# Patient Record
Sex: Male | Born: 1940
Health system: Southern US, Community
[De-identification: ages and names within clinical notes are randomized; demographics above are authoritative.]

## PROBLEM LIST (undated history)

## (undated) DIAGNOSIS — A159 Respiratory tuberculosis unspecified: Secondary | ICD-10-CM

## (undated) DIAGNOSIS — C801 Malignant (primary) neoplasm, unspecified: Secondary | ICD-10-CM

## (undated) DIAGNOSIS — I10 Essential (primary) hypertension: Secondary | ICD-10-CM

## (undated) DIAGNOSIS — I639 Cerebral infarction, unspecified: Secondary | ICD-10-CM

## (undated) DIAGNOSIS — M1990 Unspecified osteoarthritis, unspecified site: Secondary | ICD-10-CM

## (undated) DIAGNOSIS — I739 Peripheral vascular disease, unspecified: Secondary | ICD-10-CM

## (undated) DIAGNOSIS — B019 Varicella without complication: Secondary | ICD-10-CM

## (undated) DIAGNOSIS — F329 Major depressive disorder, single episode, unspecified: Secondary | ICD-10-CM

## (undated) DIAGNOSIS — I2510 Atherosclerotic heart disease of native coronary artery without angina pectoris: Secondary | ICD-10-CM

## (undated) DIAGNOSIS — A539 Syphilis, unspecified: Secondary | ICD-10-CM

## (undated) DIAGNOSIS — F32A Depression, unspecified: Secondary | ICD-10-CM

## (undated) DIAGNOSIS — E7800 Pure hypercholesterolemia, unspecified: Secondary | ICD-10-CM

## (undated) DIAGNOSIS — E559 Vitamin D deficiency, unspecified: Secondary | ICD-10-CM

## (undated) DIAGNOSIS — I729 Aneurysm of unspecified site: Secondary | ICD-10-CM

## (undated) DIAGNOSIS — G43909 Migraine, unspecified, not intractable, without status migrainosus: Secondary | ICD-10-CM

## (undated) DIAGNOSIS — F4310 Post-traumatic stress disorder, unspecified: Secondary | ICD-10-CM

## (undated) HISTORY — DX: Post-traumatic stress disorder, unspecified: F43.10

## (undated) HISTORY — DX: Major depressive disorder, single episode, unspecified: F32.9

## (undated) HISTORY — DX: Vitamin D deficiency, unspecified: E55.9

## (undated) HISTORY — DX: Migraine, unspecified, not intractable, without status migrainosus: G43.909

## (undated) HISTORY — PX: COLONOSCOPY: SHX5424

## (undated) HISTORY — DX: Unspecified osteoarthritis, unspecified site: M19.90

## (undated) HISTORY — DX: Depression, unspecified: F32.A

## (undated) HISTORY — DX: Varicella without complication: B01.9

## (undated) HISTORY — PX: BACK SURGERY: SHX140

## (undated) HISTORY — PX: HERNIA REPAIR: SHX51

## (undated) HISTORY — DX: Aneurysm of unspecified site: I72.9

## (undated) HISTORY — DX: Syphilis, unspecified: A53.9

## (undated) HISTORY — DX: Respiratory tuberculosis unspecified: A15.9

---

## 1975-08-26 DIAGNOSIS — M549 Dorsalgia, unspecified: Secondary | ICD-10-CM | POA: Insufficient documentation

## 1997-08-25 DIAGNOSIS — K219 Gastro-esophageal reflux disease without esophagitis: Secondary | ICD-10-CM | POA: Insufficient documentation

## 2002-04-27 ENCOUNTER — Emergency Department (HOSPITAL_COMMUNITY): Admission: EM | Admit: 2002-04-27 | Discharge: 2002-04-27 | Payer: Self-pay | Admitting: Emergency Medicine

## 2004-12-08 ENCOUNTER — Emergency Department (HOSPITAL_COMMUNITY): Admission: EM | Admit: 2004-12-08 | Discharge: 2004-12-08 | Payer: Self-pay | Admitting: Emergency Medicine

## 2004-12-19 ENCOUNTER — Ambulatory Visit: Payer: Self-pay | Admitting: Internal Medicine

## 2008-05-11 ENCOUNTER — Emergency Department (HOSPITAL_COMMUNITY): Admission: EM | Admit: 2008-05-11 | Discharge: 2008-05-11 | Payer: Self-pay | Admitting: Emergency Medicine

## 2009-11-13 ENCOUNTER — Encounter: Payer: Self-pay | Admitting: Family Medicine

## 2009-11-15 ENCOUNTER — Encounter: Payer: Self-pay | Admitting: Family Medicine

## 2009-11-19 ENCOUNTER — Ambulatory Visit: Payer: Self-pay | Admitting: Family Medicine

## 2009-11-19 ENCOUNTER — Ambulatory Visit (HOSPITAL_COMMUNITY): Admission: RE | Admit: 2009-11-19 | Discharge: 2009-11-19 | Payer: Self-pay | Admitting: Family Medicine

## 2009-11-19 DIAGNOSIS — M159 Polyosteoarthritis, unspecified: Secondary | ICD-10-CM | POA: Insufficient documentation

## 2009-11-19 DIAGNOSIS — I498 Other specified cardiac arrhythmias: Secondary | ICD-10-CM | POA: Insufficient documentation

## 2009-11-19 DIAGNOSIS — F32A Depression, unspecified: Secondary | ICD-10-CM | POA: Insufficient documentation

## 2009-11-19 DIAGNOSIS — F329 Major depressive disorder, single episode, unspecified: Secondary | ICD-10-CM

## 2009-11-19 DIAGNOSIS — I10 Essential (primary) hypertension: Secondary | ICD-10-CM | POA: Insufficient documentation

## 2009-11-19 DIAGNOSIS — F419 Anxiety disorder, unspecified: Secondary | ICD-10-CM | POA: Insufficient documentation

## 2009-11-19 DIAGNOSIS — Z8679 Personal history of other diseases of the circulatory system: Secondary | ICD-10-CM | POA: Insufficient documentation

## 2009-11-22 ENCOUNTER — Encounter: Payer: Self-pay | Admitting: Family Medicine

## 2009-12-12 ENCOUNTER — Encounter: Payer: Self-pay | Admitting: Family Medicine

## 2009-12-17 ENCOUNTER — Encounter: Payer: Self-pay | Admitting: Family Medicine

## 2009-12-17 ENCOUNTER — Ambulatory Visit: Payer: Self-pay | Admitting: Family Medicine

## 2009-12-17 LAB — CONVERTED CEMR LAB
BUN: 21 mg/dL (ref 6–23)
Calcium: 9.9 mg/dL (ref 8.4–10.5)
Chloride: 96 meq/L (ref 96–112)
Cholesterol: 219 mg/dL — ABNORMAL HIGH (ref 0–200)
Creatinine, Ser: 1.23 mg/dL (ref 0.40–1.50)
HDL: 40 mg/dL (ref >39)
LDL Cholesterol: 153 mg/dL — ABNORMAL HIGH (ref 0–99)
Triglycerides: 129 mg/dL (ref <150)

## 2009-12-24 ENCOUNTER — Encounter: Payer: Self-pay | Admitting: Family Medicine

## 2010-01-25 ENCOUNTER — Telehealth: Payer: Self-pay | Admitting: Family Medicine

## 2010-02-20 ENCOUNTER — Encounter: Payer: Self-pay | Admitting: Family Medicine

## 2010-06-11 ENCOUNTER — Encounter: Payer: Self-pay | Admitting: Family Medicine

## 2010-06-13 ENCOUNTER — Encounter: Payer: Self-pay | Admitting: Family Medicine

## 2010-06-14 ENCOUNTER — Ambulatory Visit: Payer: Self-pay | Admitting: Family Medicine

## 2010-06-14 DIAGNOSIS — F17200 Nicotine dependence, unspecified, uncomplicated: Secondary | ICD-10-CM | POA: Insufficient documentation

## 2010-06-20 ENCOUNTER — Telehealth: Payer: Self-pay | Admitting: *Deleted

## 2010-07-05 ENCOUNTER — Encounter: Payer: Self-pay | Admitting: Family Medicine

## 2010-09-14 ENCOUNTER — Encounter: Payer: Self-pay | Admitting: Orthopaedic Surgery

## 2010-09-17 ENCOUNTER — Encounter (INDEPENDENT_AMBULATORY_CARE_PROVIDER_SITE_OTHER): Payer: Self-pay | Admitting: *Deleted

## 2010-09-22 ENCOUNTER — Emergency Department (HOSPITAL_COMMUNITY)
Admission: EM | Admit: 2010-09-22 | Discharge: 2010-09-22 | Payer: Self-pay | Source: Home / Self Care | Admitting: Emergency Medicine

## 2010-09-24 NOTE — Miscellaneous (Signed)
  Clinical Lists Changes  Abd u/s:  No signs of AAA Colonoscopy (12/07/09) - adenomatous polyp, repeat colonoscopy in 3 years

## 2010-09-24 NOTE — Miscellaneous (Signed)
Summary: [facility]  Clinical Lists Changes called pt to make an appt. states he has one for tomorrow. forms to pcp chart box yo do at visit.Golden Circle RN  June 13, 2010 2:55 PM

## 2010-09-24 NOTE — Progress Notes (Signed)
Summary: refill  Phone Note Call from Patient Call back at Home Phone [phone]   Caller: Patient Summary of Call: needs another refill on Keflex for his mouth [facility]  Initial call taken by: De Nurse,  January 25, 2010 11:53 AM  Follow-up for Phone Call        to pcp Follow-up by: Golden Circle RN,  January 25, 2010 12:08 PM  Additional Follow-up for Phone Call Additional follow up Details #1::        He needs to have another office visit if he wants more antibiotics Additional Follow-up by: Angelena Sole MD,  January 28, 2010 8:50 AM    Additional Follow-up for Phone Call Additional follow up Details #2::    states he talked to the pharmacist & got suggestions for the pain & swelling. he is feeling better now. has not seen a dentist. has medicare. suggested calling many dentist until he finds one that take medicare. states he has done that. he will call back if he feels he needs the antibiotic again Follow-up by: Golden Circle RN,  January 28, 2010 9:04 AM

## 2010-09-24 NOTE — Letter (Signed)
Summary: Generic Letter  [facility]  [address]   [place], [place] [address]   Phone: [phone]  Fax: [phone]    12/24/2009  Roberto Wallace [address] [place], [place]  [address]  Dear [name],  Here is a copy of your lab results.  I think that your bad cholesterol is too high so we should start you on a cholesterol lowering medicine.  Please schedule an appointment to discuss.  Tests: (1) Basic Metabolic Panel (27253)   Sodium                    138 mEq/L                   135-145   Potassium                 4.0 mEq/L                   3.5-5.3   Chloride                  96 mEq/L                    96-112   CO2                       28 mEq/L                    19-32   Glucose              [H]  110 mg/dL                   66-44   BUN                       21 mg/dL                    0-34   Creatinine                1.23 mg/dL                  0.40-1.50   Calcium                   9.9 mg/dL                   7.4-25.9  Tests: (2) Lipid Profile (56387)   Cholesterol          [H]  219 mg/dL                   5-643     ATP III Classification:           < 200        mg/dL        Desirable          200 - 239     mg/dL        Borderline High          >= 240        mg/dL        High         Triglyceride              129 mg/dL                   <329  HDL Cholesterol           40 mg/dL                    >16   Total Chol/HDL Ratio      5.5 Ratio  VLDL Cholesterol (Calc)                             26 mg/dL                    1-09  LDL Cholesterol (Calc)                        [H]  153 mg/dL                   6-04           Total Cholesterol/HDL Ratio:CHD Risk                            Coronary Heart Disease Risk Table                                            Men       Women              1/2 Average Risk              3.4        3.3                  Average Risk              5.0        4.4              2 X Average Risk              9.6         7.1              3 X Average Risk             23.4       11.0     Use the calculated Patient Ratio above and the CHD Risk table      to determine the patient's CHD Risk.     ATP III Classification (LDL):           < 100        mg/dL         Optimal          100 - 129     mg/dL         Near or Above Optimal          130 - 159     mg/dL         Borderline High          160 - 189     mg/dL         High           > 190        mg/dL         Very High  Sincerely,   Angelena Sole MD  Appended Document: Generic Letter mailed.

## 2010-09-24 NOTE — Miscellaneous (Signed)
   Colonoscopy  Procedure date:  11/13/2009  Findings:      Results: Normal.    Complete Medication List: 1)  Naprosyn 500 Mg Tabs (Naproxen) .Marland Kitchen.. 1 tab twice a day 2)  Ranitidine Hcl 150 Mg Caps (Ranitidine hcl) .... I tab two times a day 3)  Acetaminophen 325 Mg Supp (Acetaminophen) .... 2 tabs every 8 hours as needed 4)  Chlorthalidone 25 Mg Tabs (Chlorthalidone) .... Take 1/2 tab once per day   Prevention & Chronic Care Immunizations   Influenza vaccine: Not documented    Tetanus booster: Not documented    Pneumococcal vaccine: Not documented    H. zoster vaccine: Not documented  Colorectal Screening   Hemoccult: Not documented    Colonoscopy: Results: Normal.   (11/13/2009)  Other Screening   PSA: Not documented   Smoking status: Not documented  Lipids   Total Cholesterol: Not documented   LDL: Not documented   LDL Direct: Not documented   HDL: Not documented   Triglycerides: Not documented

## 2010-09-24 NOTE — Progress Notes (Signed)
Summary: scooter store  Phone Note Other Incoming Call back at [phone]   Caller: [facility] Summary of Call: asking about paperwork for his scooter eval Initial call taken by: De Nurse,  June 20, 2010 9:57 AM  Follow-up for Phone Call        He doesn't need a scooter Follow-up by: Angelena Sole MD,  June 20, 2010 4:39 PM  Additional Follow-up for Phone Call Additional follow up Details #1::        Chrisitna informed of above, expressed understanding Additional Follow-up by: Garen Grams LPN,  June 21, 2010 10:47 AM

## 2010-09-24 NOTE — Miscellaneous (Signed)
  Clinical Lists Changes  Prior Notes from [place]  1. Ultrasound AAA exam:  no evidence of AAA 2. CXR: No active disease 3. CBC: 6.3>15.0<216 4. Creatinine: 0.9 5. Colonscopy: colon polyp negative for dysplasia - repeat in 3 years 6. Daily headaches: referred for brain MRI and Ophto exam 7. MDD: Citalopram 8. Nerve conduction studies: looking for radiculopathy were negative 9. OA of bilateral knees

## 2010-09-24 NOTE — Consult Note (Signed)
Summary: [facility]   Imported By: Bradly Bienenstock 11/29/2009 09:22:56  _____________________________________________________________________  External Attachment:    Type:   Image     Comment:   External Document

## 2010-09-24 NOTE — Assessment & Plan Note (Signed)
Summary: f/u,df   Vital Signs:  Patient profile:   70 year old male Height:      69.25 inches Weight:      113 pounds BMI:     16.63 Temp:     97.8 degrees F Pulse rate:   80 / minute BP sitting:   124 / 78  (left arm) Cuff size:   regular  Vitals Entered By: Tessie Fass CMA (June 14, 2010 1:45 PM) CC: F/U Is Patient Diabetic? No Pain Assessment Patient in pain? yes        CC:  F/U.  History of Present Illness: 1. HTN: He is taking his medicine as prescribed.  He doesn't check his blood pressure regularly.  ROS: denies chest pain, shortness of breath.  Endorses headaches.  2. Back pain:  Has been to see Dr. Noel Gerold [facility].   Their evaluation showed severe cervical spondylosis with chronic neck pain.  Significant left tricep weakness.  Mild lumbar spondylosis with moderate to severe back pain.  Profound hip flexor weakness.  Gait and balance disturbances.  Order MRI of the cervical spine to r/o myelopathy.  Order MRI of the lumbar spine.  He is following up with them in a couple of weeks.  3. Knee pain:  Continues to have knee pain.  Asking for a motorized scooter.  He is able to walk around with the use of a cane.  Would like something stronger for pain.  Right now he is only taking Ibuprofen and Tylenol.  4. Tobacco use:  Pt continues to smoke.  Not interested in quitting.  ROS: denies chronic cough  Habits & Providers  Alcohol-Tobacco-Diet     Tobacco Status: current     Tobacco Counseling: to quit use of tobacco products     Cigarette Packs/Day: 0.25  Current Medications (verified): 1)  Ibuprofen 400 Mg Tabs (Ibuprofen) .Marland Kitchen.. 1 Tab By Mouth Every 8 Hours As Needed For Pain 2)  Ranitidine Hcl 150 Mg Caps (Ranitidine Hcl) .... I Tab Two Times A Day 3)  Acetaminophen 325 Mg Supp (Acetaminophen) .... 2 Tabs Every 8 Hours As Needed 4)  Aspirin 81 Mg Tbec (Aspirin) .... Take 1 Tab By Mouth Daily 5)  Fluoxetine Hcl 10 Mg Caps (Fluoxetine  Hcl) .... Take 1 Tab By Mouth Daily 6)  Chlorthalidone 50 Mg Tabs (Chlorthalidone) .... 1/2 Tab By Mouth Daily For Blood Pressure 7)  Tramadol Hcl 50 Mg Tabs (Tramadol Hcl) .Marland Kitchen.. 1 Tab By Mouth Every 8 Hours As Needed For Pain  Allergies: No Known Drug Allergies  Past History:  Past Medical History: Reviewed history from 11/19/2009 and no changes required. 1. Depression - Hospitalized in 2004 2. Stroke  Social History: Reviewed history from 11/13/2009 and no changes required. Lives by himself.  High school graduate.   Married twice (divorce 1st wife and 2nd wife deceased).  Current smoker since 1955.  Drinks alcohol. Packs/Day:  0.25  Physical Exam  General:  Vitals reviewed.  No acute distress.  Thin appearing. Head:  normocephalic and atraumatic.   Eyes:  vision grossly intact, pupils equal, pupils round, and pupils reactive to light.  Fundoscopic exam benign Neck:  supple and no masses.   Lungs:  normal respiratory effort, normal breath sounds, no crackles, and no wheezes.   Heart:  normal rate, regular rhythm, and no murmur.   Abdomen:  soft, non-tender, normal bowel sounds, and no distention.   Msk:  Knees:  bilateral joint line tenderness Back: Lumbar  and cervical spine tenderness and decreased ROM  Able to walk without much difficulty with a cane.  Extremities:  no LE edema Neurologic:  alert & oriented X3.   Psych:  not anxious appearing and not depressed appearing.     Impression & Recommendations:  Problem # 1:  HYPERTENSION, BENIGN ESSENTIAL (ICD-401.1) Assessment Unchanged  At goal.  Continue current medications His updated medication list for this problem includes:    Chlorthalidone 50 Mg Tabs (Chlorthalidone) .Marland Kitchen... 1/2 tab by mouth daily for blood pressure  Orders: [facility]- Est  Level 4 (30865)  Problem # 2:  GEN OSTEOARTHROSIS INVOLVING MULTIPLE SITES (ICD-715.09) Assessment: Unchanged  Still having significant knee pain.  Will change from Ibuprofen to  Tramadol.  Continue taking the Tylenol two times a day. The following medications were removed from the medication list:    Ibuprofen 400 Mg Tabs (Ibuprofen) .Marland Kitchen... 1 tab by mouth every 8 hours as needed for pain His updated medication list for this problem includes:    Acetaminophen 325 Mg Supp (Acetaminophen) .Marland Kitchen... 2 tabs every 8 hours as needed    Aspirin 81 Mg Tbec (Aspirin) .Marland Kitchen... Take 1 tab by mouth daily    Tramadol Hcl 50 Mg Tabs (Tramadol hcl) .Marland Kitchen... 1 tab by mouth every 8 hours as needed for pain  Orders: [facility]- Est  Level 4 (78469)  Problem # 3:  TOBACCO USER (ICD-305.1) Assessment: Unchanged  Continues to use tobacco.  Advised to quit.  Orders: [facility]- Est  Level 4 (99214)  Complete Medication List: 1)  Ranitidine Hcl 150 Mg Caps (Ranitidine hcl) .... I tab two times a day 2)  Acetaminophen 325 Mg Supp (Acetaminophen) .... 2 tabs every 8 hours as needed 3)  Aspirin 81 Mg Tbec (Aspirin) .... Take 1 tab by mouth daily 4)  Fluoxetine Hcl 10 Mg Caps (Fluoxetine hcl) .... Take 1 tab by mouth daily 5)  Chlorthalidone 50 Mg Tabs (Chlorthalidone) .... 1/2 tab by mouth daily for blood pressure 6)  Tramadol Hcl 50 Mg Tabs (Tramadol hcl) .Marland Kitchen.. 1 tab by mouth every 8 hours as needed for pain  Patient Instructions: 1)  It was good to see you again today 2)  We will try and control your pain until you go back to see the Orthopedic Surgeon 3)  Please schedule a follow up appointment in 6 weeks to check on your pain Prescriptions: CHLORTHALIDONE 50 MG TABS (CHLORTHALIDONE) 1/2 tab by mouth daily for blood pressure  #30 x 3   Entered and Authorized by:   Angelena Sole MD   Signed by:   Angelena Sole MD on 06/14/2010   Method used:   Electronically to        [facility] [phone]* (retail)       [address]       [place], [place]  [address]       Ph: [phone]       Fax: [phone]   RxID:   [id number] FLUOXETINE HCL 10 MG CAPS (FLUOXETINE HCL) Take 1 tab by mouth daily   #30 x 3   Entered and Authorized by:   Angelena Sole MD   Signed by:   Angelena Sole MD on 06/14/2010   Method used:   Electronically to        [facility] [phone]* (retail)       [address]       [place], [place]  [address]       Ph: [phone]  Fax: [phone]   RxID:   [id number] RANITIDINE HCL 150 MG CAPS (RANITIDINE HCL) I tab two times a day  #60 x 3   Entered and Authorized by:   Angelena Sole MD   Signed by:   Angelena Sole MD on 06/14/2010   Method used:   Electronically to        [facility] [phone]* (retail)       [address]       [place], [place]  [address]       Ph: [phone]       Fax: [phone]   RxID:   [id number] TRAMADOL HCL 50 MG TABS (TRAMADOL HCL) 1 tab by mouth every 8 hours as needed for pain  #40 x 1   Entered and Authorized by:   Angelena Sole MD   Signed by:   Angelena Sole MD on 06/14/2010   Method used:   Electronically to        [facility] [phone]* (retail)       [address]       [place], [place]  [address]       Ph: [phone]       Fax: [phone]   RxID:   [id number]    Orders Added: 1)  [facility]- Est  Level 4 [37628]

## 2010-09-24 NOTE — Miscellaneous (Signed)
  Clinical Lists Changes  Received information from spine [facility] sc[facility]osis specialists stating:  Clinical assessment: Severe cervical spondylosis with chronic neck pain.  Significant left tricep weakness.  Mild lumbar spondylosis with moderate to severe back pain.  Profound hip flexor weakness.  Gait and balance disturbances.  Order MRI of the cervical spine to r/o myelopathy.  Order MRI of the lumbar spine.  Patient will participate in activities as tolerated using pain as their guide.  Patient was educated on their diagnosis and typical treatment algorithm.  Consider LSO.  Continue with OTC medications.  Questions encouraged and answered.  Instructed patient to call with any new question/concernes.  Patient will follow up after tests are completed with Dr. Caralee Ates MD  June 11, 2010 12:44 PM

## 2010-09-24 NOTE — Assessment & Plan Note (Signed)
Summary: NP,DF   Vital Signs:  Patient profile:   70 year old male Height:      69.25 inches Weight:      115 pounds BMI:     16.92 Pulse rate:   87 / minute BP sitting:   161 / 96  (left arm) Cuff size:   regular  Vitals Entered By: Tessie Fass CMA (November 19, 2009 10:23 AM) CC: new pt Is Patient Diabetic? No Pain Assessment Patient in pain? yes     Location: body ache Intensity: 10   CC:  new pt.  History of Present Illness: Medical Issues: 1. Osteoarthritis:  involving bilateral knees, low back, upper back, hands.  Walks with a cane. 2. ? Arrythmia:  diagnosed at [place] by EKG      ROS: denies shortness of breath, syncope 3. Vision changes:  having more problems seeing things as clear as they used to be 4. Headaches: occasional headaches 5. Depression:  Gets sad thinking about life at times and that he is lonely      ROS: denies SI 6. Hx of 3 small strokes:  No residual deficits 7. Chest pain:  Has chest pain that is brought on with exertion.  It is located in the left chest.  Improved with rest.  Has mentioned this to [facility] doctors.  Thinks that they are going to refer him to a Cardiologist.      ROS: denies active chest pain 8. Smoking:  Has been smoking 1 pack of cigarettes for the past couple of years.  Greater than 40 pack year history.  Is interested in quiting.  Habits & Providers  Alcohol-Tobacco-Diet     Tobacco Status: current     Cigarette Packs/Day: <0.25  Current Medications (verified): 1)  Naprosyn 500 Mg Tabs (Naproxen) .Marland Kitchen.. 1 Tab Twice A Day 2)  Ranitidine Hcl 150 Mg Caps (Ranitidine Hcl) .... I Tab Two Times A Day 3)  Acetaminophen 325 Mg Supp (Acetaminophen) .... 2 Tabs Every 8 Hours As Needed 4)  Chlorthalidone 25 Mg  Tabs (Chlorthalidone) .... Take 1/2 Tab Once Per Day  Past History:  Past Medical History: 1. Depression - Hospitalized in 2004 2. Stroke  Family History: Reviewed history from 11/13/2009 and no changes required. Father alive  at age 65: Healthy Mother died at age 49: Dementia Brother: prostate cancer  Social History: Reviewed history from 11/13/2009 and no changes required. Lives by himself.  High school graduate.   Married twice (divorce 1st wife and 2nd wife deceased).  Current smoker since 1955.  Drinks alcohol. Smoking Status:  current Packs/Day:  <0.25  Review of Systems       The patient complains of difficulty walking.  The patient denies fever, peripheral edema, abdominal pain, melena, muscle weakness, and transient blindness.    Physical Exam  General:  Vitals reviewed.  No acute distress.  Thin appearing. Head:  normocephalic and atraumatic.   Eyes:  vision grossly intact, pupils equal, pupils round, and pupils reactive to light.  Fundoscopic exam benign Mouth:  poor dentition.   Neck:  supple and no masses.   Lungs:  normal respiratory effort, normal breath sounds, no crackles, and no wheezes.   Heart:  normal rate, regular rhythm, and no murmur.   Abdomen:  soft, non-tender, normal bowel sounds, and no distention.   Msk:  Knees:  bilateral joint line tenderness Back: Lumbar and cervical spine tenderness and decreased ROM  Extremities:  no lower extremity edema Neurologic:  alert & oriented  X3.   Psych:  Not depressed or anxious appearing Additional Exam:  EKG: normal sinus rhythm, no ST-T wave changes   Impression & Recommendations:  Problem # 1:  HYPERTENSION, BENIGN ESSENTIAL (ICD-401.1) Assessment New Pt prefers for medical management be deferred to the [place] since it doesn't cost him anything there for medicines and referrals.  Recommended starting anti-hypertensive. The following medications were removed from the medication list:    Chlorthalidone 25 Mg Tabs (Chlorthalidone) .Marland Kitchen... Take 1/2 tab once per day  Orders: 12 Lead EKG (12 Lead EKG)  Problem # 2:  CHEST PAIN (ICD-786.50) Assessment: New Would refer to Cardiology.  [facility]  Will have him f/u  in 2 weeks if it has not been arranged will do it through our office.  Problem # 3:  GEN OSTEOARTHROSIS INVOLVING MULTIPLE SITES (ICD-715.09) Assessment: New  His updated medication list for this problem includes:    Naprosyn 500 Mg Tabs (Naproxen) .Marland Kitchen... 1 tab twice a day    Acetaminophen 325 Mg Supp (Acetaminophen) .Marland Kitchen... 2 tabs every 8 hours as needed  Problem # 4:  DEPRESSION (ICD-311) Assessment: New Would run formal depression screening.  Consider anti-depressant.  Problem # 5:  TOBACCO ABUSE (ICD-305.1) Assessment: New Interested in quiting.  Recommended Chantix.  Problem # 6:  VISUAL CHANGES (ICD-368.10) Assessment: New Would send to Ophthalmology  Problem # 7:  CEREBROVASCULAR ACCIDENT, HX OF (ICD-V12.50) Assessment: New Would check Lipids.  Start pt on Aspirin. Control BP better.  [facility]  Complete Medication List: 1)  Naprosyn 500 Mg Tabs (Naproxen) .Marland Kitchen.. 1 tab twice a day 2)  Ranitidine Hcl 150 Mg Caps (Ranitidine hcl) .... I tab two times a day 3)  Acetaminophen 325 Mg Supp (Acetaminophen) .... 2 tabs every 8 hours as needed  Patient Instructions: 1)  It was nice to meet you today 2)  I look forward to helping with your healthcare 3)  I can understand that you would like [facility] to [facility] to [facility] since it is cheaper for you 4)  There are a couple things that I think should be done 5)  You should be on a blood pressure medicine 6)  You should probably be on an anti-depressant 7)  I would like for you to be referred to a Cardiologist for evaluation of your chest pain 8)  Please schedule a follow up appointment in 2 weeks, so I can follow up on these issues

## 2010-09-24 NOTE — Miscellaneous (Signed)
  Clinical Lists Changes  Medications: Added new medication of NAPROSYN 500 MG TABS (NAPROXEN) 1 tab twice a day - Signed Added new medication of RANITIDINE HCL 150 MG CAPS (RANITIDINE HCL) I tab two times a day - Signed Added new medication of ACETAMINOPHEN 325 MG SUPP (ACETAMINOPHEN) 2 tabs every 8 hours as needed - Signed Added new medication of CHLORTHALIDONE 25 MG  TABS (CHLORTHALIDONE) Take 1/2 tab once per day - Signed Observations: Added new observation of DM PROGRESS: N/A (11/13/2009 17:09) Added new observation of DM FSREVIEW: N/A (11/13/2009 17:09) Added new observation of HTN PROGRESS: N/A (11/13/2009 17:09) Added new observation of HTN FSREVIEW: N/A (11/13/2009 17:09) Added new observation of LIPID PROGRS: N/A (11/13/2009 17:09) Added new observation of LIPID FSREVW: N/A (11/13/2009 17:09) Added new observation of FAMILY HX: Father alive at age 14: Mother died at age 26 Brother: prostate cancer (11/13/2009 17:09) Added new observation of SOCIAL HX: Lives by himself.  High school graduate.   Married twice (divorce 1st wife and 2nd wife deceased).  Current smoker since 1955.  Drinks alcohol.  (11/13/2009 17:09) Added new observation of PAST SURG HX: None (11/13/2009 17:09) Added new observation of PAST MED HX: 1. Depression - Hospitalized in 2004 2. Stroke (11/13/2009 17:09)        Past History:  Past Medical History: 1. Depression - Hospitalized in 2004 2. Stroke  Past Surgical History: None   Family History: Father alive at age 60: Mother died at age 3 Brother: prostate cancer  Social History: Lives by himself.  High school graduate.   Married twice (divorce 1st wife and 2nd wife deceased).  Current smoker since 1955.  Drinks alcohol.     Prevention & Chronic Care Immunizations   Influenza vaccine: Not documented    Tetanus booster: Not documented    Pneumococcal vaccine: Not documented    H. zoster vaccine: Not documented  Colorectal  Screening   Hemoccult: Not documented    Colonoscopy: Not documented  Other Screening   PSA: Not documented   Smoking status: Not documented  Lipids   Total Cholesterol: Not documented   LDL: Not documented   LDL Direct: Not documented   HDL: Not documented   Triglycerides: Not documented

## 2010-09-24 NOTE — Miscellaneous (Signed)
  Clinical Lists Changes  Problems: Removed problem of DENTAL PAIN (ICD-525.9) Removed problem of CHEST PAIN (ICD-786.50) Removed problem of VISUAL CHANGES (ICD-368.10) Removed problem of HEADACHE (ICD-784.0)

## 2010-09-24 NOTE — Assessment & Plan Note (Signed)
Summary: FU/KH   Vital Signs:  Patient profile:   70 year old male Weight:      106.8 pounds Temp:     98.3 degrees F oral Pulse rate:   103 / minute Pulse rhythm:   regular BP sitting:   120 / 81  (left arm) Cuff size:   regular  Vitals Entered By: Loralee Pacas CMA (December 17, 2009 10:38 AM) CC: HTN, Depression, Chest pain, Tooth pain   CC:  HTN, Depression, Chest pain, and Tooth pain.  History of Present Illness: 1.  Tooth pain:  Pt has had worsening tooth pain for the past week.  It is on the lower right sided molar that has already been broken off.  Now it is swollen, red, and tender.  It makes it difficult to eat.      ROS: denies fevers  2. HTN: Pt has been taking and tolerating his medicines as prescribed.  He doesn't check his blood pressure at home regularly.      ROS: denies shortness of breath, headache  3. Chest Pain:  Hasn't had any chest pain for the past couple of weeks  4. Hx of CVA: Is not taking Aspirin or Plavix.  Continues to smoke.      ROS: denies any numbness / weakness  5. Depression:  His mood is very low and depressed.  Has been like that for years.  Is not interested in talking with a Psychologist at this time.      ROS: endorses anhedonia, problems sleeping, decreased energy.  Denies SI.  Current Medications (verified): 1)  Naprosyn 500 Mg Tabs (Naproxen) .Marland Kitchen.. 1 Tab Twice A Day 2)  Ranitidine Hcl 150 Mg Caps (Ranitidine Hcl) .... I Tab Two Times A Day 3)  Acetaminophen 325 Mg Supp (Acetaminophen) .... 2 Tabs Every 8 Hours As Needed 4)  Keflex 500 Mg Caps (Cephalexin) .... Take 1 Tab By Mouth Twice A Day For 10 Days 5)  Aspirin 81 Mg Tbec (Aspirin) .... Take 1 Tab By Mouth Daily 6)  Fluoxetine Hcl 10 Mg Caps (Fluoxetine Hcl) .... Take 1 Tab By Mouth Daily  Allergies (verified): No Known Drug Allergies  Past History:  Past Medical History: Reviewed history from 11/19/2009 and no changes required. 1. Depression - Hospitalized in 2004 2.  Stroke  Social History: Reviewed history from 11/13/2009 and no changes required. Lives by himself.  High school graduate.   Married twice (divorce 1st wife and 2nd wife deceased).  Current smoker since 1955.  Drinks alcohol.   Physical Exam  General:  Vitals reviewed.  No acute distress.  Thin appearing. Head:  normocephalic and atraumatic.   Eyes:  vision grossly intact, pupils equal, pupils round, and pupils reactive to light.  Fundoscopic exam benign Mouth:  poor dentition.  Right bottom molar is broken off and appears to have a superficial infection.  Multiple other broken teeth.   Neck:  supple and no masses.   Lungs:  normal respiratory effort, normal breath sounds, no crackles, and no wheezes.   Heart:  normal rate, regular rhythm, and no murmur.   Abdomen:  soft, non-tender, normal bowel sounds, and no distention.   Extremities:  no lower extremity edema Neurologic:  alert & oriented X3.   Psych:  normally interactive and not depressed appearing.     Impression & Recommendations:  Problem # 1:  DENTAL PAIN (ICD-525.9) Assessment New  Has broken tooth with infection.  Will treat with Keflex.  Pain control with Naproxen  and Tylenol.  Orders: [facility]- Est  Level 4 (04540)  Problem # 2:  CHEST PAIN (ICD-786.50) Assessment: Improved  If recurrs would consider sending to Cardiology.  Will risk stratify with Lipids.  Orders: [facility]- Est  Level 4 (98119)  Problem # 3:  DEPRESSION (ICD-311) Assessment: Unchanged  Start antidepressant.  He was hesistant to talk with Dr. Pascal Lux will encourage again at next visit. His updated medication list for this problem includes:    Fluoxetine Hcl 10 Mg Caps (Fluoxetine hcl) .Marland Kitchen... Take 1 tab by mouth daily  Orders: [facility]- Est  Level 4 (14782)  Problem # 4:  CEREBROVASCULAR ACCIDENT, HX OF (ICD-V12.50) Assessment: Unchanged  Start Aspirin  Orders: [facility]- Est  Level 4 (95621)  Problem # 5:  HYPERTENSION, BENIGN ESSENTIAL  (ICD-401.1) Assessment: Improved Continue current meds.  Check BMET. Orders: T-Basic Metabolic Panel [phone]) [facility]- Est  Level 4 (99214)  Complete Medication List: 1)  Naprosyn 500 Mg Tabs (Naproxen) .Marland Kitchen.. 1 tab twice a day 2)  Ranitidine Hcl 150 Mg Caps (Ranitidine hcl) .... I tab two times a day 3)  Acetaminophen 325 Mg Supp (Acetaminophen) .... 2 tabs every 8 hours as needed 4)  Keflex 500 Mg Caps (Cephalexin) .... Take 1 tab by mouth twice a day for 10 days 5)  Aspirin 81 Mg Tbec (Aspirin) .... Take 1 tab by mouth daily 6)  Fluoxetine Hcl 10 Mg Caps (Fluoxetine hcl) .... Take 1 tab by mouth daily  Other Orders: T-Lipid Profile ([id number])  Patient Instructions: 1)  I am glad to start taking over more of your health care 2)  I am going to make a couple changes today 3)  I would like for you to start taking an Aspirin everyday since you have a history of having small strokes 4)  I am going to start you on an antidepressant for your mood.  Let me know when you would be interested in talking with our Psychologist.  I think that it would be good for you 5)  I will refer you to a dentist.  In the meantime, I would like for you to take an antibiotic because I think that your tooth may be infected. 6)  Please schedule a follow up appointment in 6 weeks Prescriptions: FLUOXETINE HCL 10 MG CAPS (FLUOXETINE HCL) Take 1 tab by mouth daily  #30 x 3   Entered and Authorized by:   Angelena Sole MD   Signed by:   Angelena Sole MD on 12/17/2009   Method used:   Print then Give to Patient   RxID:   [id number] ASPIRIN 81 MG TBEC (ASPIRIN) Take 1 tab by mouth daily  #30 x 6   Entered and Authorized by:   Angelena Sole MD   Signed by:   Angelena Sole MD on 12/17/2009   Method used:   Print then Give to Patient   RxID:   [id number] KEFLEX 500 MG CAPS (CEPHALEXIN) Take 1 tab by mouth twice a day for 10 days  #20 x 0   Entered and Authorized by:   Angelena Sole MD    Signed by:   Angelena Sole MD on 12/17/2009   Method used:   Print then Give to Patient   RxID:   [id number]   Prevention & Chronic Care Immunizations   Influenza vaccine: Not documented    Tetanus booster: Not documented    Pneumococcal vaccine: Not documented    H. zoster vaccine: Not documented  Colorectal Screening   Hemoccult: Not  documented    Colonoscopy: Results: Normal.   (11/13/2009)  Other Screening   PSA: Not documented   Smoking status: current  (11/19/2009)   Smoking cessation counseling: yes  (12/17/2009)  Lipids   Total Cholesterol: Not documented   Lipid panel action/deferral: Lipid Panel ordered   LDL: Not documented   LDL Direct: Not documented   HDL: Not documented   Triglycerides: Not documented  Hypertension   Last Blood Pressure: 120 / 81  (12/17/2009)   Serum creatinine: Not documented   BMP action: Ordered   Serum potassium Not documented    Hypertension flowsheet reviewed?: Yes   Progress toward BP goal: Improved  Self-Management Support :   Personal Goals (by the next clinic visit) :      Personal blood pressure goal: 140/90  (12/17/2009)   Hypertension self-management support: Not documented

## 2010-09-26 NOTE — Letter (Signed)
Summary: Generic Letter  [facility]  [address]   [place], [place] [address]   Phone: [phone]  Fax: [phone]    09/17/2010  [address] [place], [place]  [address]  Dear Roberto Wallace,  We are happy to let you know that since you are covered under Medicare you are able to have a FREE visit at the [facility] to discuss your HEALTH. This is a new benefit for Medicare.  There will be no co-payment.  At this visit you will meet with Arlys John an expert in wellness and the health coach at our clinic.  At this visit we will discuss ways to keep you healthy and feeling well.  This visit will not replace your regular doctor visit and we cannot refill medications.     You will need to plan to be here at least one hour to talk about your medical history, your current status, review all of your medications, and discuss your future plans for your health.  This information will be entered into your record for your doctor to have and review.  If you are interested in staying healthy, this type of visit can help.  Please call the office at: [phone], to schedule a "Medicare Wellness Visit".  The day of the visit you should bring in all of your medications, including any vitamins, herbs, over the counter products you take.  Make a list of all the other doctors that you see, so we know who they are. If you have any other health documents please bring them.  We look forward to helping you stay healthy.  Sincerely,   [facility]  iAWV

## 2011-05-26 LAB — CBC
HCT: 39.8
Hemoglobin: 13.2
MCV: 88.7
Platelets: 221
RDW: 13.4

## 2011-05-26 LAB — RAPID URINE DRUG SCREEN, HOSP PERFORMED
Amphetamines: NOT DETECTED
Barbiturates: NOT DETECTED
Benzodiazepines: NOT DETECTED

## 2011-05-26 LAB — BASIC METABOLIC PANEL
BUN: 11
CO2: 27
Chloride: 98
Glucose, Bld: 102 — ABNORMAL HIGH
Potassium: 4
Sodium: 135

## 2011-05-26 LAB — URINALYSIS, ROUTINE W REFLEX MICROSCOPIC
Glucose, UA: NEGATIVE
Ketones, ur: 15 — AB
Nitrite: NEGATIVE
Protein, ur: NEGATIVE
pH: 6

## 2011-05-26 LAB — URINE MICROSCOPIC-ADD ON

## 2011-05-26 LAB — DIFFERENTIAL
Basophils Absolute: 0
Eosinophils Absolute: 0.4
Eosinophils Relative: 6 — ABNORMAL HIGH
Lymphocytes Relative: 17
Monocytes Absolute: 0.4

## 2011-05-26 LAB — TRICYCLICS SCREEN, URINE: TCA Scrn: NOT DETECTED

## 2011-10-08 ENCOUNTER — Other Ambulatory Visit: Payer: Self-pay | Admitting: Orthopaedic Surgery

## 2011-10-08 DIAGNOSIS — M542 Cervicalgia: Secondary | ICD-10-CM

## 2011-10-08 DIAGNOSIS — M545 Low back pain, unspecified: Secondary | ICD-10-CM

## 2011-10-20 ENCOUNTER — Ambulatory Visit
Admission: RE | Admit: 2011-10-20 | Discharge: 2011-10-20 | Disposition: A | Discharge status: Home / Self Care | Payer: Medicare PPO | Source: Ambulatory Visit | Attending: Orthopaedic Surgery | Admitting: Orthopaedic Surgery

## 2011-10-20 DIAGNOSIS — M545 Low back pain, unspecified: Secondary | ICD-10-CM

## 2011-10-20 DIAGNOSIS — M542 Cervicalgia: Secondary | ICD-10-CM

## 2014-02-13 ENCOUNTER — Encounter (HOSPITAL_COMMUNITY): Payer: Self-pay | Admitting: Emergency Medicine

## 2014-02-13 ENCOUNTER — Emergency Department (INDEPENDENT_AMBULATORY_CARE_PROVIDER_SITE_OTHER)
Admission: EM | Admit: 2014-02-13 | Discharge: 2014-02-13 | Disposition: A | Discharge status: Home / Self Care | Payer: Medicare HMO | Source: Home / Self Care | Attending: Emergency Medicine | Admitting: Emergency Medicine

## 2014-02-13 DIAGNOSIS — K0510 Chronic gingivitis, plaque induced: Secondary | ICD-10-CM

## 2014-02-13 DIAGNOSIS — M25561 Pain in right knee: Secondary | ICD-10-CM

## 2014-02-13 DIAGNOSIS — M545 Low back pain, unspecified: Secondary | ICD-10-CM

## 2014-02-13 DIAGNOSIS — M549 Dorsalgia, unspecified: Secondary | ICD-10-CM

## 2014-02-13 DIAGNOSIS — M25562 Pain in left knee: Secondary | ICD-10-CM

## 2014-02-13 DIAGNOSIS — I10 Essential (primary) hypertension: Secondary | ICD-10-CM

## 2014-02-13 DIAGNOSIS — M25569 Pain in unspecified knee: Secondary | ICD-10-CM

## 2014-02-13 MED ORDER — CHLORHEXIDINE GLUCONATE 0.12 % MT SOLN: 15.0000 mL | Freq: Two times a day (BID) | OROMUCOSAL | Status: DC

## 2014-02-13 MED ORDER — CLINDAMYCIN HCL 300 MG PO CAPS: 300.0000 mg | ORAL_CAPSULE | Freq: Four times a day (QID) | ORAL | Status: DC

## 2014-02-13 NOTE — ED Provider Notes (Signed)
Chief Complaint   Chief Complaint  Patient presents with  . Knee Pain  . Dental Pain    History of Present Illness   Roberto Wallace is a 73 year old male who was referred here by his insurance company for a "dental exam." The patient states she's had a long-standing history of pain and bleeding of his gums. He has a few teeth in his mouth and poor repair, and are all decayed and loose. He also has multiple other complaints and problems. He does not have a primary care physician. He's had upper and lower back pain due to ruptured disc since 1977. He's taking gabapentin which is [facility], but he wants her primary care doctor here in town. He's had hypertension since 1989 and is on medication for that. He was also told that he had dementia but is not taking any medicine for that. He's had bilateral knee pain.  Review of Systems     Other than as noted above, the patient denies any of the following symptoms: Systemic:  No fever, chills, fatigue, myalgias, headache, or anorexia. Eye:  No redness, pain or drainage. ENT:  No earache, nasal congestion, rhinorrhea, sinus pressure, or sore throat. Lungs:  No cough, sputum production, wheezing, shortness of breath.  Cardiovascular:  No chest pain, palpitations, or syncope. GI:  No nausea, vomiting, abdominal pain or diarrhea. GU:  No dysuria, frequency, or hematuria. Skin:  No rash or pruritis.   PMFSH     Past medical history, family history, social history, meds, and allergies were reviewed.  Current meds include aspirin, or Thalitone, Prozac, Zantac, and tramadol.  Physical Examination    Vital signs:  BP 111/74  Pulse 112  Temp(Src) 98.3 F (36.8 C) (Oral)  Resp 16  SpO2 100% General:  Alert, in no distress. Eye:  PERRL, full EOMs.  Lids and conjunctivas were normal. ENT:  TMs and canals were normal, without erythema or inflammation.  Nasal mucosa was clear and uncongested, without drainage.  Mucous membranes  were moist.  Pharynx was clear, without exudate or drainage.  There were no oral ulcerations or lesions. Teeth were in very poor repair. Gingiva was swollen and tender to touch. There was no bleeding or purulent drainage. Neck:  Supple, no adenopathy, tenderness or mass. Thyroid was normal. Lungs:  No respiratory distress.  Lungs were clear to auscultation, without wheezes, rales or rhonchi.  Breath sounds were clear and equal bilaterally. Heart:  Regular rhythm, without gallops, murmers or rubs. Abdomen:  Soft, flat, and non-tender to palpation.  No hepatosplenomagaly or mass. Skin:  Clear, warm, and dry, without rash or lesions.  Assessment   The primary encounter diagnosis was Gingivitis. Diagnoses of Midline low back pain without sciatica, Upper back pain, HYPERTENSION, BENIGN ESSENTIAL, and Arthralgia of both knees were also pertinent to this visit.  He will need to see a dentist and primary care doctor as soon as possible.  Plan     1.  Meds:  The following meds were prescribed:   Discharge Medication List as of 02/13/2014  1:46 PM    START taking these medications   Details  chlorhexidine (PERIDEX) 0.12 % solution Use as directed 15 mLs in the mouth or throat 2 (two) times daily., Starting 02/13/2014, Until Discontinued, Normal    clindamycin (CLEOCIN) 300 MG capsule Take 1 capsule (300 mg total) by mouth 4 (four) times daily., Starting 02/13/2014, Until Discontinued, Normal        2.  Patient  Education/Counseling:  The patient was given appropriate handouts, self care instructions, and instructed in symptomatic relief.    3.  Follow up:  The patient was told to follow up here if no better in 3 to 4 days, or sooner if becoming worse in any way, and given some red flag symptoms such as difficulty swallowing or breathing which would prompt immediate return.  Follow up with the [facility], and he was also given the names of several dentist who might except his  insurance, it looks like she'll need to have all his teeth pulled, and be fitted with dentures.        Reuben Likesavid C Keller, MD 02/13/14 [phone]

## 2014-02-13 NOTE — Discharge Instructions (Signed)
Go to www.goodrx.com to look up your medications. This will give you a list of where you can find your prescriptions at the most affordable prices.   RESOURCE GUIDE  Dental Problems  Look up DebtSupply.plhttp://www.ncdental.org/ncds/Schedule.asp for a schedule of the [employer] [facility] [employer]'s free dental clinics called [address] [place] [facility]. They have clinics all around [place]. Get there early and be prepared to wait.   [facility] [address]  [place], [place] [address] [phone]  [facility] [address] [place], [place] [phone]  Patients with Medicaid: [facility]                     [facility] [phone]                                [phone] Phone:  [phone]                                                  Phone:  [phone]  If unable to pay or uninsured, contact:  [facility] or [facility] to become qualified for the adult dental clinic.   Look up the [facility]'s [facility] for free dental clinics. https://www.williams-[email or web address]://www.[email or web address]/ncds/Schedule.asp  Get there early and be prepared to wait. [employer] and [employer] have Armed forces operational officerdental hygienist schools that provide low cost routine dental care.   Other resources: [facility] [address] , [place] [phone]  Patients with Medicaid: [facility]                     [facility] [phone]                                [phone] Phone:  [phone]                                                  Phone:  [phone]  Dr. Renee RivalJanice Civils [address] [phone]  If unable to pay or uninsured, contact:  [facility] or [facility] to become qualified for the adult dental clinic.  No matter what dental problem you have, it will not get better unless you get good dental care.  If the tooth is not taken care of, your  symptoms will come back in time and you will be visiting us again in the Urgent Care Center with a bad toothache.  So, see your dentist as soon as possible.  If you don't have a dentist, we can give you a list of dentists.  Sometimes the most cost effective treatment is removal of the tooth.  This can be done very inexpensively through one of the low cost [facility] such as the facility on [address] in [place] [phone]).  The downside to this is that you will have one less tooth and this can effect your ability to chew.  Some other things that can be done for a  dental infection include the following:   Rinse your mouth out with hot salt water (1/2 tsp of table salt and a pinch of baking soda in 8 oz of hot water).  You can do this every 2 or 3 hours.  Avoid cold foods, beverages, and cold air.  This will make your symptoms worse.  Sleep with your head elevated.  Sleeping flat will cause your gums and oral tissues to swell and make them hurt more.  You can sleep on several pillows.  Even better is to sleep in a recliner with your head higher than your heart.  For mild to moderate pain, you can take Tylenol, ibuprofen, or Aleve.  External application of heat by a heating pad, hot water bottle, or hot wet towel can help with pain and speed healing.  You can do this every 2 to 3 hours. Do not fall asleep on a heating pad since this can cause a burn.    Do exercises twice daily followed by moist heat for 15 minutes.      Try to be as active as possible.  If no better in 2 weeks, follow up with orthopedist.    Knee pain can be caused by many conditions:  Osteoarthritis, gout, bursitis, tendonitis, cartiledge damage, condromalacia patella, patellofemoral syndrome, and ligament sprain to name just a few.  Often some simple conservative measures can help alleviate the pain.  Do not do the following:  Avoid squatting and doing deep knee bends.  This puts too much of  load on your cartiledges and tendons.  If you do a knee bend, go only half way down, flexing your knee no more than 90 degrees.  Do the following:  If you are overweight or obese, lose weight.  This makes for a lot less load on your knee joints.  If you use tobacco, quit.  Nicotine causes spasm of the small arteries, decreases blood flow, and impairs your body's normal ability to repair damage.  If your knee is acutely inflamed, use the principles of RICE (rest, ice, compression, and elevation).  Wearing a knee brace can help.  These are usually made of neoprene and can be purchased over the counter at the drug store.  Use of over the counter pain meds can be of help.  Tylenol (or acetaminophen) is the safest to use.  It often helps to take this regularly.  You can take up to 2 325 mg tablets 5 times daily, but it best to start out much lower that that, perhaps 2 325 mg tablets twice daily, then increase from there. People who are on the blood thinner warfarin have to be careful about taking high doses of Tylenol.  For people who are able to tolerate them, ibuprofen and naproxyn can also help with the pain.  You should discuss these agents with your physician before taking them.  People with chronic kidney disease, hypertension, peptic ulcer disease, and reflux can suffer adverse side effects. They should not be taken with warfarin. The maximum dosage of ibuprofen is 800 mg 3 times daily with meals.  The maximum dosage of naprosyn is 2 and 1/2 tablets twice daily with food, but again, start out low and gradually increase the dose until adequate pain relief is achieved. Ibuprofen and naprosyn should always be taken with food.  People with cartiledge injury or osteoarthritis may find glucosamine to be helpful.  This is an over-the-counter supplement that helps nourish and repair cartiledge.  The dose is 500 mg  3 times daily or 1500 mg taken in a single dose. This can take several months to work and it  doesn't always work.    For people with knee pain on just one side, use of a cane held in the hand on the same side as the knee pain takes some of the stress off the knee joint and can make a big difference in knee pain.  Wearing good shoes with adequate arch support is essential.  Regular exercise is of utmost importance.  Swimming, water aerobics, or use of an elliptical exerciser put the least stress on the knees of any exercise.  Finally doing the exercises below can be very helpful.  They tend to strengthen the muscles around the knee and provide extra support and stability.  Try to do them twice a day followed by ice for 10 minutes.

## 2014-02-13 NOTE — ED Notes (Signed)
C/o bilateral knee pain denies any known injury.  Also having dental pain.  Pt is requesting referral for a Dental office.   No relief in pain with otc meds.

## 2014-04-21 ENCOUNTER — Emergency Department (HOSPITAL_COMMUNITY)
Admission: EM | Admit: 2014-04-21 | Discharge: 2014-04-21 | Disposition: A | Discharge status: Home / Self Care | Payer: Medicare HMO | Attending: Emergency Medicine | Admitting: Emergency Medicine

## 2014-04-21 ENCOUNTER — Encounter (HOSPITAL_COMMUNITY): Payer: Self-pay | Admitting: Emergency Medicine

## 2014-04-21 DIAGNOSIS — K068 Other specified disorders of gingiva and edentulous alveolar ridge: Secondary | ICD-10-CM

## 2014-04-21 DIAGNOSIS — Z9889 Other specified postprocedural states: Secondary | ICD-10-CM

## 2014-04-21 DIAGNOSIS — Z79899 Other long term (current) drug therapy: Secondary | ICD-10-CM | POA: Insufficient documentation

## 2014-04-21 DIAGNOSIS — Z792 Long term (current) use of antibiotics: Secondary | ICD-10-CM | POA: Insufficient documentation

## 2014-04-21 DIAGNOSIS — F17200 Nicotine dependence, unspecified, uncomplicated: Secondary | ICD-10-CM | POA: Diagnosis not present

## 2014-04-21 DIAGNOSIS — Z7982 Long term (current) use of aspirin: Secondary | ICD-10-CM | POA: Diagnosis not present

## 2014-04-21 DIAGNOSIS — K055 Other periodontal diseases: Secondary | ICD-10-CM | POA: Diagnosis not present

## 2014-04-21 DIAGNOSIS — R42 Dizziness and giddiness: Secondary | ICD-10-CM | POA: Insufficient documentation

## 2014-04-21 LAB — CBC WITH DIFFERENTIAL/PLATELET
BASOS ABS: 0 10*3/uL (ref 0.0–0.1)
BASOS PCT: 0 % (ref 0–1)
Eosinophils Absolute: 0.4 10*3/uL (ref 0.0–0.7)
Eosinophils Relative: 4 % (ref 0–5)
HCT: 41.2 % (ref 39.0–52.0)
HEMOGLOBIN: 13.7 g/dL (ref 13.0–17.0)
Lymphocytes Relative: 16 % (ref 12–46)
Lymphs Abs: 1.6 10*3/uL (ref 0.7–4.0)
MCH: 29.5 pg (ref 26.0–34.0)
MCHC: 33.3 g/dL (ref 30.0–36.0)
MCV: 88.8 fL (ref 78.0–100.0)
Monocytes Absolute: 0.7 10*3/uL (ref 0.1–1.0)
Monocytes Relative: 7 % (ref 3–12)
NEUTROS ABS: 7.3 10*3/uL (ref 1.7–7.7)
NEUTROS PCT: 73 % (ref 43–77)
PLATELETS: 207 10*3/uL (ref 150–400)
RBC: 4.64 MIL/uL (ref 4.22–5.81)
RDW: 14.4 % (ref 11.5–15.5)
WBC: 10 10*3/uL (ref 4.0–10.5)

## 2014-04-21 MED ORDER — NONFORMULARY OR COMPOUNDED ITEM
5.0000 mL | Freq: Four times a day (QID) | Status: DC
  Administered 2014-04-21: 5 mL via ORAL
  Filled 2014-04-21 (×3): qty 1

## 2014-04-21 NOTE — ED Provider Notes (Signed)
Medical screening examination/treatment/procedure(s) were conducted as a shared visit with non-physician practitioner(s) and myself.  I personally evaluated the patient during the encounter.   EKG Interpretation None       Zamoria Boss, MD 04/21/14 1633 

## 2014-04-21 NOTE — ED Notes (Signed)
Per [facility], pt went to free standing dental clinic and had all his teeth pulled. Pt hasnt stopped bleeding.  Pt denies taking any anticoagulants.

## 2014-04-21 NOTE — ED Provider Notes (Signed)
Patient with oozing from multiple sockets where he had teeth pulled earlier today at a free dental clinic patient alert nontoxic. Patient has multiple dental sockets bruising slight amount of blood  Doug Sou, MD 04/21/14 1552

## 2014-04-21 NOTE — ED Provider Notes (Signed)
CSN: [id number]     Arrival date & time 04/21/14  1134 History   First MD Initiated Contact with Patient 04/21/14 1242     Chief Complaint  Patient presents with  . gum bleeding      (Consider location/radiation/quality/duration/timing/severity/associated sxs/prior Treatment) HPI Comments: The patient is a 73 year old male presenting to emergency room chief complaint of  Persistent gum bleeding since this morning. The patient reports he went to a free dental clinic where he had the remaining teeth pulled. Unknown amount of teeth, approximately 20. He reports he was told to contact 911 if bleeding. He reports lightheadedness, denies syncope, shortness of breath, chest pain. Denies anticoagulation use, ASA. He denies nausea or vomiting.  The history is provided by the patient. No language interpreter was used.    History reviewed. No pertinent past medical history. History reviewed. No pertinent past surgical history. No family history on file. History  Substance Use Topics  . Smoking status: Current Every Day Smoker    Types: Cigarettes  . Smokeless tobacco: Not on file  . Alcohol Use: Yes     Comment: social     Review of Systems  HENT: Positive for dental problem.   Respiratory: Negative for shortness of breath.   Cardiovascular: Negative for chest pain.  Gastrointestinal: Negative for nausea, vomiting and abdominal pain.  Neurological: Positive for light-headedness. Negative for syncope, weakness and numbness.      Allergies  Review of patient's allergies indicates no known allergies.  Home Medications   Prior to Admission medications   Medication Sig Start Date End Date Taking? Authorizing Provider  acetaminophen (TYLENOL) 325 MG tablet Take 650 mg by mouth every 8 (eight) hours as needed.      Historical Provider, MD  aspirin 81 MG tablet Take 81 mg by mouth daily.      Historical Provider, MD  chlorhexidine (PERIDEX) 0.12 % solution Use as directed 15 mLs in the  mouth or throat 2 (two) times daily. 02/13/14   Reuben Likes, MD  chlorthalidone (HYGROTON) 50 MG tablet Take 25 mg by mouth daily.      Historical Provider, MD  clindamycin (CLEOCIN) 300 MG capsule Take 1 capsule (300 mg total) by mouth 4 (four) times daily. 02/13/14   Reuben Likes, MD  FLUoxetine (PROZAC) 10 MG capsule Take 10 mg by mouth daily.      Historical Provider, MD  ranitidine (ZANTAC) 150 MG capsule Take 150 mg by mouth 2 (two) times daily.      Historical Provider, MD  traMADol (ULTRAM) 50 MG tablet Take 50 mg by mouth every 8 (eight) hours as needed for moderate pain.     Historical Provider, MD   BP 151/94  Pulse 91  Resp 16  SpO2 99% Physical Exam  Nursing note and vitals reviewed. Constitutional: He appears well-developed and well-nourished.  Non-toxic appearance. He does not have a sickly appearance. He does not appear ill. No distress.  HENT:  Head: Normocephalic and atraumatic.  Mouth/Throat: Uvula is midline. Abnormal dentition.  Edentulous, 10 oozing holes upper gum line 10 oozing holes to lower gum line.  Neck: Neck supple.  Cardiovascular: Normal rate and regular rhythm.   Pulmonary/Chest: Effort normal. He has no wheezes.  Neurological: He is alert.  Skin: Skin is warm and dry. He is not diaphoretic.  Psychiatric: He has a normal mood and affect. His behavior is normal.    ED Course  Procedures (including critical care time) Labs Review Results for orders  placed during the hospital encounter of 04/21/14  CBC WITH DIFFERENTIAL      Result Value Ref Range   WBC 10.0  4.0 - 10.5 K/uL   RBC 4.64  4.22 - 5.81 MIL/uL   Hemoglobin 13.7  13.0 - 17.0 g/dL   HCT 40.9  81.1 - 91.4 %   MCV 88.8  78.0 - 100.0 fL   MCH 29.5  26.0 - 34.0 pg   MCHC 33.3  30.0 - 36.0 g/dL   RDW 78.2  95.6 - 21.3 %   Platelets 207  150 - 400 K/uL   Neutrophils Relative % 73  43 - 77 %   Neutro Abs 7.3  1.7 - 7.7 K/uL   Lymphocytes Relative 16  12 - 46 %   Lymphs Abs 1.6  0.7 -  4.0 K/uL   Monocytes Relative 7  3 - 12 %   Monocytes Absolute 0.7  0.1 - 1.0 K/uL   Eosinophils Relative 4  0 - 5 %   Eosinophils Absolute 0.4  0.0 - 0.7 K/uL   Basophils Relative 0  0 - 1 %   Basophils Absolute 0.0  0.0 - 0.1 K/uL    Imaging Review No results found.   EKG Interpretation None      MDM   Final diagnoses:  Bleeding gums  History of recent dental procedure   Patient presents with multiple oozing sockets in upper and lower, after multiple extractions. CBC without sign of anemia. Plan to treat with pressure and reevaluate. Reevaluation patient reports persistent oozing. Dr. Ethelda Chick also evaluated the patient during this encounter. Patient reports moderate improvement with tranaxemic acid and gauze. Mild oozing from bottom right to minutes and bottom left. Moderate improvement 2 rest of gum line. Plan to discharge home with mouth wash and pressure, followup with dentist.  Meds given in ED:  Medications  tranaxemic acid 5% mouth wash (5 mLs Oral Given 04/21/14 1443)    New Prescriptions   No medications on file       Mellody Drown, PA-C 04/21/14 1630

## 2014-04-21 NOTE — Discharge Instructions (Signed)
Use your tranaxemic acid 5% mouth wash 4 times a day.  Swish in mouth, hold in mouth for 2 minutes. Call a dentist for further evaluation of your dental bleeding. Continue to use gause and hold pressure to limit bleeding. Eat a soft diet until your mouth has healed from the extractions. Call for a follow up appointment with a Family or Primary Care Provider.  Return if Symptoms worsen.   Take medication as prescribed.

## 2014-04-21 NOTE — ED Notes (Signed)
Gave pt rolled gauze to bite down on to help stop bleeding.

## 2014-04-21 NOTE — ED Notes (Signed)
Bed: ZO10 Expected date:  Expected time:  Means of arrival:  Comments: EMS Bleeding post.-teeth extraction.

## 2014-04-24 MED FILL — Medication: Qty: 1 | Status: AC

## 2015-01-02 ENCOUNTER — Emergency Department (INDEPENDENT_AMBULATORY_CARE_PROVIDER_SITE_OTHER)
Admission: EM | Admit: 2015-01-02 | Discharge: 2015-01-02 | Disposition: A | Discharge status: Home / Self Care | Payer: Medicare PPO | Source: Home / Self Care | Attending: Family Medicine | Admitting: Family Medicine

## 2015-01-02 ENCOUNTER — Encounter (HOSPITAL_COMMUNITY): Payer: Self-pay | Admitting: Emergency Medicine

## 2015-01-02 DIAGNOSIS — T148 Other injury of unspecified body region: Secondary | ICD-10-CM

## 2015-01-02 DIAGNOSIS — W57XXXA Bitten or stung by nonvenomous insect and other nonvenomous arthropods, initial encounter: Secondary | ICD-10-CM

## 2015-01-02 MED ORDER — DOXYCYCLINE HYCLATE 100 MG PO CAPS: 100.0000 mg | ORAL_CAPSULE | Freq: Two times a day (BID) | ORAL | Status: DC

## 2015-01-02 NOTE — ED Provider Notes (Signed)
CSN: [id number]     Arrival date & time 01/02/15  1539 History   First MD Initiated Contact with Patient 01/02/15 1654     No chief complaint on file.  (Consider location/radiation/quality/duration/timing/severity/associated sxs/prior Treatment) HPI Comments: 74 year old male presents with what he considers to be various bug bites. He states [place] and [place] there all day long. Over the past 2-3 days he has had various bites to his upper and lower extremities. His greatest concern is that of a nodule to the occipital scalp. He denies systemic symptoms.   No past medical history on file. No past surgical history on file. No family history on file. History  Substance Use Topics  . Smoking status: Current Every Day Smoker    Types: Cigarettes  . Smokeless tobacco: Not on file  . Alcohol Use: Yes     Comment: social     Review of Systems  Constitutional: Negative for fever, activity change and fatigue.  HENT: Negative.   Respiratory: Negative for cough, shortness of breath and wheezing.   Cardiovascular: Negative.   Gastrointestinal: Negative.   Skin: Positive for wound.  Neurological: Negative.     Allergies  Review of patient's allergies indicates no known allergies.  Home Medications   Prior to Admission medications   Medication Sig Start Date End Date Taking? Authorizing Provider  acetaminophen (TYLENOL) 325 MG tablet Take 650 mg by mouth every 8 (eight) hours as needed.      Historical Provider, MD  aspirin 81 MG tablet Take 81 mg by mouth daily.      Historical Provider, MD  chlorhexidine (PERIDEX) 0.12 % solution Use as directed 15 mLs in the mouth or throat 2 (two) times daily. 02/13/14   Reuben Likesavid C Keller, MD  chlorthalidone (HYGROTON) 50 MG tablet Take 25 mg by mouth daily.      Historical Provider, MD  doxycycline (VIBRAMYCIN) 100 MG capsule Take 1 capsule (100 mg total) by mouth 2 (two) times daily. 01/02/15   Hayden Rasmussenavid Quientin Jent, NP  FLUoxetine  (PROZAC) 10 MG capsule Take 10 mg by mouth daily.      Historical Provider, MD  ranitidine (ZANTAC) 150 MG capsule Take 150 mg by mouth 2 (two) times daily.      Historical Provider, MD  traMADol (ULTRAM) 50 MG tablet Take 50 mg by mouth every 8 (eight) hours as needed for moderate pain.     Historical Provider, MD   BP 157/105 mmHg  Pulse 81  Temp(Src) 98.1 F (36.7 C) (Oral)  Resp 12  SpO2 97% Physical Exam  Constitutional: He is oriented to person, place, and time. He appears well-developed. No distress.  HENT:  There is a 2.5 cm area of induration to the occipital scalp. The center of the scalp had been losing fluid earlier during the day according to the patient. On observation there is a scant amount of serous fluid. There is no fluctuance to the wound. No erythema or other discoloration. No enanthem. No intraoral swelling. No edema to the uvula, tongue, oropharynx mucosa.  Neck: Normal range of motion. Neck supple.  Pulmonary/Chest: Effort normal. No respiratory distress.  Lymphadenopathy:    He has no cervical adenopathy.  Neurological: He is alert and oriented to person, place, and time. He exhibits normal muscle tone.  Skin: Skin is warm.  There are 2 or 3 small papular lesions in various stages of healing to the right lower leg. None appear infected. There are 2  small papular lesions to the right arm. The one to the distal forearm has mild pruritus. No urticaria.  Psychiatric: He has a normal mood and affect.  Nursing note and vitals reviewed.   ED Course  Procedures (including critical care time) Labs Review Labs Reviewed - No data to display  Imaging Review No results found.   MDM   1. Insect bites    Warm compresses to the posterior scalp lesion. Doxycycline as directed If this nodular lesion increases in size becomes more painful or worsening of any time may return. Use insect repellent when outside Patient is discharged in stable condition. There is no sign  of a systemic allergic reaction. There is no urticaria or swelling.   Hayden Rasmussenavid Acen Craun, NP 01/02/15 1710

## 2015-01-02 NOTE — ED Notes (Signed)
Patient c/o four different spider bites on back of his head, right forearm and right leg. Patient reports he has felt "swimmy headed" Patient reports he has dementia so is not sure when he had the bites. Patient is in NAD.

## 2015-01-02 NOTE — Discharge Instructions (Signed)
Insect Bite Mosquitoes, flies, fleas, bedbugs, and many other insects can bite. Insect bites are different from insect stings. A sting is when venom is injected into the skin. Some insect bites can transmit infectious diseases. SYMPTOMS  Insect bites usually turn red, swell, and itch for 2 to 4 days. They often go away on their own. TREATMENT  Your caregiver may prescribe antibiotic medicines if a bacterial infection develops in the bite. HOME CARE INSTRUCTIONS  Do not scratch the bite area.  Keep the bite area clean and dry. Wash the bite area thoroughly with soap and water.  Put ice or cool compresses on the bite area.  Put ice in a plastic bag.  Place a towel between your skin and the bag.  Leave the ice on for 20 minutes, 4 times a day for the first 2 to 3 days, or as directed.  You may apply a baking soda paste, cortisone cream, or calamine lotion to the bite area as directed by your caregiver. This can help reduce itching and swelling.  Only take over-the-counter or prescription medicines as directed by your caregiver.  If you are given antibiotics, take them as directed. Finish them even if you start to feel better. You may need a tetanus shot if:  You cannot remember when you had your last tetanus shot.  You have never had a tetanus shot.  The injury broke your skin. If you get a tetanus shot, your arm may swell, get red, and feel warm to the touch. This is common and not a problem. If you need a tetanus shot and you choose not to have one, there is a rare chance of getting tetanus. Sickness from tetanus can be serious. SEEK IMMEDIATE MEDICAL CARE IF:   You have increased pain, redness, or swelling in the bite area.  You see a red line on the skin coming from the bite.  You have a fever.  You have joint pain.  You have a headache or neck pain.  You have unusual weakness.  You have a rash.  You have chest pain or shortness of breath.  You have abdominal pain,  nausea, or vomiting.  You feel unusually tired or sleepy. MAKE SURE YOU:   Understand these instructions.  Will watch your condition.  Will get help right away if you are not doing well or get worse. Document Released: 09/18/2004 Document Revised: 11/03/2011 Document Reviewed: 03/12/2011 [facility] Patient Information 2015 [place], [place]. This information is not intended to replace advice given to you by your health care provider. Make sure you discuss any questions you have with your health care provider.  DEET Insect Repellent  DEET is a commonly used insect repellent. DEET is effective against mosquitoes, ticks, and chiggers.DEET is not effective against stinging insects, such as bees and wasps. When mosquitoes or ticks are active, take the following precautions.  Use DEET according to the directions on the label.  Wear protective clothing if you are outside in an area where there are weeds, tall grass, or bushes. This includes long pants, socks, and loose-fitting, long-sleeved shirts. Consider spraying DEET on your clothing. Avoid being outdoors in the early evening. This is when mosquitoes are most active.  Products with a low concentration of DEET (10% to 20%) may be useful in areas with few insects. Higher concentrations of DEET may be needed in areas with many insects. Repellents used on children should not contain more than 30% DEET. Although higher concentrations of DEET (up to 95%) are  available for adults, they are not recommended for routine use. Concentrations higher than 50% do not provide additional protection. Depending on the concentration of DEET in a product, it can be effective for about 2 to 6 hours.  When applying DEET to children, use the lowest concentration that is effective. Ten percent DEET will last approximately 2 to 3 hours, while 30% will last 4 to 5 hours. Do not use DEET on infants younger than 2 months old. Do not apply DEET more often than once a day to  children under the age of 2.  Avoid prolonged or excessive use of DEET. Use it sparingly to cover exposed skin and clothing. Adverse reactions to DEET in the recommended concentrations are uncommon. However, skin irritation can occur in some people.  Wash all treated skin and clothing with soap and water after returning indoors.  Do not allow children to apply insect repellent themselves.  Do not apply DEET near cuts or open wounds. You can apply DEET and sunscreen together. However, it is recommend that you apply the sunscreen first.  Do not apply DEET to a child's hands or near a child's eyes and mouth. If DEET is accidentally sprayed in the eyes, wash the eyes out with large amounts of water.  Store DEET out of the reach of children.  Most authorities feel that it is safe to use DEET during pregnancy. However, pregnant women should only use insect repellents when they are in areas with a high risk of disease carried by insects (malaria, West Nile virus, encephalitis). Document Released: 05/06/2001 Document Revised: 12/26/2013 Document Reviewed: 04/30/2011 [facility]ExitCare Patient Information 2015 [place], [place]. This information is not intended to replace advice given to you by your health care provider. Make sure you discuss any questions you have with your health care provider.

## 2015-01-09 ENCOUNTER — Emergency Department (HOSPITAL_COMMUNITY)
Admission: EM | Admit: 2015-01-09 | Discharge: 2015-01-09 | Disposition: A | Discharge status: Home / Self Care | Payer: Medicare PPO | Attending: Emergency Medicine | Admitting: Emergency Medicine

## 2015-01-09 ENCOUNTER — Emergency Department (HOSPITAL_COMMUNITY): Payer: Medicare PPO

## 2015-01-09 ENCOUNTER — Encounter (HOSPITAL_COMMUNITY): Payer: Self-pay | Admitting: Neurology

## 2015-01-09 DIAGNOSIS — Z720 Tobacco use: Secondary | ICD-10-CM | POA: Insufficient documentation

## 2015-01-09 DIAGNOSIS — Z791 Long term (current) use of non-steroidal anti-inflammatories (NSAID): Secondary | ICD-10-CM | POA: Diagnosis not present

## 2015-01-09 DIAGNOSIS — Z8673 Personal history of transient ischemic attack (TIA), and cerebral infarction without residual deficits: Secondary | ICD-10-CM | POA: Insufficient documentation

## 2015-01-09 DIAGNOSIS — R51 Headache: Secondary | ICD-10-CM | POA: Diagnosis not present

## 2015-01-09 DIAGNOSIS — I10 Essential (primary) hypertension: Secondary | ICD-10-CM | POA: Diagnosis not present

## 2015-01-09 DIAGNOSIS — Z792 Long term (current) use of antibiotics: Secondary | ICD-10-CM | POA: Diagnosis not present

## 2015-01-09 DIAGNOSIS — E780 Pure hypercholesterolemia: Secondary | ICD-10-CM | POA: Diagnosis not present

## 2015-01-09 DIAGNOSIS — Z79899 Other long term (current) drug therapy: Secondary | ICD-10-CM | POA: Insufficient documentation

## 2015-01-09 DIAGNOSIS — R42 Dizziness and giddiness: Secondary | ICD-10-CM | POA: Diagnosis present

## 2015-01-09 DIAGNOSIS — Z7982 Long term (current) use of aspirin: Secondary | ICD-10-CM | POA: Diagnosis not present

## 2015-01-09 HISTORY — DX: Pure hypercholesterolemia, unspecified: E78.00

## 2015-01-09 HISTORY — DX: Cerebral infarction, unspecified: I63.9

## 2015-01-09 HISTORY — DX: Essential (primary) hypertension: I10

## 2015-01-09 LAB — DIFFERENTIAL
Basophils Absolute: 0 10*3/uL (ref 0.0–0.1)
Basophils Relative: 0 % (ref 0–1)
Eosinophils Absolute: 0.3 10*3/uL (ref 0.0–0.7)
Eosinophils Relative: 5 % (ref 0–5)
LYMPHS PCT: 24 % (ref 12–46)
Lymphs Abs: 1.5 10*3/uL (ref 0.7–4.0)
MONOS PCT: 7 % (ref 3–12)
Monocytes Absolute: 0.4 10*3/uL (ref 0.1–1.0)
NEUTROS ABS: 4 10*3/uL (ref 1.7–7.7)
Neutrophils Relative %: 64 % (ref 43–77)

## 2015-01-09 LAB — COMPREHENSIVE METABOLIC PANEL
ALT: 22 U/L (ref 17–63)
AST: 24 U/L (ref 15–41)
Albumin: 4.5 g/dL (ref 3.5–5.0)
Alkaline Phosphatase: 75 U/L (ref 38–126)
Anion gap: 11 (ref 5–15)
BUN: 9 mg/dL (ref 6–20)
CALCIUM: 9.6 mg/dL (ref 8.9–10.3)
CO2: 30 mmol/L (ref 22–32)
Chloride: 97 mmol/L — ABNORMAL LOW (ref 101–111)
Creatinine, Ser: 0.86 mg/dL (ref 0.61–1.24)
GFR calc Af Amer: >60 mL/min (ref >60)
GFR calc non Af Amer: >60 mL/min (ref >60)
Glucose, Bld: 89 mg/dL (ref 65–99)
POTASSIUM: 4.1 mmol/L (ref 3.5–5.1)
SODIUM: 138 mmol/L (ref 135–145)
Total Bilirubin: 0.7 mg/dL (ref 0.3–1.2)
Total Protein: 8.3 g/dL — ABNORMAL HIGH (ref 6.5–8.1)

## 2015-01-09 LAB — CBC
HEMATOCRIT: 46.7 % (ref 39.0–52.0)
HEMOGLOBIN: 15.6 g/dL (ref 13.0–17.0)
MCH: 29.9 pg (ref 26.0–34.0)
MCHC: 33.4 g/dL (ref 30.0–36.0)
MCV: 89.6 fL (ref 78.0–100.0)
Platelets: 184 10*3/uL (ref 150–400)
RBC: 5.21 MIL/uL (ref 4.22–5.81)
RDW: 12.9 % (ref 11.5–15.5)
WBC: 6.2 10*3/uL (ref 4.0–10.5)

## 2015-01-09 LAB — ETHANOL

## 2015-01-09 LAB — RAPID URINE DRUG SCREEN, HOSP PERFORMED
AMPHETAMINES: NOT DETECTED
BARBITURATES: NOT DETECTED
Benzodiazepines: NOT DETECTED
COCAINE: NOT DETECTED
Opiates: NOT DETECTED
Tetrahydrocannabinol: NOT DETECTED

## 2015-01-09 LAB — APTT: aPTT: 32 seconds (ref 24–37)

## 2015-01-09 LAB — URINALYSIS, ROUTINE W REFLEX MICROSCOPIC
Bilirubin Urine: NEGATIVE
GLUCOSE, UA: NEGATIVE mg/dL
Ketones, ur: NEGATIVE mg/dL
LEUKOCYTES UA: NEGATIVE
Nitrite: NEGATIVE
PROTEIN: NEGATIVE mg/dL
SPECIFIC GRAVITY, URINE: 1.006 (ref 1.005–1.030)
Urobilinogen, UA: 0.2 mg/dL (ref 0.0–1.0)
pH: 6.5 (ref 5.0–8.0)

## 2015-01-09 LAB — PROTIME-INR
INR: 1.07 (ref 0.00–1.49)
Prothrombin Time: 14.1 seconds (ref 11.6–15.2)

## 2015-01-09 LAB — URINE MICROSCOPIC-ADD ON

## 2015-01-09 LAB — I-STAT TROPONIN, ED: Troponin i, poc: 0 ng/mL (ref 0.00–0.08)

## 2015-01-09 MED ORDER — IPRATROPIUM BROMIDE 0.02 % IN SOLN
0.5000 mg | Freq: Once | RESPIRATORY_TRACT | Status: DC
Start: 2015-01-09 — End: 2015-01-09

## 2015-01-09 MED ORDER — ALBUTEROL SULFATE (2.5 MG/3ML) 0.083% IN NEBU: 5.0000 mg | INHALATION_SOLUTION | Freq: Once | RESPIRATORY_TRACT | Status: DC

## 2015-01-09 NOTE — ED Notes (Signed)
Pt ambulated independently-- with 2 standby assist-- without difficulty, witnessed by Dr. Lynelle DoctorKnapp-- pt states gait is normal for him. Verbal order to take Gabapentin 300mg  of own medicine-- by Dr. Lynelle DoctorKnapp.  Pt given Gabapentin 300mg  po from own medicine,.

## 2015-01-09 NOTE — ED Provider Notes (Signed)
CSN: [id number]     Arrival date & time 01/09/15  1247 History   First MD Initiated Contact with Patient 01/09/15 1253     Chief complaint: Dizziness HPI Patient presents to the emergency room this morning after having an episode of dizziness at about 11:30 in the morning. The patient was at home cleaning his bathroom.  He finished and went to sit down.  He had sudden onset of lightheadedness.  No diplopia.   No blurred vision.  No room spinning.  He felt poorly and called 911.  He continues to have a mild headache.  The dizziness is better.  EMS noted that he had some stuttering speech but that has resolved as well. Past Medical History  Diagnosis Date  . Hypertension   . Stroke   . Hypercholesteremia    History reviewed. No pertinent past surgical history. No family history on file. History  Substance Use Topics  . Smoking status: Current Every Day Smoker    Types: Cigarettes  . Smokeless tobacco: Not on file  . Alcohol Use: Yes     Comment: social     Review of Systems  All other systems reviewed and are negative.     Allergies  Review of patient's allergies indicates no known allergies.  Home Medications   Prior to Admission medications   Medication Sig Start Date End Date Taking? Authorizing Provider  acetaminophen (TYLENOL) 325 MG tablet Take 650 mg by mouth every 8 (eight) hours as needed for mild pain.    Yes Historical Provider, MD  aspirin 81 MG tablet Take 81 mg by mouth daily.     Yes Historical Provider, MD  doxycycline (VIBRAMYCIN) 100 MG capsule Take 1 capsule (100 mg total) by mouth 2 (two) times daily. 01/02/15  Yes Hayden Rasmussenavid Mabe, NP  gabapentin (NEURONTIN) 100 MG capsule Take 100-300 mg by mouth 3 (three) times daily.   Yes Historical Provider, MD  hydrochlorothiazide (HYDRODIURIL) 12.5 MG tablet Take 12.5 mg by mouth daily.   Yes Historical Provider, MD  meloxicam (MOBIC) 15 MG tablet Take 15 mg by mouth daily.   Yes Historical Provider, MD  pravastatin  (PRAVACHOL) 10 MG tablet Take 10 mg by mouth daily.   Yes Historical Provider, MD  sertraline (ZOLOFT) 25 MG tablet Take 25 mg by mouth daily.   Yes Historical Provider, MD  chlorhexidine (PERIDEX) 0.12 % solution Use as directed 15 mLs in the mouth or throat 2 (two) times daily. Patient not taking: Reported on 01/09/2015 02/13/14   Reuben Likesavid C Keller, MD   BP 125/88 mmHg  Pulse 73  Temp(Src) 98.8 F (37.1 C) (Oral)  Resp 20  Ht 5\' 11"  (1.803 m)  Wt 123 lb (55.792 kg)  BMI 17.16 kg/m2  SpO2 99% Physical Exam  Constitutional: He is oriented to person, place, and time. He appears well-developed and well-nourished. No distress.  HENT:  Head: Normocephalic and atraumatic.  Right Ear: External ear normal.  Left Ear: External ear normal.  Mouth/Throat: Oropharynx is clear and moist.  Eyes: Conjunctivae are normal. Right eye exhibits no discharge. Left eye exhibits no discharge. No scleral icterus.  Neck: Neck supple. No tracheal deviation present.  Cardiovascular: Normal rate, regular rhythm and intact distal pulses.   Pulmonary/Chest: Effort normal. No stridor. No respiratory distress. He has no wheezes. He has no rales.  Abdominal: Soft. Bowel sounds are normal. He exhibits no distension. There is no tenderness. There is no rebound and no guarding.  Musculoskeletal: He exhibits no edema  or tenderness.  Neurological: He is alert and oriented to person, place, and time. He has normal strength. No cranial nerve deficit (no facial droop, extraocular movements intact, no slurred speech) or sensory deficit. He exhibits normal muscle tone. He displays no seizure activity. Coordination normal.  No pronator drift bilateral upper extrem, able to hold both legs off bed for 5 seconds, sensation intact in all extremities, no visual field cuts, no left or right sided neglect, abnormal normal finger-nose exam bilaterally with intermittent inability to touch the tip of my finger, no nystagmus noted   Skin: Skin  is warm and dry. No rash noted.  Psychiatric: He has a normal mood and affect.  Nursing note and vitals reviewed.   ED Course  Procedures (including critical care time) Labs Review Labs Reviewed  COMPREHENSIVE METABOLIC PANEL - Abnormal; Notable for the following:    Chloride 97 (*)    Total Protein 8.3 (*)    All other components within normal limits  URINALYSIS, ROUTINE W REFLEX MICROSCOPIC - Abnormal; Notable for the following:    Hgb urine dipstick TRACE (*)    All other components within normal limits  ETHANOL  PROTIME-INR  APTT  CBC  DIFFERENTIAL  URINE RAPID DRUG SCREEN (HOSP PERFORMED)  URINE MICROSCOPIC-ADD ON  Rosezena SensorI-STAT TROPOININ, ED    Imaging Review Ct Head Wo Contrast  01/09/2015   CLINICAL DATA:  74 year old male with frontal headache and dizziness. Family history of cerebral aneurysm. Initial encounter.  EXAM: CT HEAD WITHOUT CONTRAST  TECHNIQUE: Contiguous axial images were obtained from the base of the skull through the vertex without intravenous contrast.  COMPARISON:  05/11/2008 head CT.  FINDINGS: Minor paranasal sinus mucosal thickening. Visible mastoids and tympanic cavities are clear. No acute osseous abnormality identified.  No acute orbit or scalp soft tissue finding. Calcified atherosclerosis at the skull base.  Cerebral volume remains normal for age. Minimal to mild for age nonspecific cerebral white matter hypodensity. No midline shift, ventriculomegaly, mass effect, evidence of mass lesion, intracranial hemorrhage or evidence of cortically based acute infarction. Gray-white matter differentiation is within normal limits throughout the brain. No suspicious intracranial vascular hyperdensity.  IMPRESSION: Negative for age noncontrast CT appearance of the brain.   Electronically Signed   By: Odessa FlemingH  Hall M.D.   On: 01/09/2015 14:47    EKG Interpretation  Date/Time:  Tuesday Jan 09 2015 13:10:12 EDT Ventricular Rate:  80 PR Interval:  148 QRS Duration: 95 QT  Interval:  400 QTC Calculation: 461 R Axis:   93 Text Interpretation:  Sinus rhythm Atrial premature complex Consider left atrial enlargement Right axis deviation Probable anteroseptal infarct, old No significant change since last tracing Confirmed by Kennard Fildes  MD-J, Josias Tomerlin ([id number]) on 01/09/2015 4:23:59 PM         MDM   Pt had some difficulty with finger to nose exam but non consistently.  Pt was able to get up and walk in the ED.  His gait is not normal but he uses a cane normally and the patient feels he is walking like he usually does.   Will MRI brain  To evaluate for occult cva.  If negative, I think pt can safely go home.  Labs and exam other wise normal and he is feeling well.  D/w Dr Karma GanjaLinker who will follow up on mri results   Linwood DibblesJon Darlin Stenseth, MD 01/09/15 [phone]

## 2015-01-09 NOTE — Discharge Instructions (Signed)
Return to the ED with any concerns including weakness of arms or legs, changes in vision or speech, fainting, decreased level of alertness/lethargy, or any other alarming symptoms °

## 2015-01-09 NOTE — ED Notes (Signed)
Per EMS- Pt coming from his apartment where he lives alone. This morning at 1130 when he was cleaning bathroom reports onset of dizziness, negative orthostatic, denies pain. Was lying on couch when EMS arrived with stutter present. Has hx of TIA, developed h/a in route 5/10. EKG SR HR 82, BP 171/110, CBG 93. Stutter has now resolved and he reports he felt dizzy after he took his BP meds and antibiotic.

## 2015-03-12 DIAGNOSIS — E780 Pure hypercholesterolemia: Secondary | ICD-10-CM | POA: Diagnosis not present

## 2015-03-12 DIAGNOSIS — F4310 Post-traumatic stress disorder, unspecified: Secondary | ICD-10-CM | POA: Diagnosis not present

## 2015-03-12 DIAGNOSIS — I10 Essential (primary) hypertension: Secondary | ICD-10-CM | POA: Diagnosis not present

## 2015-03-12 DIAGNOSIS — Z681 Body mass index (BMI) 19 or less, adult: Secondary | ICD-10-CM | POA: Diagnosis not present

## 2015-03-12 DIAGNOSIS — M159 Polyosteoarthritis, unspecified: Secondary | ICD-10-CM | POA: Diagnosis not present

## 2015-03-12 DIAGNOSIS — E46 Unspecified protein-calorie malnutrition: Secondary | ICD-10-CM | POA: Diagnosis not present

## 2015-03-27 ENCOUNTER — Ambulatory Visit (INDEPENDENT_AMBULATORY_CARE_PROVIDER_SITE_OTHER): Payer: Medicare PPO | Admitting: Family

## 2015-03-27 ENCOUNTER — Encounter: Payer: Self-pay | Admitting: Family

## 2015-03-27 VITALS — BP 140/88 | HR 75 | Temp 98.3°F | Resp 18 | Ht 71.0 in | Wt 116.0 lb

## 2015-03-27 DIAGNOSIS — I10 Essential (primary) hypertension: Secondary | ICD-10-CM

## 2015-03-27 DIAGNOSIS — F17200 Nicotine dependence, unspecified, uncomplicated: Secondary | ICD-10-CM

## 2015-03-27 DIAGNOSIS — Z720 Tobacco use: Secondary | ICD-10-CM

## 2015-03-27 DIAGNOSIS — F329 Major depressive disorder, single episode, unspecified: Secondary | ICD-10-CM | POA: Diagnosis not present

## 2015-03-27 DIAGNOSIS — M159 Polyosteoarthritis, unspecified: Secondary | ICD-10-CM | POA: Diagnosis not present

## 2015-03-27 DIAGNOSIS — F32A Depression, unspecified: Secondary | ICD-10-CM

## 2015-03-27 MED ORDER — NICOTINE 21 MG/24HR TD PT24: 21.0000 mg | MEDICATED_PATCH | Freq: Every day | TRANSDERMAL | Status: DC

## 2015-03-27 MED ORDER — CENTRUM SILVER ADULT 50+ PO TABS: 1.0000 | ORAL_TABLET | Freq: Every day | ORAL | Status: DC

## 2015-03-27 MED ORDER — MELOXICAM 15 MG PO TABS: 15.0000 mg | ORAL_TABLET | Freq: Every day | ORAL | Status: DC

## 2015-03-27 MED ORDER — FISH OIL 1000 MG PO CAPS: 1000.0000 | ORAL_CAPSULE | Freq: Every day | ORAL | Status: DC

## 2015-03-27 MED ORDER — HYDROCHLOROTHIAZIDE 12.5 MG PO TABS: 12.5000 mg | ORAL_TABLET | Freq: Every day | ORAL | Status: DC

## 2015-03-27 MED ORDER — SERTRALINE HCL 25 MG PO TABS: 25.0000 mg | ORAL_TABLET | Freq: Every day | ORAL | Status: DC

## 2015-03-27 MED ORDER — PRAVASTATIN SODIUM 10 MG PO TABS: 10.0000 mg | ORAL_TABLET | Freq: Every day | ORAL | Status: DC

## 2015-03-27 NOTE — Assessment & Plan Note (Signed)
Stable with current dosage of HCTZ. Takes the medications as prescribed and denies adverse side effects. Continue current dosage of HCTZ.

## 2015-03-27 NOTE — Assessment & Plan Note (Signed)
Continues to experience symptoms of arthralgia. Adequately controlled with current dose of meloxicam. Discussed importance of continued physical activity to help with decreasing symptoms in addition to ice/heat and creams as needed. Recommend started glucosamine to assist with joint health. Follow up if symptoms worsen or are less controlled.

## 2015-03-27 NOTE — Patient Instructions (Addendum)
Thank you for choosing [facility].  Summary/Instructions:  Please schedule a time for your physical at your convenience.  Please continue to take your medications as prescribed.   Try glucosamine and chondrotin for your joints.  Your prescription(s) have been submitted to your pharmacy or been printed and provided for you. Please take as directed and contact our office if you believe you are having problem(s) with the medication(s) or have any questions.  If your symptoms worsen or fail to improve, please contact our office for further instruction, or in case of emergency go directly to the emergency room at the closest medical facility.   Smoking Cessation Quitting smoking is important to your health and has many advantages. However, it is not always easy to quit since nicotine is a very addictive drug. Oftentimes, people try 3 times or more before being able to quit. This document explains the best ways for you to prepare to quit smoking. Quitting takes hard work and a lot of effort, but you can do it. ADVANTAGES OF QUITTING SMOKING  You will live longer, feel better, and live better.  Your body will feel the impact of quitting smoking almost immediately.  Within 20 minutes, blood pressure decreases. Your pulse returns to its normal level.  After 8 hours, carbon monoxide levels in the blood return to normal. Your oxygen level increases.  After 24 hours, the chance of having a heart attack starts to decrease. Your breath, hair, and body stop smelling like smoke.  After 48 hours, damaged nerve endings begin to recover. Your sense of taste and smell improve.  After 72 hours, the body is virtually free of nicotine. Your bronchial tubes relax and breathing becomes easier.  After 2 to 12 weeks, lungs can hold more air. Exercise becomes easier and circulation improves.  The risk of having a heart attack, stroke, cancer, or lung disease is greatly reduced.  After 1 year, the  risk of coronary heart disease is cut in half.  After 5 years, the risk of stroke falls to the same as a nonsmoker.  After 10 years, the risk of lung cancer is cut in half and the risk of other cancers decreases significantly.  After 15 years, the risk of coronary heart disease drops, usually to the level of a nonsmoker.  If you are pregnant, quitting smoking will improve your chances of having a healthy baby.  The people you live with, especially any children, will be healthier.  You will have extra money to spend on things other than cigarettes. QUESTIONS TO THINK ABOUT BEFORE ATTEMPTING TO QUIT You may want to talk about your answers with your health care provider.  Why do you want to quit?  If you tried to quit in the past, what helped and what did not?  What will be the most difficult situations for you after you quit? How will you plan to handle them?  Who can help you through the tough times? Your family? Friends? A health care provider?  What pleasures do you get from smoking? What ways can you still get pleasure if you quit? Here are some questions to ask your health care provider:  How can you help me to be successful at quitting?  What medicine do you think would be best for me and how should I take it?  What should I do if I need more help?  What is smoking withdrawal like? How can I get information on withdrawal? GET READY  Set a quit date.  Change your environment by getting rid of all cigarettes, ashtrays, matches, and lighters in your home, car, or work. Do not let people smoke in your home.  Review your past attempts to quit. Think about what worked and what did not. GET SUPPORT AND ENCOURAGEMENT You have a better chance of being successful if you have help. You can get support in many ways.  Tell your family, friends, and coworkers that you are going to quit and need their support. Ask them not to smoke around you.  Get individual, group, or telephone  counseling and support. Programs are available at [employer] and health centers. Call your local health department for information about programs in your area.  Spiritual beliefs and practices may help some smokers quit.  Download a "quit meter" on your computer to keep track of quit statistics, such as how long you have gone without smoking, cigarettes not smoked, and money saved.  Get a self-help book about quitting smoking and staying off tobacco. LEARN NEW SKILLS AND BEHAVIORS  Distract yourself from urges to smoke. Talk to someone, go for a walk, or occupy your time with a task.  Change your normal routine. Take a different route to work. Drink tea instead of coffee. Eat breakfast in a different place.  Reduce your stress. Take a hot bath, exercise, or read a book.  Plan something enjoyable to do every day. Reward yourself for not smoking.  Explore interactive web-based programs that specialize in helping you quit. GET MEDICINE AND USE IT CORRECTLY Medicines can help you stop smoking and decrease the urge to smoke. Combining medicine with the above behavioral methods and support can greatly increase your chances of successfully quitting smoking.  Nicotine replacement therapy helps deliver nicotine to your body without the negative effects and risks of smoking. Nicotine replacement therapy includes nicotine gum, lozenges, inhalers, nasal sprays, and skin patches. Some may be available over-the-counter and others require a prescription.  Antidepressant medicine helps people abstain from smoking, but how this works is unknown. This medicine is available by prescription.  Nicotinic receptor partial agonist medicine simulates the effect of nicotine in your brain. This medicine is available by prescription. Ask your health care provider for advice about which medicines to use and how to use them based on your health history. Your health care provider will tell you what side effects to  look out for if you choose to be on a medicine or therapy. Carefully read the information on the package. Do not use any other product containing nicotine while using a nicotine replacement product.  RELAPSE OR DIFFICULT SITUATIONS Most relapses occur within the first 3 months after quitting. Do not be discouraged if you start smoking again. Remember, most people try several times before finally quitting. You may have symptoms of withdrawal because your body is used to nicotine. You may crave cigarettes, be irritable, feel very hungry, cough often, get headaches, or have difficulty concentrating. The withdrawal symptoms are only temporary. They are strongest when you first quit, but they will go away within 10-14 days. To reduce the chances of relapse, try to:  Avoid drinking alcohol. Drinking lowers your chances of successfully quitting.  Reduce the amount of caffeine you consume. Once you quit smoking, the amount of caffeine in your body increases and can give you symptoms, such as a rapid heartbeat, sweating, and anxiety.  Avoid smokers because they can make you want to smoke.  Do not let weight gain distract you. Many smokers will gain  weight when they quit, usually less than 10 pounds. Eat a healthy diet and stay active. You can always lose the weight gained after you quit.  Find ways to improve your mood other than smoking. FOR MORE INFORMATION  www.smokefree.gov  Document Released: 08/05/2001 Document Revised: 12/26/2013 Document Reviewed: 11/20/2011 [facility] Patient Information 2015 [place], [place]. This information is not intended to replace advice given to you by your health care provider. Make sure you discuss any questions you have with your health care provider.

## 2015-03-27 NOTE — Assessment & Plan Note (Signed)
Ready to quit smoking. Start Nicoderm patches to assist. Smoking cessation information and resources provided. Follow up as needed.

## 2015-03-27 NOTE — Assessment & Plan Note (Signed)
Stable with current dosage of Zoloft and denies adverse side effects. Denies suicidal ideation. Continue current dosage of Zoloft.

## 2015-03-27 NOTE — Progress Notes (Signed)
Pre visit review using our clinic review tool, if applicable. No additional management support is needed unless otherwise documented below in the visit note. 

## 2015-03-27 NOTE — Progress Notes (Signed)
Subjective:    Patient ID: Roberto Ros., male    DOB: 09-Jul-1941, 74 y.o.   MRN: [id number]  Chief Complaint  Patient presents with  . Establish Care    Needs refill on all medications, concerned about arthritis in his hands    HPI:  Roberto Dykstra. is a 74 y.o. male with a PMH of hypertension, osteoarthritis, tobacco use, depression, and stroke who presents today for an office visit to establish care.  1.) Hypertension - Currently maintained on hydrochlorothiazide. Takes her medication as prescribed and denies adverse side effects. Does not currently monitors blood pressure at home.  BP Readings from Last 3 Encounters:  03/27/15 140/88  01/09/15 123/72  01/02/15 157/105    2.) Depression - previous diagnosed with depression and indicates it is stable with current dosage of Zoloft. Denies suicidal ideations. Takes medication as prescribed and denies adverse side effects.  3.) Tobacco use - previous pack year history approximately 16-1/2 years. Is interested in discontinuing smoking. Requesting NicoDerm patches to be sent to pharmacy to assist in quitting.  4.) Osteoarthritis - notes previous diagnosis of osteoarthritis in multiple joints which is adequately controlled with the meloxicam. Does not currently exercise. Walks around with a cane. Takes medication as prescribed and denies adverse side effects.   No Known Allergies   Outpatient Prescriptions Prior to Visit  Medication Sig Dispense Refill  . gabapentin (NEURONTIN) 100 MG capsule Take 100-300 mg by mouth 3 (three) times daily.    Marland Kitchen acetaminophen (TYLENOL) 325 MG tablet Take 650 mg by mouth every 8 (eight) hours as needed for mild pain.     Marland Kitchen aspirin 81 MG tablet Take 81 mg by mouth daily.      . chlorhexidine (PERIDEX) 0.12 % solution Use as directed 15 mLs in the mouth or throat 2 (two) times daily. (Patient not taking: Reported on 01/09/2015) 240 mL 2  . doxycycline (VIBRAMYCIN) 100 MG capsule Take 1  capsule (100 mg total) by mouth 2 (two) times daily. 20 capsule 0  . hydrochlorothiazide (HYDRODIURIL) 12.5 MG tablet Take 12.5 mg by mouth daily.    . meloxicam (MOBIC) 15 MG tablet Take 15 mg by mouth daily.    . pravastatin (PRAVACHOL) 10 MG tablet Take 10 mg by mouth daily.    . sertraline (ZOLOFT) 25 MG tablet Take 25 mg by mouth daily.     No facility-administered medications prior to visit.     Past Medical History  Diagnosis Date  . Hypertension   . Hypercholesteremia   . Arthritis   . Depression   . Chicken pox   . Syphilis   . Migraine   . Stroke     No residual effects     Past Surgical History  Procedure Laterality Date  . Hernia repair    . Back surgery       Family History  Problem Relation Age of Onset  . Healthy Mother   . Healthy Father     History   Social History  . Marital Status: Widowed    Spouse Name: N/A  . Number of Children: 2  . Years of Education: 14   Occupational History  . Retired    Social History Main Topics  . Smoking status: Current Every Day Smoker -- 0.33 packs/day for 50 years    Types: Cigarettes  . Smokeless tobacco: Never Used  . Alcohol Use: 1.2 - 1.8 oz/week    2-3 Shots of liquor per week  Comment: social   . Drug Use: No  . Sexual Activity: No   Other Topics Concern  . Not on file   Social History Narrative   Fun: Sit on the back porch and relax.   Denies religious beliefs effecting health care.     Review of Systems  Constitutional: Negative for fever, chills and diaphoresis.  Eyes:       Negative for changes in eyesight.   Respiratory: Negative for chest tightness and shortness of breath.   Cardiovascular: Negative for chest pain, palpitations and leg swelling.  Musculoskeletal: Positive for arthralgias.  Neurological: Negative for headaches.      Objective:    BP 140/88 mmHg  Pulse 75  Temp(Src) 98.3 F (36.8 C) (Oral)  Resp 18  Ht  (1.803 m)  Wt 116 lb (52.617 kg)  BMI 16.19  kg/m2  SpO2 97% Nursing note and vital signs reviewed.  Physical Exam  Constitutional: He is oriented to person, place, and time. He appears well-developed and well-nourished. No distress.  Thin elderly male appears slightly older than his stated age, dressed appropriately for the situation. He walks with a cane on the right side.  Cardiovascular: Normal rate, regular rhythm, normal heart sounds and intact distal pulses.   Pulmonary/Chest: Effort normal. He has wheezes.  Neurological: He is alert and oriented to person, place, and time.  Skin: Skin is warm and dry.  Psychiatric: He has a normal mood and affect. His behavior is normal. Judgment and thought content normal.       Assessment & Plan:   Problem List Items Addressed This Visit      Cardiovascular and Mediastinum   HYPERTENSION, BENIGN ESSENTIAL    Stable with current dosage of HCTZ. Takes the medications as prescribed and denies adverse side effects. Continue current dosage of HCTZ.      Relevant Medications   pravastatin (PRAVACHOL) 10 MG tablet   hydrochlorothiazide (HYDRODIURIL) 12.5 MG tablet     Musculoskeletal and Integument   GEN OSTEOARTHROSIS INVOLVING MULTIPLE SITES    Continues to experience symptoms of arthralgia. Adequately controlled with current dose of meloxicam. Discussed importance of continued physical activity to help with decreasing symptoms in addition to ice/heat and creams as needed. Recommend started glucosamine to assist with joint health. Follow up if symptoms worsen or are less controlled.        Relevant Medications   meloxicam (MOBIC) 15 MG tablet     Other   TOBACCO USER    Ready to quit smoking. Start Nicoderm patches to assist. Smoking cessation information and resources provided. Follow up as needed.       Relevant Medications   nicotine (NICODERM CQ) 21 mg/24hr patch   Depression - Primary    Stable with current dosage of Zoloft and denies adverse side effects. Denies suicidal  ideation. Continue current dosage of Zoloft.       Relevant Medications   sertraline (ZOLOFT) 25 MG tablet

## 2015-03-30 ENCOUNTER — Telehealth: Payer: Self-pay

## 2015-03-30 NOTE — Telephone Encounter (Signed)
LVM for pt to call back in regards to scheduling AWV with our health coach.   RE: appt with Tammy Sours for CPE on 04/16/15. Would like to schedule AWV the week before.

## 2015-04-10 ENCOUNTER — Telehealth: Payer: Self-pay

## 2015-04-10 NOTE — Progress Notes (Signed)
  This encounter was created in error - please disregard.  This encounter was created in error - please disregard.  This encounter was created in error - please disregard.  This encounter was created in error - please disregard. 

## 2015-04-10 NOTE — Telephone Encounter (Signed)
Roberto Wallace presented today for AWV; but stated that he did not have time for a lot of questions today. Stated he had company and needed to go home or his company would leave. He has not time for questions but will have time for his physical next week.  Stated he did not have his meds; Was informed that his medicine was called in at the given drugstore and he will check today and pick his medicine up. States he wants medicine for dementia; states he will lay his hat down and forget where he put it; Is concerned and thinks medicine will help. Did not have time for MMSE etc; had to go.  States he will have time for his CPE next week, but not today.  Verbalizes he wants to be tested for "Parkinson's, asbestos, colon test kit and Hep C" Asked if the [place] doctor has requested these and the patient stated no, He knew his body and he was requesting he be checked.  The patient also left a copy of his scat application for Dr. Elna Breslow to sign. Weight was 117lb and 4oz and the patient stated he was 38f 11in. BMI 16.3;   No other information was obtained and the wellness assessment was not completed; Will defer to CPE.

## 2015-04-11 NOTE — Telephone Encounter (Signed)
Noted! Thank you

## 2015-04-16 ENCOUNTER — Encounter: Payer: Self-pay | Admitting: Family

## 2015-04-16 ENCOUNTER — Other Ambulatory Visit (INDEPENDENT_AMBULATORY_CARE_PROVIDER_SITE_OTHER): Payer: Medicare PPO

## 2015-04-16 ENCOUNTER — Ambulatory Visit (INDEPENDENT_AMBULATORY_CARE_PROVIDER_SITE_OTHER)
Admission: RE | Admit: 2015-04-16 | Discharge: 2015-04-16 | Disposition: A | Discharge status: Home / Self Care | Payer: Medicare PPO | Source: Ambulatory Visit | Attending: Family | Admitting: Family

## 2015-04-16 ENCOUNTER — Ambulatory Visit (INDEPENDENT_AMBULATORY_CARE_PROVIDER_SITE_OTHER): Payer: Medicare PPO | Admitting: Family

## 2015-04-16 VITALS — BP 124/86 | HR 75 | Temp 97.8°F | Resp 18 | Ht 71.0 in | Wt 116.0 lb

## 2015-04-16 DIAGNOSIS — Z125 Encounter for screening for malignant neoplasm of prostate: Secondary | ICD-10-CM

## 2015-04-16 DIAGNOSIS — Z0000 Encounter for general adult medical examination without abnormal findings: Secondary | ICD-10-CM

## 2015-04-16 DIAGNOSIS — R062 Wheezing: Secondary | ICD-10-CM | POA: Diagnosis not present

## 2015-04-16 DIAGNOSIS — I10 Essential (primary) hypertension: Secondary | ICD-10-CM | POA: Diagnosis not present

## 2015-04-16 DIAGNOSIS — Z0001 Encounter for general adult medical examination with abnormal findings: Secondary | ICD-10-CM | POA: Insufficient documentation

## 2015-04-16 DIAGNOSIS — Z23 Encounter for immunization: Secondary | ICD-10-CM

## 2015-04-16 LAB — COMPREHENSIVE METABOLIC PANEL
ALK PHOS: 71 U/L (ref 39–117)
ALT: 13 U/L (ref 0–53)
AST: 16 U/L (ref 0–37)
Albumin: 4.5 g/dL (ref 3.5–5.2)
BUN: 6 mg/dL (ref 6–23)
CO2: 28 meq/L (ref 19–32)
Calcium: 10 mg/dL (ref 8.4–10.5)
Chloride: 101 mEq/L (ref 96–112)
Creatinine, Ser: 0.86 mg/dL (ref 0.40–1.50)
GFR: 112.52 mL/min (ref >60.00)
GLUCOSE: 94 mg/dL (ref 70–99)
POTASSIUM: 4.9 meq/L (ref 3.5–5.1)
Sodium: 139 mEq/L (ref 135–145)
Total Bilirubin: 0.6 mg/dL (ref 0.2–1.2)
Total Protein: 8.1 g/dL (ref 6.0–8.3)

## 2015-04-16 LAB — LIPID PANEL
Cholesterol: 176 mg/dL (ref 0–200)
HDL: 51.2 mg/dL (ref >39.00)
LDL CALC: 110 mg/dL — AB (ref 0–99)
NONHDL: 124.54
Total CHOL/HDL Ratio: 3
Triglycerides: 72 mg/dL (ref 0.0–149.0)
VLDL: 14.4 mg/dL (ref 0.0–40.0)

## 2015-04-16 LAB — PSA: PSA: 3.11 ng/mL (ref 0.10–4.00)

## 2015-04-16 LAB — TSH: TSH: 0.62 u[IU]/mL (ref 0.35–4.50)

## 2015-04-16 NOTE — Patient Instructions (Signed)
Thank you for choosing [facility].  Summary/Instructions:  Please stop by the lab on the basement level of the building for your blood work. Your results will be released to MyChart (or called to you) after review, usually within 72 hours after test completion. If any changes need to be made, you will be notified at that same time.  Please stop by radiology on the basement level of the building for your x-rays. Your results will be released to MyChart (or called to you) after review, usually within 72 hours after test completion. If any treatments or changes are necessary, you will be notified at that same time.  Health Maintenance  Topic Date Due  . TETANUS/TDAP  08/13/1962  . PNA vac Low Risk Adult (1 of 2 - PCV13) 08/13/2008  . INFLUENZA VACCINE  03/26/2015  . ZOSTAVAX  03/31/2016 (Originally 08/14/2003)  . COLONOSCOPY  02/22/2022   Health Maintenance A healthy lifestyle and preventative care can promote health and wellness.  Maintain regular health, dental, and eye exams.  Eat a healthy diet. Foods like vegetables, fruits, whole grains, low-fat dairy products, and lean protein foods contain the nutrients you need and are low in calories. Decrease your intake of foods high in solid fats, added sugars, and salt. Get information about a proper diet from your health care provider, if necessary.  Regular physical exercise is one of the most important things you can do for your health. Most adults should get at least 150 minutes of moderate-intensity exercise (any activity that increases your heart rate and causes you to sweat) each week. In addition, most adults need muscle-strengthening exercises on 2 or more days a week.   Maintain a healthy weight. The body mass index (BMI) is a screening tool to identify possible weight problems. It provides an estimate of body fat based on height and weight. Your health care provider can find your BMI and can help you achieve or maintain a  healthy weight. For males 20 years and older:  A BMI below 18.5 is considered underweight.  A BMI of 18.5 to 24.9 is normal.  A BMI of 25 to 29.9 is considered overweight.  A BMI of 30 and above is considered obese.  Maintain normal blood lipids and cholesterol by exercising and minimizing your intake of saturated fat. Eat a balanced diet with plenty of fruits and vegetables. Blood tests for lipids and cholesterol should begin at age 70 and be repeated every 5 years. If your lipid or cholesterol levels are high, you are over age 39, or you are at high risk for heart disease, you may need your cholesterol levels checked more frequently.Ongoing high lipid and cholesterol levels should be treated with medicines if diet and exercise are not working.  If you smoke, find out from your health care provider how to quit. If you do not use tobacco, do not start.  Lung cancer screening is recommended for adults aged 55-80 years who are at high risk for developing lung cancer because of a history of smoking. A yearly low-dose CT scan of the lungs is recommended for people who have at least a 30-pack-year history of smoking and are current smokers or have quit within the past 15 years. A pack year of smoking is smoking an average of 1 pack of cigarettes a day for 1 year (for example, a 30-pack-year history of smoking could mean smoking 1 pack a day for 30 years or 2 packs a day for 15 years). Yearly screening should  continue until the smoker has stopped smoking for at least 15 years. Yearly screening should be stopped for people who develop a health problem that would prevent them from having lung cancer treatment.  If you choose to drink alcohol, do not have more than 2 drinks per day. One drink is considered to be 12 oz (360 mL) of beer, 5 oz (150 mL) of wine, or 1.5 oz (45 mL) of liquor.  Avoid the use of street drugs. Do not share needles with anyone. Ask for help if you need support or instructions about  stopping the use of drugs.  High blood pressure causes heart disease and increases the risk of stroke. Blood pressure should be checked at least every 1-2 years. Ongoing high blood pressure should be treated with medicines if weight loss and exercise are not effective.  If you are 5-53 years old, ask your health care provider if you should take aspirin to prevent heart disease.  Diabetes screening involves taking a blood sample to check your fasting blood sugar level. This should be done once every 3 years after age 54 if you are at a normal weight and without risk factors for diabetes. Testing should be considered at a younger age or be carried out more frequently if you are overweight and have at least 1 risk factor for diabetes.  Colorectal cancer can be detected and often prevented. Most routine colorectal cancer screening begins at the age of 47 and continues through age 43. However, your health care provider may recommend screening at an earlier age if you have risk factors for colon cancer. On a yearly basis, your health care provider may provide home test kits to check for hidden blood in the stool. A small camera at the end of a tube may be used to directly examine the colon (sigmoidoscopy or colonoscopy) to detect the earliest forms of colorectal cancer. Talk to your health care provider about this at age 80 when routine screening begins. A direct exam of the colon should be repeated every 5-10 years through age 44, unless early forms of precancerous polyps or small growths are found.  People who are at an increased risk for hepatitis B should be screened for this virus. You are considered at high risk for hepatitis B if:  You were born in a country where hepatitis B occurs often. Talk with your health care provider about which countries are considered high risk.  Your parents were born in a high-risk country and you have not received a shot to protect against hepatitis B (hepatitis B  vaccine).  You have HIV or AIDS.  You use needles to inject street drugs.  You live with, or have sex with, someone who has hepatitis B.  You are a man who has sex with other men (MSM).  You get hemodialysis treatment.  You take certain medicines for conditions like cancer, organ transplantation, and autoimmune conditions.  Hepatitis C blood testing is recommended for all people born from [birth year] through [birth year] and any individual with known risk factors for hepatitis C.  Healthy men should no longer receive prostate-specific antigen (PSA) blood tests as part of routine cancer screening. Talk to your health care provider about prostate cancer screening.  Testicular cancer screening is not recommended for adolescents or adult males who have no symptoms. Screening includes self-exam, a health care provider exam, and other screening tests. Consult with your health care provider about any symptoms you have or any concerns you have about testicular cancer.  Practice safe sex. Use condoms and avoid high-risk sexual practices to reduce the spread of sexually transmitted infections (STIs).  You should be screened for STIs, including gonorrhea and chlamydia if:  You are sexually active and are younger than 24 years.  You are older than 24 years, and your health care provider tells you that you are at risk for this type of infection.  Your sexual activity has changed since you were last screened, and you are at an increased risk for chlamydia or gonorrhea. Ask your health care provider if you are at risk.  If you are at risk of being infected with HIV, it is recommended that you take a prescription medicine daily to prevent HIV infection. This is called pre-exposure prophylaxis (PrEP). You are considered at risk if:  You are a man who has sex with other men (MSM).  You are a heterosexual man who is sexually active with multiple partners.  You take drugs by injection.  You are sexually active  with a partner who has HIV.  Talk with your health care provider about whether you are at high risk of being infected with HIV. If you choose to begin PrEP, you should first be tested for HIV. You should then be tested every 3 months for as long as you are taking PrEP.  Use sunscreen. Apply sunscreen liberally and repeatedly throughout the day. You should seek shade when your shadow is shorter than you. Protect yourself by wearing long sleeves, pants, a wide-brimmed hat, and sunglasses year round whenever you are outdoors.  Tell your health care provider of new moles or changes in moles, especially if there is a change in shape or color. Also, tell your health care provider if a mole is larger than the size of a pencil eraser.  A one-time screening for abdominal aortic aneurysm (AAA) and surgical repair of large AAAs by ultrasound is recommended for men aged 65-75 years who are current or former smokers.  Stay current with your vaccines (immunizations). Document Released: 02/07/2008 Document Revised: 08/16/2013 Document Reviewed: 01/06/2011 [facility] Patient Information 2015 , [place]. This information is not intended to replace advice given to you by your health care provider. Make sure you discuss any questions you have with your health care provider.    Advance Directive Advance directives are the legal documents that allow you to make choices about your health care and medical treatment if you cannot speak for yourself. Advance directives are a way for you to communicate your wishes to family, friends, and health care providers. The specified people can then convey your decisions about end-of-life care to avoid confusion if you should become unable to communicate. Ideally, the process of discussing and writing advance directives should happen over time rather than making decisions all at once. Advance directives can be modified as your situation changes, and you can change your mind at any  time, even after you have signed the advance directives. Each state has its own laws regarding advance directives. You may want to check with your health care provider, attorney, or state representative about the law in your state. Below are some examples of advance directives. LIVING WILL A living will is a set of instructions documenting your wishes about medical care when you cannot care for yourself. It is used if you become:  Terminally ill.  Incapacitated.  Unable to communicate.  Unable to make decisions. Items to consider in your living will include:  The use or non-use of life-sustaining equipment, such as  dialysis machines and breathing machines (ventilators).  A do not resuscitate (DNR) order, which is the instruction not to use cardiopulmonary resuscitation (CPR) if breathing or heartbeat stops.  Tube feeding.  Withholding of food and fluids.  Comfort (palliative) care when the goal becomes comfort rather than a cure.  Organ and tissue donation. A living will does not give instructions about distribution of your money and property if you should pass away. It is advisable to seek the expert advice of a lawyer in drawing up a will regarding your possessions. Decisions about taxes, beneficiaries, and asset distribution will be legally binding. This process can relieve your family and friends of any burdens surrounding disputes or questions that may come up about the allocation of your assets. DO NOT RESUSCITATE (DNR) A do not resuscitate (DNR) order is a request to not have CPR in the event that your heart stops beating or you stop breathing. Unless given other instructions, a health care provider will try to help any patient whose heart has stopped or who has stopped breathing.  HEALTH CARE PROXY AND DURABLE POWER OF ATTORNEY FOR HEALTH CARE A health care proxy is a person (agent) appointed to make medical decisions for you if you cannot. Generally, people choose someone they  know well and trust to represent their preferences when they can no longer do so. You should be sure to ask this person for agreement to act as your agent. An agent may have to exercise judgment in the event of a medical decision for which your wishes are not known. The durable power of attorney for health care is the legal document that names your health care proxy. Once written, it should be:  Signed.  Notarized.  Dated.  Copied.  Witnessed.  Incorporated into your medical record. You may also want to appoint someone to manage your financial affairs if you cannot. This is called a durable power of attorney for finances. It is a separate legal document from the durable power of attorney for health care. You may choose the same person or someone different from your health care proxy to act as your agent in financial matters. Document Released: 11/18/2007 Document Revised: 08/16/2013 Document Reviewed: 12/29/2012 [facility] Patient Information 2015 [place], [place]. This information is not intended to replace advice given to you by your health care provider. Make sure you discuss any questions you have with your health care provider.

## 2015-04-16 NOTE — Assessment & Plan Note (Signed)
1) Anticipatory Guidance: Discussed importance of wearing a seatbelt while driving and not texting while driving; changing batteries in smoke detector at least once annually; wearing suntan lotion when outside; eating a balanced and moderate diet; getting physical activity at least 30 minutes per day.  2) Immunizations / Screenings / Labs:  Tetanus and Prevnar updated today. Zostavax at next office visit in approximately 4 weeks. All other immunizations are up-to-date after Pneumovax in 6-12 months. All screenings are up-to-date per recommendations. Obtain CBC, BMET, PSA, hepatitis C, Lipid profile and TSH.   Adequate exam. Patient is underweight. Discussed importance of adequate nutrition. His challenge is related to recent dental work where he had his teeth removed. He is supplementing his diet with boost and ensure. He'll be following up with dentistry in October for potential dentures. Continues to remain at risk for cardiovascular disease with hypertension and tobacco use. Hypertension is adequately controlled with hydrochlorothiazide and blood pressure is below goal of 140/90. He does have wheezing on examination and has concerns for asbestos exposure through his previous employer. Obtain chest x-ray. Discussed methods of tobacco cessation and patient will research medications to assist in the process. Continue other healthy lifestyle behaviors. Follow-up prevention exam in 1 year. Follow-up office visit pending blood work.

## 2015-04-16 NOTE — Progress Notes (Signed)
Pre visit review using our clinic review tool, if applicable. No additional management support is needed unless otherwise documented below in the visit note. 

## 2015-04-16 NOTE — Assessment & Plan Note (Signed)
Reviewed and updated patient's medical, surgical, family and social history. Medications and allergies were also reviewed. Basic screenings for depression, activities of daily living, hearing, cognition and safety were performed. Provider list was updated and health plan was provided to the patient.  

## 2015-04-16 NOTE — Progress Notes (Signed)
Subjective:    Patient ID: Roberto Ros., male    DOB: 12-03-1940, 74 y.o.   MRN: [id number]  Chief Complaint  Patient presents with  . CPE    Fasting    HPI:  Roberto Wallace. is a 74 y.o. male who presents today for an annual wellness visit.   1) Health Maintenance -   Diet - Averages 2 meals per day consisting of pasta, chicken, beef and hot dogs; limited vegetables secondary to lack of teeth. Caffeine about 3 cups per day  Exercise - 2-3x per week with a bike and walks occasionally  2) Preventative Exams / Immunizations:  Vision -- Up to date  Dentist - Up to date   Health Maintenance  Topic Date Due  . COLONOSCOPY  08/13/1993  . INFLUENZA VACCINE  03/26/2015  . ZOSTAVAX  03/31/2016 (Originally 08/14/2003)  . PNA vac Low Risk Adult (2 of 2 - PPSV23) 04/15/2016  . TETANUS/TDAP  04/15/2025  Declines shingles.   Immunization History  Administered Date(s) Administered  . Pneumococcal Conjugate-13 04/16/2015  . Td 04/16/2015     RISK FACTORS  Tobacco History  Smoking status  . Current Every Day Smoker -- 0.33 packs/day for 50 years  . Types: Cigarettes  Smokeless tobacco  . Never Used     Cardiac risk factors: advanced age (older than 87 for men, 65 for women), hypertension, male gender and smoking/ tobacco exposure.  Depression Screen  Q1: Over the past two weeks, have you felt down, depressed or hopeless? No  Q2: Over the past two weeks, have you felt little interest or pleasure in doing things? No  Have you lost interest or pleasure in daily life? No  Do you often feel hopeless? No  Do you cry easily over simple problems? No  Activities of Daily Living In your present state of health, do you have any difficulty performing the following activities?:  Driving? No Managing money?  No Feeding yourself? No Getting from bed to chair? No Climbing a flight of stairs? No Preparing food and eating?: No Bathing or showering? No Getting  dressed: No Getting to the toilet? No Using the toilet: No Moving around from place to place: No In the past year have you fallen or had a near fall?:No   Home Safety Has smoke detector and wears seat belts. No firearms. No excess sun exposure. Are there smokers in your home (other than you)?  No Do you feel safe at home?  Yes  Hearing Difficulties: No Do you often ask people to speak up or repeat themselves? No Do you experience ringing or noises in your ears? No  Do you have difficulty understanding soft or whispered voices? No    Cognitive Testing  Alert? Yes   Normal Appearance? Yes  Oriented to person? Yes  Place? Yes   Time? Yes  Recall of three objects?  Yes  Can perform simple calculations? Yes  Displays appropriate judgment? Yes  Can read the correct time from a watch face? Yes  Do you feel that you have a problem with memory? No  Do you often misplace items? No   Advanced Directives have been discussed with the patient? Yes; does not have currently  Current Physicians/Providers and Suppliers  1. Marcos Eke, FNP - Primary Care 2. Provider at [facility] - currently switching to [facility]  Indicate any recent Medical Services you may have received from other than [facility] providers in the past year (date may  be approximate).  All answers were reviewed with the patient and necessary referrals were made:  Jeanine Luz, FNP   04/16/2015   No Known Allergies   Outpatient Prescriptions Prior to Visit  Medication Sig Dispense Refill  . gabapentin (NEURONTIN) 100 MG capsule Take 100-300 mg by mouth 3 (three) times daily.    . hydrochlorothiazide (HYDRODIURIL) 12.5 MG tablet Take 1 tablet (12.5 mg total) by mouth daily. 90 tablet 1  . meloxicam (MOBIC) 15 MG tablet Take 1 tablet (15 mg total) by mouth daily. 90 tablet 0  . Multiple Vitamins-Minerals (CENTRUM SILVER ADULT 50+) TABS Take 1 capsule by mouth daily. 100 tablet 5  . nicotine (NICODERM CQ) 21 mg/24hr  patch Place 1 patch (21 mg total) onto the skin daily. 28 patch 0  . Omega-3 Fatty Acids (FISH OIL) 1000 MG CAPS Take 1,000 capsules (1,000,000 mg total) by mouth daily. 100 capsule 3  . pravastatin (PRAVACHOL) 10 MG tablet Take 1 tablet (10 mg total) by mouth daily. 90 tablet 1  . ranitidine (ZANTAC) 150 MG capsule Take 150 mg by mouth 2 (two) times daily.    . sertraline (ZOLOFT) 25 MG tablet Take 1 tablet (25 mg total) by mouth daily. 90 tablet 1   No facility-administered medications prior to visit.     Past Medical History  Diagnosis Date  . Hypertension   . Hypercholesteremia   . Arthritis   . Depression   . Chicken pox   . Syphilis   . Migraine   . Stroke     No residual effects     Past Surgical History  Procedure Laterality Date  . Hernia repair    . Back surgery       Family History  Problem Relation Age of Onset  . Healthy Mother   . Healthy Father      Social History   Social History  . Marital Status: Widowed    Spouse Name: N/A  . Number of Children: 2  . Years of Education: 14   Occupational History  . Retired    Social History Main Topics  . Smoking status: Current Every Day Smoker -- 0.33 packs/day for 50 years    Types: Cigarettes  . Smokeless tobacco: Never Used  . Alcohol Use: 1.2 - 1.8 oz/week    2-3 Shots of liquor per week     Comment: social   . Drug Use: No  . Sexual Activity: No   Other Topics Concern  . Not on file   Social History Narrative   Fun: Sit on the back porch and relax.   Denies religious beliefs effecting health care.      Review of Systems  Constitutional: Denies fever, chills, fatigue, or significant weight gain/loss. HENT: Head: Denies headache or neck pain Ears: Denies changes in hearing, ringing in ears, earache, drainage Nose: Denies discharge, stuffiness, itching, nosebleed, sinus pain Throat: Denies sore throat, hoarseness, dry mouth, sores, thrush Eyes: Denies loss/changes in vision, pain,  redness, blurry/double vision, flashing lights Cardiovascular: Denies chest pain/discomfort, tightness, palpitations, shortness of breath with activity, difficulty lying down, swelling, sudden awakening with shortness of breath Respiratory: Denies shortness of breath, cough, sputum production, wheezing Gastrointestinal: Denies dysphasia, heartburn, change in appetite, nausea, change in bowel habits, rectal bleeding, constipation, diarrhea, yellow skin or eyes Genitourinary: Denies frequency, urgency, burning/pain, blood in urine, incontinence, change in urinary strength. Musculoskeletal: Denies muscle/joint pain, stiffness, back pain, redness or swelling of joints, trauma Skin: Denies rashes, lumps, itching, dryness,  color changes, or hair/nail changes Neurological: Denies dizziness, fainting, seizures, weakness, numbness, tingling, tremor Psychiatric - Denies nervousness, stress, depression or memory loss Endocrine: Denies heat or cold intolerance, sweating, frequent urination, excessive thirst, changes in appetite Hematologic: Denies ease of bruising or bleeding    Objective:    BP 124/86 mmHg  Pulse 75  Temp(Src) 97.8 F (36.6 C) (Oral)  Resp 18  Ht 5\' 11"  (1.803 m)  Wt 116 lb (52.617 kg)  BMI 16.19 kg/m2  SpO2 98% Nursing note and vital signs reviewed.  Physical Exam  Constitutional: He is oriented to person, place, and time. He appears well-developed and well-nourished.  Thin appearing  HENT:  Head: Normocephalic.  Right Ear: Hearing, tympanic membrane, external ear and ear canal normal.  Left Ear: Hearing, tympanic membrane, external ear and ear canal normal.  Nose: Nose normal.  Mouth/Throat: Uvula is midline, oropharynx is clear and moist and mucous membranes are normal.  Eyes: Conjunctivae and EOM are normal. Pupils are equal, round, and reactive to light.  Neck: Neck supple. No JVD present. No tracheal deviation present. No thyromegaly present.  Cardiovascular: Normal  rate, regular rhythm, normal heart sounds and intact distal pulses.   Pulmonary/Chest: Effort normal. He has wheezes.  Abdominal: Soft. Bowel sounds are normal. He exhibits no distension and no mass. There is no tenderness. There is no rebound and no guarding.  Musculoskeletal: Normal range of motion. He exhibits no edema or tenderness.  Lymphadenopathy:    He has no cervical adenopathy.  Neurological: He is alert and oriented to person, place, and time. He has normal reflexes. No cranial nerve deficit. He exhibits normal muscle tone. Coordination normal.  Skin: Skin is warm and dry.  Psychiatric: He has a normal mood and affect. His behavior is normal. Judgment and thought content normal.       Assessment & Plan:   During the course of the visit the patient was educated and counseled about appropriate screening and preventive services including:    Influenza vaccine  Prostate cancer screening  Nutrition counseling   Smoking cessation counseling  Diet review for nutrition referral? Yes ____  Not Indicated _X___   Patient Instructions (the written plan) was given to the patient.  Medicare Attestation I have personally reviewed: The patient's medical and social history Their use of alcohol, tobacco or illicit drugs Their current medications and supplements The patient's functional ability including ADLs,fall risks, home safety risks, cognitive, and hearing and visual impairment Diet and physical activities Evidence for depression or mood disorders  The patient's weight, height, BMI,  have been recorded in the chart.  I have made referrals, counseling, and provided education to the patient based on review of the above and I have provided the patient with a written personalized care plan for preventive services.     Jeanine Luz, FNP   04/16/2015    Problem List Items Addressed This Visit      Other   Routine general medical examination at a health care facility    1)  Anticipatory Guidance: Discussed importance of wearing a seatbelt while driving and not texting while driving; changing batteries in smoke detector at least once annually; wearing suntan lotion when outside; eating a balanced and moderate diet; getting physical activity at least 30 minutes per day.  2) Immunizations / Screenings / Labs:  Tetanus and Prevnar updated today. Zostavax at next office visit in approximately 4 weeks. All other immunizations are up-to-date after Pneumovax in 6-12 months. All screenings are  up-to-date per recommendations. Obtain CBC, BMET, PSA, hepatitis C, Lipid profile and TSH.   Adequate exam. Patient is underweight. Discussed importance of adequate nutrition. His challenge is related to recent dental work where he had his teeth removed. He is supplementing his diet with boost and ensure. He'll be following up with dentistry in October for potential dentures. Continues to remain at risk for cardiovascular disease with hypertension and tobacco use. Hypertension is adequately controlled with hydrochlorothiazide and blood pressure is below goal of 140/90. He does have wheezing on examination and has concerns for asbestos exposure through his previous employer. Obtain chest x-ray. Discussed methods of tobacco cessation and patient will research medications to assist in the process. Continue other healthy lifestyle behaviors. Follow-up prevention exam in 1 year. Follow-up office visit pending blood work.      Relevant Orders   DG Chest 2 View (Completed)   Lipid panel   TSH   PSA   Comprehensive metabolic panel   Hepatitis C antibody   Medicare annual wellness visit, subsequent - Primary    Reviewed and updated patient's medical, surgical, family and social history. Medications and allergies were also reviewed. Basic screenings for depression, activities of daily living, hearing, cognition and safety were performed. Provider list was updated and health plan was provided to the  patient.        Other Visit Diagnoses    Need for pneumococcal vaccination        Relevant Orders    Pneumococcal conjugate vaccine 13-valent IM (Completed)    Need for prophylactic vaccination with tetanus-diphtheria (TD)        Relevant Orders    Td vaccine greater than or equal to 7yo preservative free IM (Completed)

## 2015-04-17 ENCOUNTER — Telehealth: Payer: Self-pay | Admitting: Family

## 2015-04-17 LAB — HEPATITIS C ANTIBODY: HCV Ab: NEGATIVE

## 2015-04-17 NOTE — Telephone Encounter (Signed)
Please inform the patient that all if his blood work is normal and his hepatitis C is negative.

## 2015-04-18 ENCOUNTER — Telehealth: Payer: Self-pay | Admitting: Family

## 2015-04-18 NOTE — Telephone Encounter (Signed)
Pt aware of results 

## 2015-04-18 NOTE — Telephone Encounter (Signed)
Please inform patient there is mild lung disease noted, however no active processes going on. Therefore we will continue to monitor at this time.

## 2015-04-19 NOTE — Telephone Encounter (Signed)
Pt aware.

## 2015-05-17 ENCOUNTER — Ambulatory Visit: Payer: Medicare PPO

## 2015-05-18 ENCOUNTER — Telehealth: Payer: Self-pay | Admitting: Family

## 2015-05-18 NOTE — Telephone Encounter (Signed)
Pt cancelled his injection for shingles and he just called to let us know he is getting tier 3 injection for shingles at [facility]

## 2015-06-04 ENCOUNTER — Telehealth: Payer: Self-pay | Admitting: Family

## 2015-06-04 NOTE — Telephone Encounter (Signed)
Pt states he gave Tammy Sours the original SCAT form and patient kept copy. He wants to be sure Tammy Sours turned form in

## 2015-06-04 NOTE — Telephone Encounter (Signed)
He was given the original form back because there were sections for him to complete. I made copies of our part of the form. If he does not have it then it will require another form.

## 2015-06-04 NOTE — Telephone Encounter (Signed)
Please advise. I do not have the form

## 2015-06-04 NOTE — Telephone Encounter (Signed)
Ladona Ridgel can you provide a status?

## 2015-06-05 NOTE — Telephone Encounter (Signed)
Called & left vm for patient.

## 2015-08-04 ENCOUNTER — Other Ambulatory Visit: Payer: Self-pay | Admitting: Family

## 2015-08-06 NOTE — Telephone Encounter (Signed)
Last refill 8/2 

## 2015-08-22 ENCOUNTER — Telehealth: Payer: Self-pay | Admitting: Family

## 2015-08-22 NOTE — Telephone Encounter (Signed)
Pt called in today stating his friend has been positively diagnosed with TB. We told him to call the health dept. If you has a hard time getting in today, please let Raynelle FanningJulie or Delaney Meigsamara know. They will try to get him in.

## 2015-10-03 ENCOUNTER — Ambulatory Visit (INDEPENDENT_AMBULATORY_CARE_PROVIDER_SITE_OTHER): Payer: Medicare PPO | Admitting: Family

## 2015-10-03 ENCOUNTER — Encounter: Payer: Self-pay | Admitting: Family

## 2015-10-03 VITALS — BP 144/84 | HR 81 | Temp 97.9°F | Resp 18 | Ht 71.0 in | Wt 120.8 lb

## 2015-10-03 DIAGNOSIS — F329 Major depressive disorder, single episode, unspecified: Secondary | ICD-10-CM | POA: Diagnosis not present

## 2015-10-03 DIAGNOSIS — R413 Other amnesia: Secondary | ICD-10-CM | POA: Diagnosis not present

## 2015-10-03 DIAGNOSIS — G629 Polyneuropathy, unspecified: Secondary | ICD-10-CM | POA: Insufficient documentation

## 2015-10-03 DIAGNOSIS — E785 Hyperlipidemia, unspecified: Secondary | ICD-10-CM

## 2015-10-03 DIAGNOSIS — F32A Depression, unspecified: Secondary | ICD-10-CM

## 2015-10-03 DIAGNOSIS — I10 Essential (primary) hypertension: Secondary | ICD-10-CM

## 2015-10-03 DIAGNOSIS — R2689 Other abnormalities of gait and mobility: Secondary | ICD-10-CM | POA: Insufficient documentation

## 2015-10-03 MED ORDER — MELOXICAM 15 MG PO TABS: ORAL_TABLET | ORAL | Status: DC

## 2015-10-03 MED ORDER — GABAPENTIN 300 MG PO CAPS: 300.0000 mg | ORAL_CAPSULE | Freq: Three times a day (TID) | ORAL | Status: DC

## 2015-10-03 MED ORDER — SERTRALINE HCL 25 MG PO TABS: 25.0000 mg | ORAL_TABLET | Freq: Every day | ORAL | Status: DC

## 2015-10-03 MED ORDER — HYDROCHLOROTHIAZIDE 12.5 MG PO TABS: 12.5000 mg | ORAL_TABLET | Freq: Every day | ORAL | Status: DC

## 2015-10-03 MED ORDER — PRAVASTATIN SODIUM 10 MG PO TABS: 10.0000 mg | ORAL_TABLET | Freq: Every day | ORAL | Status: DC

## 2015-10-03 NOTE — Assessment & Plan Note (Signed)
Hypertension is adequately controlled with current regimen. Takes medication as prescribed and denies adverse side effects. Continue current dosage of hydrochlorothiazide. Encouraged to monitor blood pressure at home. Follow-up for worsening or increasing blood pressure.

## 2015-10-03 NOTE — Assessment & Plan Note (Signed)
Neuropathy most likely related to orthopedic causes from bulging/herniated disks located in his cervical and lumbar spines respectively. Continue current conservative treatment with ice/heat and home exercise therapy. Increase gabapentin. If symptoms worsen or do not improve consider referral to orthopedics for potential cervical or lumbar injections as needed.

## 2015-10-03 NOTE — Progress Notes (Signed)
Pre visit review using our clinic review tool, if applicable. No additional management support is needed unless otherwise documented below in the visit note. 

## 2015-10-03 NOTE — Assessment & Plan Note (Signed)
MMSE score of 25/30 which is consistent with no significant cognitive impairment. Discussed results with patient and wishes to be referred to neurology for further evaluation. Question previous diagnosis of dementia in 2012 with no documentation or evidence noted. No medication is necessary at this time. Recommend working on games and puzzles to help keep his mind active.

## 2015-10-03 NOTE — Progress Notes (Signed)
Subjective:    Patient ID: Roberto Ros., male    DOB: 1940/09/08, 75 y.o.   MRN: [id number]  Chief Complaint  Patient presents with  . Follow-up    hypertension, needs refill of the pending medication, memory getting bad, referral to neurology having nerve pain    HPI:  Roberto Wallace. is a 75 y.o. male who  has a past medical history of Hypertension; Hypercholesteremia; Arthritis; Depression; Chicken pox; Syphilis; Migraine; and Stroke (HCC). and presents today for an office follow up.  1.)  Hypertension - Currently maintained on hydrochlorothiazide. Takes the medication as prescribed and denies adverse side effects. Does not currently take his blood pressure at home. Denies symptoms of end organ damage. Does continue smoke.   BP Readings from Last 3 Encounters:  10/03/15 144/84  04/16/15 124/86  03/27/15 140/88    2.) Neuropathy - Associated symptom of neuropathy going on from a previously diagnosed bulging/ruptured discs in his cervical and lumbar spine. Describes the pains as sharp and waxes and wanes with 2-3 occurances per day. Currently maintained on gabapentin which does help to relieve his symptoms. Does not that he has weakness in his arm and leg. Denies any recent falls.   3.) Memory issues - Associated symptom of forgetfulness has been going on since 2012 and was told he had dementia by [facility] nursing. Notes he forgets the location of items from time to time. Describes worsening of symptoms.   4.) Decreased mobility - Associated symptoms of decreased mobility has been progressively worsening over the past several months as a result of his chronic medical conditions.  He current uses a wheelchair when he goes out to perform his IADLs. Describes difficulty and weakness grabbing the wheel and propelling himself. He does have a very small house and would need a scooter for shopping and activities that would require him to walk greater than half a block. Is able to  complete his ADLs and has Meals on Wheels for food.  No Known Allergies   Current Outpatient Prescriptions on File Prior to Visit  Medication Sig Dispense Refill  . aspirin 81 MG tablet Take 81 mg by mouth daily.    . Multiple Vitamins-Minerals (CENTRUM SILVER ADULT 50+) TABS Take 1 capsule by mouth daily. 100 tablet 5  . nicotine (NICODERM CQ) 21 mg/24hr patch Place 1 patch (21 mg total) onto the skin daily. 28 patch 0   No current facility-administered medications on file prior to visit.    Past Medical History  Diagnosis Date  . Hypertension   . Hypercholesteremia   . Arthritis   . Depression   . Chicken pox   . Syphilis   . Migraine   . Stroke [facility])     No residual effects     Past Surgical History  Procedure Laterality Date  . Hernia repair    . Back surgery      Review of Systems  Constitutional:       Positive for decreased mobility  Respiratory: Negative for chest tightness, shortness of breath and wheezing.   Cardiovascular: Negative for chest pain, palpitations and leg swelling.  Musculoskeletal: Positive for back pain and neck pain.  Neurological: Positive for weakness and numbness. Negative for headaches.  Psychiatric/Behavioral: Negative for suicidal ideas and behavioral problems. The patient is not nervous/anxious.        Positive for decreased memory.      Objective:    BP 144/84 mmHg  Pulse 81  Temp(Src) 97.9 F (36.6 C) (Oral)  Resp 18  Ht  (1.803 m)  Wt 120 lb 12.8 oz (54.795 kg)  BMI 16.86 kg/m2  SpO2 96% Nursing note and vital signs reviewed.  Physical Exam  Constitutional: He is oriented to person, place, and time. He appears well-developed and well-nourished. No distress.  Cardiovascular: Normal rate, regular rhythm, normal heart sounds and intact distal pulses.   Pulmonary/Chest: Effort normal and breath sounds normal. He has no wheezes. He exhibits no tenderness.  Musculoskeletal:  Upper extremity strength of 4+ and  decreased grip strength noted bilaterally.  Neurological: He is alert and oriented to person, place, and time.  Skin: Skin is warm and dry.  Psychiatric: He has a normal mood and affect. His behavior is normal. Judgment and thought content normal.  MMSE score of 25/30       Assessment & Plan:   Problem List Items Addressed This Visit      Cardiovascular and Mediastinum   HYPERTENSION, BENIGN ESSENTIAL - Primary    Hypertension is adequately controlled with current regimen. Takes medication as prescribed and denies adverse side effects. Continue current dosage of hydrochlorothiazide. Encouraged to monitor blood pressure at home. Follow-up for worsening or increasing blood pressure.      Relevant Medications   pravastatin (PRAVACHOL) 10 MG tablet   hydrochlorothiazide (HYDRODIURIL) 12.5 MG tablet     Nervous and Auditory   Neuropathy (HCC)    Neuropathy most likely related to orthopedic causes from bulging/herniated disks located in his cervical and lumbar spines respectively. Continue current conservative treatment with ice/heat and home exercise therapy. Increase gabapentin. If symptoms worsen or do not improve consider referral to orthopedics for potential cervical or lumbar injections as needed.      Relevant Medications   meloxicam (MOBIC) 15 MG tablet   gabapentin (NEURONTIN) 300 MG capsule     Other   Depression   Relevant Medications   sertraline (ZOLOFT) 25 MG tablet   Intermittent memory loss    MMSE score of 25/30 which is consistent with no significant cognitive impairment. Discussed results with patient and wishes to be referred to neurology for further evaluation. Question previous diagnosis of dementia in 2012 with no documentation or evidence noted. No medication is necessary at this time. Recommend working on games and puzzles to help keep his mind active.      Relevant Orders   Ambulatory referral to Neurology   Decreased mobility    Decreased mobility  secondary to upper extremity weakness and comorbid conditions. Currently uses a wheelchair for long distances and is unable to propel independently secondary to decreased strength and endurance of his upper extremities. Max distance walked is approximately half a block before fatigue sets in. Walker or cane may not be enough to support his current situation. Recommend scooter for shopping activities and ambulation over long distances. Consider possible physical therapy to increase strength as tolerated.       Other Visit Diagnoses    Hyperlipidemia        Relevant Medications    pravastatin (PRAVACHOL) 10 MG tablet    hydrochlorothiazide (HYDRODIURIL) 12.5 MG tablet

## 2015-10-03 NOTE — Assessment & Plan Note (Signed)
Decreased mobility secondary to upper extremity weakness and comorbid conditions. Currently uses a wheelchair for long distances and is unable to propel independently secondary to decreased strength and endurance of his upper extremities. Max distance walked is approximately half a block before fatigue sets in. Walker or cane may not be enough to support his current situation. Recommend scooter for shopping activities and ambulation over long distances. Consider possible physical therapy to increase strength as tolerated.

## 2015-10-03 NOTE — Patient Instructions (Signed)
Thank you for choosing [facility].  Summary/Instructions:  Your prescription(s) have been submitted to your pharmacy or been printed and provided for you. Please take as directed and contact our office if you believe you are having problem(s) with the medication(s) or have any questions.  If your symptoms worsen or fail to improve, please contact our office for further instruction, or in case of emergency go directly to the emergency room at the closest medical facility.   Please increase her gabapentin to 300 mg 3 times per day.  They will call to schedule your referral for neurology.  Ice for your back and neck as needed for discomfort. If her symptoms continue to worsen, we will consider sending a to a back specialist for potential injections.  We will send in the paperwork for your scooter he should be hearing back something soon.

## 2015-10-09 ENCOUNTER — Telehealth: Payer: Self-pay | Admitting: Family

## 2015-10-09 NOTE — Telephone Encounter (Signed)
Pt called in and wanted to check on the status of the info that was suppose to be faxed [facility] to [facility] about his sc[facility].  They are telling him they have never rec'd it

## 2015-10-09 NOTE — Telephone Encounter (Signed)
Prescription written to be faxed.  

## 2015-10-11 NOTE — Telephone Encounter (Signed)
Rx faxed

## 2015-10-29 ENCOUNTER — Telehealth: Payer: Self-pay | Admitting: Family

## 2015-10-29 ENCOUNTER — Other Ambulatory Visit: Payer: Self-pay | Admitting: Infectious Disease

## 2015-10-29 ENCOUNTER — Ambulatory Visit
Admission: RE | Admit: 2015-10-29 | Discharge: 2015-10-29 | Disposition: A | Discharge status: Home / Self Care | Payer: No Typology Code available for payment source | Source: Ambulatory Visit | Attending: Infectious Disease | Admitting: Infectious Disease

## 2015-10-29 DIAGNOSIS — R7611 Nonspecific reaction to tuberculin skin test without active tuberculosis: Secondary | ICD-10-CM

## 2015-10-29 NOTE — Telephone Encounter (Signed)
[facility] just [facility] let [facility] know she weighed pt on Friday 3/3 and he weighed 104.5lbs.  His last appt here on 2/8 he weighed 120lbs  She just wants to be sure you check this out when he comes in on Wednesday.

## 2015-10-30 ENCOUNTER — Ambulatory Visit (INDEPENDENT_AMBULATORY_CARE_PROVIDER_SITE_OTHER): Payer: Medicaid Other | Admitting: Neurology

## 2015-10-30 ENCOUNTER — Other Ambulatory Visit (INDEPENDENT_AMBULATORY_CARE_PROVIDER_SITE_OTHER): Payer: Medicaid Other

## 2015-10-30 ENCOUNTER — Encounter: Payer: Self-pay | Admitting: Neurology

## 2015-10-30 VITALS — BP 110/78 | HR 95 | Ht 71.0 in | Wt 113.4 lb

## 2015-10-30 DIAGNOSIS — F329 Major depressive disorder, single episode, unspecified: Secondary | ICD-10-CM

## 2015-10-30 DIAGNOSIS — I10 Essential (primary) hypertension: Secondary | ICD-10-CM

## 2015-10-30 DIAGNOSIS — Z8673 Personal history of transient ischemic attack (TIA), and cerebral infarction without residual deficits: Secondary | ICD-10-CM | POA: Diagnosis not present

## 2015-10-30 DIAGNOSIS — E785 Hyperlipidemia, unspecified: Secondary | ICD-10-CM | POA: Diagnosis not present

## 2015-10-30 DIAGNOSIS — G3184 Mild cognitive impairment, so stated: Secondary | ICD-10-CM

## 2015-10-30 DIAGNOSIS — F32A Depression, unspecified: Secondary | ICD-10-CM

## 2015-10-30 DIAGNOSIS — F17200 Nicotine dependence, unspecified, uncomplicated: Secondary | ICD-10-CM

## 2015-10-30 LAB — VITAMIN B12: Vitamin B-12: 531 pg/mL (ref 211–911)

## 2015-10-30 LAB — FOLATE: Folate: >23.6 ng/mL (ref >5.9)

## 2015-10-30 NOTE — Progress Notes (Signed)
NEUROLOGY CONSULTATION NOTE  Roberto Wallace. MRN: [id number] DOB: [birth year]  Referring provider: Jeanine LuzGregory Calone, FNP Primary care provider: Jeanine LuzGregory Calone, FNP  Reason for consult:  Memory deficits  HISTORY OF PRESENT ILLNESS: Steward RosCharles O. Ofarrell Wallace is a 75 year old right-handed male with hypertension, hypercholesterolemia, depression, lumbar disc disease, and history of syphilis and stroke who presents for memory deficits.  History obtained by patient and PCP note.  He began noticing memory problems since 1994, following hernia repair.  He was bedbound for several weeks.  It caused stress and frustration.  He then reports that his belongings were accidentally removed from his apartment.  He had boxes in the hallway that were to be donated to the [employer] [facility].  When he was away, the people who came to pick up the boxes reportedly went inside his place and took his other belongings as well.  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  Due to his arthritis, he was unable to climb up and down the bus.  These events have contributed to a great deal of stress and depression, which has been a chronic issue.  Specifically, he reports short-term memory problems.  He lives by himself.  On several occasions, he left the stove on.  He rarely uses the stove and has meals on wheels.  He has a CNA come to the house to help clean.  He has a strict budget which helps him pay his bills without difficulty.  He usually only drives one block down to the grocery store.  If he drives farther locally, he uses GPS.  Otherwise, he feels he would get lost.  He has one older sister who lives nearby.  He has a friend who comes by the house once a week to check on him.  He denies trouble with familiar names or faces.  He is able to perform ADLs.    He had some college.  He denies family history of memory problems. He reports history of 3 TIAs.  MRI of brain  without contrast from 01/09/15 showed mild chronic small vessel ischemic changes and atrophy. TSH from 04/16/15 was 0.62.  LDL was 110. He takes ASA 81mg  daily and Pravachol.  PAST MEDICAL HISTORY: Past Medical History  Diagnosis Date  . Hypertension   . Hypercholesteremia   . Arthritis   . Depression   . Chicken pox   . Syphilis   . Migraine   . Stroke (HCC)     No residual effects    PAST SURGICAL HISTORY: Past Surgical History  Procedure Laterality Date  . Hernia repair    . Back surgery      MEDICATIONS: Current Outpatient Prescriptions on File Prior to Visit  Medication Sig Dispense Refill  . aspirin 81 MG tablet Take 81 mg by mouth daily.    Marland Kitchen. gabapentin (NEURONTIN) 300 MG capsule Take 1 capsule (300 mg total) by mouth 3 (three) times daily. 270 capsule 0  . hydrochlorothiazide (HYDRODIURIL) 12.5 MG tablet Take 1 tablet (12.5 mg total) by mouth daily. 90 tablet 1  . meloxicam (MOBIC) 15 MG tablet TAKE ONE TABLET BY MOUTH  DAILY 90 tablet 0  . Multiple Vitamins-Minerals (CENTRUM SILVER ADULT 50+) TABS Take 1 capsule by mouth daily. 100 tablet 5  . nicotine (NICODERM CQ) 21 mg/24hr patch Place 1 patch (21 mg total) onto the skin daily. 28 patch 0  . pravastatin (PRAVACHOL) 10  MG tablet Take 1 tablet (10 mg total) by mouth daily. 90 tablet 1  . sertraline (ZOLOFT) 25 MG tablet Take 1 tablet (25 mg total) by mouth daily. 90 tablet 1   No current facility-administered medications on file prior to visit.    ALLERGIES: No Known Allergies  FAMILY HISTORY: Family History  Problem Relation Age of Onset  . Healthy Mother   . Healthy Father     SOCIAL HISTORY: Social History   Social History  . Marital Status: Widowed    Spouse Name: N/A  . Number of Children: 2  . Years of Education: 14   Occupational History  . Retired    Social History Main Topics  . Smoking status: Current Every Day Smoker -- 0.33 packs/day for 50 years    Types: Cigarettes  . Smokeless  tobacco: Never Used  . Alcohol Use: 1.2 - 1.8 oz/week    2-3 Shots of liquor per week     Comment: social   . Drug Use: No  . Sexual Activity: No   Other Topics Concern  . Not on file   Social History Narrative   Fun: Sit on the back porch and relax.   Denies religious beliefs effecting health care.    Lives alone in a one story home.  Has 2 biological children and 3 adopted children.   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       REVIEW OF SYSTEMS: Constitutional: No fevers, chills, or sweats, no generalized fatigue, change in appetite Eyes: No visual changes, double vision, eye pain Ear, nose and throat: No hearing loss, ear pain, nasal congestion, sore throat Cardiovascular: No chest pain, palpitations Respiratory:  No shortness of breath at rest or with exertion, wheezes GastrointestinaI: No nausea, vomiting, diarrhea, abdominal pain, fecal incontinence Genitourinary:  No dysuria, urinary retention or frequency Musculoskeletal:  neck pain, back pain Integumentary: No rash, pruritus, skin lesions Neurological: as above Psychiatric: depression, anxiety Endocrine: No palpitations, fatigue, diaphoresis, mood swings, change in appetite, change in weight, increased thirst Hematologic/Lymphatic:  No anemia, purpura, petechiae. Allergic/Immunologic: no itchy/runny eyes, nasal congestion, recent allergic reactions, rashes  PHYSICAL EXAM: Filed Vitals:   10/30/15 0953  BP: 110/78  Pulse: 95   General: No acute distress.  Thin Head:  Normocephalic/atraumatic Eyes:  fundi unremarkable, without vessel changes, exudates, hemorrhages or papilledema. Neck: supple, no paraspinal tenderness, full range of motion Back: No paraspinal tenderness Heart: regular rate and rhythm Lungs: Clear to auscultation bilaterally. Vascular: No carotid bruits. Neurological Exam: Mental status: alert and oriented to person, place, and time, delayed recall poor, remote memory intact, fund  of knowledge intact, attention and concentration intact, speech fluent and not dysarthric, language intact. Montreal Cognitive Assessment  10/30/2015  Visuospatial/ Executive (0/5) 5  Naming (0/3) 3  Attention: Read list of digits (0/2) 2  Attention: Read list of letters (0/1) 1  Attention: Serial 7 subtraction starting at 100 (0/3) 1  Language: Repeat phrase (0/2) 2  Language : Fluency (0/1) 0  Abstraction (0/2) 2  Delayed Recall (0/5) 1  Orientation (0/6) 6  Total 23   Cranial nerves: CN I: not tested CN II: pupils equal, round and reactive to light, visual fields intact, fundi unremarkable, without vessel changes, exudates, hemorrhages or papilledema. CN III, IV, VI:  full range of motion, no nystagmus, no ptosis CN V: facial sensation intact CN VII: upper and lower face symmetric CN VIII: hearing intact CN IX, X: gag intact, uvula midline CN XI:  sternocleidomastoid and trapezius muscles intact CN XII: tongue midline Bulk & Tone: normal, no fasciculations. Motor:  5/5 throughout  Sensation: temperature and vibration sensation intact. Deep Tendon Reflexes:  2+ throughout, toes downgoing. Finger to nose testing:  Without dysmetria.  Heel to shin:  Without dysmetria.  Gait:  Antalgic gait with right limp and cane due to sprained ankle.  Able to turn, unable tandem walk. Romberg negative.  IMPRESSION: Mild cognitive impairment.  Extent related to depression/anxiety unknown History of TIAs Tobacco use  PLAN: 1.  ASA  daily for secondary stroke prevention 2.  Recommend PCP optimize statin for LDL goal less than 70 3.  Advised not to use stove 4.  Monitor memory for now 5.  Check B12, folate, RPR. 6.  Smoking cessation 7.  Follow up in 9 months.  Thank you for allowing me to take part in the care of this patient.  Shon Millet, DO  CC:  Jeanine Luz, FNP

## 2015-10-30 NOTE — Patient Instructions (Addendum)
1.  Will check RPR, B12 and folate 2.  Recommend not using the stove 3.  Will monitor memory 4.  Follow up in 9 months

## 2015-10-31 ENCOUNTER — Encounter: Payer: Self-pay | Admitting: Family

## 2015-10-31 ENCOUNTER — Other Ambulatory Visit (INDEPENDENT_AMBULATORY_CARE_PROVIDER_SITE_OTHER): Payer: Medicare PPO

## 2015-10-31 ENCOUNTER — Telehealth: Payer: Self-pay

## 2015-10-31 ENCOUNTER — Ambulatory Visit (INDEPENDENT_AMBULATORY_CARE_PROVIDER_SITE_OTHER): Payer: Medicare PPO | Admitting: Family

## 2015-10-31 VITALS — BP 118/78 | HR 99 | Temp 98.2°F | Resp 16 | Ht 71.0 in | Wt 113.0 lb

## 2015-10-31 DIAGNOSIS — R634 Abnormal weight loss: Secondary | ICD-10-CM | POA: Diagnosis not present

## 2015-10-31 DIAGNOSIS — Z9189 Other specified personal risk factors, not elsewhere classified: Secondary | ICD-10-CM

## 2015-10-31 LAB — CBC WITH DIFFERENTIAL/PLATELET
BASOS PCT: 0.4 % (ref 0.0–3.0)
Basophils Absolute: 0 10*3/uL (ref 0.0–0.1)
EOS ABS: 0.1 10*3/uL (ref 0.0–0.7)
EOS PCT: 1.8 % (ref 0.0–5.0)
HCT: 42 % (ref 39.0–52.0)
HEMOGLOBIN: 13.8 g/dL (ref 13.0–17.0)
LYMPHS PCT: 19.2 % (ref 12.0–46.0)
Lymphs Abs: 1.5 10*3/uL (ref 0.7–4.0)
MCHC: 32.9 g/dL (ref 30.0–36.0)
MCV: 88 fl (ref 78.0–100.0)
Monocytes Absolute: 0.8 10*3/uL (ref 0.1–1.0)
Monocytes Relative: 10.6 % (ref 3.0–12.0)
NEUTROS ABS: 5.2 10*3/uL (ref 1.4–7.7)
Neutrophils Relative %: 68 % (ref 43.0–77.0)
Platelets: 297 10*3/uL (ref 150.0–400.0)
RBC: 4.78 Mil/uL (ref 4.22–5.81)
RDW: 12.4 % (ref 11.5–15.5)
WBC: 7.6 10*3/uL (ref 4.0–10.5)

## 2015-10-31 LAB — COMPREHENSIVE METABOLIC PANEL
ALT: 13 U/L (ref 0–53)
AST: 14 U/L (ref 0–37)
Albumin: 3.9 g/dL (ref 3.5–5.2)
Alkaline Phosphatase: 83 U/L (ref 39–117)
BUN: 9 mg/dL (ref 6–23)
CALCIUM: 9.5 mg/dL (ref 8.4–10.5)
CHLORIDE: 99 meq/L (ref 96–112)
CO2: 30 meq/L (ref 19–32)
CREATININE: 0.74 mg/dL (ref 0.40–1.50)
GFR: 133.63 mL/min (ref >60.00)
Glucose, Bld: 74 mg/dL (ref 70–99)
POTASSIUM: 4 meq/L (ref 3.5–5.1)
SODIUM: 137 meq/L (ref 135–145)
Total Bilirubin: 0.4 mg/dL (ref 0.2–1.2)
Total Protein: 7.9 g/dL (ref 6.0–8.3)

## 2015-10-31 LAB — RPR

## 2015-10-31 LAB — HEMOGLOBIN A1C: HEMOGLOBIN A1C: 6.1 % (ref 4.6–6.5)

## 2015-10-31 LAB — TSH: TSH: 0.39 u[IU]/mL (ref 0.35–4.50)

## 2015-10-31 LAB — PSA: PSA: 2.74 ng/mL (ref 0.10–4.00)

## 2015-10-31 NOTE — Progress Notes (Signed)
Pre visit review using our clinic review tool, if applicable. No additional management support is needed unless otherwise documented below in the visit note. 

## 2015-10-31 NOTE — Assessment & Plan Note (Signed)
Loss of weight of a proximally 7 pounds over the past 1 months time. Which equates to approximately 6% of his body weight. Indicates he is eating adequately and has good access to food. Obtain CBC with differential, comprehensive metabolic panel, hemoglobin A1c, HIV, PSA, and TSH to rule out metabolic causes. Previous chest x-ray completed one day ago was negative for acute findings with chronic lung disease noted. There is concern is he has worked with a spasticity for significant amount of time. Encouraged to continue consuming nutritional supplements and eating a balanced diet that is focused on calorie dense foods. Follow-up pending blood work.

## 2015-10-31 NOTE — Telephone Encounter (Signed)
-----   Message from Drema DallasAdam R Jaffe, DO sent at 10/31/2015  7:44 AM EST ----- Labs are normal

## 2015-10-31 NOTE — Progress Notes (Signed)
Subjective:    Patient ID: Roberto Rosharles O Diver [name], male    DOB: 06-10-1941, 75 y.o.   MRN: [id number]  Chief Complaint  Patient presents with  . Follow-up    weight loss and says he is over due for colonoscopy    HPI:  Roberto RosCharles O Gilmore [name] is a 75 y.o. male who  has a past medical history of Hypertension; Hypercholesteremia; Arthritis; Depression; Chicken pox; Syphilis; Migraine; and Stroke (HCC). and presents today for a follow up office visit.   1.) Loss of weight - This is a new problem. Recently seen in the [facility] and noted that he has had a significant amount of weight loss over the past couple of months. Nutritionally eating about 1 meal per day and possibly nibbles all day long. States that he has plenty of food, but is not able to cook it. He is able to use the microwave. Recently seen by neurology, and advised not to use the stove secondary to cognitive concerns. 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   Wt Readings from Last 3 Encounters:  10/31/15 113 lb (51.256 kg)  10/30/15 113 lb 7 oz (51.455 kg)  10/03/15 120 lb 12.8 oz (54.795 kg)    No Known Allergies   Current Outpatient Prescriptions on File Prior to Visit  Medication Sig Dispense Refill  . aspirin 81 MG tablet Take 81 mg by mouth daily.    Marland Kitchen. FLUZONE HIGH-DOSE 0.5 ML SUSY     . gabapentin (NEURONTIN) 300 MG capsule Take 1 capsule (300 mg total) by mouth 3 (three) times daily. 270 capsule 0  . hydrochlorothiazide (HYDRODIURIL) 12.5 MG tablet Take 1 tablet (12.5 mg total) by mouth daily. 90 tablet 1  . meloxicam (MOBIC) 15 MG tablet TAKE ONE TABLET BY MOUTH  DAILY 90 tablet 0  . Multiple Vitamins-Minerals (CENTRUM SILVER ADULT 50+) TABS Take 1 capsule by mouth daily. 100 tablet 5  . nicotine (NICODERM CQ) 21 mg/24hr patch Place 1 patch (21 mg total) onto the skin daily. 28 patch 0  . pravastatin (PRAVACHOL) 10 MG tablet Take 1 tablet (10 mg total) by mouth daily. 90 tablet 1  . sertraline  (ZOLOFT) 25 MG tablet Take 1 tablet (25 mg total) by mouth daily. 90 tablet 1   No current facility-administered medications on file prior to visit.     Review of Systems  Constitutional: Positive for unexpected weight change. Negative for fever and chills.  Respiratory: Negative for chest tightness and shortness of breath.   Cardiovascular: Negative for palpitations and leg swelling.  Gastrointestinal: Negative for nausea, vomiting, abdominal pain, diarrhea, constipation, blood in stool, abdominal distention, anal bleeding and rectal pain.      Objective:    BP 118/78 mmHg  Pulse 99  Temp(Src) 98.2 F (36.8 C) (Oral)  Resp 16  Ht 5\' 11"  (1.803 m)  Wt 113 lb (51.256 kg)  BMI 15.77 kg/m2  SpO2 97% Nursing note and vital signs reviewed.  Physical Exam  Constitutional: He is oriented to person, place, and time. He appears well-developed and well-nourished. No distress.  Cardiovascular: Normal rate, regular rhythm, normal heart sounds and intact distal pulses.   Pulmonary/Chest: Effort normal and breath sounds normal.  Abdominal: Soft. Bowel sounds are normal. He exhibits no distension and no mass. There is no tenderness. There is no rebound and no guarding.  Neurological: He is alert and oriented to person, place, and time.  Skin: Skin is warm and dry.  Psychiatric:  He has a normal mood and affect. His behavior is normal. Judgment and thought content normal.       Assessment & Plan:   Problem List Items Addressed This Visit      Other   Loss of weight - Primary    Loss of weight of a proximally 7 pounds over the past 1 months time. Which equates to approximately 6% of his body weight. Indicates he is eating adequately and has good access to food. Obtain CBC with differential, comprehensive metabolic panel, hemoglobin A1c, HIV, PSA, and TSH to rule out metabolic causes. Previous chest x-ray completed one day ago was negative for acute findings with chronic lung disease noted.  There is concern is he has worked with a spasticity for significant amount of time. Encouraged to continue consuming nutritional supplements and eating a balanced diet that is focused on calorie dense foods. Follow-up pending blood work.      Relevant Orders   TSH   Hemoglobin A1c   PSA   HIV antibody   Comprehensive metabolic panel   CBC w/Diff    Other Visit Diagnoses    At risk for colon cancer        Relevant Orders    Ambulatory referral to Gastroenterology

## 2015-10-31 NOTE — Patient Instructions (Signed)
Thank you for choosing [facility].  Summary/Instructions:  Continue to drink Ensure or Meal Supplement.  Continue to take your medication as prescribed.                      Please stop by the lab on the basement level of the building for your blood work. Your results will be released to MyChart (or called to you) after review, usually within 72 hours after test completion. If any changes need to be made, you will be notified at that same time.  If your symptoms worsen or fail to improve, please contact our office for further instruction, or in case of emergency go directly to the emergency room at the closest medical facility.

## 2015-10-31 NOTE — Telephone Encounter (Signed)
Left message on machine for pt to return call to the office.  

## 2015-11-01 ENCOUNTER — Telehealth: Payer: Self-pay | Admitting: Family

## 2015-11-01 LAB — HIV ANTIBODY (ROUTINE TESTING W REFLEX): HIV 1&2 Ab, 4th Generation: NONREACTIVE

## 2015-11-01 NOTE — Telephone Encounter (Signed)
Noted and spoke with patient during office visit.

## 2015-11-01 NOTE — Telephone Encounter (Signed)
Please inform patient that his blood work is all within the normal limits with no significant evidence for his weight loss. Therefore I would like him to follow up in a month for a weight check as a nurse visit to ensure his weight is at least stable.

## 2015-11-01 NOTE — Telephone Encounter (Signed)
Pt called in giving me an website [employer]health.[place].gov/form10-5345a I called him back and left msg stating he will need to pull this form up and it needs to be filled out and signed by him and bring it back to this office.

## 2015-11-02 NOTE — Telephone Encounter (Signed)
Pt informed of results.

## 2015-11-23 ENCOUNTER — Telehealth: Payer: Self-pay | Admitting: Family

## 2015-11-23 NOTE — Telephone Encounter (Signed)
Needs OV notes from 3/8 for patient because [facility] Department doctor is worried about weight loss. Can be faxed to [phone]

## 2015-11-26 NOTE — Telephone Encounter (Signed)
OV notes have been faxed.

## 2015-11-29 ENCOUNTER — Other Ambulatory Visit: Payer: Self-pay | Admitting: Family

## 2015-11-29 ENCOUNTER — Telehealth: Payer: Self-pay | Admitting: Family

## 2015-11-29 DIAGNOSIS — R634 Abnormal weight loss: Secondary | ICD-10-CM

## 2015-11-29 DIAGNOSIS — R7611 Nonspecific reaction to tuberculin skin test without active tuberculosis: Secondary | ICD-10-CM

## 2015-11-29 NOTE — Telephone Encounter (Addendum)
Ludger Nutting[facility]enee [facility] would like to know if the order for the patient to receive a CT of chest, stomach and pelvis has gone through.  This is for an evaluation for possible Miliary TB vs other. Please call her on this.  Also, she needs copies of labs done on 10/31/15. Please fax the lab results to Ludger Nuttingenee Carpenter at [phone].

## 2015-11-29 NOTE — Telephone Encounter (Signed)
Faxed lab results to number listed.

## 2015-12-04 ENCOUNTER — Encounter: Payer: Medicare PPO | Admitting: Family

## 2015-12-10 ENCOUNTER — Encounter: Payer: Self-pay | Admitting: Family

## 2015-12-14 ENCOUNTER — Inpatient Hospital Stay: Admission: RE | Admit: 2015-12-14 | Payer: Medicare PPO | Source: Ambulatory Visit

## 2015-12-20 ENCOUNTER — Other Ambulatory Visit: Payer: Self-pay

## 2015-12-20 DIAGNOSIS — F17200 Nicotine dependence, unspecified, uncomplicated: Secondary | ICD-10-CM

## 2015-12-20 DIAGNOSIS — R7611 Nonspecific reaction to tuberculin skin test without active tuberculosis: Secondary | ICD-10-CM

## 2015-12-21 ENCOUNTER — Other Ambulatory Visit: Payer: Self-pay | Admitting: Family

## 2015-12-21 DIAGNOSIS — R7611 Nonspecific reaction to tuberculin skin test without active tuberculosis: Secondary | ICD-10-CM

## 2015-12-21 DIAGNOSIS — R634 Abnormal weight loss: Secondary | ICD-10-CM

## 2015-12-27 ENCOUNTER — Encounter (INDEPENDENT_AMBULATORY_CARE_PROVIDER_SITE_OTHER): Payer: Self-pay

## 2015-12-27 ENCOUNTER — Ambulatory Visit
Admission: RE | Admit: 2015-12-27 | Discharge: 2015-12-27 | Disposition: A | Discharge status: Home / Self Care | Payer: Medicare PPO | Source: Ambulatory Visit | Attending: Family | Admitting: Family

## 2015-12-27 DIAGNOSIS — R634 Abnormal weight loss: Secondary | ICD-10-CM

## 2015-12-27 DIAGNOSIS — E278 Other specified disorders of adrenal gland: Secondary | ICD-10-CM | POA: Diagnosis not present

## 2015-12-27 DIAGNOSIS — R918 Other nonspecific abnormal finding of lung field: Secondary | ICD-10-CM | POA: Diagnosis not present

## 2015-12-27 DIAGNOSIS — R7611 Nonspecific reaction to tuberculin skin test without active tuberculosis: Secondary | ICD-10-CM

## 2015-12-27 DIAGNOSIS — I714 Abdominal aortic aneurysm, without rupture: Secondary | ICD-10-CM | POA: Diagnosis not present

## 2015-12-27 MED ORDER — IOPAMIDOL (ISOVUE-300) INJECTION 61%
100.0000 mL | Freq: Once | INTRAVENOUS | Status: AC | PRN
  Administered 2015-12-27: 100 mL via INTRAVENOUS

## 2015-12-28 ENCOUNTER — Telehealth: Payer: Self-pay | Admitting: Family

## 2015-12-28 NOTE — Telephone Encounter (Signed)
Notified pt w/Greg response.../lmb 

## 2015-12-28 NOTE — Telephone Encounter (Signed)
Please inform Mr. Roberto Wallace that he needs to go to the [facility] as his CT scan is consistent with Tuberculosis. We can discuss the other findings through an office visit.

## 2015-12-31 NOTE — Telephone Encounter (Signed)
Per health department I faxed over all CT results for pt.

## 2016-01-25 DIAGNOSIS — A150 Tuberculosis of lung: Secondary | ICD-10-CM | POA: Diagnosis not present

## 2016-01-25 LAB — HEPATIC FUNCTION PANEL
ALT: 25 U/L (ref 10–40)
AST: 24 U/L (ref 14–40)
Alkaline Phosphatase: 73 U/L (ref 25–125)
Bilirubin, Direct: 0.1 mg/dL

## 2016-01-25 LAB — CBC AND DIFFERENTIAL
HEMATOCRIT: 36 % — AB (ref 41–53)
HEMOGLOBIN: 11.5 g/dL — AB (ref 13.5–17.5)
Platelets: 305 10*3/uL (ref 150–399)
WBC: 6.1 10^3/mL

## 2016-02-05 ENCOUNTER — Ambulatory Visit (INDEPENDENT_AMBULATORY_CARE_PROVIDER_SITE_OTHER): Payer: Medicare PPO | Admitting: Family

## 2016-02-05 ENCOUNTER — Ambulatory Visit (INDEPENDENT_AMBULATORY_CARE_PROVIDER_SITE_OTHER)
Admission: RE | Admit: 2016-02-05 | Discharge: 2016-02-05 | Disposition: A | Discharge status: Home / Self Care | Payer: Medicare PPO | Source: Ambulatory Visit | Attending: Family | Admitting: Family

## 2016-02-05 ENCOUNTER — Encounter: Payer: Self-pay | Admitting: Family

## 2016-02-05 ENCOUNTER — Telehealth: Payer: Self-pay | Admitting: Family

## 2016-02-05 VITALS — BP 120/86 | HR 111 | Temp 98.4°F | Wt 108.0 lb

## 2016-02-05 DIAGNOSIS — A150 Tuberculosis of lung: Secondary | ICD-10-CM

## 2016-02-05 DIAGNOSIS — F17200 Nicotine dependence, unspecified, uncomplicated: Secondary | ICD-10-CM | POA: Diagnosis not present

## 2016-02-05 DIAGNOSIS — M150 Primary generalized (osteo)arthritis: Secondary | ICD-10-CM

## 2016-02-05 DIAGNOSIS — M1990 Unspecified osteoarthritis, unspecified site: Secondary | ICD-10-CM | POA: Insufficient documentation

## 2016-02-05 DIAGNOSIS — E785 Hyperlipidemia, unspecified: Secondary | ICD-10-CM

## 2016-02-05 DIAGNOSIS — M25562 Pain in left knee: Secondary | ICD-10-CM | POA: Diagnosis not present

## 2016-02-05 DIAGNOSIS — M159 Polyosteoarthritis, unspecified: Secondary | ICD-10-CM

## 2016-02-05 DIAGNOSIS — A159 Respiratory tuberculosis unspecified: Secondary | ICD-10-CM | POA: Insufficient documentation

## 2016-02-05 DIAGNOSIS — I10 Essential (primary) hypertension: Secondary | ICD-10-CM

## 2016-02-05 DIAGNOSIS — M8949 Other hypertrophic osteoarthropathy, multiple sites: Secondary | ICD-10-CM

## 2016-02-05 DIAGNOSIS — M25561 Pain in right knee: Secondary | ICD-10-CM | POA: Diagnosis not present

## 2016-02-05 DIAGNOSIS — G629 Polyneuropathy, unspecified: Secondary | ICD-10-CM

## 2016-02-05 MED ORDER — MELOXICAM 15 MG PO TABS: ORAL_TABLET | ORAL | Status: DC

## 2016-02-05 MED ORDER — HYDROCHLOROTHIAZIDE 12.5 MG PO TABS: 12.5000 mg | ORAL_TABLET | Freq: Every day | ORAL | Status: DC

## 2016-02-05 MED ORDER — PRAVASTATIN SODIUM 10 MG PO TABS: 10.0000 mg | ORAL_TABLET | Freq: Every day | ORAL | Status: DC

## 2016-02-05 MED ORDER — BUPROPION HCL ER (SMOKING DET) 150 MG PO TB12: ORAL_TABLET | ORAL | Status: DC

## 2016-02-05 MED ORDER — GABAPENTIN 300 MG PO CAPS: 300.0000 mg | ORAL_CAPSULE | Freq: Three times a day (TID) | ORAL | Status: DC

## 2016-02-05 NOTE — Patient Instructions (Signed)
Thank you for choosing [facility].  Summary/Instructions:  Please continue to take your medications as prescribed.  Buproprion for smoking cessation.  Your prescription(s) have been submitted to your pharmacy or been printed and provided for you. Please take as directed and contact our office if you believe you are having problem(s) with the medication(s) or have any questions.  Please stop by radiology on the basement level of the building for your x-rays. Your results will be released to MyChart (or called to you) after review, usually within 72 hours after test completion. If any treatments or changes are necessary, you will be notified at that same time.  If your symptoms worsen or fail to improve, please contact our office for further instruction, or in case of emergency go directly to the emergency room at the closest medical facility.   Osteoarthritis Osteoarthritis is a disease that causes soreness and inflammation of a joint. It occurs when the cartilage at the affected joint wears down. Cartilage acts as a cushion, covering the ends of bones where they meet to form a joint. Osteoarthritis is the most common form of arthritis. It often occurs in older people. The joints affected most often by this condition include those in the:  Ends of the fingers.  Thumbs.  Neck.  Lower back.  Knees.  Hips. CAUSES  Over time, the cartilage that covers the ends of bones begins to wear away. This causes bone to rub on bone, producing pain and stiffness in the affected joints.  RISK FACTORS Certain factors can increase your chances of having osteoarthritis, including:  Older age.  Excessive body weight.  Overuse of joints.  Previous joint injury. SIGNS AND SYMPTOMS   Pain, swelling, and stiffness in the joint.  Over time, the joint may lose its normal shape.  Small deposits of bone (osteophytes) may grow on the edges of the joint.  Bits of bone or cartilage can break  off and float inside the joint space. This may cause more pain and damage. DIAGNOSIS  Your health care provider will do a physical exam and ask about your symptoms. Various tests may be ordered, such as:  X-rays of the affected joint.  Blood tests to rule out other types of arthritis. Additional tests may be used to diagnose your condition. TREATMENT  Goals of treatment are to control pain and improve joint function. Treatment plans may include:  A prescribed exercise program that allows for rest and joint relief.  A weight control plan.  Pain relief techniques, such as:  Properly applied heat and cold.  Electric pulses delivered to nerve endings under the skin (transcutaneous electrical nerve stimulation [TENS]).  Massage.  Certain nutritional supplements.  Medicines to control pain, such as:  Acetaminophen.  Nonsteroidal anti-inflammatory drugs (NSAIDs), such as naproxen.  Narcotic or central-acting agents, such as tramadol.  Corticosteroids. These can be given orally or as an injection.  Surgery to reposition the bones and relieve pain (osteotomy) or to remove loose pieces of bone and cartilage. Joint replacement may be needed in advanced states of osteoarthritis. HOME CARE INSTRUCTIONS   Take medicines only as directed by your health care provider.  Maintain a healthy weight. Follow your health care provider's instructions for weight control. This may include dietary instructions.  Exercise as directed. Your health care provider can recommend specific types of exercise. These may include:  Strengthening exercises. These are done to strengthen the muscles that support joints affected by arthritis. They can be performed with weights or with  exercise bands to add resistance.  Aerobic activities. These are exercises, such as brisk walking or low-impact aerobics, that get your heart pumping.  Range-of-motion activities. These keep your joints limber.  Balance and  agility exercises. These help you maintain daily living skills.  Rest your affected joints as directed by your health care provider.  Keep all follow-up visits as directed by your health care provider. SEEK MEDICAL CARE IF:   Your skin turns red.  You develop a rash in addition to your joint pain.  You have worsening joint pain.  You have a fever along with joint or muscle aches. SEEK IMMEDIATE MEDICAL CARE IF:  You have a significant loss of weight or appetite.  You have night sweats. FOR MORE INFORMATION   National Institute of Arthritis and Musculoskeletal and Skin Diseases: www.niams.http://www.myers.net/nih.gov  [employer] Aging: https://walker.com/www.nia.nih.gov  American College of Rheumatology: www.rheumatology.org   This information is not intended to replace advice given to you by your health care provider. Make sure you discuss any questions you have with your health care provider.   Document Released: 08/11/2005 Document Revised: 09/01/2014 Document Reviewed: 04/18/2013 Elsevier Interactive Patient Education [employer]2016 [employer]vier Inc.

## 2016-02-05 NOTE — Progress Notes (Signed)
Subjective:    Patient ID: Roberto Ros., male    DOB: 1940/12/15, 75 y.o.   MRN: [id number]  Chief Complaint  Patient presents with  . Follow-up  . Nicotine Dependence  . Knee Pain    HPI:  Roberto Wallace. is a 75 y.o. male who  has a past medical history of Hypertension; Hypercholesteremia; Arthritis; Depression; Chicken pox; Syphilis; Migraine; and Stroke (HCC). and presents today for a follow up office visit.  1.) TB - Recently tested positive for tuberculosis and currently being treated by the health department. Currently maintained on Isonazid, rifampin, pyrazinamide, and ethambutol. Reports taken medications prescribed and notes occasional diarrhea with no other adverse side effects. Continues to eat softer foods as tolerated. Denies any fevers, chills, cough or night sweats.   2.) Bilateral knees - Associated symptom of bilateral knee pain has been going on for approximately 15 years. Pain is described as sharp with walking at times. Also experiences giving out of the knees on occasion. Modifying factors include meloxicam. Aggravated with weightbearing and improved gradually with nonweightbearing. Describes "the cartilage in my knees are gone".  Wt Readings from Last 3 Encounters:  02/05/16 108 lb (48.988 kg)  10/31/15 113 lb (51.256 kg)  10/30/15 113 lb 7 oz (51.455 kg)   3.) Tobacco cessation - Expresses desire for assistance with tobacco cessation with reported failed attempts with patches and gums. Is interested in start Zyban to help. Indicates that he is ready to quit smoking to try and improve his overall health.   No Known Allergies   Current Outpatient Prescriptions on File Prior to Visit  Medication Sig Dispense Refill  . Multiple Vitamins-Minerals (CENTRUM SILVER ADULT 50+) TABS Take 1 capsule by mouth daily. 100 tablet 5  . sertraline (ZOLOFT) 25 MG tablet Take 1 tablet (25 mg total) by mouth daily. 90 tablet 1  . aspirin 81 MG tablet Take 81 mg  by mouth daily. Reported on 02/05/2016    . FLUZONE HIGH-DOSE 0.5 ML SUSY Reported on 02/05/2016    . nicotine (NICODERM CQ) 21 mg/24hr patch Place 1 patch (21 mg total) onto the skin daily. (Patient not taking: Reported on 02/05/2016) 28 patch 0   No current facility-administered medications on file prior to visit.     Past Surgical History  Procedure Laterality Date  . Hernia repair    . Back surgery       Past Medical History  Diagnosis Date  . Hypertension   . Hypercholesteremia   . Arthritis   . Depression   . Chicken pox   . Syphilis   . Migraine   . Stroke (HCC)     No residual effects     Review of Systems  Constitutional: Negative for fever and chills.  Respiratory: Negative for cough, chest tightness and shortness of breath.   Cardiovascular: Negative for chest pain, palpitations and leg swelling.  Musculoskeletal:       Positive for bilateral knee pain.       Objective:    BP 120/86 mmHg  Pulse 111  Temp(Src) 98.4 F (36.9 C)  Wt 108 lb (48.988 kg)  SpO2 99% Nursing note and vital signs reviewed.  Physical Exam  Constitutional: He is oriented to person, place, and time. He appears cachectic. He has a sickly appearance. No distress.  Cardiovascular: Normal rate, regular rhythm, normal heart sounds and intact distal pulses.   Pulmonary/Chest: Effort normal and breath sounds normal.  Musculoskeletal:  Bilateral knees -  No obvious deformity, discoloration or edema. Appears with thin with significant atrophy and walking with crutches. No significant pain able to be elicited. Range of motion is within normal limits. Strength is 4+. Distal pulses and sensation are intact and appropriate. Ligamentous testing and meniscal testing is normal.  Neurological: He is alert and oriented to person, place, and time.  Skin: Skin is warm and dry. He is not diaphoretic.  Psychiatric: He has a normal mood and affect. His behavior is normal. Judgment and thought content  normal.       Assessment & Plan:   Problem List Items Addressed This Visit      Cardiovascular and Mediastinum   HYPERTENSION, BENIGN ESSENTIAL   Relevant Medications   hydrochlorothiazide (HYDRODIURIL) 12.5 MG tablet   pravastatin (PRAVACHOL) 10 MG tablet     Nervous and Auditory   Neuropathy (HCC)   Relevant Medications   gabapentin (NEURONTIN) 300 MG capsule   meloxicam (MOBIC) 15 MG tablet     Musculoskeletal and Integument   Osteoarthritis - Primary    Symptoms and exam consistent with primary osteoarthritis most likely related to progressive overuse. There is significant atrophy. Obtain x-rays to rule out tricompartmental osteoarthritis. Treat conservatively with continued current dosage of meloxicam, ice, and home exercise therapy. Follow-up pending x-ray results.      Relevant Medications   meloxicam (MOBIC) 15 MG tablet   Other Relevant Orders   DG Knee Complete 4 Views Left (Completed)   DG Knee Complete 4 Views Right (Completed)     Other   TOBACCO USER    Expresses wishes to discontinue tobacco use. Previous treatment failure with gums and patches. Discussed risks and proper usage of medications. Start Zyban. Smoking cessation instructions and resources provided. Follow-up in one month or sooner if needed.      Relevant Medications   buPROPion (ZYBAN) 150 MG 12 hr tablet   Tuberculosis    Previously evaluated in the office and health department with concern for tuberculosis confirmed on CT scan. Treatment is ongoing through the Health Department. Continue current dosage of Isonazid, Rifampin, Pyrazinamide, ethambutol with changes per Health Department.        Other Visit Diagnoses    Hyperlipidemia        Relevant Medications    hydrochlorothiazide (HYDRODIURIL) 12.5 MG tablet    pravastatin (PRAVACHOL) 10 MG tablet        I have discontinued Roberto Wallace's buPROPion. I am also having him start on buPROPion. Additionally, I am having him maintain his  CENTRUM SILVER ADULT 50+, nicotine, aspirin, sertraline, FLUZONE HIGH-DOSE, phenylephrine, vitamin B-12, vitamin E, bisacodyl, gabapentin, hydrochlorothiazide, meloxicam, pravastatin, isoniazid, rifampin, pyrazinamide, and ethambutol.   Meds ordered this encounter  Medications  . DISCONTD: buPROPion (WELLBUTRIN SR) 150 MG 12 hr tablet    Sig: Take 150 mg by mouth 2 (two) times daily.  . phenylephrine (SUDAFED PE) 10 MG TABS tablet    Sig: Take 10 mg by mouth every 4 (four) hours as needed.  . vitamin B-12 (CYANOCOBALAMIN) 100 MCG tablet    Sig: Take 100 mcg by mouth daily.  . vitamin E 100 UNIT capsule    Sig: Take by mouth daily.  . bisacodyl (DULCOLAX) 10 MG suppository    Sig: Place 10 mg rectally as needed for moderate constipation.  Marland Kitchen. buPROPion (ZYBAN) 150 MG 12 hr tablet    Sig: Take 1 tablet by mouth daily for 3 days then 1 tablet by mouth twice daily.    Dispense:  60 tablet    Refill:  0    Order Specific Question:  Supervising Provider    Answer:  Hillard Danker A [[id number]]  . gabapentin (NEURONTIN) 300 MG capsule    Sig: Take 1 capsule (300 mg total) by mouth 3 (three) times daily.    Dispense:  270 capsule    Refill:  0    Order Specific Question:  Supervising Provider    Answer:  Hillard Danker A [[id number]]  . hydrochlorothiazide (HYDRODIURIL) 12.5 MG tablet    Sig: Take 1 tablet (12.5 mg total) by mouth daily.    Dispense:  90 tablet    Refill:  1    Order Specific Question:  Supervising Provider    Answer:  Hillard Danker A [[id number]]  . meloxicam (MOBIC) 15 MG tablet    Sig: TAKE ONE TABLET BY MOUTH  DAILY    Dispense:  90 tablet    Refill:  0    Order Specific Question:  Supervising Provider    Answer:  Hillard Danker A [[id number]]  . pravastatin (PRAVACHOL) 10 MG tablet    Sig: Take 1 tablet (10 mg total) by mouth daily.    Dispense:  90 tablet    Refill:  1    Order Specific Question:  Supervising Provider    Answer:  Hillard Danker A [[id number]]  .  isoniazid (NYDRAZID) 300 MG tablet    Sig: Take 300 mg by mouth daily.  . rifampin (RIFADIN) 300 MG capsule    Sig: Take 300 mg by mouth 2 (two) times daily.  . pyrazinamide 500 MG tablet    Sig: Take 500 mg by mouth 2 (two) times daily.  Marland Kitchen ethambutol (MYAMBUTOL) 400 MG tablet    Sig: Take 400 mg by mouth 2 (two) times daily.     Follow-up: No Follow-up on file.  Jeanine Luz, FNP

## 2016-02-05 NOTE — Assessment & Plan Note (Signed)
Expresses wishes to discontinue tobacco use. Previous treatment failure with gums and patches. Discussed risks and proper usage of medications. Start Zyban. Smoking cessation instructions and resources provided. Follow-up in one month or sooner if needed.

## 2016-02-05 NOTE — Assessment & Plan Note (Signed)
Previously evaluated in the office and health department with concern for tuberculosis confirmed on CT scan. Treatment is ongoing through the Health Department. Continue current dosage of Isonazid, Rifampin, Pyrazinamide, ethambutol with changes per Health Department.

## 2016-02-05 NOTE — Progress Notes (Signed)
Pre visit review using our clinic review tool, if applicable. No additional management support is needed unless otherwise documented below in the visit note. 

## 2016-02-05 NOTE — Telephone Encounter (Signed)
Please inform patient there is minimal osteoarthritis in his right knee with a moderate change in his left knee. The next step would be to work on improving his strength including physical therapy and possible cortisone injections.

## 2016-02-05 NOTE — Assessment & Plan Note (Signed)
Symptoms and exam consistent with primary osteoarthritis most likely related to progressive overuse. There is significant atrophy. Obtain x-rays to rule out tricompartmental osteoarthritis. Treat conservatively with continued current dosage of meloxicam, ice, and home exercise therapy. Follow-up pending x-ray results.

## 2016-02-06 NOTE — Telephone Encounter (Signed)
Pt aware of results. Wants to know if it is safe for him to take the injections while taking all the medication needed for TB

## 2016-02-12 ENCOUNTER — Telehealth: Payer: Self-pay

## 2016-02-12 ENCOUNTER — Encounter: Payer: Self-pay | Admitting: Family

## 2016-02-12 LAB — RBC
MCH: 31.6
MCHC: 15
MCV: 84
RBC: 4.34
RDW: 305

## 2016-02-12 NOTE — Telephone Encounter (Signed)
Noted  

## 2016-02-12 NOTE — Telephone Encounter (Signed)
Roberto NuttingRenee Wallace called from the health department to let you know that pts sputum test came back positive for being contagious with TB. Lab work is being checked by them every 6-8 weeks. States that the office visits should be limited as much as possible to prevent the spread of the infection. She just wanted us to be aware.

## 2016-02-13 NOTE — Telephone Encounter (Signed)
Delaney Meigsamara has huddled in clinic and also advised dustin/office team lead---we need to exercise n95 masks until we hear from health dept should the patient come in for a visit

## 2016-02-15 ENCOUNTER — Encounter: Payer: Self-pay | Admitting: Family

## 2016-02-19 NOTE — Telephone Encounter (Signed)
Front office notified.

## 2016-02-28 DIAGNOSIS — A150 Tuberculosis of lung: Secondary | ICD-10-CM | POA: Diagnosis not present

## 2016-03-06 ENCOUNTER — Telehealth: Payer: Self-pay

## 2016-03-06 ENCOUNTER — Encounter: Payer: Self-pay | Admitting: Family

## 2016-03-06 ENCOUNTER — Ambulatory Visit (INDEPENDENT_AMBULATORY_CARE_PROVIDER_SITE_OTHER): Payer: Medicare PPO | Admitting: Family

## 2016-03-06 VITALS — BP 132/82 | HR 100 | Temp 98.4°F | Wt 118.0 lb

## 2016-03-06 DIAGNOSIS — R2689 Other abnormalities of gait and mobility: Secondary | ICD-10-CM | POA: Diagnosis not present

## 2016-03-06 NOTE — Telephone Encounter (Signed)
Patient is on schedule to see greg calone today---i have called renee carpenter at [facility]---[phone]----she is advising that patient does not need office visit, patient is wanting forms signed for wheelchair approval---per greg, he can sign forms without seeing patient---i have called patient and left message asking that he call Wateen Varon back to arrange a meet time in parking lot for Cohen Doleman to get forms that need to be signed, so that patient does not have to come into building possibly exposing other patients to TB---per renee/health dept, while patient's labs are looking better---he is still testing positive to TB---i am routing to greg calone, fyi.Marland Kitchen.Marland Kitchen..Marland Kitchen

## 2016-03-06 NOTE — Patient Instructions (Signed)
Thank you for choosing [facility].  Summary/Instructions:  Please continue to take your medications as prescribed.   We will send your prescription to [facility].   Continue to work with the Health Department and with gaining weight.   If your symptoms worsen or fail to improve, please contact our office for further instruction, or in case of emergency go directly to the emergency room at the closest medical facility.

## 2016-03-06 NOTE — Progress Notes (Signed)
Pre visit review using our clinic review tool, if applicable. No additional management support is needed unless otherwise documented below in the visit note. 

## 2016-03-06 NOTE — Telephone Encounter (Signed)
Per julie,mgr, ok for patient to be seen today---[facility] has brought forms needed for wheelchair for our office to complete, patient was brought thru back door and roomed in b9--n95 masks were worn by tamara and patient was masked before he entered the office---room will be cleaned with bleach and door closed with no use for one hour

## 2016-03-06 NOTE — Progress Notes (Addendum)
Subjective:    Patient ID: Roberto Rosharles O Sausedo [name], male    DOB: 09-Mar-1941, 75 y.o.   MRN: [id number]  No chief complaint on file.   HPI:  Roberto RosCharles O Timberman [name] is a 75 y.o. male who  has a past medical history of Hypertension; Hypercholesteremia; Arthritis; Depression; Chicken pox; Syphilis; Migraine; and Stroke (HCC). and presents today for a follow up office visit.  Continues to experience the associated symptom of decreased mobility secondary to multiple joints with osteoarthritis including his hands and knees. Currently uses a cane to ambulate short distances but is not able to walk more than 1/2 block without pain or fatigue in his knees and back. Has previously tried a basic wheelchair, however is not able to propel himself secondary to his decreased hand strength and generalized weakness. Notes that he has also tried the electric scooters which he is not able to control adequately secondary to fatigue and pain in his hands. He is able to complete most activities of daily living and requires assistance to prepare food and is currently served through [employer].  No Known Allergies   Current Outpatient Prescriptions on File Prior to Visit  Medication Sig Dispense Refill  . aspirin 81 MG tablet Take 81 mg by mouth daily. Reported on 02/05/2016    . bisacodyl (DULCOLAX) 10 MG suppository Place 10 mg rectally as needed for moderate constipation.    Marland Kitchen. buPROPion (ZYBAN) 150 MG 12 hr tablet Take 1 tablet by mouth daily for 3 days then 1 tablet by mouth twice daily. 60 tablet 0  . ethambutol (MYAMBUTOL) 400 MG tablet Take 400 mg by mouth 2 (two) times daily.    Marland Kitchen. FLUZONE HIGH-DOSE 0.5 ML SUSY Reported on 02/05/2016    . gabapentin (NEURONTIN) 300 MG capsule Take 1 capsule (300 mg total) by mouth 3 (three) times daily. 270 capsule 0  . hydrochlorothiazide (HYDRODIURIL) 12.5 MG tablet Take 1 tablet (12.5 mg total) by mouth daily. 90 tablet 1  . isoniazid (NYDRAZID) 300 MG tablet Take 300  mg by mouth daily.    . meloxicam (MOBIC) 15 MG tablet TAKE ONE TABLET BY MOUTH  DAILY 90 tablet 0  . Multiple Vitamins-Minerals (CENTRUM SILVER ADULT 50+) TABS Take 1 capsule by mouth daily. 100 tablet 5  . nicotine (NICODERM CQ) 21 mg/24hr patch Place 1 patch (21 mg total) onto the skin daily. 28 patch 0  . phenylephrine (SUDAFED PE) 10 MG TABS tablet Take 10 mg by mouth every 4 (four) hours as needed.    . pravastatin (PRAVACHOL) 10 MG tablet Take 1 tablet (10 mg total) by mouth daily. 90 tablet 1  . pyrazinamide 500 MG tablet Take 500 mg by mouth 2 (two) times daily.    . rifampin (RIFADIN) 300 MG capsule Take 300 mg by mouth 2 (two) times daily.    . sertraline (ZOLOFT) 25 MG tablet Take 1 tablet (25 mg total) by mouth daily. 90 tablet 1  . vitamin B-12 (CYANOCOBALAMIN) 100 MCG tablet Take 100 mcg by mouth daily.    . vitamin E 100 UNIT capsule Take by mouth daily.     No current facility-administered medications on file prior to visit.     Past Surgical History  Procedure Laterality Date  . Hernia repair    . Back surgery        Review of Systems  Constitutional: Positive for fatigue. Negative for fever and chills.  Musculoskeletal: Positive for myalgias, back pain and arthralgias. Negative for joint  swelling.  Neurological: Positive for weakness. Negative for dizziness and numbness.      Objective:    BP 132/82 mmHg  Pulse 100  Temp(Src) 98.4 F (36.9 C)  Wt 118 lb (53.524 kg)  SpO2 99% Nursing note and vital signs reviewed.  Physical Exam  Constitutional: He is oriented to person, place, and time. He appears well-developed. He appears cachectic. No distress.  Thin-appearing with stable weight.  Cardiovascular: Normal rate, regular rhythm, normal heart sounds and intact distal pulses.   Pulmonary/Chest: Effort normal and breath sounds normal.  Musculoskeletal:  Upper and lower extremity muscle strength of 4+ with easy of fatigue ability.   Neurological: He is  alert and oriented to person, place, and time.  Skin: Skin is warm and dry.  Psychiatric: He has a normal mood and affect. His behavior is normal. Judgment and thought content normal.       Assessment & Plan:   Problem List Items Addressed This Visit      Other   Decreased mobility - Primary    Continues to experience worsening decreased mobility secondary to arthralgias and osteoarthritis of multiple joints as well as back pain. Mobility has been reduced and is only able to walk approximately one half block before fatigue and pain sitting. Currently ambulate with a cane as able although must rest frequently. A regular wheelchair is insufficient to meet his current need as he does not have the ability to propel the wheelchair forward independently secondary to decreased grip strength and upper body strength and endurance. He has tried to use good reason the past and been unsuccessful and unable to control them secondary to the osteoarthritis in his hands. Based on these findings is recommended for a power wheelchair to help with his mobility maneuverability. He'll continue to work on home strengthening exercises. He does have several comorbidities leading to his current situation will most likely need the wheelchair long-term.          I am having Mr. Swindle maintain his CENTRUM SILVER ADULT 50+, nicotine, aspirin, sertraline, FLUZONE HIGH-DOSE, phenylephrine, vitamin B-12, vitamin E, bisacodyl, buPROPion, gabapentin, hydrochlorothiazide, meloxicam, pravastatin, isoniazid, rifampin, pyrazinamide, and ethambutol.   No orders of the defined types were placed in this encounter.     Follow-up: No Follow-up on file.  Jeanine Luz, FNP

## 2016-03-06 NOTE — Assessment & Plan Note (Signed)
Continues to experience worsening decreased mobility secondary to arthralgias and osteoarthritis of multiple joints as well as back pain. Mobility has been reduced and is only able to walk approximately one half block before fatigue and pain sitting. Currently ambulate with a cane as able although must rest frequently. A regular wheelchair is insufficient to meet his current need as he does not have the ability to propel the wheelchair forward independently secondary to decreased grip strength and upper body strength and endurance. He has tried to use good reason the past and been unsuccessful and unable to control them secondary to the osteoarthritis in his hands. Based on these findings is recommended for a power wheelchair to help with his mobility maneuverability. He'll continue to work on home strengthening exercises. He does have several comorbidities leading to his current situation will most likely need the wheelchair long-term.

## 2016-03-06 NOTE — Telephone Encounter (Signed)
Noted. Patient can reschedule when he is no longer positive/contagious.

## 2016-03-07 ENCOUNTER — Telehealth: Payer: Self-pay

## 2016-03-07 NOTE — Telephone Encounter (Signed)
Faxed over home health forms for wheelchair to [facility].

## 2016-03-18 NOTE — Telephone Encounter (Signed)
[facility] wanted to know if Roberto Wallace could add upper and lower Extremity strengths on a 0-5 scale to the note. Also they needs an MD to [facility] the note. Please follow up thanks.

## 2016-03-18 NOTE — Telephone Encounter (Signed)
Note updated to include necessary medication. Please print and have Dr. Okey Dupre sign. Thanks.

## 2016-03-20 NOTE — Telephone Encounter (Signed)
Documents resent

## 2016-03-25 ENCOUNTER — Telehealth: Payer: Self-pay | Admitting: Emergency Medicine

## 2016-03-25 NOTE — Telephone Encounter (Signed)
Received voicemail for paper work for pt for [facility]. Paperwork needing Gregs and Dr Lawana Chambers signature. Unsure of what she was asking for.   Royann ShiversEunice Blase [phone]  Fax [phone]

## 2016-03-26 DIAGNOSIS — A150 Tuberculosis of lung: Secondary | ICD-10-CM | POA: Diagnosis not present

## 2016-03-27 ENCOUNTER — Ambulatory Visit
Admission: RE | Admit: 2016-03-27 | Discharge: 2016-03-27 | Disposition: A | Discharge status: Home / Self Care | Payer: No Typology Code available for payment source | Source: Ambulatory Visit | Attending: Infectious Disease | Admitting: Infectious Disease

## 2016-03-27 ENCOUNTER — Other Ambulatory Visit: Payer: Self-pay | Admitting: Infectious Disease

## 2016-03-27 DIAGNOSIS — Z09 Encounter for follow-up examination after completed treatment for conditions other than malignant neoplasm: Secondary | ICD-10-CM

## 2016-03-31 NOTE — Telephone Encounter (Signed)
Papers have been resent with Dr. Frutoso Chaserawford's signature.

## 2016-05-23 DIAGNOSIS — R636 Underweight: Secondary | ICD-10-CM | POA: Diagnosis not present

## 2016-05-23 DIAGNOSIS — Z681 Body mass index (BMI) 19 or less, adult: Secondary | ICD-10-CM | POA: Diagnosis not present

## 2016-05-23 DIAGNOSIS — E785 Hyperlipidemia, unspecified: Secondary | ICD-10-CM | POA: Diagnosis not present

## 2016-05-23 DIAGNOSIS — I10 Essential (primary) hypertension: Secondary | ICD-10-CM | POA: Diagnosis not present

## 2016-05-23 DIAGNOSIS — F17200 Nicotine dependence, unspecified, uncomplicated: Secondary | ICD-10-CM | POA: Diagnosis not present

## 2016-05-23 DIAGNOSIS — H547 Unspecified visual loss: Secondary | ICD-10-CM | POA: Diagnosis not present

## 2016-05-23 DIAGNOSIS — Z8611 Personal history of tuberculosis: Secondary | ICD-10-CM | POA: Diagnosis not present

## 2016-05-23 DIAGNOSIS — M159 Polyosteoarthritis, unspecified: Secondary | ICD-10-CM | POA: Diagnosis not present

## 2016-05-23 DIAGNOSIS — M545 Low back pain: Secondary | ICD-10-CM | POA: Diagnosis not present

## 2016-06-09 ENCOUNTER — Ambulatory Visit (INDEPENDENT_AMBULATORY_CARE_PROVIDER_SITE_OTHER): Payer: Medicare PPO | Admitting: Family

## 2016-06-09 ENCOUNTER — Encounter: Payer: Self-pay | Admitting: Family

## 2016-06-09 ENCOUNTER — Ambulatory Visit (INDEPENDENT_AMBULATORY_CARE_PROVIDER_SITE_OTHER)
Admission: RE | Admit: 2016-06-09 | Discharge: 2016-06-09 | Disposition: A | Discharge status: Home / Self Care | Payer: Medicare PPO | Source: Ambulatory Visit | Attending: Family | Admitting: Family

## 2016-06-09 VITALS — BP 112/68 | HR 92 | Temp 98.4°F | Resp 16 | Ht 71.0 in | Wt 117.4 lb

## 2016-06-09 DIAGNOSIS — M25531 Pain in right wrist: Secondary | ICD-10-CM

## 2016-06-09 DIAGNOSIS — M159 Polyosteoarthritis, unspecified: Secondary | ICD-10-CM

## 2016-06-09 DIAGNOSIS — S6991XA Unspecified injury of right wrist, hand and finger(s), initial encounter: Secondary | ICD-10-CM | POA: Diagnosis not present

## 2016-06-09 DIAGNOSIS — G629 Polyneuropathy, unspecified: Secondary | ICD-10-CM | POA: Diagnosis not present

## 2016-06-09 DIAGNOSIS — R2689 Other abnormalities of gait and mobility: Secondary | ICD-10-CM

## 2016-06-09 DIAGNOSIS — Z8679 Personal history of other diseases of the circulatory system: Secondary | ICD-10-CM

## 2016-06-09 NOTE — Progress Notes (Signed)
Subjective:    Patient ID: Roberto Ros., male    DOB: 01-Jan-1941, 75 y.o.   MRN: [id number]  Chief Complaint  Patient presents with  . Follow-up    Mobility exam    HPI:  Roberto Wallace. is a 75 y.o. male who  has a past medical history of Arthritis; Chicken pox; Depression; Hypercholesteremia; Hypertension; Migraine; Stroke (HCC); and Syphilis. and presents today for a mobility exam.  1.) Need for powered mobile device - Roberto Wallace has been seen in our office previously for the associated symptom of decreased mobility secondary to several co-morbid conditions include osteoarthritis of multiple joints, neuropathy, and a history of a CVA which have progressively worsened over time. Currently ambulates using a cane with no recent falls but indicates he stumbles on a frequent basis. He is able to walk approximately half a block at a very slow pace but experiences pain and fatigue in his knees and back and rates the pain approximately 10 out of 10 in severity. Notes best speed that he can safely walk about 50 feet in about 10 minutes. He can walk up and down stairs at a rate of about 13 steps in 5 minutes. At home he is working on obtaining handicap apartment as he has difficulty moving between rooms in between appliances when attempting to make foods. He does receive assistance services for food preparation through the local [employer]. Indicates a power mobility device would be able to assess his movement in the home and provide him the opportunity to perform his activities of daily living including cooking and became more mobile around the home without the risk of falls. He is motivated to obtain this chair as able provide increased mobility and independence in his home.  2.) Wrist pain - This is a new problem. Associated symptom of pain located in his right wrist has been going on for about 2 days following a near fall and landing on his right wrist. Denies sounds/sensation  heard or felt. No treatments or modifying factors noted. He is right hand dominant.   No Known Allergies    Outpatient Medications Prior to Visit  Medication Sig Dispense Refill  . aspirin 81 MG tablet Take 81 mg by mouth daily. Reported on 02/05/2016    . bisacodyl (DULCOLAX) 10 MG suppository Place 10 mg rectally as needed for moderate constipation.    Marland Kitchen buPROPion (ZYBAN) 150 MG 12 hr tablet Take 1 tablet by mouth daily for 3 days then 1 tablet by mouth twice daily. 60 tablet 0  . ethambutol (MYAMBUTOL) 400 MG tablet Take 400 mg by mouth 2 (two) times daily.    Marland Kitchen FLUZONE HIGH-DOSE 0.5 ML SUSY Reported on 02/05/2016    . gabapentin (NEURONTIN) 300 MG capsule Take 1 capsule (300 mg total) by mouth 3 (three) times daily. 270 capsule 0  . hydrochlorothiazide (HYDRODIURIL) 12.5 MG tablet Take 1 tablet (12.5 mg total) by mouth daily. 90 tablet 1  . isoniazid (NYDRAZID) 300 MG tablet Take 300 mg by mouth daily.    . meloxicam (MOBIC) 15 MG tablet TAKE ONE TABLET BY MOUTH  DAILY 90 tablet 0  . Multiple Vitamins-Minerals (CENTRUM SILVER ADULT 50+) TABS Take 1 capsule by mouth daily. 100 tablet 5  . nicotine (NICODERM CQ) 21 mg/24hr patch Place 1 patch (21 mg total) onto the skin daily. 28 patch 0  . phenylephrine (SUDAFED PE) 10 MG TABS tablet Take 10 mg by mouth every 4 (four) hours  as needed.    . pravastatin (PRAVACHOL) 10 MG tablet Take 1 tablet (10 mg total) by mouth daily. 90 tablet 1  . pyrazinamide 500 MG tablet Take 500 mg by mouth 2 (two) times daily.    . rifampin (RIFADIN) 300 MG capsule Take 300 mg by mouth 2 (two) times daily.    . sertraline (ZOLOFT) 25 MG tablet Take 1 tablet (25 mg total) by mouth daily. 90 tablet 1  . vitamin B-12 (CYANOCOBALAMIN) 100 MCG tablet Take 100 mcg by mouth daily.    . vitamin E 100 UNIT capsule Take by mouth daily.     No facility-administered medications prior to visit.       Past Surgical History:  Procedure Laterality Date  . BACK SURGERY      . HERNIA REPAIR        Past Medical History:  Diagnosis Date  . Arthritis   . Chicken pox   . Depression   . Hypercholesteremia   . Hypertension   . Migraine   . Stroke (HCC)    No residual effects  . Syphilis       Review of Systems  Constitutional: Negative for chills and fever.  Eyes: Negative for visual disturbance.  Respiratory: Negative for chest tightness and shortness of breath.   Cardiovascular: Negative for chest pain, palpitations and leg swelling.  Neurological: Positive for weakness and numbness.      Objective:    BP 112/68 (BP Location: Left Arm, Patient Position: Sitting, Cuff Size: Normal)   Pulse 92   Temp 98.4 F (36.9 C) (Oral)   Resp 16   Ht 5\' 11"  (1.803 m)   Wt 117 lb 6.4 oz (53.3 kg)   SpO2 96%   BMI 16.37 kg/m  Nursing note and vital signs reviewed.  Physical Exam  Constitutional: He is oriented to person, place, and time. He appears well-developed and well-nourished. No distress.  Cardiovascular: Normal rate, regular rhythm, normal heart sounds and intact distal pulses.  Exam reveals no gallop and no friction rub.   No murmur heard. Pulmonary/Chest: Effort normal and breath sounds normal. No respiratory distress. He has no wheezes. He has no rales. He exhibits no tenderness.  Musculoskeletal:  Upper extremity strength seated is 4+ and easily fatigued with repetitions greater than 10 and most motions. Lower extremity strength seated is 4+ and easily fatigued greater than 10 repetitions. Gait observed is unsteady and balance is maintained with cane although patient appears high risk for falls. He is unable to walk a few feet without wobbling. Grip strength is decreased bilaterally secondary to pain in hands.  Right wrist - no obvious deformity, discoloration, or edema. Mild tenderness elicited over carpal bones and Lister's tubercle. Range of motion is within normal limits with decreased grip strength secondary to arthritis. Pulses and  sensation are intact.   Neurological: He is alert and oriented to person, place, and time.  Skin: Skin is warm and dry.  Psychiatric: He has a normal mood and affect. His behavior is normal. Judgment and thought content normal.       Assessment & Plan:   Problem List Items Addressed This Visit      Nervous and Auditory   Neuropathy (HCC)     Musculoskeletal and Integument   GEN OSTEOARTHROSIS INVOLVING MULTIPLE SITES     Other   History of cardiovascular disorder   Decreased mobility - Primary    Patient's mobility is affected by comorbid conditions including osteoarthritis of multiple sites,  neuropathy, decreased mobility, and history of cardiovascular disorders. Patient currently uses a cane and with poor balance is increased risk for falls as he has an unsteady gait. He is unable to use a manual wheelchair due to pain level of 10 out of 10 in his hands secondary to osteoarthritis as well as decreased upper extremity endurance. He cannot use a PLV secondary to lack of postural stability and decreased strength and hand. The powered mobile device is necessary to get to the kitchen to prepare meals/cook/eat as well as have increased ability to complete activities of daily living. He can safely operate a power mobility device both mentally and physically and is willing and motivated to use it in the home. Based on assessment finding it is recommended a power mobility device to help with activities of daily living within the home.      Right wrist pain    Right wrist pain appears consistent with a sprain although there is concern for fracture. Obtain x-ray. Treat conservatively with compression wrap, ice and anti-inflammatories as needed. Follow up if symptoms worsen or do not improve.       Relevant Orders   DG Wrist Complete Right    Other Visit Diagnoses   None.      I am having Roberto Wallace maintain his CENTRUM SILVER ADULT 50+, nicotine, aspirin, sertraline, FLUZONE HIGH-DOSE,  phenylephrine, vitamin B-12, vitamin E, bisacodyl, buPROPion, gabapentin, hydrochlorothiazide, meloxicam, pravastatin, isoniazid, rifampin, pyrazinamide, and ethambutol.  Follow-up: Return in about 2 months (around 08/09/2016), or if symptoms worsen or fail to improve.  Jeanine Luz, FNP

## 2016-06-09 NOTE — Assessment & Plan Note (Signed)
Right wrist pain appears consistent with a sprain although there is concern for fracture. Obtain x-ray. Treat conservatively with compression wrap, ice and anti-inflammatories as needed. Follow up if symptoms worsen or do not improve.

## 2016-06-09 NOTE — Assessment & Plan Note (Addendum)
Patient's mobility is affected by comorbid conditions including osteoarthritis of multiple sites, neuropathy, decreased mobility, and history of cardiovascular disorders. Patient currently uses a cane and with poor balance is increased risk for falls as he has an unsteady gait. He is unable to use a manual wheelchair due to pain level of 10 out of 10 in his hands secondary to osteoarthritis as well as decreased upper extremity endurance. He cannot use a PLV secondary to lack of postural stability and decreased strength and hand. The powered mobile device is necessary to get to the kitchen to prepare meals/cook/eat as well as have increased ability to complete activities of daily living. He can safely operate a power mobility device both mentally and physically and is willing and motivated to use it in the home. Based on assessment finding it is recommended a power mobility device to help with activities of daily living within the home.

## 2016-06-09 NOTE — Patient Instructions (Addendum)
Thank you for choosing [facility].  SUMMARY AND INSTRUCTIONS:  Please ice x 20 minutes every 2 hours as needed.  OTC Tylenol as needed for discomfort.   ACE wrap for compression.   Follow up:  If your symptoms worsen or fail to improve, please contact our office for further instruction, or in case of emergency go directly to the emergency room at the closest medical facility.

## 2016-07-09 ENCOUNTER — Ambulatory Visit
Admission: RE | Admit: 2016-07-09 | Discharge: 2016-07-09 | Disposition: A | Discharge status: Home / Self Care | Payer: No Typology Code available for payment source | Source: Ambulatory Visit | Attending: Infectious Disease | Admitting: Infectious Disease

## 2016-07-09 ENCOUNTER — Other Ambulatory Visit: Payer: Self-pay | Admitting: Infectious Disease

## 2016-07-09 DIAGNOSIS — A159 Respiratory tuberculosis unspecified: Secondary | ICD-10-CM

## 2016-07-14 DIAGNOSIS — G629 Polyneuropathy, unspecified: Secondary | ICD-10-CM | POA: Diagnosis not present

## 2016-07-14 DIAGNOSIS — M159 Polyosteoarthritis, unspecified: Secondary | ICD-10-CM | POA: Diagnosis not present

## 2016-07-14 DIAGNOSIS — I6350 Cerebral infarction due to unspecified occlusion or stenosis of unspecified cerebral artery: Secondary | ICD-10-CM | POA: Diagnosis not present

## 2016-07-29 ENCOUNTER — Other Ambulatory Visit: Payer: Self-pay

## 2016-07-29 DIAGNOSIS — G629 Polyneuropathy, unspecified: Secondary | ICD-10-CM

## 2016-07-29 DIAGNOSIS — I10 Essential (primary) hypertension: Secondary | ICD-10-CM

## 2016-07-29 MED ORDER — SERTRALINE HCL 25 MG PO TABS: 25.0000 mg | ORAL_TABLET | Freq: Every day | ORAL | 1 refills | Status: DC

## 2016-07-29 MED ORDER — HYDROCHLOROTHIAZIDE 12.5 MG PO TABS: 12.5000 mg | ORAL_TABLET | Freq: Every day | ORAL | 1 refills | Status: DC

## 2016-07-29 MED ORDER — GABAPENTIN 300 MG PO CAPS: 300.0000 mg | ORAL_CAPSULE | Freq: Three times a day (TID) | ORAL | 0 refills | Status: DC

## 2016-07-29 MED ORDER — MELOXICAM 15 MG PO TABS: ORAL_TABLET | ORAL | 0 refills | Status: DC

## 2016-07-29 MED ORDER — PRAVASTATIN SODIUM 10 MG PO TABS: 10.0000 mg | ORAL_TABLET | Freq: Every day | ORAL | 1 refills | Status: DC

## 2016-07-31 ENCOUNTER — Encounter: Payer: Self-pay | Admitting: Neurology

## 2016-07-31 ENCOUNTER — Ambulatory Visit (INDEPENDENT_AMBULATORY_CARE_PROVIDER_SITE_OTHER): Payer: Medicare PPO | Admitting: Neurology

## 2016-07-31 VITALS — BP 130/76 | HR 84 | Ht 71.0 in | Wt 117.0 lb

## 2016-07-31 DIAGNOSIS — F419 Anxiety disorder, unspecified: Secondary | ICD-10-CM

## 2016-07-31 DIAGNOSIS — F418 Other specified anxiety disorders: Secondary | ICD-10-CM | POA: Diagnosis not present

## 2016-07-31 DIAGNOSIS — I679 Cerebrovascular disease, unspecified: Secondary | ICD-10-CM

## 2016-07-31 DIAGNOSIS — F4310 Post-traumatic stress disorder, unspecified: Secondary | ICD-10-CM | POA: Diagnosis not present

## 2016-07-31 DIAGNOSIS — F17200 Nicotine dependence, unspecified, uncomplicated: Secondary | ICD-10-CM

## 2016-07-31 DIAGNOSIS — F32A Depression, unspecified: Secondary | ICD-10-CM

## 2016-07-31 DIAGNOSIS — F329 Major depressive disorder, single episode, unspecified: Secondary | ICD-10-CM

## 2016-07-31 DIAGNOSIS — G3184 Mild cognitive impairment, so stated: Secondary | ICD-10-CM | POA: Diagnosis not present

## 2016-07-31 MED ORDER — DONEPEZIL HCL 5 MG PO TABS: 5.0000 mg | ORAL_TABLET | Freq: Every day | ORAL | 0 refills | Status: DC

## 2016-07-31 NOTE — Progress Notes (Addendum)
NEUROLOGY FOLLOW UP OFFICE NOTE  Roberto RosCharles O Burek [name] [id number]  HISTORY OF PRESENT ILLNESS: Roberto RosCharles O. Goates Sr is a 75 year old right-handed male with hypertension, hypercholesterolemia, depression, lumbar disc disease, and history of syphilis and stroke who follows up for mild cognitive impairement.  UPDATE: B12 was 531.  Folate greater than 23.6.  TSH 0.39.  RPR nonreactive.  HIV negative.  Since last visit, he has been treated for TB.  He continues to lose weight. He feels his memory is getting worse.  He often leaves the key to his mailbox in the mailbox.  However, he is still able to manage his finances (with the help of his phone and automatic paying).  He does not drive beyond 10 miles from home and if he needs to, he uses GPS.  He has not gotten lost or has been involved in accidents or near-accidents.  He only uses the microwave at home, not the stove.  He has an aid who comes to the home periodically to assess safety.   HISTORY: He began noticing memory problems since 1994, following hernia repair.  He was bedbound for several weeks.  It caused stress and frustration.  He then reports that his belongings were accidentally removed from his apartment.  He had boxes in the hallway that were to be donated to the [employer] [facility].  When he was away, the people who came to pick up the boxes reportedly went inside his place and took his other belongings as well.  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  Due to his arthritis, he was unable to climb up and down the bus.  These events have contributed to a great deal of stress and depression, which has been a chronic issue.   Specifically, he reports short-term memory problems.  He lives by himself.  On several occasions, he left the stove on.  He rarely uses the stove and has meals on wheels.  He has a CNA come to the house to help clean.  He has a strict budget which helps him pay his  bills without difficulty.  He usually only drives one block down to the grocery store.  If he drives farther locally, he uses GPS.  Otherwise, he feels he would get lost.  He has one older sister who lives nearby.  He has a friend who comes by the house once a week to check on him.  He denies trouble with familiar names or faces.  He is able to perform ADLs.     He had some college.  He reports that his mother had dementia. 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 He reports history of 3 TIAs.  PAST MEDICAL HISTORY: Past Medical History:  Diagnosis Date  . Arthritis   . Chicken pox   . Depression   . Hypercholesteremia   . Hypertension   . Migraine   . Stroke (HCC)    No residual effects  . Syphilis     MEDICATIONS: Current Outpatient Prescriptions on File Prior to Visit  Medication Sig Dispense Refill  . aspirin 81 MG tablet Take 81 mg by mouth daily. Reported on 02/05/2016    . bisacodyl (DULCOLAX) 10 MG suppository Place 10 mg rectally as needed for moderate constipation.    Marland Kitchen. buPROPion (ZYBAN) 150 MG 12 hr tablet Take 1 tablet by mouth daily for  3 days then 1 tablet by mouth twice daily. 60 tablet 0  . ethambutol (MYAMBUTOL) 400 MG tablet Take 400 mg by mouth 2 (two) times daily.    Marland Kitchen FLUZONE HIGH-DOSE 0.5 ML SUSY Reported on 02/05/2016    . gabapentin (NEURONTIN) 300 MG capsule Take 1 capsule (300 mg total) by mouth 3 (three) times daily. 270 capsule 0  . hydrochlorothiazide (HYDRODIURIL) 12.5 MG tablet Take 1 tablet (12.5 mg total) by mouth daily. 90 tablet 1  . isoniazid (NYDRAZID) 300 MG tablet Take 300 mg by mouth daily.    . meloxicam (MOBIC) 15 MG tablet TAKE ONE TABLET BY MOUTH  DAILY 90 tablet 0  . Multiple Vitamins-Minerals (CENTRUM SILVER ADULT 50+) TABS Take 1 capsule by mouth daily. 100 tablet 5  . nicotine (NICODERM CQ) 21 mg/24hr patch Place 1 patch (21 mg total) onto the skin daily. 28 patch 0  . phenylephrine  (SUDAFED PE) 10 MG TABS tablet Take 10 mg by mouth every 4 (four) hours as needed.    . pravastatin (PRAVACHOL) 10 MG tablet Take 1 tablet (10 mg total) by mouth daily. 90 tablet 1  . pyrazinamide 500 MG tablet Take 500 mg by mouth 2 (two) times daily.    . rifampin (RIFADIN) 300 MG capsule Take 300 mg by mouth 2 (two) times daily.    . sertraline (ZOLOFT) 25 MG tablet Take 1 tablet (25 mg total) by mouth daily. 90 tablet 1  . vitamin B-12 (CYANOCOBALAMIN) 100 MCG tablet Take 100 mcg by mouth daily.    . vitamin E 100 UNIT capsule Take by mouth daily.     No current facility-administered medications on file prior to visit.     ALLERGIES: No Known Allergies  FAMILY HISTORY: Family History  Problem Relation Age of Onset  . Healthy Mother   . Healthy Father     SOCIAL HISTORY: Social History   Social History  . Marital status: Widowed    Spouse name: N/A  . Number of children: 2  . Years of education: 14   Occupational History  . Retired    Social History Main Topics  . Smoking status: Current Every Day Smoker    Packs/day: 0.33    Years: 50.00    Types: Cigarettes  . Smokeless tobacco: Never Used  . Alcohol use 1.2 - 1.8 oz/week    2 - 3 Shots of liquor per week     Comment: social   . Drug use: No  . Sexual activity: No   Other Topics Concern  . Not on file   Social History Narrative   Fun: Sit on the back porch and relax.   Denies religious beliefs effecting health care.    Lives alone in a one story home.  Has 2 biological children and 3 adopted children.   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       REVIEW OF SYSTEMS: Constitutional: No fevers, chills, or sweats, no generalized fatigue, change in appetite Eyes: No visual changes, double vision, eye pain Ear, nose and throat: No hearing loss, ear pain, nasal congestion, sore throat Cardiovascular: No chest pain, palpitations Respiratory:  No shortness of breath at rest or with exertion,  wheezes GastrointestinaI: No nausea, vomiting, diarrhea, abdominal pain, fecal incontinence Genitourinary:  No dysuria, urinary retention or frequency Musculoskeletal:  Knee pain Integumentary: No rash, pruritus, skin lesions Neurological: as above Psychiatric: depression, , anxiety Endocrine: No palpitations, fatigue, diaphoresis, mood swings, change in appetite, change in  weight, increased thirst Hematologic/Lymphatic:  No purpura, petechiae. Allergic/Immunologic: no itchy/runny eyes, nasal congestion, recent allergic reactions, rashes  PHYSICAL EXAM: Vitals:   07/31/16 0841  BP: 130/76  Pulse: 84   General: No acute distress.  Patient is thin Head:  Normocephalic/atraumatic Eyes:  Fundi examined but not visualized Neck: supple, no paraspinal tenderness, full range of motion Heart:  Regular rate and rhythm Lungs:  Clear to auscultation bilaterally Back: No paraspinal tenderness Neurological Exam: alert and oriented to person, place, and time. Attention span and concentration impaired (but did not put in effort), recent memory difficult to assess (did not put in effort), remote memory intact, fund of knowledge intact.  Speech fluent and not dysarthric, language intact.   Montreal Cognitive Assessment  07/31/2016 10/30/2015 10/30/2015  Visuospatial/ Executive (0/5) 5 - 5  Naming (0/3) 2 - 3  Attention: Read list of digits (0/2) 1 - 2  Attention: Read list of letters (0/1) 1 - 1  Attention: Serial 7 subtraction starting at 100 (0/3) 0 2 1  Language: Repeat phrase (0/2) 2 - 2  Language : Fluency (0/1) 1 - 0  Abstraction (0/2) 2 - 2  Delayed Recall (0/5) 0 - 1  Orientation (0/6) 6 - 6  Total 20 - 23  Adjusted Score (based on education) 20 - -  He did not attempt serial 7 subtraction or delayed recall. CN II-XII intact. Bulk and tone normal, muscle strength 5/5 throughout.  Sensation to light touch  intact.  Deep tendon reflexes absent throughout.  Finger to nose testing intact.  In  wheelchair.  IMPRESSION: Mild cognitive impairment.  I suspect his significant anxiety and depression is playing a significant role in his memory problems.  However, there may be a vascular component as well.  I recommended neuropsychological testing to try and flesh this out, but he thinks he would not likely be able to participate due to his anxiety.  Tobacco use Cerebrovascular disease  PLAN: 1.  Secondary stroke prevention:  ASA 81mg , statin therapy, blood pressure control 2.  Smoking cessation 3.  Treatment of depression and anxiety 4.  Since we cannot perform further cognitive testing, we will start Aricept and see how he does on it. 5.  Follow up in one year.  28 minutes spent face to face with patient, over 50% spent discussing differential diagnoses and potential etiologies of memory problems, discussing further options for testing and medication options.  Shon MilletAdam Jaffe, DO  CC:  Jeanine LuzGregory Calone, FNP

## 2016-07-31 NOTE — Patient Instructions (Signed)
1.  We will start donepezil (Aricept) 5mg  daily for four weeks.  If you are tolerating the medication, then after four weeks, we will increase the dose to 10mg  daily.  Side effects include nausea, vomiting, diarrhea, vivid dreams, and muscle cramps.  Please call the clinic if you experience any of these symptoms. 2.  Try to work on quitting smoking 3.  Follow up in one year.

## 2016-08-13 DIAGNOSIS — G629 Polyneuropathy, unspecified: Secondary | ICD-10-CM | POA: Diagnosis not present

## 2016-08-13 DIAGNOSIS — M159 Polyosteoarthritis, unspecified: Secondary | ICD-10-CM | POA: Diagnosis not present

## 2016-08-13 DIAGNOSIS — I6350 Cerebral infarction due to unspecified occlusion or stenosis of unspecified cerebral artery: Secondary | ICD-10-CM | POA: Diagnosis not present

## 2016-09-13 DIAGNOSIS — G629 Polyneuropathy, unspecified: Secondary | ICD-10-CM | POA: Diagnosis not present

## 2016-09-13 DIAGNOSIS — M159 Polyosteoarthritis, unspecified: Secondary | ICD-10-CM | POA: Diagnosis not present

## 2016-09-13 DIAGNOSIS — I6350 Cerebral infarction due to unspecified occlusion or stenosis of unspecified cerebral artery: Secondary | ICD-10-CM | POA: Diagnosis not present

## 2016-09-17 ENCOUNTER — Telehealth: Payer: Self-pay | Admitting: Neurology

## 2016-09-17 NOTE — Telephone Encounter (Signed)
Roberto Wallace 06-09-2041. He needs a refill on Donepezil. He said it is helping but needs to be increased. He uses [facility]. His # [phone]. Thank you

## 2016-09-17 NOTE — Telephone Encounter (Signed)
Increase Aricept to 10mg at bedtime 

## 2016-09-17 NOTE — Telephone Encounter (Signed)
Spoke to patient.  Says he was told to call if he felt he needed an increase. Advised would send to Dr. Everlena CooperJaffe to advise.

## 2016-09-19 MED ORDER — DONEPEZIL HCL 10 MG PO TABS: 10.0000 mg | ORAL_TABLET | Freq: Every day | ORAL | 1 refills | Status: DC

## 2016-09-19 NOTE — Telephone Encounter (Signed)
Spoke to son advised sent Rx for Aricept 10mg  to [facility].  Son verbalized understanding.

## 2016-10-14 DIAGNOSIS — I6350 Cerebral infarction due to unspecified occlusion or stenosis of unspecified cerebral artery: Secondary | ICD-10-CM | POA: Diagnosis not present

## 2016-10-14 DIAGNOSIS — G629 Polyneuropathy, unspecified: Secondary | ICD-10-CM | POA: Diagnosis not present

## 2016-10-14 DIAGNOSIS — M159 Polyosteoarthritis, unspecified: Secondary | ICD-10-CM | POA: Diagnosis not present

## 2016-11-11 DIAGNOSIS — M159 Polyosteoarthritis, unspecified: Secondary | ICD-10-CM | POA: Diagnosis not present

## 2016-11-11 DIAGNOSIS — G629 Polyneuropathy, unspecified: Secondary | ICD-10-CM | POA: Diagnosis not present

## 2016-11-11 DIAGNOSIS — I6350 Cerebral infarction due to unspecified occlusion or stenosis of unspecified cerebral artery: Secondary | ICD-10-CM | POA: Diagnosis not present

## 2016-11-20 ENCOUNTER — Encounter (HOSPITAL_COMMUNITY): Payer: Self-pay | Admitting: Emergency Medicine

## 2016-11-20 ENCOUNTER — Emergency Department (HOSPITAL_COMMUNITY)
Admission: EM | Admit: 2016-11-20 | Discharge: 2016-11-20 | Disposition: A | Discharge status: Home / Self Care | Payer: Medicare PPO | Attending: Emergency Medicine | Admitting: Emergency Medicine

## 2016-11-20 ENCOUNTER — Emergency Department (HOSPITAL_COMMUNITY): Payer: Medicare PPO

## 2016-11-20 DIAGNOSIS — R404 Transient alteration of awareness: Secondary | ICD-10-CM | POA: Diagnosis not present

## 2016-11-20 DIAGNOSIS — F17210 Nicotine dependence, cigarettes, uncomplicated: Secondary | ICD-10-CM | POA: Diagnosis not present

## 2016-11-20 DIAGNOSIS — R55 Syncope and collapse: Secondary | ICD-10-CM

## 2016-11-20 DIAGNOSIS — I10 Essential (primary) hypertension: Secondary | ICD-10-CM | POA: Insufficient documentation

## 2016-11-20 DIAGNOSIS — Z79899 Other long term (current) drug therapy: Secondary | ICD-10-CM | POA: Diagnosis not present

## 2016-11-20 DIAGNOSIS — Z8673 Personal history of transient ischemic attack (TIA), and cerebral infarction without residual deficits: Secondary | ICD-10-CM | POA: Insufficient documentation

## 2016-11-20 DIAGNOSIS — R51 Headache: Secondary | ICD-10-CM | POA: Diagnosis not present

## 2016-11-20 LAB — CBC WITH DIFFERENTIAL/PLATELET
BASOS ABS: 0 10*3/uL (ref 0.0–0.1)
Basophils Relative: 0 %
Eosinophils Absolute: 0.2 10*3/uL (ref 0.0–0.7)
Eosinophils Relative: 3 %
HCT: 45.8 % (ref 39.0–52.0)
HEMOGLOBIN: 15 g/dL (ref 13.0–17.0)
LYMPHS ABS: 1.8 10*3/uL (ref 0.7–4.0)
LYMPHS PCT: 26 %
MCH: 29.9 pg (ref 26.0–34.0)
MCHC: 32.8 g/dL (ref 30.0–36.0)
MCV: 91.4 fL (ref 78.0–100.0)
Monocytes Absolute: 0.5 10*3/uL (ref 0.1–1.0)
Monocytes Relative: 7 %
NEUTROS PCT: 64 %
Neutro Abs: 4.4 10*3/uL (ref 1.7–7.7)
Platelets: 206 10*3/uL (ref 150–400)
RBC: 5.01 MIL/uL (ref 4.22–5.81)
RDW: 13.3 % (ref 11.5–15.5)
WBC: 6.9 10*3/uL (ref 4.0–10.5)

## 2016-11-20 LAB — BASIC METABOLIC PANEL
ANION GAP: 8 (ref 5–15)
BUN: 9 mg/dL (ref 6–20)
CO2: 30 mmol/L (ref 22–32)
Calcium: 9.7 mg/dL (ref 8.9–10.3)
Chloride: 102 mmol/L (ref 101–111)
Creatinine, Ser: 0.85 mg/dL (ref 0.61–1.24)
GFR calc Af Amer: >60 mL/min (ref >60)
GFR calc non Af Amer: >60 mL/min (ref >60)
GLUCOSE: 98 mg/dL (ref 65–99)
POTASSIUM: 4.2 mmol/L (ref 3.5–5.1)
SODIUM: 140 mmol/L (ref 135–145)

## 2016-11-20 LAB — I-STAT TROPONIN, ED: TROPONIN I, POC: 0 ng/mL (ref 0.00–0.08)

## 2016-11-20 MED ORDER — GABAPENTIN 300 MG PO CAPS
300.0000 mg | ORAL_CAPSULE | Freq: Once | ORAL | Status: AC
  Administered 2016-11-20: 300 mg via ORAL
  Filled 2016-11-20: qty 1

## 2016-11-20 NOTE — ED Triage Notes (Signed)
Per EMS, pt was walking with neighbor when he suddenly became dizzy and had a seat on a bench when he experienced a syncopal episode lasting several seconds. The pt did not fall because the neighbor was holding him. When the pt became alert he vomited and stated he felt much better. Pt reports having episodes like this in the past and it being related to blood pressure. EMS obtained orthostatic VS and reported no changes. Pt ambulates with a cane. Pt is AO x4.

## 2016-11-20 NOTE — ED Provider Notes (Signed)
[facility]-EMERGENCY DEPT Provider Note   CSN: [id number] Arrival date & time: 11/20/16  1507     History   Chief Complaint Chief Complaint  Patient presents with  . Loss of Consciousness    HPI Roberto RosCharles O Cutrone [name] is a 76 y.o. male.  HPI 76 year old male who presents with syncopal episode. He has a history of migraine headaches, prior CVA, hypertension. States he had a syncopal episode today. States that he has had a history of syncope, 3 episodes over the past year or 2 including today. States that he has been evaluated for in abnormal heart rate before in the past at the [facility], and was under evaluation for pacemaker. He states that he drank cactus juice, and it stopped his issue and he no longer needed a pacemaker.  He states that he has been in his usual state of health. States that he has been well-hydrated and had a normal breakfast this morning. States that he walked 15 feet outside to talk to somebody on a bench. He said that he felt normal and sat down to talk to his friend but had a syncopal episode afterwards. I he initially told EMS that he felt dizzy, but he then states that he did not feel like he was lightheaded or feel like he was about to pass out. States that he probably was out for several seconds, when he awoke had an episode of vomiting. Denies any associating chest pain, difficulty breathing, palpitations, confusion, focal numbness or weakness, vision or speech changes, lower extremity edema, dyspnea on exertion, or fatigue. Orthostatics by EMS were normal.   Past Medical History:  Diagnosis Date  . Arthritis   . Chicken pox   . Depression   . Hypercholesteremia   . Hypertension   . Migraine   . Stroke (HCC)    No residual effects  . Syphilis     Patient Active Problem List   Diagnosis Date Noted  . Right wrist pain 06/09/2016  . Osteoarthritis 02/05/2016  . Tuberculosis 02/05/2016  . Loss of weight 10/31/2015  . 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 Neuropathy (HCC) 10/03/2015  . Intermittent memory loss 10/03/2015  . Decreased mobility 10/03/2015  . Routine general medical examination at a health care facility 04/16/2015  . Medicare annual wellness visit, subsequent 04/16/2015  . TOBACCO USER 06/14/2010  . Depression 11/19/2009  . HYPERTENSION, BENIGN ESSENTIAL 11/19/2009  . OTHER SPECIFIED CARDIAC DYSRHYTHMIAS 11/19/2009  . GEN OSTEOARTHROSIS INVOLVING MULTIPLE SITES 11/19/2009  . History of cardiovascular disorder 11/19/2009    Past Surgical History:  Procedure Laterality Date  . BACK SURGERY    . HERNIA REPAIR         Home Medications    Prior to Admission medications   Medication Sig Start Date End Date Taking? Authorizing Provider  buPROPion (ZYBAN) 150 MG 12 hr tablet Take 1 tablet by mouth daily for 3 days then 1 tablet by mouth twice daily. Patient taking differently: Take 150 mg by mouth 2 (two) times daily.  02/05/16  Yes Veryl SpeakGregory D Calone, FNP  donepezil (ARICEPT) 10 MG tablet Take 1 tablet (10 mg total) by mouth at bedtime. 09/19/16  Yes Adam Mliss Fritz Jaffe, DO  gabapentin (NEURONTIN) 300 MG capsule Take 1 capsule (300 mg total) by mouth 3 (three) times daily. 07/29/16  Yes Veryl SpeakGregory D Calone, FNP  hydrochlorothiazide (HYDRODIURIL) 12.5 MG tablet Take 1 tablet (12.5 mg total) by mouth daily. 07/29/16  Yes Veryl SpeakGregory D Calone, FNP  meloxicam (MOBIC) 15 MG tablet TAKE ONE TABLET BY MOUTH  DAILY 07/29/16  Yes Veryl Speak, FNP  Multiple Vitamins-Minerals (CENTRUM SILVER ADULT 50+) TABS Take 1 capsule by mouth daily. 03/27/15  Yes Veryl Speak, FNP  pravastatin (PRAVACHOL) 10 MG tablet Take 1 tablet (10 mg total) by mouth daily. 07/29/16  Yes Veryl Speak, FNP  sertraline (ZOLOFT) 25 MG tablet Take 1 tablet (25 mg total) by mouth daily. 07/29/16  Yes Veryl Speak, FNP  vitamin B-12 (CYANOCOBALAMIN) 100 MCG tablet Take 100 mcg by mouth daily.   Yes Historical Provider, MD  bisacodyl (DULCOLAX) 10  MG suppository Place 10 mg rectally as needed for moderate constipation.    Historical Provider, MD  nicotine (NICODERM CQ) 21 mg/24hr patch Place 1 patch (21 mg total) onto the skin daily. Patient not taking: Reported on 11/20/2016 03/27/15   Veryl Speak, FNP  phenylephrine (SUDAFED PE) 10 MG TABS tablet Take 10 mg by mouth every 4 (four) hours as needed (pain).     Historical Provider, MD    Family History Family History  Problem Relation Age of Onset  . Healthy Mother   . Healthy Father     Social History Social History  Substance Use Topics  . Smoking status: Current Every Day Smoker    Packs/day: 0.33    Years: 50.00    Types: Cigarettes  . Smokeless tobacco: Never Used  . Alcohol use 1.2 - 1.8 oz/week    2 - 3 Shots of liquor per week     Comment: social      Allergies   Patient has no known allergies.   Review of Systems Review of Systems 10/14 systems reviewed and are negative other than those stated in the HPI  Physical Exam Updated Vital Signs BP 113/81 (BP Location: Right Arm)   Pulse 77   Temp 97.9 F (36.6 C) (Oral)   Resp 18   SpO2 100%   Physical Exam Physical Exam  Nursing note and vitals reviewed. Constitutional: Well developed, well nourished, non-toxic, and in no acute distress Head: Normocephalic and atraumatic.  Mouth/Throat: Oropharynx is clear and moist.  Neck: Normal range of motion. Neck supple.  Cardiovascular: Normal rate and regular rhythm.   Pulmonary/Chest: Effort normal and breath sounds normal.  Abdominal: Soft. There is no tenderness. There is no rebound and no guarding.  Musculoskeletal: Normal range of motion.  Neurological: Alert, no facial droop, fluent speech, moves all extremities symmetrically, sensation to light touch in tact throughout, bilateral equal handgrip and full strength dorsi/plantarflexon of ankles Skin: Skin is warm and dry.  Psychiatric: Cooperative   ED Treatments / Results  Labs (all labs ordered  are listed, but only abnormal results are displayed) Labs Reviewed  CBC WITH DIFFERENTIAL/PLATELET  BASIC METABOLIC PANEL  I-STAT TROPOININ, ED    EKG  EKG Interpretation  Date/Time:  Thursday November 20 2016 15:16:37 EDT Ventricular Rate:  72 PR Interval:    QRS Duration: 103 QT Interval:  390 QTC Calculation: 427 R Axis:   73 Text Interpretation:  Sinus rhythm Left atrial enlargement No STEMI. Similar to prior.  Confirmed by LONG MD, JOSHUA [phone]) on 11/20/2016 3:26:09 PM       Radiology Ct Head Wo Contrast  Result Date: 11/20/2016 CLINICAL DATA:  Sudden onset of dizziness and syncope. Daily headaches. EXAM: CT HEAD WITHOUT CONTRAST TECHNIQUE: Contiguous axial images were obtained from the base of the skull through the vertex without intravenous contrast. COMPARISON:  MRI brain 01/09/2015. FINDINGS: Brain: Moderate generalized atrophy and white matter disease is again seen. This is within normal limits for age. No acute infarct, hemorrhage, or mass lesion is present. The brainstem and cerebellum are normal. The ventricles are proportionate to the degree of atrophy. No significant extra-axial fluid collection is present. Vascular: Atherosclerotic calcifications are present within the cavernous internal carotid arteries bilaterally. There is no hyperdense vessel. Skull: The calvarium is intact. No focal lytic or blastic lesions are present. Sinuses/Orbits: The paranasal sinuses and mastoid air cells are clear. Globes and orbits are within normal limits. IMPRESSION: 1. Normal CT of the brain for age. 2. No acute or focal abnormality to explain headaches for syncope. Electronically Signed   By: Marin Roberts M.D.   On: 11/20/2016 16:44    Procedures Procedures (including critical care time)  Medications Ordered in ED Medications  gabapentin (NEURONTIN) capsule 300 mg (300 mg Oral Given 11/20/16 1708)     Initial Impression / Assessment and Plan / ED Course  I have reviewed the  triage vital signs and the nursing notes.  Pertinent labs & imaging results that were available during my care of the patient were reviewed by me and considered in my medical decision making (see chart for details).     76 year old male who presents with syncopal episode. He is well-appearing and in no acute distress on my evaluation. Vital signs are stable. EKG without stigmata of arrhythmia. Basic blood work is unremarkable. Given complaints of daily headaches for 8 years a CT head was performed to evaluate for mass, and this is negative for any acute intracranial processes. I do not think this is related to a syncopal episode today. Given that he did not have significant prodromal symptoms prior to his syncopal episode, I am concerned about arrhythmia genic cause especially since he has been evaluated for pacemaker in the past. I recommended admission for a syncopal workup, but he has declined this. He states that he would prefer to be discharged home. I discussed that we have not been able to rule out serious and life-threatening causes of his symptoms including arrhythmia or other cardiogenic processes. States that with discharge he could potentially have life-threatening process that can cause death and disability. He expressed understanding of this, and is felt to have capacity to make these decisions. Will discharge home with strict return and follow-up instructions.  Final Clinical Impressions(s) / ED Diagnoses   Final diagnoses:  Syncope and collapse    New Prescriptions New Prescriptions   No medications on file     Lavera Guise, MD 11/20/16 [phone]

## 2016-11-20 NOTE — ED Notes (Signed)
Bed: [id number] Expected date:  Expected time:  Means of arrival:  Comments: EMS 76 y/o syncope, emesis

## 2016-11-20 NOTE — Discharge Instructions (Signed)
We recommend that you stay in the hospital for observation overnight, but you prefer to be discharged even though not all life-threatening or emergent problems have been ruled out Please return without fail for worsening symptoms, including recurrent passing out, fatigue, confusion, difficulty breathing, chest pain or any other symptoms concerning to you.

## 2016-11-26 ENCOUNTER — Emergency Department (HOSPITAL_COMMUNITY)
Admission: EM | Admit: 2016-11-26 | Discharge: 2016-11-26 | Discharge status: Against Medical Advice | Payer: Medicare PPO | Attending: Emergency Medicine | Admitting: Emergency Medicine

## 2016-11-26 ENCOUNTER — Encounter (HOSPITAL_COMMUNITY): Payer: Self-pay | Admitting: Emergency Medicine

## 2016-11-26 DIAGNOSIS — I10 Essential (primary) hypertension: Secondary | ICD-10-CM | POA: Insufficient documentation

## 2016-11-26 DIAGNOSIS — F17210 Nicotine dependence, cigarettes, uncomplicated: Secondary | ICD-10-CM | POA: Insufficient documentation

## 2016-11-26 DIAGNOSIS — Z8673 Personal history of transient ischemic attack (TIA), and cerebral infarction without residual deficits: Secondary | ICD-10-CM | POA: Insufficient documentation

## 2016-11-26 DIAGNOSIS — R42 Dizziness and giddiness: Secondary | ICD-10-CM | POA: Diagnosis not present

## 2016-11-26 LAB — BASIC METABOLIC PANEL
ANION GAP: 7 (ref 5–15)
BUN: 8 mg/dL (ref 6–20)
CALCIUM: 9.1 mg/dL (ref 8.9–10.3)
CHLORIDE: 102 mmol/L (ref 101–111)
CO2: 31 mmol/L (ref 22–32)
Creatinine, Ser: 0.89 mg/dL (ref 0.61–1.24)
GFR calc Af Amer: >60 mL/min (ref >60)
GFR calc non Af Amer: >60 mL/min (ref >60)
GLUCOSE: 104 mg/dL — AB (ref 65–99)
POTASSIUM: 4.4 mmol/L (ref 3.5–5.1)
Sodium: 140 mmol/L (ref 135–145)

## 2016-11-26 LAB — CBC
HEMATOCRIT: 44.3 % (ref 39.0–52.0)
HEMOGLOBIN: 14.7 g/dL (ref 13.0–17.0)
MCH: 29.1 pg (ref 26.0–34.0)
MCHC: 33.2 g/dL (ref 30.0–36.0)
MCV: 87.5 fL (ref 78.0–100.0)
Platelets: 215 10*3/uL (ref 150–400)
RBC: 5.06 MIL/uL (ref 4.22–5.81)
RDW: 13.2 % (ref 11.5–15.5)
WBC: 5.9 10*3/uL (ref 4.0–10.5)

## 2016-11-26 LAB — URINALYSIS, ROUTINE W REFLEX MICROSCOPIC
Bilirubin Urine: NEGATIVE
GLUCOSE, UA: NEGATIVE mg/dL
HGB URINE DIPSTICK: NEGATIVE
KETONES UR: NEGATIVE mg/dL
Leukocytes, UA: NEGATIVE
Nitrite: NEGATIVE
PROTEIN: 30 mg/dL — AB
Specific Gravity, Urine: 1.017 (ref 1.005–1.030)
pH: 7 (ref 5.0–8.0)

## 2016-11-26 NOTE — ED Provider Notes (Signed)
[facility]ER[facility] DEPT Provider Note   CSN: [id number] Arrival date & time: 11/26/16  1236     History   Chief Complaint Chief Complaint  Patient presents with  . Dizziness    HPI Roberto Wallace. is a 76 y.o. male.  HPI    Roberto Wallace. is a 76 y.o. male, with a history of HTN, presenting to the ED with an episode of lightheadedness that occurred suddenly today. Patient states he was driving when he suddenly felt lightheaded. He drove immediately to the ED. Sensation lasted for about 1 hour. Currently feels normal. Ate a regular breakfast this morning. Admits to poor fluid intake and states he has "had a busy day out and about." Patient has also been under increased stress as his father died yesterday. Denies recent changes in medications. States he is usually compliant with his medications, but did not take them today because he was in a hurry to get to the [facility].   Denies CP, SOB, N/V/C/D, abdominal pain, LOC, bleeding, falls/trauma, neck/back pain, neuro deficits, or any other complaints.     Past Medical History:  Diagnosis Date  . Arthritis   . Chicken pox   . Depression   . Hypercholesteremia   . Hypertension   . Migraine   . Stroke (HCC)    No residual effects  . Syphilis     Patient Active Problem List   Diagnosis Date Noted  . Right wrist pain 06/09/2016  . Osteoarthritis 02/05/2016  . Tuberculosis 02/05/2016  . Loss of weight 10/31/2015  . 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 Neuropathy (HCC) 10/03/2015  . Intermittent memory loss 10/03/2015  . Decreased mobility 10/03/2015  . Routine general medical examination at a health care facility 04/16/2015  . Medicare annual wellness visit, subsequent 04/16/2015  . TOBACCO USER 06/14/2010  . Depression 11/19/2009  . HYPERTENSION, BENIGN ESSENTIAL 11/19/2009  . OTHER SPECIFIED CARDIAC DYSRHYTHMIAS 11/19/2009  . GEN OSTEOARTHROSIS INVOLVING MULTIPLE SITES 11/19/2009    . History of cardiovascular disorder 11/19/2009    Past Surgical History:  Procedure Laterality Date  . BACK SURGERY    . HERNIA REPAIR         Home Medications    Prior to Admission medications   Medication Sig Start Date End Date Taking? Authorizing Provider  buPROPion (ZYBAN) 150 MG 12 hr tablet Take 1 tablet by mouth daily for 3 days then 1 tablet by mouth twice daily. Patient taking differently: Take 150 mg by mouth 2 (two) times daily.  02/05/16  Yes Veryl Speak, FNP  donepezil (ARICEPT) 10 MG tablet Take 1 tablet (10 mg total) by mouth at bedtime. 09/19/16  Yes Adam Mliss Fritz, DO  gabapentin (NEURONTIN) 300 MG capsule Take 1 capsule (300 mg total) by mouth 3 (three) times daily. 07/29/16  Yes Veryl Speak, FNP  hydrochlorothiazide (HYDRODIURIL) 12.5 MG tablet Take 1 tablet (12.5 mg total) by mouth daily. 07/29/16  Yes Veryl Speak, FNP  meloxicam (MOBIC) 15 MG tablet TAKE ONE TABLET BY MOUTH  DAILY 07/29/16  Yes Veryl Speak, FNP  Multiple Vitamins-Minerals (CENTRUM SILVER ADULT 50+) TABS Take 1 capsule by mouth daily. 03/27/15  Yes Veryl Speak, FNP  pravastatin (PRAVACHOL) 10 MG tablet Take 1 tablet (10 mg total) by mouth daily. 07/29/16  Yes Veryl Speak, FNP  sertraline (ZOLOFT) 25 MG tablet Take 1 tablet (25 mg total) by mouth daily. 07/29/16  Yes Veryl Speak, FNP  vitamin B-12 (CYANOCOBALAMIN) 100 MCG tablet Take 100 mcg by mouth daily.   Yes Historical Provider, MD  bisacodyl (DULCOLAX) 10 MG suppository Place 10 mg rectally as needed for moderate constipation.    Historical Provider, MD  nicotine (NICODERM CQ) 21 mg/24hr patch Place 1 patch (21 mg total) onto the skin daily. Patient not taking: Reported on 11/20/2016 03/27/15   Veryl Speak, FNP  phenylephrine (SUDAFED PE) 10 MG TABS tablet Take 10 mg by mouth every 4 (four) hours as needed (pain).     Historical Provider, MD    Family History Family History  Problem Relation Age of Onset  .  Healthy Mother   . Healthy Father     Social History Social History  Substance Use Topics  . Smoking status: Current Every Day Smoker    Packs/day: 0.33    Years: 50.00    Types: Cigarettes  . Smokeless tobacco: Never Used  . Alcohol use 1.2 - 1.8 oz/week    2 - 3 Shots of liquor per week     Comment: social      Allergies   Patient has no known allergies.   Review of Systems Review of Systems  Constitutional: Negative for fever.  Respiratory: Negative for shortness of breath.   Cardiovascular: Negative for chest pain.  Gastrointestinal: Negative for abdominal pain, blood in stool, constipation, diarrhea, nausea and vomiting.  Genitourinary: Negative for decreased urine volume.  Musculoskeletal: Negative for back pain and neck pain.  Skin: Negative for pallor and rash.  Neurological: Positive for light-headedness. Negative for syncope, weakness and headaches.  All other systems reviewed and are negative.    Physical Exam Updated Vital Signs BP (!) 141/102 (BP Location: Left Arm)   Pulse 74   Temp 98.7 F (37.1 C) (Oral)   Resp 16   Ht  (1.753 m)   Wt 56.7 kg   SpO2 100%   BMI 18.46 kg/m   Physical Exam  Constitutional: He is oriented to person, place, and time. He appears well-developed and well-nourished. No distress.  HENT:  Head: Normocephalic and atraumatic.  Mouth/Throat: Oropharynx is clear and moist.  Eyes: Conjunctivae and EOM are normal. Pupils are equal, round, and reactive to light.  Neck: Neck supple.  Cardiovascular: Normal rate, regular rhythm, normal heart sounds and intact distal pulses.   Pulmonary/Chest: Effort normal and breath sounds normal. No respiratory distress.  Abdominal: Soft. There is no tenderness. There is no guarding.  Musculoskeletal: He exhibits no edema.  Normal motor function intact in all extremities and spine. No midline spinal tenderness.   Lymphadenopathy:    He has no cervical adenopathy.  Neurological: He is  alert and oriented to person, place, and time.  No sensory deficits. Strength 5/5 in all extremities. No gait disturbance from baseline; patient walks with a cane; able to ambulate without assistance. Coordination intact including heel to shin and finger to nose. Cranial nerves III-XII grossly intact. No facial droop.   Skin: Skin is warm and dry. He is not diaphoretic.  Psychiatric: He has a normal mood and affect. His behavior is normal.  Nursing note and vitals reviewed.    ED Treatments / Results  Labs (all labs ordered are listed, but only abnormal results are displayed) Labs Reviewed  BASIC METABOLIC PANEL - Abnormal; Notable for the following:       Result Value   Glucose, Bld 104 (*)    All other components within normal limits  URINALYSIS, ROUTINE W REFLEX  MICROSCOPIC - Abnormal; Notable for the following:    Protein, ur 30 (*)    Bacteria, UA RARE (*)    Squamous Epithelial / LPF 0-5 (*)    All other components within normal limits  CBC  CBG MONITORING, ED  I-STAT TROPOININ, ED    EKG  EKG Interpretation  Date/Time:  Wednesday November 26 2016 13:27:37 EDT Ventricular Rate:  83 PR Interval:    QRS Duration: 91 QT Interval:  368 QTC Calculation: 433 R Axis:   88 Text Interpretation:  Sinus rhythm Borderline right axis deviation No significant change since last tracing Confirmed by KNAPP  MD-J, JON ([id number]) on 11/26/2016 1:37:57 PM Also confirmed by KNAPP  MD-J, JON [phone]), editor [place] CT, Jola Babinski [phone])  on 11/26/2016 2:05:48 PM       Radiology No results found.  Procedures Procedures (including critical care time)  Medications Ordered in ED Medications - No data to display   Initial Impression / Assessment and Plan / ED Course  I have reviewed the triage vital signs and the nursing notes.  Pertinent labs & imaging results that were available during my care of the patient were reviewed by me and considered in my medical decision making (see chart for  details).      Patient presents with an episode of lightheadedness today. Patient has been recently seen in the ED for syncope without a Discernible cause. EDP who evaluated the patient at that time recommended admission, however, patient refused. It was recommended to the patient that he allow admission today for continued evaluation for his recent issues, however, he refused. Patient was advised he should not drive home if he is having episodes of syncope and lightheadedness. Patient also refused troponin, orthostatics, and CBG. I spoke with the patient regarding our recommendations. He acknowledged this information and this recommendation, but still refused. He states, "I will get further evaluation later, but right now I am going home." Patient acknowledges the possible risks of refusing admission and continued evaluation. He states he understands he will be leaving AGAINST MEDICAL ADVICE. Patient was told that he is welcome and encouraged to return at any time.  Findings and plan of care discussed with Tilden Fossa, MD. Dr. Madilyn Hook personally evaluated and examined this patient.  Vitals:   11/26/16 1307 11/26/16 1531 11/26/16 1735 11/26/16 1736  BP: (!) 132/93 (!) 141/102 (!) 134/101   Pulse: 92 74 75   Resp: Temp: 98.7 F (37.1 C)   98.3 F (36.8 C)  TempSrc: Oral   Oral  SpO2: 100% 100% 100%   Weight: 56.7 kg     Height:  (1.753 m)         Final Clinical Impressions(s) / ED Diagnoses   Final diagnoses:  Lightheadedness    New Prescriptions New Prescriptions   No medications on file     Concepcion Living 11/26/16 1852    Tilden Fossa, MD 11/27/16 1558

## 2016-11-26 NOTE — ED Triage Notes (Signed)
Per pt, states he was driving and became dizzy-started around 1236-drove directly to hospital-states he became diaphoretic-states he was seen Thursday for the same thing-states he has not taking his meds today

## 2016-11-26 NOTE — ED Notes (Signed)
Pt refuses cardiac monitoring and refused to let writer check his CBG.

## 2016-11-26 NOTE — ED Notes (Signed)
Pt refuses I-stat troponin blood draw. States that he wants a wheel chair and to leave immediately. RN aware

## 2016-11-26 NOTE — Discharge Instructions (Signed)
You have been seen today for an episode of lightheadedness. It was recommended that you undergo further testing and admission to the hospital for observation and continued evaluation. You have chosen to leave AGAINST MEDICAL ADVICE. You are encouraged to return at any time should you change your mind. We recommend that at the very least you follow up with your primary care provider as soon as possible. You may also need to follow up with cardiology.

## 2016-11-26 NOTE — ED Notes (Signed)
Pt is refusing orthostatic vital signs. States that he is ready to go home and does not need vital signs checked.

## 2016-11-28 ENCOUNTER — Telehealth: Payer: Self-pay

## 2016-11-28 NOTE — Telephone Encounter (Signed)
Called to let pt know that his handicap form is ready for pick up at the front desk.

## 2016-12-12 DIAGNOSIS — M159 Polyosteoarthritis, unspecified: Secondary | ICD-10-CM | POA: Diagnosis not present

## 2016-12-12 DIAGNOSIS — I6350 Cerebral infarction due to unspecified occlusion or stenosis of unspecified cerebral artery: Secondary | ICD-10-CM | POA: Diagnosis not present

## 2016-12-12 DIAGNOSIS — G629 Polyneuropathy, unspecified: Secondary | ICD-10-CM | POA: Diagnosis not present

## 2016-12-15 ENCOUNTER — Other Ambulatory Visit: Payer: Self-pay | Admitting: Family

## 2016-12-15 DIAGNOSIS — I10 Essential (primary) hypertension: Secondary | ICD-10-CM

## 2016-12-21 DIAGNOSIS — G629 Polyneuropathy, unspecified: Secondary | ICD-10-CM | POA: Diagnosis not present

## 2016-12-21 DIAGNOSIS — I69354 Hemiplegia and hemiparesis following cerebral infarction affecting left non-dominant side: Secondary | ICD-10-CM | POA: Diagnosis not present

## 2016-12-21 DIAGNOSIS — F330 Major depressive disorder, recurrent, mild: Secondary | ICD-10-CM | POA: Diagnosis not present

## 2016-12-21 DIAGNOSIS — H547 Unspecified visual loss: Secondary | ICD-10-CM | POA: Diagnosis not present

## 2016-12-21 DIAGNOSIS — I10 Essential (primary) hypertension: Secondary | ICD-10-CM | POA: Diagnosis not present

## 2016-12-21 DIAGNOSIS — E785 Hyperlipidemia, unspecified: Secondary | ICD-10-CM | POA: Diagnosis not present

## 2016-12-21 DIAGNOSIS — M545 Low back pain: Secondary | ICD-10-CM | POA: Diagnosis not present

## 2016-12-21 DIAGNOSIS — F0390 Unspecified dementia without behavioral disturbance: Secondary | ICD-10-CM | POA: Diagnosis not present

## 2016-12-21 DIAGNOSIS — M1990 Unspecified osteoarthritis, unspecified site: Secondary | ICD-10-CM | POA: Diagnosis not present

## 2017-01-11 DIAGNOSIS — M159 Polyosteoarthritis, unspecified: Secondary | ICD-10-CM | POA: Diagnosis not present

## 2017-01-11 DIAGNOSIS — G629 Polyneuropathy, unspecified: Secondary | ICD-10-CM | POA: Diagnosis not present

## 2017-01-11 DIAGNOSIS — I6350 Cerebral infarction due to unspecified occlusion or stenosis of unspecified cerebral artery: Secondary | ICD-10-CM | POA: Diagnosis not present

## 2017-02-11 DIAGNOSIS — G629 Polyneuropathy, unspecified: Secondary | ICD-10-CM | POA: Diagnosis not present

## 2017-02-11 DIAGNOSIS — M159 Polyosteoarthritis, unspecified: Secondary | ICD-10-CM | POA: Diagnosis not present

## 2017-02-11 DIAGNOSIS — I6350 Cerebral infarction due to unspecified occlusion or stenosis of unspecified cerebral artery: Secondary | ICD-10-CM | POA: Diagnosis not present

## 2017-02-19 ENCOUNTER — Other Ambulatory Visit: Payer: Self-pay | Admitting: Family

## 2017-02-19 DIAGNOSIS — I10 Essential (primary) hypertension: Secondary | ICD-10-CM

## 2017-03-13 DIAGNOSIS — I6350 Cerebral infarction due to unspecified occlusion or stenosis of unspecified cerebral artery: Secondary | ICD-10-CM | POA: Diagnosis not present

## 2017-03-13 DIAGNOSIS — G629 Polyneuropathy, unspecified: Secondary | ICD-10-CM | POA: Diagnosis not present

## 2017-03-13 DIAGNOSIS — M159 Polyosteoarthritis, unspecified: Secondary | ICD-10-CM | POA: Diagnosis not present

## 2017-04-09 ENCOUNTER — Emergency Department (HOSPITAL_COMMUNITY)
Admission: EM | Admit: 2017-04-09 | Discharge: 2017-04-09 | Disposition: A | Discharge status: Home / Self Care | Payer: Medicare PPO | Attending: Emergency Medicine | Admitting: Emergency Medicine

## 2017-04-09 ENCOUNTER — Encounter (HOSPITAL_COMMUNITY): Payer: Self-pay | Admitting: Nurse Practitioner

## 2017-04-09 DIAGNOSIS — F17210 Nicotine dependence, cigarettes, uncomplicated: Secondary | ICD-10-CM | POA: Diagnosis not present

## 2017-04-09 DIAGNOSIS — F10929 Alcohol use, unspecified with intoxication, unspecified: Secondary | ICD-10-CM | POA: Diagnosis not present

## 2017-04-09 DIAGNOSIS — F10920 Alcohol use, unspecified with intoxication, uncomplicated: Secondary | ICD-10-CM | POA: Diagnosis not present

## 2017-04-09 DIAGNOSIS — Z79899 Other long term (current) drug therapy: Secondary | ICD-10-CM | POA: Insufficient documentation

## 2017-04-09 DIAGNOSIS — I10 Essential (primary) hypertension: Secondary | ICD-10-CM | POA: Insufficient documentation

## 2017-04-09 DIAGNOSIS — F10120 Alcohol abuse with intoxication, uncomplicated: Secondary | ICD-10-CM | POA: Diagnosis not present

## 2017-04-09 DIAGNOSIS — F10129 Alcohol abuse with intoxication, unspecified: Secondary | ICD-10-CM | POA: Diagnosis not present

## 2017-04-09 NOTE — ED Triage Notes (Signed)
Patient found by neighbor on the found face down. Patient didn't know who his neighbor was and she called EMS. Patient had been drinking ETOH heavily.

## 2017-04-09 NOTE — ED Notes (Signed)
[facility] Expected date:  Expected time:  Means of arrival:  Comments: EMS- 75yo M, Fall/ETOH

## 2017-04-09 NOTE — ED Notes (Addendum)
After being provided a urinal, Pt yelled "f- it" and urinated in the bed/floor.  Pt belligerent and yelling at staff.  Pt unable to be redirected.

## 2017-04-09 NOTE — ED Provider Notes (Signed)
[facility]-EMERGENCY DEPT Provider Note   CSN: [id number] Arrival date & time: 04/09/17  1139     History   Chief Complaint No chief complaint on file.   HPI Roberto RosCharles O Lookingbill [name] is a 76 y.o. male.  HPI Pt reports drinking ETOH and requesting assistance with alcohol abuse. Wants help with ETOH and depression. No SI or HI. Ambulatory in the ER. States "I don't want to be here". Walking around and yelling at staff that "no one will help me".    Past Medical History:  Diagnosis Date  . Arthritis   . Chicken pox   . Depression   . Hypercholesteremia   . Hypertension   . Migraine   . Stroke (HCC)    No residual effects  . Syphilis     Patient Active Problem List   Diagnosis Date Noted  . Right wrist pain 06/09/2016  . Osteoarthritis 02/05/2016  . Tuberculosis 02/05/2016  . Loss of weight 10/31/2015  . 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 Neuropathy 10/03/2015  . Intermittent memory loss 10/03/2015  . Decreased mobility 10/03/2015  . Routine general medical examination at a health care facility 04/16/2015  . Medicare annual wellness visit, subsequent 04/16/2015  . TOBACCO USER 06/14/2010  . Depression 11/19/2009  . HYPERTENSION, BENIGN ESSENTIAL 11/19/2009  . OTHER SPECIFIED CARDIAC DYSRHYTHMIAS 11/19/2009  . GEN OSTEOARTHROSIS INVOLVING MULTIPLE SITES 11/19/2009  . History of cardiovascular disorder 11/19/2009    Past Surgical History:  Procedure Laterality Date  . BACK SURGERY    . HERNIA REPAIR         Home Medications    Prior to Admission medications   Medication Sig Start Date End Date Taking? Authorizing Provider  bisacodyl (DULCOLAX) 10 MG suppository Place 10 mg rectally as needed for moderate constipation.    [provider]  buPROPion (ZYBAN) 150 MG 12 hr tablet Take 1 tablet by mouth daily for 3 days then 1 tablet by mouth twice daily. Patient taking differently: Take 150 mg by mouth 2 (two) times daily.   02/05/16   Veryl Speakalone, Gregory D, FNP  donepezil (ARICEPT) 10 MG tablet Take 1 tablet (10 mg total) by mouth at bedtime. 09/19/16   Drema DallasJaffe, Adam R, DO  gabapentin (NEURONTIN) 300 MG capsule Take 1 capsule (300 mg total) by mouth 3 (three) times daily. 07/29/16   Veryl Speakalone, Gregory D, FNP  hydrochlorothiazide (HYDRODIURIL) 12.5 MG tablet Take 1 tablet (12.5 mg total) by mouth daily. Yearly physical due in June must have appt for future refills 12/16/16   Veryl Speakalone, Gregory D, FNP  meloxicam (MOBIC) 15 MG tablet TAKE ONE TABLET BY MOUTH  DAILY 07/29/16   Veryl Speakalone, Gregory D, FNP  Multiple Vitamins-Minerals (CENTRUM SILVER ADULT 50+) TABS Take 1 capsule by mouth daily. 03/27/15   Veryl Speakalone, Gregory D, FNP  nicotine (NICODERM CQ) 21 mg/24hr patch Place 1 patch (21 mg total) onto the skin daily. Patient not taking: Reported on 11/20/2016 03/27/15   Veryl Speakalone, Gregory D, FNP  phenylephrine (SUDAFED PE) 10 MG TABS tablet Take 10 mg by mouth every 4 (four) hours as needed (pain).     [provider]  pravastatin (PRAVACHOL) 10 MG tablet Take 1 tablet (10 mg total) by mouth daily. Yearly physical due in June must have appt for future refills 12/16/16   Veryl Speakalone, Gregory D, FNP  sertraline (ZOLOFT) 25 MG tablet Take 1 tablet (25 mg total) by mouth daily. Yearly physical due in June must have appt for future  refills 12/16/16   Veryl Speak, FNP  vitamin B-12 (CYANOCOBALAMIN) 100 MCG tablet Take 100 mcg by mouth daily.    [provider]    Family History Family History  Problem Relation Age of Onset  . Healthy Mother   . Healthy Father     Social History Social History  Substance Use Topics  . Smoking status: Current Every Day Smoker    Packs/day: 0.33    Years: 50.00    Types: Cigarettes  . Smokeless tobacco: Never Used  . Alcohol use 1.2 - 1.8 oz/week    2 - 3 Shots of liquor per week     Comment: social      Allergies   Patient has no known allergies.   Review of Systems Review of Systems    Constitutional: Negative for fever.  Cardiovascular: Negative for chest pain.  Gastrointestinal: Negative for abdominal pain.  All other systems reviewed and are negative.    Physical Exam Updated Vital Signs BP 114/81   Pulse 84   Temp 98.5 F (36.9 C) (Oral)   Resp 18   Ht 5\' 6"  (1.676 m)   Wt 54.4 kg (120 lb)   SpO2 98%   BMI 19.37 kg/m   Physical Exam  Constitutional: He is oriented to person, place, and time. He appears well-developed and well-nourished.  HENT:  Head: Normocephalic and atraumatic.  Eyes: EOM are normal.  Neck: Normal range of motion.  Cardiovascular: Normal rate, regular rhythm and normal heart sounds.   Pulmonary/Chest: Effort normal and breath sounds normal. No respiratory distress.  Abdominal: Soft. He exhibits no distension. There is no tenderness.  Musculoskeletal: Normal range of motion.  Neurological: He is alert and oriented to person, place, and time.  Skin: Skin is warm and dry.  Psychiatric: He has a normal mood and affect. Judgment normal.  Nursing note and vitals reviewed.    ED Treatments / Results  Labs (all labs ordered are listed, but only abnormal results are displayed) Labs Reviewed - No data to display  EKG  EKG Interpretation None       Radiology No results found.  Procedures Procedures (including critical care time)  Medications Ordered in ED Medications - No data to display   Initial Impression / Assessment and Plan / ED Course  I have reviewed the triage vital signs and the nursing notes.  Pertinent labs & imaging results that were available during my care of the patient were reviewed by me and considered in my medical decision making (see chart for details).     Pt intoxicated and yelling at staff. Stable vitals. Ambulatory in the ER. Discharged home. Offered outpatient resources for ETOH abuse. Refuses resources, states  "i want detox".  MSE complete. No life threatening emergency. Pt will be  discharged from the ER.  Police officers dealing with the patient at this time.   Final Clinical Impressions(s) / ED Diagnoses   Final diagnoses:  Alcoholic intoxication without complication [facility])    New Prescriptions New Prescriptions   No medications on file     Azalia Bilis, MD 04/09/17 1215

## 2017-04-13 DIAGNOSIS — M159 Polyosteoarthritis, unspecified: Secondary | ICD-10-CM | POA: Diagnosis not present

## 2017-04-13 DIAGNOSIS — I6350 Cerebral infarction due to unspecified occlusion or stenosis of unspecified cerebral artery: Secondary | ICD-10-CM | POA: Diagnosis not present

## 2017-04-13 DIAGNOSIS — G629 Polyneuropathy, unspecified: Secondary | ICD-10-CM | POA: Diagnosis not present

## 2017-04-23 ENCOUNTER — Telehealth: Payer: Self-pay | Admitting: Family

## 2017-04-23 NOTE — Telephone Encounter (Signed)
Pt came by the office to schedule a physical and said that be brought two DMV forms by the office for Tammy SoursGreg to do back in December. He said that they were never mailed in. One was for a handicap placard and the other was for a handicap license plate because he has two vehicles (one that has a wheelchair ramp). Do you know anything about this or is there anything we can do to get them completed?

## 2017-04-28 ENCOUNTER — Ambulatory Visit (INDEPENDENT_AMBULATORY_CARE_PROVIDER_SITE_OTHER): Payer: Medicare PPO | Admitting: Family

## 2017-04-28 ENCOUNTER — Other Ambulatory Visit (INDEPENDENT_AMBULATORY_CARE_PROVIDER_SITE_OTHER): Payer: Medicare PPO

## 2017-04-28 ENCOUNTER — Encounter: Payer: Self-pay | Admitting: Family

## 2017-04-28 VITALS — BP 130/86 | HR 85 | Temp 98.1°F | Resp 16 | Ht 66.0 in | Wt 111.0 lb

## 2017-04-28 DIAGNOSIS — F3341 Major depressive disorder, recurrent, in partial remission: Secondary | ICD-10-CM

## 2017-04-28 DIAGNOSIS — I10 Essential (primary) hypertension: Secondary | ICD-10-CM

## 2017-04-28 DIAGNOSIS — Z0001 Encounter for general adult medical examination with abnormal findings: Secondary | ICD-10-CM | POA: Diagnosis not present

## 2017-04-28 DIAGNOSIS — Z125 Encounter for screening for malignant neoplasm of prostate: Secondary | ICD-10-CM

## 2017-04-28 DIAGNOSIS — Z23 Encounter for immunization: Secondary | ICD-10-CM | POA: Diagnosis not present

## 2017-04-28 DIAGNOSIS — Z0000 Encounter for general adult medical examination without abnormal findings: Secondary | ICD-10-CM | POA: Diagnosis not present

## 2017-04-28 DIAGNOSIS — F17200 Nicotine dependence, unspecified, uncomplicated: Secondary | ICD-10-CM

## 2017-04-28 LAB — CBC
HEMATOCRIT: 46.1 % (ref 39.0–52.0)
Hemoglobin: 15.2 g/dL (ref 13.0–17.0)
MCHC: 32.9 g/dL (ref 30.0–36.0)
MCV: 92.8 fl (ref 78.0–100.0)
PLATELETS: 206 10*3/uL (ref 150.0–400.0)
RBC: 4.97 Mil/uL (ref 4.22–5.81)
RDW: 13.5 % (ref 11.5–15.5)
WBC: 5.6 10*3/uL (ref 4.0–10.5)

## 2017-04-28 LAB — COMPREHENSIVE METABOLIC PANEL
ALK PHOS: 72 U/L (ref 39–117)
ALT: 12 U/L (ref 0–53)
AST: 16 U/L (ref 0–37)
Albumin: 4.6 g/dL (ref 3.5–5.2)
BILIRUBIN TOTAL: 0.9 mg/dL (ref 0.2–1.2)
BUN: 10 mg/dL (ref 6–23)
CO2: 30 mEq/L (ref 19–32)
CREATININE: 0.87 mg/dL (ref 0.40–1.50)
Calcium: 10 mg/dL (ref 8.4–10.5)
Chloride: 101 mEq/L (ref 96–112)
GFR: 110.41 mL/min (ref >60.00)
GLUCOSE: 101 mg/dL — AB (ref 70–99)
Potassium: 3.7 mEq/L (ref 3.5–5.1)
Sodium: 141 mEq/L (ref 135–145)
TOTAL PROTEIN: 8 g/dL (ref 6.0–8.3)

## 2017-04-28 LAB — LIPID PANEL
Cholesterol: 182 mg/dL (ref 0–200)
HDL: 63.1 mg/dL (ref >39.00)
LDL Cholesterol: 102 mg/dL — ABNORMAL HIGH (ref 0–99)
NONHDL: 119.37
Total CHOL/HDL Ratio: 3
Triglycerides: 87 mg/dL (ref 0.0–149.0)
VLDL: 17.4 mg/dL (ref 0.0–40.0)

## 2017-04-28 LAB — PSA: PSA: 2.78 ng/mL (ref 0.10–4.00)

## 2017-04-28 NOTE — Progress Notes (Signed)
Subjective:    Patient ID: Roberto Rosharles O Noren [name], male    DOB: Aug 17, 1941, 76 y.o.   MRN: [id number]  Chief Complaint  Patient presents with  . CPE    fasting    HPI:  Roberto RosCharles O Cabreja [name] is a 76 y.o. male who presents today for a Medicare Annual Wellness/Physical exam.    1) Health Maintenance -   Diet - Averages about 2 meals per day with snacks consisting of regular diet; Caffeine intake 2-3 cups per day; He does have meals provided through [employer]  Exercise - Works on his exercise bike occasionally with some walking   2) Preventative Exams / Immunizations:  Dental -- Due for exam  Vision -- Due for exam    Health Maintenance  Topic Date Due  . INFLUENZA VACCINE  03/25/2017  . PNA vac Low Risk Adult (2 of 2 - PPSV23) 08/06/2017  . COLONOSCOPY  02/22/2022  . TETANUS/TDAP  04/15/2025     Immunization History  Administered Date(s) Administered  . Influenza-Unspecified 09/19/2015, 08/06/2016  . Pneumococcal Conjugate-13 04/16/2015, 08/06/2016  . Td 04/16/2015  . Zoster 05/19/2015    RISK FACTORS  Tobacco History  Smoking Status  . Current Every Day Smoker  . Packs/day: 1.00  . Years: 50.00  . Types: Cigarettes  Smokeless Tobacco  . Never Used     Cardiac risk factors: advanced age (older than 1055 for men, 3365 for women), hypertension, male gender and smoking/ tobacco exposure.  Depression Screen  [facility]  Decreased Interest 0  Down, Depressed, Hopeless 2  PHQ - 2 Score 2  Altered sleeping 3  Tired, decreased energy 0  Change in appetite 0  Feeling bad or failure about yourself  0  Trouble concentrating 2  Moving slowly or fidgety/restless 0  Suicidal thoughts 0  PHQ-9 Score 7  Difficult doing work/chores Not difficult at all     Activities of Daily Living In your present state of health, do you have any difficulty performing the following activities?:  Driving? No Managing money?  No  Feeding  yourself? No Getting from bed to chair? No Climbing a flight of stairs? Yes Preparing food and eating?: No Bathing or showering? No Getting dressed: No Getting to the toilet? No Using the toilet: No Moving around from place to place: No In the past year have you fallen or had a near fall?:No   Home Safety Has smoke detector and wears seat belts. No firearms. No excess sun exposure. Are there smokers in your home (other than you)?  No Do you feel safe at home?  Yes  Hearing Difficulties: No Do you often ask people to speak up or repeat themselves? No Do you experience ringing or noises in your ears? No  Do you have difficulty understanding soft or whispered voices? No    Cognitive Testing  Alert? Yes   Normal Appearance? Yes  Oriented to person? Yes  Place? Yes   Time? Yes  Recall of three objects?  Yes  Can perform simple calculations? Yes  Displays appropriate judgment? Yes  Can read the correct time from a watch face? Yes  Do you feel that you have a problem with memory? No  Do you often misplace items? No   Advanced Directives have been discussed with the patient? Yes   Current Physicians/Providers and Suppliers  1. Marcos EkeGreg Zakk Borgen, FNP - Internal Medicine 2. Shon MilletAdam Jaffe, DO - Neurology  Indicate any recent Medical Services you may have  received from other than [facility] providers in the past year (date may be approximate).  All answers were reviewed with the patient and necessary referrals were made:  Jeanine Luz, FNP   04/28/2017    No Known Allergies    Outpatient Medications Prior to Visit  Medication Sig Dispense Refill  . bisacodyl (DULCOLAX) 10 MG suppository Place 10 mg rectally as needed for moderate constipation.    Marland Kitchen buPROPion (ZYBAN) 150 MG 12 hr tablet Take 1 tablet by mouth daily for 3 days then 1 tablet by mouth twice daily. (Patient taking differently: Take 150 mg by mouth 2 (two) times daily. ) 60 tablet 0  . donepezil (ARICEPT) 10 MG tablet Take 1  tablet (10 mg total) by mouth at bedtime. 90 tablet 1  . gabapentin (NEURONTIN) 300 MG capsule Take 1 capsule (300 mg total) by mouth 3 (three) times daily. 270 capsule 0  . hydrochlorothiazide (HYDRODIURIL) 12.5 MG tablet Take 1 tablet (12.5 mg total) by mouth daily. Yearly physical due in June must have appt for future refills 90 tablet 0  . meloxicam (MOBIC) 15 MG tablet TAKE ONE TABLET BY MOUTH  DAILY 90 tablet 0  . Multiple Vitamins-Minerals (CENTRUM SILVER ADULT 50+) TABS Take 1 capsule by mouth daily. 100 tablet 5  . nicotine (NICODERM CQ) 21 mg/24hr patch Place 1 patch (21 mg total) onto the skin daily. 28 patch 0  . phenylephrine (SUDAFED PE) 10 MG TABS tablet Take 10 mg by mouth every 4 (four) hours as needed (pain).     . pravastatin (PRAVACHOL) 10 MG tablet Take 1 tablet (10 mg total) by mouth daily. Yearly physical due in June must have appt for future refills 90 tablet 0  . sertraline (ZOLOFT) 25 MG tablet Take 1 tablet (25 mg total) by mouth daily. Yearly physical due in June must have appt for future refills 90 tablet 0  . vitamin B-12 (CYANOCOBALAMIN) 100 MCG tablet Take 100 mcg by mouth daily.     No facility-administered medications prior to visit.      Past Medical History:  Diagnosis Date  . Arthritis   . Chicken pox   . Depression   . Hypercholesteremia   . Hypertension   . Migraine   . Stroke (HCC)    No residual effects  . Syphilis   . Tuberculosis      Past Surgical History:  Procedure Laterality Date  . BACK SURGERY    . HERNIA REPAIR       Family History  Problem Relation Age of Onset  . Healthy Mother   . Healthy Father      Social History   Social History  . Marital status: Widowed    Spouse name: N/A  . Number of children: 2  . Years of education: 14   Occupational History  . Retired    Social History Main Topics  . Smoking status: Current Every Day Smoker    Packs/day: 1.00    Years: 50.00    Types: Cigarettes  . Smokeless  tobacco: Never Used  . Alcohol use 1.2 - 1.8 oz/week    2 - 3 Shots of liquor per week     Comment: social   . Drug use: No  . Sexual activity: No   Other Topics Concern  . Not on file   Social History Narrative   Fun: Sit on the back porch and relax.   Denies religious beliefs effecting health care.    Lives alone in  a one story home.  Has 2 biological children and 3 adopted children.   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        Review of Systems  Constitutional: Denies fever, chills, fatigue, or significant weight gain/loss. HENT: Head: Denies headache or neck pain Ears: Denies changes in hearing, ringing in ears, earache, drainage Nose: Denies discharge, stuffiness, itching, nosebleed, sinus pain Throat: Denies sore throat, hoarseness, dry mouth, sores, thrush Eyes: Denies loss/changes in vision, pain, redness, blurry/double vision, flashing lights Cardiovascular: Denies chest pain/discomfort, tightness, palpitations, shortness of breath with activity, difficulty lying down, swelling, sudden awakening with shortness of breath Respiratory: Denies shortness of breath, cough, sputum production, wheezing Gastrointestinal: Denies dysphasia, heartburn, change in appetite, nausea, change in bowel habits, rectal bleeding, constipation, diarrhea, yellow skin or eyes Genitourinary: Denies frequency, urgency, burning/pain, blood in urine, incontinence, change in urinary strength. Musculoskeletal: Denies muscle/joint pain, stiffness, back pain, redness or swelling of joints, trauma Skin: Denies rashes, lumps, itching, dryness, color changes, or hair/nail changes Neurological: Denies dizziness, fainting, seizures, weakness, numbness, tingling, tremor Psychiatric - Denies nervousness, stress, depression or memory loss Endocrine: Denies heat or cold intolerance, sweating, frequent urination, excessive thirst, changes in appetite Hematologic: Denies ease of bruising or bleeding     Objective:     BP 130/86 (BP Location: Left Arm, Patient Position: Sitting, Cuff Size: Normal)   Pulse 85   Temp 98.1 F (36.7 C) (Oral)   Resp 16   Ht 5\' 6"  (1.676 m)   Wt 111 lb (50.3 kg)   SpO2 98%   BMI 17.92 kg/m  Nursing note and vital signs reviewed.  Physical Exam  Constitutional: He is oriented to person, place, and time. He appears well-nourished. He appears cachectic.  HENT:  Head: Normocephalic.  Right Ear: Hearing, tympanic membrane, external ear and ear canal normal.  Left Ear: Hearing, tympanic membrane, external ear and ear canal normal.  Nose: Nose normal.  Mouth/Throat: Uvula is midline, oropharynx is clear and moist and mucous membranes are normal.  Eyes: Pupils are equal, round, and reactive to light. Conjunctivae and EOM are normal.  Neck: Neck supple. No JVD present. No tracheal deviation present. No thyromegaly present.  Cardiovascular: Normal rate, regular rhythm, normal heart sounds and intact distal pulses.   Pulmonary/Chest: Effort normal and breath sounds normal.  Abdominal: Soft. Bowel sounds are normal. He exhibits no distension and no mass. There is no tenderness. There is no rebound and no guarding.  Musculoskeletal: Normal range of motion. He exhibits no edema or tenderness.  Lymphadenopathy:    He has no cervical adenopathy.  Neurological: He is alert and oriented to person, place, and time. He has normal reflexes. No cranial nerve deficit. He exhibits normal muscle tone. Coordination normal.  Skin: Skin is warm and dry.  Psychiatric: He has a normal mood and affect. His behavior is normal. Judgment and thought content normal.       Assessment & Plan:   During the course of the visit the patient was educated and counseled about appropriate screening and preventive services including:    Pneumococcal vaccine   Influenza vaccine  Prostate cancer screening  Colorectal cancer screening  Diabetes screening  Glaucoma  screening  Nutrition counseling   Smoking cessation counseling  Diet review for nutrition referral? Yes ____  Not Indicated _X___   Patient Instructions (the written plan) was given to the patient.  Medicare Attestation I have personally reviewed: The patient's medical and social history Their use of  alcohol, tobacco or illicit drugs Their current medications and supplements The patient's functional ability including ADLs,fall risks, home safety risks, cognitive, and hearing and visual impairment Diet and physical activities Evidence for depression or mood disorders  The patient's weight, height, BMI,  have been recorded in the chart.  I have made referrals, counseling, and provided education to the patient based on review of the above and I have provided the patient with a written personalized care plan for preventive services.     Problem List Items Addressed This Visit      Cardiovascular and Mediastinum   HYPERTENSION, BENIGN ESSENTIAL    Blood pressure is well-controlled and below goal 140/90 with current medication regimen and no adverse side effects. Continue to monitor blood pressure at home and follow low-sodium diet. Continue to monitor.        Other   TOBACCO USER    Continues to smoke approximately one quarter to one half pack daily. Emphasize importance of tobacco cessation to reduce risk for cardiovascular, respiratory, and malignant diseases in the future. He is in the precontemplation stage of quitting. Consider CT scan for lung cancer monitoring.      Depression    Depression appears adequately controlled with current medication regimen and no suicidal ideations or psychotic symptoms. Continue to monitor.       Medicare annual wellness visit, subsequent - Primary   Encounter for general adult medical examination with abnormal findings    1) Anticipatory Guidance: Discussed importance of wearing a seatbelt while driving and not texting while driving;  changing batteries in smoke detector at least once annually; wearing suntan lotion when outside; eating a balanced and moderate diet; getting physical activity at least 30 minutes per day.  2) Immunizations / Screenings / Labs:  Influenza and Pneumovax updated today. All other immunizations are up-to-date per recommendations. Due for a dental and vision exam encouraged to be completed independently. Obtain PSA for prostate cancer screening. Colon cancer screening is up-to-date per recommendations. Obtain CBC, CMET, and lipid profile.    Overall fair exam. Several risk factors for cardiovascular disease including tobacco use and hypertension. Blood pressure appears adequately controlled with current medication regimen. Discussed importance of tobacco cessation to reduce risk for cardiovascular and respiratory disease in the future. Exercise is limited secondary to decreased continued mobility due to osteoarthritis. He does continue to ambulate with a cane and a motorized wheelchair. Handicap placard request form filled in today. Continue other healthy lifestyle behaviors and choices. Follow-up prevention exam in 1 year. Follow-up office visit pending blood work and for chronic conditions.      Relevant Orders   CBC (Completed)   Comprehensive metabolic panel (Completed)   Lipid panel (Completed)   PSA (Completed)    Other Visit Diagnoses    Need for influenza vaccination       Relevant Orders   Flu vaccine HIGH DOSE PF (Fluzone High dose)   Need for 23-polyvalent pneumococcal polysaccharide vaccine       Relevant Orders   Pneumococcal polysaccharide vaccine 23-valent greater than or equal to 2yo subcutaneous/IM       I am having Roberto Wallace maintain his CENTRUM SILVER ADULT 50+, nicotine, phenylephrine, vitamin B-12, bisacodyl, buPROPion, gabapentin, meloxicam, donepezil, hydrochlorothiazide, sertraline, and pravastatin.    Follow-up: Return in about 6 months (around 10/26/2017), or if  symptoms worsen or fail to improve.   Jeanine Luz, FNP

## 2017-04-28 NOTE — Assessment & Plan Note (Signed)
1) Anticipatory Guidance: Discussed importance of wearing a seatbelt while driving and not texting while driving; changing batteries in smoke detector at least once annually; wearing suntan lotion when outside; eating a balanced and moderate diet; getting physical activity at least 30 minutes per day.  2) Immunizations / Screenings / Labs:  Influenza and Pneumovax updated today. All other immunizations are up-to-date per recommendations. Due for a dental and vision exam encouraged to be completed independently. Obtain PSA for prostate cancer screening. Colon cancer screening is up-to-date per recommendations. Obtain CBC, CMET, and lipid profile.    Overall fair exam. Several risk factors for cardiovascular disease including tobacco use and hypertension. Blood pressure appears adequately controlled with current medication regimen. Discussed importance of tobacco cessation to reduce risk for cardiovascular and respiratory disease in the future. Exercise is limited secondary to decreased continued mobility due to osteoarthritis. He does continue to ambulate with a cane and a motorized wheelchair. Handicap placard request form filled in today. Continue other healthy lifestyle behaviors and choices. Follow-up prevention exam in 1 year. Follow-up office visit pending blood work and for chronic conditions. 

## 2017-04-28 NOTE — Assessment & Plan Note (Signed)
Blood pressure is well-controlled and below goal 140/90 with current medication regimen and no adverse side effects. Continue to monitor blood pressure at home and follow low-sodium diet. Continue to monitor.

## 2017-04-28 NOTE — Patient Instructions (Addendum)
Thank you for choosing [facility].  SUMMARY AND INSTRUCTIONS:  Ice x 20 minutes every other hour as needed for your elbow.  We will check your blood work today.   Your handicap placard form has been completed.   Medication:  Your prescription(s) have been submitted to your pharmacy or been printed and provided for you. Please take as directed and contact our office if you believe you are having problem(s) with the medication(s) or have any questions.  Labs:  Please stop by the lab on the lower level of the building for your blood work. Your results will be released to MyChart (or called to you) after review, usually within 72 hours after test completion. If any changes need to be made, you will be notified at that same time.  1.) The lab is open from 7:30am to 5:30 pm Monday-Friday 2.) No appointment is necessary 3.) Fasting (if needed) is 6-8 hours after food and drink; black coffee and water are okay   Follow up:  If your symptoms worsen or fail to improve, please contact our office for further instruction, or in case of emergency go directly to the emergency room at the closest medical facility.   Health Maintenance  Topic Date Due  . INFLUENZA VACCINE  03/25/2017  . PNA vac Low Risk Adult (2 of 2 - PPSV23) 08/06/2017  . COLONOSCOPY  02/22/2022  . TETANUS/TDAP  04/15/2025    Health Maintenance, Male A healthy lifestyle and preventive care is important for your health and wellness. Ask your health care provider about what schedule of regular examinations is right for you. What should I know about weight and diet? Eat a Healthy Diet  Eat plenty of vegetables, fruits, whole grains, low-fat dairy products, and lean protein.  Do not eat a lot of foods high in solid fats, added sugars, or salt.  Maintain a Healthy Weight Regular exercise can help you achieve or maintain a healthy weight. You should:  Do at least 150 minutes of exercise each week. The exercise should  increase your heart rate and make you sweat (moderate-intensity exercise).  Do strength-training exercises at least twice a week.  Watch Your Levels of Cholesterol and Blood Lipids  Have your blood tested for lipids and cholesterol every 5 years starting at 74 years of age. If you are at high risk for heart disease, you should start having your blood tested when you are 74 years old. You may need to have your cholesterol levels checked more often if: ? Your lipid or cholesterol levels are high. ? You are older than 74 years of age. ? You are at high risk for heart disease.  What should I know about cancer screening? Many types of cancers can be detected early and may often be prevented. Lung Cancer  You should be screened every year for lung cancer if: ? You are a current smoker who has smoked for at least 30 years. ? You are a former smoker who has quit within the past 15 years.  Talk to your health care provider about your screening options, when you should start screening, and how often you should be screened.  Colorectal Cancer  Routine colorectal cancer screening usually begins at 74 years of age and should be repeated every 5-10 years until you are 74 years old. You may need to be screened more often if early forms of precancerous polyps or small growths are found. Your health care provider may recommend screening at an earlier age if  you have risk factors for colon cancer.  Your health care provider may recommend using home test kits to check for hidden blood in the stool.  A small camera at the end of a tube can be used to examine your colon (sigmoidoscopy or colonoscopy). This checks for the earliest forms of colorectal cancer.  Prostate and Testicular Cancer  Depending on your age and overall health, your health care provider may do certain tests to screen for prostate and testicular cancer.  Talk to your health care provider about any symptoms or concerns you have about  testicular or prostate cancer.  Skin Cancer  Check your skin from head to toe regularly.  Tell your health care provider about any new moles or changes in moles, especially if: ? There is a change in a mole's size, shape, or color. ? You have a mole that is larger than a pencil eraser.  Always use sunscreen. Apply sunscreen liberally and repeat throughout the day.  Protect yourself by wearing long sleeves, pants, a wide-brimmed hat, and sunglasses when outside.  What should I know about heart disease, diabetes, and high blood pressure?  If you are 51-16 years of age, have your blood pressure checked every 3-5 years. If you are 47 years of age or older, have your blood pressure checked every year. You should have your blood pressure measured twice-once when you are at a hospital or clinic, and once when you are not at a hospital or clinic. Record the average of the two measurements. To check your blood pressure when you are not at a hospital or clinic, you can use: ? An automated blood pressure machine at a pharmacy. ? A home blood pressure monitor.  Talk to your health care provider about your target blood pressure.  If you are between 34-50 years old, ask your health care provider if you should take aspirin to prevent heart disease.  Have regular diabetes screenings by checking your fasting blood sugar level. ? If you are at a normal weight and have a low risk for diabetes, have this test once every three years after the age of 110. ? If you are overweight and have a high risk for diabetes, consider being tested at a younger age or more often.  A one-time screening for abdominal aortic aneurysm (AAA) by ultrasound is recommended for men aged 65-75 years who are current or former smokers. What should I know about preventing infection? Hepatitis B If you have a higher risk for hepatitis B, you should be screened for this virus. Talk with your health care provider to find out if you are  at risk for hepatitis B infection. Hepatitis C Blood testing is recommended for:  Everyone born from [birth year] through 1963.  Anyone with known risk factors for hepatitis C.  Sexually Transmitted Diseases (STDs)  You should be screened each year for STDs including gonorrhea and chlamydia if: ? You are sexually active and are younger than 74 years of age. ? You are older than 74 years of age and your health care provider tells you that you are at risk for this type of infection. ? Your sexual activity has changed since you were last screened and you are at an increased risk for chlamydia or gonorrhea. Ask your health care provider if you are at risk.  Talk with your health care provider about whether you are at high risk of being infected with HIV. Your health care provider may recommend a prescription medicine to help prevent  HIV infection.  What else can I do?  Schedule regular health, dental, and eye exams.  Stay current with your vaccines (immunizations).  Do not use any tobacco products, such as cigarettes, chewing tobacco, and e-cigarettes. If you need help quitting, ask your health care provider.  Limit alcohol intake to no more than 2 drinks per day. One drink equals 12 ounces of beer, 5 ounces of wine, or 1 ounces of hard liquor.  Do not use street drugs.  Do not share needles.  Ask your health care provider for help if you need support or information about quitting drugs.  Tell your health care provider if you often feel depressed.  Tell your health care provider if you have ever been abused or do not feel safe at home. This information is not intended to replace advice given to you by your health care provider. Make sure you discuss any questions you have with your health care provider. Document Released: 02/07/2008 Document Revised: 04/09/2016 Document Reviewed: 05/15/2015 Elsevier Interactive Patient Education  [employer].

## 2017-04-28 NOTE — Assessment & Plan Note (Signed)
Depression appears adequately controlled with current medication regimen and no suicidal ideations or psychotic symptoms. Continue to monitor.

## 2017-04-28 NOTE — Assessment & Plan Note (Deleted)
1) Anticipatory Guidance: Discussed importance of wearing a seatbelt while driving and not texting while driving; changing batteries in smoke detector at least once annually; wearing suntan lotion when outside; eating a balanced and moderate diet; getting physical activity at least 30 minutes per day.  2) Immunizations / Screenings / Labs:  Influenza and Pneumovax updated today. All other immunizations are up-to-date per recommendations. Due for a dental and vision exam encouraged to be completed independently. Obtain PSA for prostate cancer screening. Colon cancer screening is up-to-date per recommendations. Obtain CBC, CMET, and lipid profile.    Overall fair exam. Several risk factors for cardiovascular disease including tobacco use and hypertension. Blood pressure appears adequately controlled with current medication regimen. Discussed importance of tobacco cessation to reduce risk for cardiovascular and respiratory disease in the future. Exercise is limited secondary to decreased continued mobility due to osteoarthritis. He does continue to ambulate with a cane and a motorized wheelchair. Handicap placard request form filled in today. Continue other healthy lifestyle behaviors and choices. Follow-up prevention exam in 1 year. Follow-up office visit pending blood work and for chronic conditions.

## 2017-04-28 NOTE — Telephone Encounter (Signed)
Pt in for an office visit today.

## 2017-04-28 NOTE — Assessment & Plan Note (Signed)
Continues to smoke approximately one quarter to one half pack daily. Emphasize importance of tobacco cessation to reduce risk for cardiovascular, respiratory, and malignant diseases in the future. He is in the precontemplation stage of quitting. Consider CT scan for lung cancer monitoring.

## 2017-05-14 DIAGNOSIS — G629 Polyneuropathy, unspecified: Secondary | ICD-10-CM | POA: Diagnosis not present

## 2017-05-14 DIAGNOSIS — I6350 Cerebral infarction due to unspecified occlusion or stenosis of unspecified cerebral artery: Secondary | ICD-10-CM | POA: Diagnosis not present

## 2017-05-14 DIAGNOSIS — M159 Polyosteoarthritis, unspecified: Secondary | ICD-10-CM | POA: Diagnosis not present

## 2017-06-10 ENCOUNTER — Telehealth: Payer: Self-pay | Admitting: Family

## 2017-06-10 DIAGNOSIS — G629 Polyneuropathy, unspecified: Secondary | ICD-10-CM

## 2017-06-10 DIAGNOSIS — I10 Essential (primary) hypertension: Secondary | ICD-10-CM

## 2017-06-10 MED ORDER — SERTRALINE HCL 25 MG PO TABS: 25.0000 mg | ORAL_TABLET | Freq: Every day | ORAL | 0 refills | Status: DC

## 2017-06-10 MED ORDER — PRAVASTATIN SODIUM 10 MG PO TABS: 10.0000 mg | ORAL_TABLET | Freq: Every day | ORAL | 0 refills | Status: DC

## 2017-06-10 MED ORDER — HYDROCHLOROTHIAZIDE 12.5 MG PO TABS: 12.5000 mg | ORAL_TABLET | Freq: Every day | ORAL | 0 refills | Status: DC

## 2017-06-10 MED ORDER — GABAPENTIN 300 MG PO CAPS: 300.0000 mg | ORAL_CAPSULE | Freq: Three times a day (TID) | ORAL | 0 refills | Status: DC

## 2017-06-10 MED ORDER — MELOXICAM 15 MG PO TABS: ORAL_TABLET | ORAL | 0 refills | Status: DC

## 2017-06-10 NOTE — Telephone Encounter (Signed)
Patient has appt set up to tranfer to Dr. [name] on 10/31.  Is requesting refills on all medications to [facility].  Patient states he has dementia? and can't remember what medications he takes.

## 2017-06-10 NOTE — Telephone Encounter (Signed)
Pt had his annual w/Greg in sept. Inform will send 90 day scripts on maintenance meds. Must keep appt w/new provider for refills...Raechel Chute/lmb

## 2017-06-13 DIAGNOSIS — I6350 Cerebral infarction due to unspecified occlusion or stenosis of unspecified cerebral artery: Secondary | ICD-10-CM | POA: Diagnosis not present

## 2017-06-13 DIAGNOSIS — G629 Polyneuropathy, unspecified: Secondary | ICD-10-CM | POA: Diagnosis not present

## 2017-06-13 DIAGNOSIS — M159 Polyosteoarthritis, unspecified: Secondary | ICD-10-CM | POA: Diagnosis not present

## 2017-06-24 ENCOUNTER — Encounter: Payer: Self-pay | Admitting: Internal Medicine

## 2017-06-24 ENCOUNTER — Ambulatory Visit (INDEPENDENT_AMBULATORY_CARE_PROVIDER_SITE_OTHER): Payer: Medicare PPO | Admitting: Internal Medicine

## 2017-06-24 VITALS — BP 102/68 | HR 85 | Temp 97.8°F | Ht 66.0 in | Wt 117.0 lb

## 2017-06-24 DIAGNOSIS — Z0000 Encounter for general adult medical examination without abnormal findings: Secondary | ICD-10-CM

## 2017-06-24 DIAGNOSIS — I10 Essential (primary) hypertension: Secondary | ICD-10-CM

## 2017-06-24 DIAGNOSIS — M7711 Lateral epicondylitis, right elbow: Secondary | ICD-10-CM | POA: Diagnosis not present

## 2017-06-24 DIAGNOSIS — F419 Anxiety disorder, unspecified: Secondary | ICD-10-CM | POA: Diagnosis not present

## 2017-06-24 DIAGNOSIS — F4310 Post-traumatic stress disorder, unspecified: Secondary | ICD-10-CM | POA: Diagnosis not present

## 2017-06-24 DIAGNOSIS — G629 Polyneuropathy, unspecified: Secondary | ICD-10-CM | POA: Diagnosis not present

## 2017-06-24 DIAGNOSIS — F329 Major depressive disorder, single episode, unspecified: Secondary | ICD-10-CM | POA: Diagnosis not present

## 2017-06-24 DIAGNOSIS — F32A Depression, unspecified: Secondary | ICD-10-CM

## 2017-06-24 HISTORY — DX: Post-traumatic stress disorder, unspecified: F43.10

## 2017-06-24 MED ORDER — DONEPEZIL HCL 10 MG PO TABS: 10.0000 mg | ORAL_TABLET | Freq: Every day | ORAL | 3 refills | Status: DC

## 2017-06-24 MED ORDER — HYDROCHLOROTHIAZIDE 12.5 MG PO TABS: 12.5000 mg | ORAL_TABLET | Freq: Every day | ORAL | 3 refills | Status: DC

## 2017-06-24 MED ORDER — DONEPEZIL HCL 10 MG PO TABS: 10.0000 mg | ORAL_TABLET | Freq: Every day | ORAL | 1 refills | Status: DC

## 2017-06-24 MED ORDER — GABAPENTIN 300 MG PO CAPS: 300.0000 mg | ORAL_CAPSULE | Freq: Three times a day (TID) | ORAL | 1 refills | Status: DC

## 2017-06-24 MED ORDER — PRAVASTATIN SODIUM 10 MG PO TABS: 10.0000 mg | ORAL_TABLET | Freq: Every day | ORAL | 3 refills | Status: DC

## 2017-06-24 MED ORDER — SERTRALINE HCL 25 MG PO TABS
25.0000 mg | ORAL_TABLET | Freq: Every day | ORAL | 3 refills | Status: DC
Start: 2017-06-24 — End: 2019-03-10

## 2017-06-24 MED ORDER — MELOXICAM 15 MG PO TABS: ORAL_TABLET | ORAL | 3 refills | Status: DC

## 2017-06-24 MED ORDER — BISACODYL 10 MG RE SUPP: 10.0000 mg | RECTAL | 2 refills | Status: DC | PRN

## 2017-06-24 MED ORDER — BUPROPION HCL ER (SMOKING DET) 150 MG PO TB12: ORAL_TABLET | ORAL | 3 refills | Status: DC

## 2017-06-24 NOTE — Progress Notes (Signed)
Subjective:    Patient ID: Roberto Rosharles O Spangle [name], male    DOB: 31-Oct-1940, 76 y.o.   MRN: [id number]  HPI Here to f/u; overall doing ok,  Pt denies chest pain, increasing sob or doe, wheezing, orthopnea, PND, increased LE swelling, palpitations, dizziness or syncope.  Pt denies new neurological symptoms such as new headache, or facial or extremity weakness or numbness.  Pt denies polydipsia, polyuria, or low sugar episode.  Pt states overall good compliance with meds, mostly trying to follow appropriate diet, with wt overall stable  Does C/o anixety and depression, mild, no SI or HI, needs med restart.    Also has mild tender right lateral elbow area, worse to use the hand, better to rest, sharp, intermittent without swelling or rash. Due for colon screening but does not want colonscopy Past Medical History:  Diagnosis Date  . Arthritis   . Chicken pox   . Depression   . Hypercholesteremia   . Hypertension   . Migraine   . PTSD (post-traumatic stress disorder) 06/24/2017  . Stroke (HCC)    No residual effects  . Syphilis   . Tuberculosis    Past Surgical History:  Procedure Laterality Date  . BACK SURGERY    . HERNIA REPAIR      reports that he has been smoking Cigarettes.  He has a 50.00 pack-year smoking history. He has never used smokeless tobacco. He reports that he drinks about 1.2 - 1.8 oz of alcohol per week . He reports that he does not use drugs. family history includes Healthy in his father and mother. No Known Allergies Current Outpatient Prescriptions on File Prior to Visit  Medication Sig Dispense Refill  . Multiple Vitamins-Minerals (CENTRUM SILVER ADULT 50+) TABS Take 1 capsule by mouth daily. 100 tablet 5  . nicotine (NICODERM CQ) 21 mg/24hr patch Place 1 patch (21 mg total) onto the skin daily. 28 patch 0  . phenylephrine (SUDAFED PE) 10 MG TABS tablet Take 10 mg by mouth every 4 (four) hours as needed (pain).     . vitamin B-12 (CYANOCOBALAMIN) 100 MCG tablet Take  100 mcg by mouth daily.     No current facility-administered medications on file prior to visit.    Review of Systems  Constitutional: Negative for other unusual diaphoresis or sweats HENT: Negative for ear discharge or swelling Eyes: Negative for other worsening visual disturbances Respiratory: Negative for stridor or other swelling  Gastrointestinal: Negative for worsening distension or other blood Genitourinary: Negative for retention or other urinary change Musculoskeletal: Negative for other MSK pain or swelling Skin: Negative for color change or other new lesions Neurological: Negative for worsening tremors and other numbness  Psychiatric/Behavioral: Negative for worsening agitation or other fatigue All other system neg per pt    Objective:   Physical Exam BP 102/68   Pulse 85   Temp 97.8 F (36.6 C) (Oral)   Ht 5\' 6"  (1.676 m)   Wt 117 lb (53.1 kg)   SpO2 98%   BMI 18.88 kg/m  VS noted, not ill appearing Constitutional: Pt appears in NAD HENT: Head: NCAT.  Right Ear: External ear normal.  Left Ear: External ear normal.  Eyes: . Pupils are equal, round, and reactive to light. Conjunctivae and EOM are normal Nose: without d/c or deformity Neck: Neck supple. Gross normal ROM Cardiovascular: Normal rate and regular rhythm.   Pulmonary/Chest: Effort normal and breath sounds without rales or wheezing.  Abd:  Soft, NT, ND, + BS, no  organomegaly Neurological: Pt is alert. At baseline orientation, motor grossly intact Skin: Skin is warm. No rashes, other new lesions, no LE edema Psychiatric: Pt behavior is normal without agitation , 2+ nervous, mild depressed affect No other exam findings    Assessment & Plan:

## 2017-06-24 NOTE — Patient Instructions (Addendum)
We will try to refer you for the cologuard testing  Please continue all other medications as before, and refills have been done if requested.  Please have the pharmacy call with any other refills you may need.  Please continue your efforts at being more active, low cholesterol diet, and weight control..  Please keep your appointments with your specialists as you may have planned  Please return in 6 months, or sooner if needed, with Lab testing done 3-5 days before

## 2017-06-26 DIAGNOSIS — M7711 Lateral epicondylitis, right elbow: Secondary | ICD-10-CM | POA: Insufficient documentation

## 2017-06-26 NOTE — Assessment & Plan Note (Signed)
Mild, exam with mild tender only, for nsaid prn, f/u with sport med if worsening

## 2017-06-26 NOTE — Assessment & Plan Note (Signed)
stable overall by history and exam, recent data reviewed with pt, and pt to continue medical treatment as before,  to f/u any worsening symptoms or concerns BP Readings from Last 3 Encounters:  06/24/17 102/68  04/28/17 130/86  04/09/17 (!) 132/98

## 2017-06-26 NOTE — Assessment & Plan Note (Signed)
Mod uncontrolled, for med restart,  to f/u any worsening symptoms or concerns

## 2017-06-26 NOTE — Assessment & Plan Note (Signed)
For cont'd nsaid prn,  to f/u any worsening symptoms or concerns

## 2017-06-26 NOTE — Assessment & Plan Note (Signed)
Mild uncontrolled, no Si or HI, for med restart,  to f/u any worsening symptoms or concerns

## 2017-07-14 DIAGNOSIS — G629 Polyneuropathy, unspecified: Secondary | ICD-10-CM | POA: Diagnosis not present

## 2017-07-14 DIAGNOSIS — M159 Polyosteoarthritis, unspecified: Secondary | ICD-10-CM | POA: Diagnosis not present

## 2017-07-14 DIAGNOSIS — I6350 Cerebral infarction due to unspecified occlusion or stenosis of unspecified cerebral artery: Secondary | ICD-10-CM | POA: Diagnosis not present

## 2017-07-31 ENCOUNTER — Ambulatory Visit: Payer: Medicare PPO | Admitting: Neurology

## 2017-10-16 ENCOUNTER — Observation Stay (HOSPITAL_COMMUNITY): Payer: Medicare PPO

## 2017-10-16 ENCOUNTER — Emergency Department (HOSPITAL_COMMUNITY): Payer: Medicare PPO

## 2017-10-16 ENCOUNTER — Other Ambulatory Visit: Payer: Self-pay

## 2017-10-16 ENCOUNTER — Observation Stay (HOSPITAL_COMMUNITY)
Admission: EM | Admit: 2017-10-16 | Discharge: 2017-10-17 | Disposition: A | Discharge status: Home / Self Care | Payer: Medicare PPO | Attending: Internal Medicine | Admitting: Internal Medicine

## 2017-10-16 ENCOUNTER — Encounter (HOSPITAL_COMMUNITY): Payer: Self-pay | Admitting: Emergency Medicine

## 2017-10-16 DIAGNOSIS — I10 Essential (primary) hypertension: Secondary | ICD-10-CM | POA: Diagnosis not present

## 2017-10-16 DIAGNOSIS — F17210 Nicotine dependence, cigarettes, uncomplicated: Secondary | ICD-10-CM | POA: Diagnosis not present

## 2017-10-16 DIAGNOSIS — G459 Transient cerebral ischemic attack, unspecified: Principal | ICD-10-CM | POA: Insufficient documentation

## 2017-10-16 DIAGNOSIS — Z8679 Personal history of other diseases of the circulatory system: Secondary | ICD-10-CM

## 2017-10-16 DIAGNOSIS — R2689 Other abnormalities of gait and mobility: Secondary | ICD-10-CM | POA: Diagnosis present

## 2017-10-16 DIAGNOSIS — F0390 Unspecified dementia without behavioral disturbance: Secondary | ICD-10-CM

## 2017-10-16 DIAGNOSIS — F03A0 Unspecified dementia, mild, without behavioral disturbance, psychotic disturbance, mood disturbance, and anxiety: Secondary | ICD-10-CM

## 2017-10-16 DIAGNOSIS — F329 Major depressive disorder, single episode, unspecified: Secondary | ICD-10-CM | POA: Insufficient documentation

## 2017-10-16 DIAGNOSIS — F17200 Nicotine dependence, unspecified, uncomplicated: Secondary | ICD-10-CM | POA: Diagnosis present

## 2017-10-16 DIAGNOSIS — R404 Transient alteration of awareness: Secondary | ICD-10-CM | POA: Diagnosis not present

## 2017-10-16 DIAGNOSIS — F32A Depression, unspecified: Secondary | ICD-10-CM

## 2017-10-16 DIAGNOSIS — E785 Hyperlipidemia, unspecified: Secondary | ICD-10-CM | POA: Diagnosis not present

## 2017-10-16 DIAGNOSIS — Z7409 Other reduced mobility: Secondary | ICD-10-CM | POA: Insufficient documentation

## 2017-10-16 DIAGNOSIS — R42 Dizziness and giddiness: Secondary | ICD-10-CM | POA: Diagnosis not present

## 2017-10-16 DIAGNOSIS — R2990 Unspecified symptoms and signs involving the nervous system: Secondary | ICD-10-CM | POA: Diagnosis present

## 2017-10-16 DIAGNOSIS — R29818 Other symptoms and signs involving the nervous system: Secondary | ICD-10-CM | POA: Diagnosis not present

## 2017-10-16 DIAGNOSIS — G629 Polyneuropathy, unspecified: Secondary | ICD-10-CM

## 2017-10-16 DIAGNOSIS — G3184 Mild cognitive impairment, so stated: Secondary | ICD-10-CM | POA: Diagnosis not present

## 2017-10-16 DIAGNOSIS — R200 Anesthesia of skin: Secondary | ICD-10-CM | POA: Diagnosis present

## 2017-10-16 DIAGNOSIS — Z79899 Other long term (current) drug therapy: Secondary | ICD-10-CM | POA: Diagnosis not present

## 2017-10-16 DIAGNOSIS — F419 Anxiety disorder, unspecified: Secondary | ICD-10-CM | POA: Diagnosis present

## 2017-10-16 DIAGNOSIS — R251 Tremor, unspecified: Secondary | ICD-10-CM | POA: Diagnosis not present

## 2017-10-16 LAB — DIFFERENTIAL
Basophils Absolute: 0 10*3/uL (ref 0.0–0.1)
Basophils Relative: 0 %
EOS ABS: 0.2 10*3/uL (ref 0.0–0.7)
EOS PCT: 3 %
Lymphocytes Relative: 24 %
Lymphs Abs: 1.8 10*3/uL (ref 0.7–4.0)
MONO ABS: 0.4 10*3/uL (ref 0.1–1.0)
MONOS PCT: 5 %
Neutro Abs: 5 10*3/uL (ref 1.7–7.7)
Neutrophils Relative %: 68 %

## 2017-10-16 LAB — I-STAT TROPONIN, ED: TROPONIN I, POC: 0 ng/mL (ref 0.00–0.08)

## 2017-10-16 LAB — CBC
HEMATOCRIT: 44.4 % (ref 39.0–52.0)
Hemoglobin: 14.8 g/dL (ref 13.0–17.0)
MCH: 30.5 pg (ref 26.0–34.0)
MCHC: 33.3 g/dL (ref 30.0–36.0)
MCV: 91.4 fL (ref 78.0–100.0)
Platelets: 192 10*3/uL (ref 150–400)
RBC: 4.86 MIL/uL (ref 4.22–5.81)
RDW: 13.7 % (ref 11.5–15.5)
WBC: 7.4 10*3/uL (ref 4.0–10.5)

## 2017-10-16 LAB — I-STAT CHEM 8, ED
BUN: 10 mg/dL (ref 6–20)
CHLORIDE: 102 mmol/L (ref 101–111)
Calcium, Ion: 1.07 mmol/L — ABNORMAL LOW (ref 1.15–1.40)
Creatinine, Ser: 0.9 mg/dL (ref 0.61–1.24)
GLUCOSE: 89 mg/dL (ref 65–99)
HCT: 47 % (ref 39.0–52.0)
HEMOGLOBIN: 16 g/dL (ref 13.0–17.0)
Potassium: 4.2 mmol/L (ref 3.5–5.1)
SODIUM: 139 mmol/L (ref 135–145)
TCO2: 28 mmol/L (ref 22–32)

## 2017-10-16 LAB — COMPREHENSIVE METABOLIC PANEL
ALT: 16 U/L — AB (ref 17–63)
ANION GAP: 13 (ref 5–15)
AST: 29 U/L (ref 15–41)
Albumin: 4.1 g/dL (ref 3.5–5.0)
Alkaline Phosphatase: 77 U/L (ref 38–126)
BUN: 9 mg/dL (ref 6–20)
CHLORIDE: 100 mmol/L — AB (ref 101–111)
CO2: 22 mmol/L (ref 22–32)
CREATININE: 0.98 mg/dL (ref 0.61–1.24)
Calcium: 9 mg/dL (ref 8.9–10.3)
Glucose, Bld: 90 mg/dL (ref 65–99)
Potassium: 4.2 mmol/L (ref 3.5–5.1)
SODIUM: 135 mmol/L (ref 135–145)
Total Bilirubin: 0.5 mg/dL (ref 0.3–1.2)
Total Protein: 7.6 g/dL (ref 6.5–8.1)

## 2017-10-16 LAB — PROTIME-INR
INR: 0.99
PROTHROMBIN TIME: 13 s (ref 11.4–15.2)

## 2017-10-16 LAB — APTT: aPTT: 29 seconds (ref 24–36)

## 2017-10-16 MED ORDER — IOPAMIDOL (ISOVUE-370) INJECTION 76%
INTRAVENOUS | Status: AC
  Filled 2017-10-16: qty 100

## 2017-10-16 MED ORDER — GABAPENTIN 300 MG PO CAPS
300.0000 mg | ORAL_CAPSULE | Freq: Three times a day (TID) | ORAL | Status: DC
  Administered 2017-10-16 – 2017-10-17 (×3): 300 mg via ORAL
  Filled 2017-10-16 (×3): qty 1

## 2017-10-16 MED ORDER — SENNOSIDES-DOCUSATE SODIUM 8.6-50 MG PO TABS: 1.0000 | ORAL_TABLET | Freq: Every evening | ORAL | Status: DC | PRN

## 2017-10-16 MED ORDER — IOPAMIDOL (ISOVUE-370) INJECTION 76%
100.0000 mL | Freq: Once | INTRAVENOUS | Status: AC | PRN
  Administered 2017-10-16: 100 mL via INTRAVENOUS

## 2017-10-16 MED ORDER — MELOXICAM 7.5 MG PO TABS
15.0000 mg | ORAL_TABLET | Freq: Every day | ORAL | Status: DC
  Administered 2017-10-17: 15 mg via ORAL
  Filled 2017-10-16: qty 2

## 2017-10-16 MED ORDER — ACETAMINOPHEN 325 MG PO TABS
650.0000 mg | ORAL_TABLET | ORAL | Status: DC | PRN
  Administered 2017-10-16: 650 mg via ORAL
  Filled 2017-10-16: qty 2

## 2017-10-16 MED ORDER — HEPARIN SODIUM (PORCINE) 5000 UNIT/ML IJ SOLN
5000.0000 [IU] | Freq: Three times a day (TID) | INTRAMUSCULAR | Status: DC
  Administered 2017-10-16 – 2017-10-17 (×2): 5000 [IU] via SUBCUTANEOUS
  Filled 2017-10-16 (×2): qty 1

## 2017-10-16 MED ORDER — ATORVASTATIN CALCIUM 80 MG PO TABS
80.0000 mg | ORAL_TABLET | Freq: Every day | ORAL | Status: DC
  Administered 2017-10-16: 80 mg via ORAL
  Filled 2017-10-16 (×2): qty 1

## 2017-10-16 MED ORDER — ACETAMINOPHEN 160 MG/5ML PO SOLN: 650.0000 mg | ORAL | Status: DC | PRN

## 2017-10-16 MED ORDER — HYDRALAZINE HCL 20 MG/ML IJ SOLN: 5.0000 mg | Freq: Three times a day (TID) | INTRAMUSCULAR | Status: AC | PRN

## 2017-10-16 MED ORDER — ACETAMINOPHEN 650 MG RE SUPP: 650.0000 mg | RECTAL | Status: DC | PRN

## 2017-10-16 MED ORDER — ASPIRIN 300 MG RE SUPP: 300.0000 mg | Freq: Every day | RECTAL | Status: DC

## 2017-10-16 MED ORDER — PRAVASTATIN SODIUM 10 MG PO TABS: 10.0000 mg | ORAL_TABLET | Freq: Every day | ORAL | Status: DC

## 2017-10-16 MED ORDER — SODIUM CHLORIDE 0.9 % IV SOLN
INTRAVENOUS | Status: DC
  Administered 2017-10-16 – 2017-10-17 (×2): via INTRAVENOUS

## 2017-10-16 MED ORDER — ASPIRIN 325 MG PO TABS
325.0000 mg | ORAL_TABLET | Freq: Every day | ORAL | Status: DC
Start: 2017-10-16 — End: 2017-10-17
  Administered 2017-10-16 – 2017-10-17 (×2): 325 mg via ORAL
  Filled 2017-10-16 (×2): qty 1

## 2017-10-16 MED ORDER — VITAMIN B-12 100 MCG PO TABS
100.0000 ug | ORAL_TABLET | Freq: Every day | ORAL | Status: DC
  Administered 2017-10-16 – 2017-10-17 (×2): 100 ug via ORAL
  Filled 2017-10-16 (×2): qty 1

## 2017-10-16 MED ORDER — BUPROPION HCL ER (SR) 150 MG PO TB12
150.0000 mg | ORAL_TABLET | Freq: Two times a day (BID) | ORAL | Status: DC
  Administered 2017-10-16 – 2017-10-17 (×3): 150 mg via ORAL
  Filled 2017-10-16 (×5): qty 1

## 2017-10-16 MED ORDER — SERTRALINE HCL 50 MG PO TABS
25.0000 mg | ORAL_TABLET | Freq: Every day | ORAL | Status: DC
  Administered 2017-10-16 – 2017-10-17 (×2): 25 mg via ORAL
  Filled 2017-10-16 (×2): qty 1

## 2017-10-16 MED ORDER — DONEPEZIL HCL 10 MG PO TABS
10.0000 mg | ORAL_TABLET | Freq: Every day | ORAL | Status: DC
  Administered 2017-10-16: 10 mg via ORAL
  Filled 2017-10-16 (×2): qty 1

## 2017-10-16 NOTE — ED Notes (Signed)
Patient transported to MRI 

## 2017-10-16 NOTE — Code Documentation (Signed)
77yo [facility] at 1116.  LKW at 1000 when patient developed left sided sensory deficit.  EMS activated a code stroke. Stroke team at the bedside on patient arrival. Labs drawn and patient cleared for CT by Dr. Particia NearingHaviland.  Patient to CT with team. CT completed followed by CTA.  NIHSS 2, see documentation for details and code stroke times.  Patient with left sided decreased sensation and mild dysarthria on exam. No acute stroke treatment at this time.  Patient remains in the tPA window until 1430 should symptoms worsen.  Handoff with ED RN Baird Lyonsasey.

## 2017-10-16 NOTE — Progress Notes (Signed)
Skin assessment done by this nurse, no pressure ulcer noted. Patient alert and oriented x 4

## 2017-10-16 NOTE — H&P (Signed)
History and Physical    Roberto Wallace ZOX:[id number] DOB: 02-13-1941 DOA: 10/16/2017   PCP: Veryl Speak, FNP   Patient coming from:  Home    Chief Complaint: Strokelike symptoms  HPI: Roberto Tinoco. is a 77 y.o. male with medical history significant for HTN, HLD, PTSD, remote stiffness, history of migraines, cognitive impairment (mild dementia), as well as a prior history of TIAs "too many to count over the last 27 years" brought to the emergency department by EMS, after experiencing dizziness, without syncope or presyncope, taking his blood pressure and reporting this was very high, accompanied by subjective decreased sensation in the left arm and leg, without any other focal neurological deficits.  The symptoms occurred around 10 AM.  The patient does have a history of mild dysarthria since previous TIA "many years ago ", but denies any worsening symptoms, or dysphagia.  No headaches, nausea or vomiting.  No new vision changes.  He denies any fever, chills or night sweats.  He denies any chest pain, shortness of breath or palpitations.  He denies any cough, or recent infections. The patient denies any abdominal pain.  The patient lives alone, and ambulates with a cane.  He reports not being very compliant with his medications, "I forget to take it at times ".  He still able to drive with GPS.  He smokes about 5 cigarettes a day.  He denies any significant amount of alcohol intake, or recreational drug use.   ED Course:  BP 130/85   Pulse 83   Temp 98.3 F (36.8 C) (Oral)   Resp 18   Ht 5\' 9"  (1.753 m)   Wt 53.8 kg (118 lb 11.2 oz)   SpO2 99%   BMI 17.53 kg/m   No TPA was given, as he has low NIH stroke scale of 2 Rankin scale is 1 CT Angio of the head and neck did not show any large vessel occlusion,  there is no candidacy for an endovascular thrombectomy Lipid panel, and A1c are pending. CBC, complete metabolic panel are normal.  Review of Systems:  As per HPI  otherwise all other systems reviewed and are negative  Past Medical History:  Diagnosis Date  . Arthritis   . Chicken pox   . Depression   . Hypercholesteremia   . Hypertension   . Migraine   . PTSD (post-traumatic stress disorder) 06/24/2017  . Stroke (HCC)    No residual effects  . Syphilis   . Tuberculosis     Past Surgical History:  Procedure Laterality Date  . BACK SURGERY    . HERNIA REPAIR      Social History Social History   Socioeconomic History  . Marital status: Widowed    Spouse name: Not on file  . Number of children: 2  . Years of education: 21  . Highest education level: Not on file  Social Needs  . Financial resource strain: Not on file  . Food insecurity - worry: Not on file  . Food insecurity - inability: Not on file  . Transportation needs - medical: Not on file  . Transportation needs - non-medical: Not on file  Occupational History  . Occupation: Retired  Tobacco Use  . Smoking status: Current Every Day Smoker    Packs/day: 0.50    Years: 50.00    Pack years: 25.00    Types: Cigarettes  . Smokeless tobacco: Never Used  Substance and Sexual Activity  . Alcohol use: Yes  Alcohol/week: 1.2 - 1.8 oz    Types: 2 - 3 Shots of liquor per week    Comment: social   . Drug use: No  . Sexual activity: No  Other Topics Concern  . Not on file  Social History Narrative   Fun: Sit on the back porch and relax.   Denies religious beliefs effecting health care.    Lives alone in a one story home.  Has 2 biological children and 3 adopted children.   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     No Known Allergies  Family History  Problem Relation Age of Onset  . Healthy Mother   . Healthy Father       Prior to Admission medications   Medication Sig Start Date End Date Taking? Authorizing Provider  bisacodyl (DULCOLAX) 10 MG suppository Place 1 suppository (10 mg total) rectally as needed for moderate constipation. 06/24/17   Corwin Levins, MD  buPROPion (ZYBAN) 150 MG 12 hr tablet Take 1 tablet by mouth daily for 3 days then 1 tablet by mouth twice daily. 06/24/17   Corwin Levins, MD  donepezil (ARICEPT) 10 MG tablet Take 1 tablet (10 mg total) by mouth at bedtime. 06/24/17   Corwin Levins, MD  gabapentin (NEURONTIN) 300 MG capsule Take 1 capsule (300 mg total) by mouth 3 (three) times daily. Must keep appt w/new provider for future refills 06/24/17   Corwin Levins, MD  hydrochlorothiazide (HYDRODIURIL) 12.5 MG tablet Take 1 tablet (12.5 mg total) by mouth daily. Must keep appt w/new provider for future refills 06/24/17   Corwin Levins, MD  meloxicam [facility]) 15 MG tablet TAKE ONE TABLET BY MOUTH  DAILY 06/24/17   Corwin Levins, MD  Multiple Vitamins-Minerals (CENTRUM SILVER ADULT 50+) TABS Take 1 capsule by mouth daily. 03/27/15   Veryl Speak, FNP  nicotine (NICODERM CQ) 21 mg/24hr patch Place 1 patch (21 mg total) onto the skin daily. 03/27/15   Veryl Speak, FNP  phenylephrine (SUDAFED PE) 10 MG TABS tablet Take 10 mg by mouth every 4 (four) hours as needed (pain).     [provider]  pravastatin (PRAVACHOL) 10 MG tablet Take 1 tablet (10 mg total) by mouth daily. Must keep appt w/new provider for future refills 06/24/17   Corwin Levins, MD  sertraline (ZOLOFT) 25 MG tablet Take 1 tablet (25 mg total) by mouth daily. Must keep appt w/new provider for future refills 06/24/17   Corwin Levins, MD  vitamin B-12 (CYANOCOBALAMIN) 100 MCG tablet Take 100 mcg by mouth daily.    [provider]    Physical Exam:  Vitals:   10/16/17 1245 10/16/17 1300 10/16/17 1315 10/16/17 1330  BP: (!) 137/94 (!) 146/93 129/83 130/85  Pulse: 96 89 85 83  Resp: 20 14 16 18   Temp:      TempSrc:      SpO2: 100% 100% 99% 99%  Weight:      Height:       Constitutional: NAD, calm, comfortable Eyes: PERRL, lids and conjunctivae normal ENMT: Mucous membranes are moist, without exudate or lesions  Neck: normal, supple,  no masses, no thyromegaly Respiratory: clear to auscultation bilaterally, no wheezing, no crackles. Normal respiratory effort  Cardiovascular: Regular rate and rhythm,  murmur, rubs or gallops. No extremity edema. 2+ pedal pulses. No carotid bruits.  Abdomen: Soft, non tender, No hepatosplenomegaly. Bowel sounds positive.  Musculoskeletal: no clubbing / cyanosis. Moves all  extremities Skin: no jaundice, No lesions.  Neurologic: Sensation intact at this time. HAs known mild dementia  Psychiatric:   Alert and oriented x 3. Normal mood.     Labs on Admission: I have personally reviewed following labs and imaging studies  CBC: Recent Labs  Lab 10/16/17 1115 10/16/17 1124  WBC 7.4  --   NEUTROABS 5.0  --   HGB 14.8 16.0  HCT 44.4 47.0  MCV 91.4  --   PLT 192  --     Basic Metabolic Panel: Recent Labs  Lab 10/16/17 1115 10/16/17 1124  NA 135 139  K 4.2 4.2  CL 100* 102  CO2 22  --   GLUCOSE 90 89  BUN 9 10  CREATININE 0.98 0.90  CALCIUM 9.0  --     GFR: Estimated Creatinine Clearance: 54.8 mL/min (by C-G formula based on SCr of 0.9 mg/dL).  Liver Function Tests: Recent Labs  Lab 10/16/17 1115  AST 29  ALT 16*  ALKPHOS 77  BILITOT 0.5  PROT 7.6  ALBUMIN 4.1   No results for input(s): LIPASE, AMYLASE in the last 168 hours. No results for input(s): AMMONIA in the last 168 hours.  Coagulation Profile: Recent Labs  Lab 10/16/17 1115  INR 0.99    Cardiac Enzymes: No results for input(s): CKTOTAL, CKMB, CKMBINDEX, TROPONINI in the last 168 hours.  BNP (last 3 results) No results for input(s): PROBNP in the last 8760 hours.  HbA1C: No results for input(s): HGBA1C in the last 72 hours.  CBG: No results for input(s): GLUCAP in the last 168 hours.  Lipid Profile: No results for input(s): CHOL, HDL, LDLCALC, TRIG, CHOLHDL, LDLDIRECT in the last 72 hours.  Thyroid Function Tests: No results for input(s): TSH, T4TOTAL, FREET4, T3FREE, THYROIDAB in the last  72 hours.  Anemia Panel: No results for input(s): VITAMINB12, FOLATE, FERRITIN, TIBC, IRON, RETICCTPCT in the last 72 hours.  Urine analysis:    Component Value Date/Time   COLORURINE YELLOW 11/26/2016 1537   APPEARANCEUR CLEAR 11/26/2016 1537   LABSPEC 1.017 11/26/2016 1537   PHURINE 7.0 11/26/2016 1537   GLUCOSEU NEGATIVE 11/26/2016 1537   HGBUR NEGATIVE 11/26/2016 1537   BILIRUBINUR NEGATIVE 11/26/2016 1537   KETONESUR NEGATIVE 11/26/2016 1537   PROTEINUR 30 (A) 11/26/2016 1537   UROBILINOGEN 0.2 01/09/2015 1340   NITRITE NEGATIVE 11/26/2016 1537   LEUKOCYTESUR NEGATIVE 11/26/2016 1537    Sepsis Labs: @LABRCNTIP (procalcitonin:4,lacticidven:4) )No results found for this or any previous visit (from the past 240 hour(s)).   Radiological Exams on Admission: Ct Angio Head W Or Wo Contrast  Result Date: 10/16/2017 CLINICAL DATA:  Code stroke evaluation. Left arm numbness. Last seen normal 2 hours ago. Negative head CT. EXAM: CT ANGIOGRAPHY HEAD AND NECK TECHNIQUE: Multidetector CT imaging of the head and neck was performed using the standard protocol during bolus administration of intravenous contrast. Multiplanar CT image reconstructions and MIPs were obtained to evaluate the vascular anatomy. Carotid stenosis measurements (when applicable) are obtained utilizing NASCET criteria, using the distal internal carotid diameter as the denominator. CONTRAST:  100mL ISOVUE-370 IOPAMIDOL (ISOVUE-370) INJECTION 76% COMPARISON:  Head CT earlier same day FINDINGS: CTA NECK FINDINGS Aortic arch: Aortic atherosclerosis. No aneurysm or dissection. 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 Right carotid system: Common carotid artery widely patent to the bifurcation region. There is atherosclerotic plaque at the carotid bifurcation but no stenosis or irregularity. Cervical ICA is widely patent. Left carotid system: Common carotid artery widely patent  to the bifurcation region. Mild  soft plaque at the bifurcation but no stenosis or irregularity. Vertebral arteries: Both vertebral artery origins are widely patent. Both vertebral arteries are widely patent through the cervical region. As noted above, left vertebral artery arises directly from the arch and is a small vessel. Skeleton: Degenerative spondylosis.  No acute bone finding. Other neck: No mass or lymphadenopathy. Upper chest: Scarring and emphysematous changes.  No acute finding. Review of the MIP images confirms the above findings CTA HEAD FINDINGS Anterior circulation: Both internal carotid arteries are patent through the skull base and siphon regions. Peripheral atherosclerotic calcification in the carotid siphon regions but no stenosis greater than about 20%. Anterior and middle cerebral vessels are patent without proximal stenosis, aneurysm or vascular malformation. Posterior circulation: Both vertebral arteries are patent through the foramen magnum with the right being larger than the left. Left vertebral artery primarily ends in PICA, with a tiny contribution to the basilar. No basilar stenosis. Posterior circulation branch vessels are patent. PCAs receive primary supply from the anterior circulation. Venous sinuses: Patent and normal. Anatomic variants: None significant Delayed phase: No abnormal enhancement Review of the MIP images confirms the above findings IMPRESSION: No acute finding.  No emergent large vessel occlusion. Mild atherosclerotic disease at the carotid bifurcations but without stenosis or irregularity. 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 Mild atherosclerotic change at the origin but without stenosis or occlusion. Ordinary atherosclerotic change in the carotid siphon regions without evidence of flow limiting stenosis. No intracranial large or medium vessel abnormality seen. Electronically Signed   By: Paulina Fusi M.D.   On: 10/16/2017 12:11   Ct Angio Neck W Or Wo Contrast  Result  Date: 10/16/2017 CLINICAL DATA:  Code stroke evaluation. Left arm numbness. Last seen normal 2 hours ago. Negative head CT. EXAM: CT ANGIOGRAPHY HEAD AND NECK TECHNIQUE: Multidetector CT imaging of the head and neck was performed using the standard protocol during bolus administration of intravenous contrast. Multiplanar CT image reconstructions and MIPs were obtained to evaluate the vascular anatomy. Carotid stenosis measurements (when applicable) are obtained utilizing NASCET criteria, using the distal internal carotid diameter as the denominator. CONTRAST:  ISOVUE-370 IOPAMIDOL (ISOVUE-370) INJECTION 76% COMPARISON:  Head CT earlier same day FINDINGS: CTA NECK FINDINGS Aortic arch: Aortic atherosclerosis. No aneurysm or dissection. 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 Right carotid system: Common carotid artery widely patent to the bifurcation region. There is atherosclerotic plaque at the carotid bifurcation but no stenosis or irregularity. Cervical ICA is widely patent. Left carotid system: Common carotid artery widely patent to the bifurcation region. Mild soft plaque at the bifurcation but no stenosis or irregularity. Vertebral arteries: Both vertebral artery origins are widely patent. Both vertebral arteries are widely patent through the cervical region. As noted above, left vertebral artery arises directly from the arch and is a small vessel. Skeleton: Degenerative spondylosis.  No acute bone finding. Other neck: No mass or lymphadenopathy. Upper chest: Scarring and emphysematous changes.  No acute finding. Review of the MIP images confirms the above findings CTA HEAD FINDINGS Anterior circulation: Both internal carotid arteries are patent through the skull base and siphon regions. Peripheral atherosclerotic calcification in the carotid siphon regions but no stenosis greater than about 20%. Anterior and middle cerebral vessels are patent without proximal stenosis, aneurysm or  vascular malformation. Posterior circulation: Both vertebral arteries are patent through the foramen magnum with the right being larger than the left. Left  vertebral artery primarily ends in PICA, with a tiny contribution to the basilar. No basilar stenosis. Posterior circulation branch vessels are patent. PCAs receive primary supply from the anterior circulation. Venous sinuses: Patent and normal. Anatomic variants: None significant Delayed phase: No abnormal enhancement Review of the MIP images confirms the above findings IMPRESSION: No acute finding.  No emergent large vessel occlusion. Mild atherosclerotic disease at the carotid bifurcations but without stenosis or irregularity. 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 Mild atherosclerotic change at the origin but without stenosis or occlusion. Ordinary atherosclerotic change in the carotid siphon regions without evidence of flow limiting stenosis. No intracranial large or medium vessel abnormality seen. Electronically Signed   By: Paulina Fusi M.D.   On: 10/16/2017 12:11   Ct Head Code Stroke Wo Contrast  Result Date: 10/16/2017 CLINICAL DATA:  Code stroke. Left arm numbness. Last seen normal 10 a.m. EXAM: CT HEAD WITHOUT CONTRAST TECHNIQUE: Contiguous axial images were obtained from the base of the skull through the vertex without intravenous contrast. COMPARISON:  11/20/2016 FINDINGS: Brain: No evidence of acute infarction, hemorrhage, hydrocephalus, extra-axial collection or mass lesion/mass effect. Vascular: No hyperdense vessel or unexpected calcification. Skull: Normal. Negative for fracture or focal lesion. Sinuses/Orbits: Negative Other: These results were communicated to Dr. Wilford Corner at 11:33 amon 2/22/2019by text page via the [facility] messaging system. ASPECTS [facility] Stroke Program Early CT Score) - Ganglionic level infarction (caudate, lentiform nuclei, internal capsule, insula, M1-M3 cortex): 7 - Supraganglionic infarction  (M4-M6 cortex): 3 Total score (0-10 with 10 being normal): 10 IMPRESSION: No acute finding.  ASPECTS is 10. Electronically Signed   By: Marnee Spring M.D.   On: 10/16/2017 11:34    EKG: Independently reviewed.  Assessment/Plan Principal Problem:   Stroke-like symptoms Active Problems:   TOBACCO USER   Anxiety and depression   HYPERTENSION, BENIGN ESSENTIAL   History of cardiovascular disorder   Neuropathy   Decreased mobility   Amnestic MCI (mild cognitive impairment with memory loss)   Hyperlipidemia   Depression   Mild dementia   Strokelike symptoms in a patient with history of TIA, not on daily aspirin in the setting of mild dementia. No TPA was given, as he has low NIH stroke scale of 2. Rankin scale is 1. CT Angio of the head and neck did not show any large vessel occlusion,  there is no candidacy for an endovascular thrombectomy. Lipid panel, and A1c are pending. CBC, complete metabolic panel are normal.   EKG within limits of normal.Deficits have resolved  Telemetry observation Obtain 2 D Echo  A1C Lipid panel  Aspirin and high-dose statin,  PT/ OT/ speech consult Bedside swallow eval and if passes heart healthy diet Smoking cessation Allow permissive hypertension, add hydralazine for 210/110 for the next 24-48 hrs as indicated by Neurology    Hypertension Controlled.BP 130/85   Pulse 83  Allow permissive HTN May use hydralazine for greater than 210/110 diastolic for the next 24-48 hrs as indicated by Neurology   Hyperlipidemia Continue home statins  Chronic neuropathy Continue Neurontin and B12  History of Dementia, on Aricept . No acute issues Continue Aricept Care manager and social work consult, to help patient with home health nursing, especially helping him with meds administration, due to dementia  Depression Continue home Zoloft and Zyban    DVT prophylaxis:  Heparin sq  Code Status:    Full l  Family Communication:  Discussed with  patient Disposition Plan: Expect patient to be discharged  to home after condition improves Consults called:    Neurology, Dr. Wilford Corner Admission status:  Tele Obs   Marlowe Kays, PA-C [facility]   Amion text  [phone]   10/16/2017, 1:41 PM

## 2017-10-16 NOTE — Progress Notes (Signed)
[facility]. For further assistance with this matter please speak with RNCM for assistance with mediation needs and [facility]. CSW signing off. If new need arises please re consult.     Claude MangesKierra S. Kareen Jefferys, MSW, LCSW-A Emergency Department Clinical Social Worker [phone]

## 2017-10-16 NOTE — ED Triage Notes (Signed)
Per EMS: Pt LKW was 1000, pt reports L arm tingle and numbness. Pt has Hx of stroke but Pt has Dementia and cant remember if these symptoms were like his last stroke.  Symptoms resolved in Ambulance. At the bridge Pt had twitching of R arm and face. Pt appears to be anxious and twitching resolves when no one around him. Pt comes from home and called EMS himself. A&Ox4

## 2017-10-16 NOTE — ED Provider Notes (Signed)
[facility] EMERGENCY DEPARTMENT Provider Note   CSN: [id number] Arrival date & time: 10/16/17  1116   An emergency department physician performed an initial assessment on this suspected stroke patient at 1120.  History   Chief Complaint Chief Complaint  Patient presents with  . Code Stroke    HPI Estefano Victory. is a 77 y.o. male.  Pt presents to the ED today with left arm tingling and numbness.  The pt was last seen normal at 1000.  The pt has a hx of a prior stroke, but does not recall when the previous stroke occurred.  Code stroke was called by EMS and stroke team met him [place] the bridge.      Past Medical History:  Diagnosis Date  . Arthritis   . Chicken pox   . Depression   . Hypercholesteremia   . Hypertension   . Migraine   . PTSD (post-traumatic stress disorder) 06/24/2017  . Stroke ([place])    No residual effects  . Syphilis   . Tuberculosis     Patient Active Problem List   Diagnosis Date Noted  . Hyperlipidemia 10/16/2017  . Depression 10/16/2017  . Lateral epicondylitis, right elbow 06/26/2017  . PTSD (post-traumatic stress disorder) 06/24/2017  . Encounter for general adult medical examination with abnormal findings 04/28/2017  . Right wrist pain 06/09/2016  . Osteoarthritis 02/05/2016  . Tuberculosis 02/05/2016  . Loss of weight 10/31/2015  . 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 Neuropathy 10/03/2015  . Intermittent memory loss 10/03/2015  . Decreased mobility 10/03/2015  . Routine general medical examination at a health care facility 04/16/2015  . Medicare annual wellness visit, subsequent 04/16/2015  . TOBACCO USER 06/14/2010  . Anxiety and depression 11/19/2009  . HYPERTENSION, BENIGN ESSENTIAL 11/19/2009  . Other specified cardiac dysrhythmias(427.89) 11/19/2009  . GEN OSTEOARTHROSIS INVOLVING MULTIPLE SITES 11/19/2009  . History of cardiovascular disorder 11/19/2009    Past  Surgical History:  Procedure Laterality Date  . BACK SURGERY    . HERNIA REPAIR         Home Medications    Prior to Admission medications   Medication Sig Start Date End Date Taking? Authorizing Provider  bisacodyl (DULCOLAX) 10 MG suppository Place 1 suppository (10 mg total) rectally as needed for moderate constipation. 06/24/17  Yes [name], MD  buPROPion (ZYBAN) 150 MG 12 hr tablet Take 1 tablet by mouth daily for 3 days then 1 tablet by mouth twice daily. 06/24/17  Yes [name], MD  donepezil (ARICEPT) 10 MG tablet Take 1 tablet (10 mg total) by mouth at bedtime. 06/24/17  Yes [name], MD  gabapentin (NEURONTIN) 300 MG capsule Take 1 capsule (300 mg total) by mouth 3 (three) times daily. Must keep appt w/new provider for future refills 06/24/17  Yes [name], MD  hydrochlorothiazide (HYDRODIURIL) 12.5 MG tablet Take 1 tablet (12.5 mg total) by mouth daily. Must keep appt w/new provider for future refills 06/24/17  Yes [name], MD  meloxicam [facility]) 15 MG tablet TAKE ONE TABLET BY MOUTH  DAILY 06/24/17  Yes [name], MD  phenylephrine (SUDAFED PE) 10 MG TABS tablet Take 10 mg by mouth every 4 (four) hours as needed (pain).    Yes [provider]  pravastatin (PRAVACHOL) 10 MG tablet Take 1 tablet (10 mg total) by mouth daily. Must keep appt w/new provider for future refills 06/24/17  Yes [name],  MD  sertraline (ZOLOFT) 25 MG tablet Take 1 tablet (25 mg total) by mouth daily. Must keep appt w/new provider for future refills 06/24/17  Yes [name], MD  vitamin B-12 (CYANOCOBALAMIN) 100 MCG tablet Take 100 mcg by mouth daily.   Yes [provider]  Multiple Vitamins-Minerals (CENTRUM SILVER ADULT 50+) TABS Take 1 capsule by mouth daily. Patient not taking: Reported on 10/16/2017 03/27/15   Golden Circle, FNP  nicotine (NICODERM CQ) 21 mg/24hr patch Place 1 patch (21 mg total) onto the skin daily. Patient not taking: Reported  on 10/16/2017 03/27/15   Golden Circle, FNP    Family History Family History  Problem Relation Age of Onset  . Healthy Mother   . Healthy Father     Social History Social History   Tobacco Use  . Smoking status: Current Every Day Smoker    Packs/day: 0.50    Years: 50.00    Pack years: 25.00    Types: Cigarettes  . Smokeless tobacco: Never Used  Substance Use Topics  . Alcohol use: Yes    Alcohol/week: 1.2 - 1.8 oz    Types: 2 - 3 Shots of liquor per week    Comment: social   . Drug use: No     Allergies   Patient has no known allergies.   Review of Systems Review of Systems  Neurological: Positive for tremors.       Numbness to left arm  All other systems reviewed and are negative.    Physical Exam Updated Vital Signs BP 123/84   Pulse 85   Temp 98.3 F (36.8 C) (Oral)   Resp 17   Ht '5\' 9"'$  (1.753 m)   Wt 53.8 kg (118 lb 11.2 oz)   SpO2 100%   BMI 17.53 kg/m   Physical Exam  Constitutional: He is oriented to person, place, and time. He appears well-developed and well-nourished.  HENT:  Head: Normocephalic and atraumatic.  Right Ear: External ear normal.  Left Ear: External ear normal.  Nose: Nose normal.  Mouth/Throat: Oropharynx is clear and moist.  Eyes: Conjunctivae and EOM are normal. Pupils are equal, round, and reactive to light.  Neck: Normal range of motion. Neck supple.  Cardiovascular: Normal rate, regular rhythm, normal heart sounds and intact distal pulses.  Pulmonary/Chest: Effort normal and breath sounds normal.  Abdominal: Soft. Bowel sounds are normal.  Musculoskeletal: Normal range of motion.  Neurological: He is alert and oriented to person, place, and time.  Occasional facial tremor and right arm tremor associated with anxiety.  Skin: Skin is warm. Capillary refill takes less than 2 seconds.  Psychiatric: He has a normal mood and affect. His behavior is normal. Judgment and thought content normal.  Nursing note and vitals  reviewed.    ED Treatments / Results  Labs (all labs ordered are listed, but only abnormal results are displayed) Labs Reviewed  COMPREHENSIVE METABOLIC PANEL - Abnormal; Notable for the following components:      Result Value   Chloride 100 (*)    ALT 16 (*)    All other components within normal limits  I-STAT CHEM 8, ED - Abnormal; Notable for the following components:   Calcium, Ion 1.07 (*)    All other components within normal limits  PROTIME-INR  APTT  CBC  DIFFERENTIAL  I-STAT TROPONIN, ED  CBG MONITORING, ED    EKG  EKG Interpretation  Date/Time:  Friday October 16 2017 11:52:12 EST Ventricular Rate:  96  PR Interval:    QRS Duration: 92 QT Interval:  356 QTC Calculation: 450 R Axis:   89 Text Interpretation:  Sinus rhythm Borderline right axis deviation LVH by voltage Confirmed by Isla Pence [phone]) on 10/16/2017 12:24:07 PM       Radiology Ct Angio Head W Or Wo Contrast  Result Date: 10/16/2017 CLINICAL DATA:  Code stroke evaluation. Left arm numbness. Last seen normal 2 hours ago. Negative head CT. EXAM: CT ANGIOGRAPHY HEAD AND NECK TECHNIQUE: Multidetector CT imaging of the head and neck was performed using the standard protocol during bolus administration of intravenous contrast. Multiplanar CT image reconstructions and MIPs were obtained to evaluate the vascular anatomy. Carotid stenosis measurements (when applicable) are obtained utilizing NASCET criteria, using the distal internal carotid diameter as the denominator. CONTRAST:  13m ISOVUE-370 IOPAMIDOL (ISOVUE-370) INJECTION 76% COMPARISON:  Head CT earlier same day FINDINGS: CTA NECK FINDINGS Aortic arch: Aortic atherosclerosis. No aneurysm or dissection. 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 Right carotid system: Common carotid artery widely patent to the bifurcation region. There is atherosclerotic plaque at the carotid bifurcation but no stenosis or irregularity. Cervical  ICA is widely patent. Left carotid system: Common carotid artery widely patent to the bifurcation region. Mild soft plaque at the bifurcation but no stenosis or irregularity. Vertebral arteries: Both vertebral artery origins are widely patent. Both vertebral arteries are widely patent through the cervical region. As noted above, left vertebral artery arises directly from the arch and is a small vessel. Skeleton: Degenerative spondylosis.  No acute bone finding. Other neck: No mass or lymphadenopathy. Upper chest: Scarring and emphysematous changes.  No acute finding. Review of the MIP images confirms the above findings CTA HEAD FINDINGS Anterior circulation: Both internal carotid arteries are patent through the skull base and siphon regions. Peripheral atherosclerotic calcification in the carotid siphon regions but no stenosis greater than about 20%. Anterior and middle cerebral vessels are patent without proximal stenosis, aneurysm or vascular malformation. Posterior circulation: Both vertebral arteries are patent through the foramen magnum with the right being larger than the left. Left vertebral artery primarily ends in PICA, with a tiny contribution to the basilar. No basilar stenosis. Posterior circulation branch vessels are patent. PCAs receive primary supply from the anterior circulation. Venous sinuses: Patent and normal. Anatomic variants: None significant Delayed phase: No abnormal enhancement Review of the MIP images confirms the above findings IMPRESSION: No acute finding.  No emergent large vessel occlusion. Mild atherosclerotic disease at the carotid bifurcations but without stenosis or irregularity. 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 Mild atherosclerotic change at the origin but without stenosis or occlusion. Ordinary atherosclerotic change in the carotid siphon regions without evidence of flow limiting stenosis. No intracranial large or medium vessel abnormality seen.  Electronically Signed   By: MNelson ChimesM.D.   On: 10/16/2017 12:11   Ct Angio Neck W Or Wo Contrast  Result Date: 10/16/2017 CLINICAL DATA:  Code stroke evaluation. Left arm numbness. Last seen normal 2 hours ago. Negative head CT. EXAM: CT ANGIOGRAPHY HEAD AND NECK TECHNIQUE: Multidetector CT imaging of the head and neck was performed using the standard protocol during bolus administration of intravenous contrast. Multiplanar CT image reconstructions and MIPs were obtained to evaluate the vascular anatomy. Carotid stenosis measurements (when applicable) are obtained utilizing NASCET criteria, using the distal internal carotid diameter as the denominator. CONTRAST:  1080mISOVUE-370 IOPAMIDOL (ISOVUE-370) INJECTION 76% COMPARISON:  Head CT earlier same day  FINDINGS: CTA NECK FINDINGS Aortic arch: Aortic atherosclerosis. No aneurysm or dissection. 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 Right carotid system: Common carotid artery widely patent to the bifurcation region. There is atherosclerotic plaque at the carotid bifurcation but no stenosis or irregularity. Cervical ICA is widely patent. Left carotid system: Common carotid artery widely patent to the bifurcation region. Mild soft plaque at the bifurcation but no stenosis or irregularity. Vertebral arteries: Both vertebral artery origins are widely patent. Both vertebral arteries are widely patent through the cervical region. As noted above, left vertebral artery arises directly from the arch and is a small vessel. Skeleton: Degenerative spondylosis.  No acute bone finding. Other neck: No mass or lymphadenopathy. Upper chest: Scarring and emphysematous changes.  No acute finding. Review of the MIP images confirms the above findings CTA HEAD FINDINGS Anterior circulation: Both internal carotid arteries are patent through the skull base and siphon regions. Peripheral atherosclerotic calcification in the carotid siphon regions but no  stenosis greater than about 20%. Anterior and middle cerebral vessels are patent without proximal stenosis, aneurysm or vascular malformation. Posterior circulation: Both vertebral arteries are patent through the foramen magnum with the right being larger than the left. Left vertebral artery primarily ends in PICA, with a tiny contribution to the basilar. No basilar stenosis. Posterior circulation branch vessels are patent. PCAs receive primary supply from the anterior circulation. Venous sinuses: Patent and normal. Anatomic variants: None significant Delayed phase: No abnormal enhancement Review of the MIP images confirms the above findings IMPRESSION: No acute finding.  No emergent large vessel occlusion. Mild atherosclerotic disease at the carotid bifurcations but without stenosis or irregularity. 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 Mild atherosclerotic change at the origin but without stenosis or occlusion. Ordinary atherosclerotic change in the carotid siphon regions without evidence of flow limiting stenosis. No intracranial large or medium vessel abnormality seen. Electronically Signed   By: Nelson Chimes M.D.   On: 10/16/2017 12:11   Ct Head Code Stroke Wo Contrast  Result Date: 10/16/2017 CLINICAL DATA:  Code stroke. Left arm numbness. Last seen normal 10 a.m. EXAM: CT HEAD WITHOUT CONTRAST TECHNIQUE: Contiguous axial images were obtained from the base of the skull through the vertex without intravenous contrast. COMPARISON:  11/20/2016 FINDINGS: Brain: No evidence of acute infarction, hemorrhage, hydrocephalus, extra-axial collection or mass lesion/mass effect. Vascular: No hyperdense vessel or unexpected calcification. Skull: Normal. Negative for fracture or focal lesion. Sinuses/Orbits: Negative Other: These results were communicated to Dr. Rory Percy at 11:33 amon 2/22/2019by text page via the [facility] messaging system. ASPECTS [facility] Stroke Program Early CT Score) - Ganglionic  level infarction (caudate, lentiform nuclei, internal capsule, insula, M1-M3 cortex): 7 - Supraganglionic infarction (M4-M6 cortex): 3 Total score (0-10 with 10 being normal): 10 IMPRESSION: No acute finding.  ASPECTS is 10. Electronically Signed   By: Monte Fantasia M.D.   On: 10/16/2017 11:34    Procedures Procedures (including critical care time)  Medications Ordered in ED Medications  iopamidol (ISOVUE-370) 76 % injection (not administered)  iopamidol (ISOVUE-370) 76 % injection 100 mL (100 mLs Intravenous Contrast Given 10/16/17 1133)     Initial Impression / Assessment and Plan / ED Course  I have reviewed the triage vital signs and the nursing notes.  Pertinent labs & imaging results that were available during my care of the patient were reviewed by me and considered in my medical decision making (see chart for details).   Dr. Rory Percy (neurology)  did see pt.  He does not meet criteria for tPA.  Dr. Rory Percy recommended admission for stroke work up.  Pt d/w hospitalists for admission.   Final Clinical Impressions(s) / ED Diagnoses   Final diagnoses:  TIA (transient ischemic attack)    ED Discharge Orders    None       Isla Pence, MD 10/16/17 1250

## 2017-10-16 NOTE — Progress Notes (Addendum)
Neurology Consultation  Reason for Consult: code stroke Referring Physician: Dr Particia Nearing  CC: lt sided tingling and numbness   History is obtained from:  HPI: Roberto Wallace. is a 77 y.o. male who has a past medical history of hypertension, PTSD, syphilis, migraines, cognitive impairment, who was in his usual state of health when he woke up this morning and noted around 10 AM that he had some tingling and numbness on his left arm and leg. He was unable to shake that feeling off and called EMS as he had concerns for stroke due to a past history of strokes. He was brought into the emergency room as an acute code stroke for focal neurological symptoms. On the way to the emergency room, according to EMS, his symptoms have subsided. Upon my initial evaluation, he complained of some subjective decreased sensation on the left arm and leg. No other focal neurological deficits with a NIH of 2 (1 for sensory and may be 1 for dysarthria) Denies any preceding fevers or chills.  Denies visual symptoms.  Denies headaches. Denies chest pain, shortness of breath palpitations.  Denies abdominal pain.  Denies nausea vomiting.  Denies dizziness lightheadedness  He has been seen by outpatient neurology-Dr. Everlena Cooper for cognitive impairment.  He continues to live alone.  He is able to take care of his finances with the help of auto payments, mobile applications and is still driving although he does not drive long distances and uses GPS for directions.  No formal neuropsych testing has been done but he has been started on Aricept and it has been increased to 10 mg daily.  LKW: 10 AM on 10/16/2017 tpa given?: no, low NIH stroke scale Premorbid modified Rankin scale (mRS): 1   ROS: Review of systems performed with pertinent positives documented in the HPI.  Rest of the review was negative.  Past Medical History:  Diagnosis Date  . Arthritis   . Chicken pox   . Depression   . Hypercholesteremia   .  Hypertension   . Migraine   . PTSD (post-traumatic stress disorder) 06/24/2017  . Stroke (HCC)    No residual effects  . Syphilis   . Tuberculosis     Family History  Problem Relation Age of Onset  . Healthy Mother   . Healthy Father     Social History:   reports that he has been smoking cigarettes.  He has a 25.00 pack-year smoking history. he has never used smokeless tobacco. He reports that he drinks about 1.2 - 1.8 oz of alcohol per week. He reports that he does not use drugs. Denies drug use Smokes half pack of cigarettes a day Reports alcohol use occasional  Medications  Current Facility-Administered Medications:  .  iopamidol (ISOVUE-370) 76 % injection, , , ,   Current Outpatient Medications:  .  bisacodyl (DULCOLAX) 10 MG suppository, Place 1 suppository (10 mg total) rectally as needed for moderate constipation., Disp: 12 suppository, Rfl: 2 .  buPROPion (ZYBAN) 150 MG 12 hr tablet, Take 1 tablet by mouth daily for 3 days then 1 tablet by mouth twice daily., Disp: 180 tablet, Rfl: 3 .  donepezil (ARICEPT) 10 MG tablet, Take 1 tablet (10 mg total) by mouth at bedtime., Disp: 90 tablet, Rfl: 3 .  gabapentin (NEURONTIN) 300 MG capsule, Take 1 capsule (300 mg total) by mouth 3 (three) times daily. Must keep appt w/new provider for future refills, Disp: 270 capsule, Rfl: 1 .  hydrochlorothiazide (HYDRODIURIL) 12.5 MG tablet,  Take 1 tablet (12.5 mg total) by mouth daily. Must keep appt w/new provider for future refills, Disp: 90 tablet, Rfl: 3 .  meloxicam (MOBIC) 15 MG tablet, TAKE ONE TABLET BY MOUTH  DAILY, Disp: 90 tablet, Rfl: 3 .  Multiple Vitamins-Minerals (CENTRUM SILVER ADULT 50+) TABS, Take 1 capsule by mouth daily., Disp: 100 tablet, Rfl: 5 .  nicotine (NICODERM CQ) 21 mg/24hr patch, Place 1 patch (21 mg total) onto the skin daily., Disp: 28 patch, Rfl: 0 .  phenylephrine (SUDAFED PE) 10 MG TABS tablet, Take 10 mg by mouth every 4 (four) hours as needed (pain). ,  Disp: , Rfl:  .  pravastatin (PRAVACHOL) 10 MG tablet, Take 1 tablet (10 mg total) by mouth daily. Must keep appt w/new provider for future refills, Disp: 90 tablet, Rfl: 3 .  sertraline (ZOLOFT) 25 MG tablet, Take 1 tablet (25 mg total) by mouth daily. Must keep appt w/new provider for future refills, Disp: 90 tablet, Rfl: 3 .  vitamin B-12 (CYANOCOBALAMIN) 100 MCG tablet, Take 100 mcg by mouth daily., Disp: , Rfl:   Exam: Current vital signs: BP 123/84   Pulse 85   Temp 98.3 F (36.8 C) (Oral)   Resp 17   Ht 5\' 9"  (1.753 m)   Wt 53.8 kg (118 lb 11.2 oz)   SpO2 100%   BMI 17.53 kg/m  Vital signs in last 24 hours: Temp:  [98.3 F (36.8 C)] 98.3 F (36.8 C) (02/22 1148) Pulse Rate:  [85-101] 85 (02/22 1200) Resp:  [17-21] 17 (02/22 1200) BP: (123-130)/(84-86) 123/84 (02/22 1200) SpO2:  [100 %] 100 % (02/22 1200) Weight:  [53.8 kg (118 lb 11.2 oz)] 53.8 kg (118 lb 11.2 oz) (02/22 1149)  GENERAL: Awake, alert in NAD HEENT: - Normocephalic and atraumatic, dry mm, no LN++, no Thyromegally LUNGS - Clear to auscultation bilaterally with no wheezes CV - S1S2 RRR, no m/r/g, equal pulses bilaterally. ABDOMEN - Soft, nontender, nondistended with normoactive BS Ext: warm, well perfused, intact peripheral pulses, no edema  NEURO:  Mental Status: AA&Ox3  Language: speech is stuttering and may be mildly dysarthric.  Naming, repetition, fluency, and comprehension intact. Cranial Nerves: PERRL. EOMI, visual fields full, no facial asymmetry, facial sensation intact, hearing intact, tongue/uvula/soft palate midline, normal sternocleidomastoid and trapezius muscle strength. No evidence of tongue atrophy or fibrillations Motor: Grossly symmetric 5/5 all over with some tremulousness which seemed volitional and seemed to appear only when he was being talked to or attended to.  On the CT scanner table, when he was in the scanner, no tremulousness noted. Tone: is normal and bulk is  normal Sensation-decreased to light touch on the left arm and left leg compared to the right side.  Initially said that his sensation on the face was intact but later on more detailed exam reported decreased sensation on the whole left hemibody. Coordination: FTN intact bilaterally, no ataxia in BLE Gait- deferred  NIHSS - 2   Labs I have reviewed labs in epic and the results pertinent to this consultation are:  CBC    Component Value Date/Time   WBC 7.4 10/16/2017 1115   RBC 4.86 10/16/2017 1115   HGB 16.0 10/16/2017 1124   HCT 47.0 10/16/2017 1124   PLT 192 10/16/2017 1115   MCV 91.4 10/16/2017 1115   MCV 84 01/25/2016   MCH 30.5 10/16/2017 1115   MCHC 33.3 10/16/2017 1115   RDW 13.7 10/16/2017 1115   RDW 305 01/25/2016   LYMPHSABS 1.8 10/16/2017  1115   MONOABS 0.4 10/16/2017 1115   EOSABS 0.2 10/16/2017 1115   BASOSABS 0.0 10/16/2017 1115    CMP     Component Value Date/Time   NA 139 10/16/2017 1124   K 4.2 10/16/2017 1124   CL 102 10/16/2017 1124   CO2 22 10/16/2017 1115   GLUCOSE 89 10/16/2017 1124   BUN 10 10/16/2017 1124   CREATININE 0.90 10/16/2017 1124   CALCIUM 9.0 10/16/2017 1115   PROT 7.6 10/16/2017 1115   ALBUMIN 4.1 10/16/2017 1115   AST 29 10/16/2017 1115   ALT 16 (L) 10/16/2017 1115   ALKPHOS 77 10/16/2017 1115   BILITOT 0.5 10/16/2017 1115   GFRNONAA >60 10/16/2017 1115   GFRAA >60 10/16/2017 1115    Lipid Panel     Component Value Date/Time   CHOL 182 04/28/2017 0901   TRIG 87.0 04/28/2017 0901   HDL 63.10 04/28/2017 0901   CHOLHDL 3 04/28/2017 0901   VLDL 17.4 04/28/2017 0901   LDLCALC 102 (H) 04/28/2017 0901   Imaging I have reviewed the images obtained:  CT-scan of the brain -no acute changes.  No bleeds.  Aspects 10.  I had a possible concern for hyperdense right M1 in the insula for which I pursued a CT angiogram of the head and neck. CT angiogram head and neck-no acute changes.  No LVO.  Assessment:  77 year old man with  above said past medical history presenting for evaluation of 2 hours worth of left-sided tingling and numbness that had subsided and then reappeared. On examination had subjective sensory changes along with stuttering speech and some inconsistencies in the exam and his tremulousness. My suspicion for stroke is low although I cannot rule it out.  If all workup is negative, conversion disorder should also be kept in the differentials. If this is a stroke, it probably is a small vessel lacunar. Due to the low disability with a NIH stroke scale of 2, he was not deemed to be a candidate for IV TPA. There were no signs of LVO and CT angiogram of the head and neck did not show any large vessel occlusion hence not a candidate for endovascular thrombectomy. He will benefit from stroke risk factor workup and optimization of medical management for stroke prevention.  Impression: Evaluate for stroke/TIA  Recommendations: -Admit to hospitalist -Telemetry monitoring -Allow for permissive hypertension for the first 24-48h - only treat PRN if SBP >220 mmHg. Blood pressures can be gradually normalized to SBP<140 upon discharge. -MRI brain without contrast -Echocardiogram -HgbA1c, fasting lipid panel -Frequent neuro checks -Prophylactic therapy-Antiplatelet med: Aspirin - dose 325mg  PO or 300mg  PR -Atorvastatin 80 -Risk factor modification -I discussed the importance of exercise as well as smoking/alcohol/illicit drug use cessation. -PT consult, OT consult, Speech consult -Continue home Aricept  Please page stroke NP/PA/MD (listed on AMION)  from 8am-4 pm as this patient will be followed by the stroke team at this point.  -- Milon DikesAshish Rhyleigh Grassel, MD [facility] Pager: [phone] If 7pm to 7am, please call on call as listed on AMION.

## 2017-10-17 ENCOUNTER — Observation Stay (HOSPITAL_BASED_OUTPATIENT_CLINIC_OR_DEPARTMENT_OTHER): Payer: Medicare PPO

## 2017-10-17 DIAGNOSIS — F419 Anxiety disorder, unspecified: Secondary | ICD-10-CM | POA: Diagnosis not present

## 2017-10-17 DIAGNOSIS — G629 Polyneuropathy, unspecified: Secondary | ICD-10-CM

## 2017-10-17 DIAGNOSIS — I361 Nonrheumatic tricuspid (valve) insufficiency: Secondary | ICD-10-CM

## 2017-10-17 DIAGNOSIS — Z8679 Personal history of other diseases of the circulatory system: Secondary | ICD-10-CM

## 2017-10-17 DIAGNOSIS — F339 Major depressive disorder, recurrent, unspecified: Secondary | ICD-10-CM | POA: Diagnosis not present

## 2017-10-17 DIAGNOSIS — F0390 Unspecified dementia without behavioral disturbance: Secondary | ICD-10-CM | POA: Diagnosis not present

## 2017-10-17 DIAGNOSIS — R2990 Unspecified symptoms and signs involving the nervous system: Secondary | ICD-10-CM | POA: Diagnosis not present

## 2017-10-17 DIAGNOSIS — G3184 Mild cognitive impairment, so stated: Secondary | ICD-10-CM | POA: Diagnosis not present

## 2017-10-17 DIAGNOSIS — I10 Essential (primary) hypertension: Secondary | ICD-10-CM

## 2017-10-17 DIAGNOSIS — F329 Major depressive disorder, single episode, unspecified: Secondary | ICD-10-CM | POA: Diagnosis not present

## 2017-10-17 DIAGNOSIS — G459 Transient cerebral ischemic attack, unspecified: Secondary | ICD-10-CM | POA: Diagnosis not present

## 2017-10-17 DIAGNOSIS — E785 Hyperlipidemia, unspecified: Secondary | ICD-10-CM

## 2017-10-17 DIAGNOSIS — F17200 Nicotine dependence, unspecified, uncomplicated: Secondary | ICD-10-CM

## 2017-10-17 DIAGNOSIS — R2689 Other abnormalities of gait and mobility: Secondary | ICD-10-CM | POA: Diagnosis not present

## 2017-10-17 LAB — RAPID URINE DRUG SCREEN, HOSP PERFORMED
Amphetamines: NOT DETECTED
BENZODIAZEPINES: NOT DETECTED
Barbiturates: NOT DETECTED
COCAINE: NOT DETECTED
OPIATES: NOT DETECTED
Tetrahydrocannabinol: NOT DETECTED

## 2017-10-17 LAB — LIPID PANEL
CHOL/HDL RATIO: 3.1 ratio
Cholesterol: 172 mg/dL (ref 0–200)
HDL: 56 mg/dL (ref >40)
LDL CALC: 101 mg/dL — AB (ref 0–99)
Triglycerides: 77 mg/dL (ref <150)
VLDL: 15 mg/dL (ref 0–40)

## 2017-10-17 LAB — ECHOCARDIOGRAM COMPLETE
Height: 69 in
WEIGHTICAEL: 1899.2 [oz_av]

## 2017-10-17 MED ORDER — ASPIRIN 325 MG PO TABS: 325.0000 mg | ORAL_TABLET | Freq: Every day | ORAL | 0 refills | Status: DC

## 2017-10-17 MED ORDER — ATORVASTATIN CALCIUM 20 MG PO TABS: 20.0000 mg | ORAL_TABLET | Freq: Every day | ORAL | 0 refills | Status: DC

## 2017-10-17 MED ORDER — ATORVASTATIN CALCIUM 10 MG PO TABS: 20.0000 mg | ORAL_TABLET | Freq: Every day | ORAL | Status: DC

## 2017-10-17 MED ORDER — NICOTINE 14 MG/24HR TD PT24
14.0000 mg | MEDICATED_PATCH | Freq: Every day | TRANSDERMAL | Status: DC
  Administered 2017-10-17: 14 mg via TRANSDERMAL
  Filled 2017-10-17: qty 1

## 2017-10-17 NOTE — Discharge Summary (Signed)
Discharge Summary  Roberto Wallace ION:[id number] DOB: [birth year]  PCP: Veryl Speak, FNP  Admit date: 10/16/2017 Discharge date: 10/17/2017  Time spent: 25 minutes  Recommendations for Outpatient Follow-up:  1. Follow up with neurology 2. Follow up with PCP 3. Take your medications as prescribed 4. Continue physical therapy at home  Discharge Diagnoses:  Active Hospital Problems   Diagnosis Date Noted  . Stroke-like symptoms 10/16/2017  . Hyperlipidemia 10/16/2017  . Depression 10/16/2017  . Mild dementia 10/16/2017  . 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 Neuropathy 10/03/2015  . Decreased mobility 10/03/2015  . TOBACCO USER 06/14/2010  . Anxiety and depression 11/19/2009  . HYPERTENSION, BENIGN ESSENTIAL 11/19/2009  . History of cardiovascular disorder 11/19/2009    Resolved Hospital Problems  No resolved problems to display.    Discharge Condition: stable  Diet recommendation: Resume previous diet  Vitals:   10/17/17 0730 10/17/17 1100  BP: (!) 106/59 (!) 139/91  Pulse:  79  Resp:    Temp: 98.3 F (36.8 C) 97.7 F (36.5 C)  SpO2:  99%    History of present illness:  Roberto Wallace. is a 77 y.o. male with medical history significant for HTN, HLD, PTSD, remote stiffness, history of migraines, cognitive impairment (mild dementia), as well as a prior history of TIAs "too many to count over the last 27 years" brought to the emergency department by EMS, after experiencing dizziness, without syncope or presyncope, taking his blood pressure and reporting this was very high, accompanied by subjective decreased sensation in the left arm and leg, without any other focal neurological deficits.  The symptoms occurred around 10 AM.  The patient does have a history of mild dysarthria since previous TIA "many years ago ", but denies any worsening symptoms, or dysphagia.  No headaches, nausea or vomiting.  No new vision changes.   He denies any fever, chills or night sweats.  He denies any chest pain, shortness of breath or palpitations.  He denies any cough, or recent infections. The patient denies any abdominal pain.  The patient lives alone, and ambulates with a cane.  He reports not being very compliant with his medications, "I forget to take it at times ".  He still able to drive with GPS.  He smokes about 5 cigarettes a day.  He denies any significant amount of alcohol intake, or recreational drug use.  CT angio head and neck no acute finding; MRI brain no acute intracranial abnormality. EKG sinus rhythm. Patient likely had a TIA. Physical therapy assessed with recommendation for Home health PT. Patient advised to stop smoking.  On the day of discharge the patient was hemodynamically stable. He will need to follow up with neurology and PCP post hospitalization and continue PT at home.   Hospital Course:  Principal Problem:   Stroke-like symptoms Active Problems:   TOBACCO USER   Anxiety and depression   HYPERTENSION, BENIGN ESSENTIAL   History of cardiovascular disorder   Neuropathy   Decreased mobility   Amnestic MCI (mild cognitive impairment with memory loss)   Hyperlipidemia   Depression   Mild dementia  Suspected TIA, not on daily aspirin, current smoker. No TPA was given, as he has low NIH stroke scale of 2. Rankin scale is 1. CT Angio of the head and neck did not show any large vessel occlusion,  there is no candidacy for an endovascular thrombectomy. Ldl 101, and last A1c 6.7 (2017). CBC, complete metabolic  panel are normal.   EKG sinus rythm.Deficits have resolved  2 D Echo awaiting results  Aspirin and high-dose statin,  PT/ recommends HHPT Smoking cessation  Hypertension  -stable -BP 130/85   Pulse 83   Hyperlipidemia Continue home statins  Chronic neuropathy Continue Neurontin and B12  History of Dementia, on Aricept . No acute issues Continue Aricept  Depression Continue home  Zoloft and Zyban   Procedures:  none   Consultations:  neurology  Discharge Exam: BP (!) 139/91 (BP Location: Left Arm)   Pulse 79   Temp 97.7 F (36.5 C) (Oral)   Resp (!) 22   Ht 5\' 9"  (1.753 m)   Wt 53.8 kg (118 lb 11.2 oz)   SpO2 99%   BMI 17.53 kg/m   General: 77 yo AAM thin built NAD A&O x 3 Cardiovascular: RRR no rubs or gallops  Respiratory: CTA no wheezes or rales  Discharge Instructions You were cared for by a hospitalist during your hospital stay. If you have any questions about your discharge medications or the care you received while you were in the hospital after you are discharged, you can call the unit and asked to speak with the hospitalist on call if the hospitalist that took care of you is not available. Once you are discharged, your primary care physician will handle any further medical issues. Please note that NO REFILLS for any discharge medications will be authorized once you are discharged, as it is imperative that you return to your primary care physician (or establish a relationship with a primary care physician if you do not have one) for your aftercare needs so that they can reassess your need for medications and monitor your lab values.  Discharge Instructions    Ambulatory referral to Neurology   Complete by:  As directed    An appointment is requested in approximately: 4 weeks     Allergies as of 10/17/2017   No Known Allergies     Medication List    STOP taking these medications   CENTRUM SILVER ADULT 50+ Tabs   phenylephrine 10 MG Tabs tablet Commonly known as:  SUDAFED PE   pravastatin 10 MG tablet Commonly known as:  PRAVACHOL     TAKE these medications   aspirin 325 MG tablet Take 1 tablet (325 mg total) by mouth daily. Start taking on:  10/18/2017   atorvastatin 20 MG tablet Commonly known as:  LIPITOR Take 1 tablet (20 mg total) by mouth daily at 6 PM.   bisacodyl 10 MG suppository Commonly known as:  DULCOLAX Place 1  suppository (10 mg total) rectally as needed for moderate constipation.   buPROPion 150 MG 12 hr tablet Commonly known as:  ZYBAN Take 1 tablet by mouth daily for 3 days then 1 tablet by mouth twice daily.   donepezil 10 MG tablet Commonly known as:  ARICEPT Take 1 tablet (10 mg total) by mouth at bedtime.   gabapentin 300 MG capsule Commonly known as:  NEURONTIN Take 1 capsule (300 mg total) by mouth 3 (three) times daily. Must keep appt w/new provider for future refills   hydrochlorothiazide 12.5 MG tablet Commonly known as:  HYDRODIURIL Take 1 tablet (12.5 mg total) by mouth daily. Must keep appt w/new provider for future refills   meloxicam 15 MG tablet Commonly known as:  MOBIC TAKE ONE TABLET BY MOUTH  DAILY   nicotine 21 mg/24hr patch Commonly known as:  NICODERM CQ Place 1 patch (21 mg total) onto  the skin daily.   sertraline 25 MG tablet Commonly known as:  ZOLOFT Take 1 tablet (25 mg total) by mouth daily. Must keep appt w/new provider for future refills   vitamin B-12 100 MCG tablet Commonly known as:  CYANOCOBALAMIN Take 100 mcg by mouth daily.      No Known Allergies Follow-up Information    Drema DallasJaffe, Adam R, DO. Schedule an appointment as soon as possible for a visit in 4 week(s).   Specialty:  Neurology Contact information: [address] [place] [address] [phone]        Veryl Speakalone, Gregory D, FNP Follow up.   Specialties:  Family Medicine, Infectious Diseases Contact information: [address] [place] [address] [phone]        [facility] Follow [facility].   Specialty:  Home Health Services Why:  RN and physical therapist will call you to [facility] Contact information: [address] [place] [address] [phone]            The results of significant diagnostics from this hospitalization (including imaging, microbiology, ancillary and laboratory) are listed below  for reference.    Significant Diagnostic Studies: Ct Angio Head W Or Wo Contrast  Result Date: 10/16/2017 CLINICAL DATA:  Code stroke evaluation. Left arm numbness. Last seen normal 2 hours ago. Negative head CT. EXAM: CT ANGIOGRAPHY HEAD AND NECK TECHNIQUE: Multidetector CT imaging of the head and neck was performed using the standard protocol during bolus administration of intravenous contrast. Multiplanar CT image reconstructions and MIPs were obtained to evaluate the vascular anatomy. Carotid stenosis measurements (when applicable) are obtained utilizing NASCET criteria, using the distal internal carotid diameter as the denominator. CONTRAST:  100mL ISOVUE-370 IOPAMIDOL (ISOVUE-370) INJECTION 76% COMPARISON:  Head CT earlier same day FINDINGS: CTA NECK FINDINGS Aortic arch: Aortic atherosclerosis. No aneurysm or dissection. 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 Right carotid system: Common carotid artery widely patent to the bifurcation region. There is atherosclerotic plaque at the carotid bifurcation but no stenosis or irregularity. Cervical ICA is widely patent. Left carotid system: Common carotid artery widely patent to the bifurcation region. Mild soft plaque at the bifurcation but no stenosis or irregularity. Vertebral arteries: Both vertebral artery origins are widely patent. Both vertebral arteries are widely patent through the cervical region. As noted above, left vertebral artery arises directly from the arch and is a small vessel. Skeleton: Degenerative spondylosis.  No acute bone finding. Other neck: No mass or lymphadenopathy. Upper chest: Scarring and emphysematous changes.  No acute finding. Review of the MIP images confirms the above findings CTA HEAD FINDINGS Anterior circulation: Both internal carotid arteries are patent through the skull base and siphon regions. Peripheral atherosclerotic calcification in the carotid siphon regions but no stenosis greater than  about 20%. Anterior and middle cerebral vessels are patent without proximal stenosis, aneurysm or vascular malformation. Posterior circulation: Both vertebral arteries are patent through the foramen magnum with the right being larger than the left. Left vertebral artery primarily ends in PICA, with a tiny contribution to the basilar. No basilar stenosis. Posterior circulation branch vessels are patent. PCAs receive primary supply from the anterior circulation. Venous sinuses: Patent and normal. Anatomic variants: None significant Delayed phase: No abnormal enhancement Review of the MIP images confirms the above findings IMPRESSION: No acute finding.  No emergent large vessel occlusion. Mild atherosclerotic disease at the carotid bifurcations but without stenosis or irregularity.  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 Mild atherosclerotic change at the origin but without stenosis or occlusion. Ordinary atherosclerotic change in the carotid siphon regions without evidence of flow limiting stenosis. No intracranial large or medium vessel abnormality seen. Electronically Signed   By: Paulina Fusi M.D.   On: 10/16/2017 12:11   Ct Angio Neck W Or Wo Contrast  Result Date: 10/16/2017 CLINICAL DATA:  Code stroke evaluation. Left arm numbness. Last seen normal 2 hours ago. Negative head CT. EXAM: CT ANGIOGRAPHY HEAD AND NECK TECHNIQUE: Multidetector CT imaging of the head and neck was performed using the standard protocol during bolus administration of intravenous contrast. Multiplanar CT image reconstructions and MIPs were obtained to evaluate the vascular anatomy. Carotid stenosis measurements (when applicable) are obtained utilizing NASCET criteria, using the distal internal carotid diameter as the denominator. CONTRAST:  ISOVUE-370 IOPAMIDOL (ISOVUE-370) INJECTION 76% COMPARISON:  Head CT earlier same day FINDINGS: CTA NECK FINDINGS Aortic arch: Aortic atherosclerosis. No aneurysm or  dissection. 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 Right carotid system: Common carotid artery widely patent to the bifurcation region. There is atherosclerotic plaque at the carotid bifurcation but no stenosis or irregularity. Cervical ICA is widely patent. Left carotid system: Common carotid artery widely patent to the bifurcation region. Mild soft plaque at the bifurcation but no stenosis or irregularity. Vertebral arteries: Both vertebral artery origins are widely patent. Both vertebral arteries are widely patent through the cervical region. As noted above, left vertebral artery arises directly from the arch and is a small vessel. Skeleton: Degenerative spondylosis.  No acute bone finding. Other neck: No mass or lymphadenopathy. Upper chest: Scarring and emphysematous changes.  No acute finding. Review of the MIP images confirms the above findings CTA HEAD FINDINGS Anterior circulation: Both internal carotid arteries are patent through the skull base and siphon regions. Peripheral atherosclerotic calcification in the carotid siphon regions but no stenosis greater than about 20%. Anterior and middle cerebral vessels are patent without proximal stenosis, aneurysm or vascular malformation. Posterior circulation: Both vertebral arteries are patent through the foramen magnum with the right being larger than the left. Left vertebral artery primarily ends in PICA, with a tiny contribution to the basilar. No basilar stenosis. Posterior circulation branch vessels are patent. PCAs receive primary supply from the anterior circulation. Venous sinuses: Patent and normal. Anatomic variants: None significant Delayed phase: No abnormal enhancement Review of the MIP images confirms the above findings IMPRESSION: No acute finding.  No emergent large vessel occlusion. Mild atherosclerotic disease at the carotid bifurcations but without stenosis or irregularity. Left vertebral artery takes an anomalous  origin from the arch. Mild atherosclerotic change at the origin but without stenosis or occlusion. Ordinary atherosclerotic change in the carotid siphon regions without evidence of flow limiting stenosis. No intracranial large or medium vessel abnormality seen. Electronically Signed   By: Paulina Fusi M.D.   On: 10/16/2017 12:11   Mr Brain Wo Contrast  Result Date: 10/16/2017 CLINICAL DATA:  77 year old male code stroke. With acute onset left extremity numbness and tingling. EXAM: MRI HEAD WITHOUT CONTRAST MRA HEAD WITHOUT CONTRAST TECHNIQUE: Multiplanar, multiecho pulse sequences of the brain and surrounding structures were obtained without intravenous contrast. Angiographic images of the head were obtained using MRA technique without contrast. COMPARISON:  CTA head and neck 1140 hr today.  Brain MRI 01/09/2015. FINDINGS: MRI HEAD FINDINGS Brain: No restricted diffusion to suggest acute infarction. No midline shift, mass effect, evidence of  mass lesion, ventriculomegaly, extra-axial collection or acute intracranial hemorrhage. Cervicomedullary junction and pituitary are within normal limits. Stable cerebral volume since 2016. Scattered bilateral mostly periventricular white matter T2 and FLAIR patchy hyperintensity is stable since 2016. No cortical encephalomalacia or chronic cerebral blood products are identified. The bilateral deep gray matter nuclei, the brainstem, and cerebellum appear normal. Vascular: Major intracranial vascular flow voids are stable since 2016. Skull and upper cervical spine: Negative visible cervical spine. Visualized bone marrow signal is within normal limits. Sinuses/Orbits: Stable and negative. Other: Visible internal auditory structures appear normal. Mastoid air cells remain clear. Scalp and face soft tissues appear negative. MRA HEAD FINDINGS Antegrade flow in the posterior circulation with a mildly dominant distal right vertebral artery, and a diminutive left vertebral artery  beyond the left PICA origin. Normal right PICA origin. Tortuous vertebrobasilar junction. Mildly tortuous basilar artery without stenosis. Normal SCA origins. Bilateral fetal type PCA origins. Bilateral PCA branches are within normal limits. Antegrade flow in both ICA siphons. Mild siphon irregularity. No siphon stenosis. Ophthalmic and posterior communicating artery origins are normal. Patent carotid termini. Normal MCA and ACA origins. Anterior communicating artery and visible ACA branches appear normal. Bilateral MCA M1 segments, MCA bifurcations, and visible bilateral MCA branches are within normal limits. IMPRESSION: 1.  No acute intracranial abnormality. 2. Stable noncontrast MRI appearance of the brain since 2016, with only mild for age chronic nonspecific white matter signal changes. 3. Normal for age intracranial MRA. Electronically Signed   By: Odessa Fleming M.D.   On: 10/16/2017 15:25   Mr Maxine Glenn Head Wo Contrast  Result Date: 10/16/2017 CLINICAL DATA:  77 year old male code stroke. With acute onset left extremity numbness and tingling. EXAM: MRI HEAD WITHOUT CONTRAST MRA HEAD WITHOUT CONTRAST TECHNIQUE: Multiplanar, multiecho pulse sequences of the brain and surrounding structures were obtained without intravenous contrast. Angiographic images of the head were obtained using MRA technique without contrast. COMPARISON:  CTA head and neck 1140 hr today.  Brain MRI 01/09/2015. FINDINGS: MRI HEAD FINDINGS Brain: No restricted diffusion to suggest acute infarction. No midline shift, mass effect, evidence of mass lesion, ventriculomegaly, extra-axial collection or acute intracranial hemorrhage. Cervicomedullary junction and pituitary are within normal limits. Stable cerebral volume since 2016. Scattered bilateral mostly periventricular white matter T2 and FLAIR patchy hyperintensity is stable since 2016. No cortical encephalomalacia or chronic cerebral blood products are identified. The bilateral deep gray matter  nuclei, the brainstem, and cerebellum appear normal. Vascular: Major intracranial vascular flow voids are stable since 2016. Skull and upper cervical spine: Negative visible cervical spine. Visualized bone marrow signal is within normal limits. Sinuses/Orbits: Stable and negative. Other: Visible internal auditory structures appear normal. Mastoid air cells remain clear. Scalp and face soft tissues appear negative. MRA HEAD FINDINGS Antegrade flow in the posterior circulation with a mildly dominant distal right vertebral artery, and a diminutive left vertebral artery beyond the left PICA origin. Normal right PICA origin. Tortuous vertebrobasilar junction. Mildly tortuous basilar artery without stenosis. Normal SCA origins. Bilateral fetal type PCA origins. Bilateral PCA branches are within normal limits. Antegrade flow in both ICA siphons. Mild siphon irregularity. No siphon stenosis. Ophthalmic and posterior communicating artery origins are normal. Patent carotid termini. Normal MCA and ACA origins. Anterior communicating artery and visible ACA branches appear normal. Bilateral MCA M1 segments, MCA bifurcations, and visible bilateral MCA branches are within normal limits. IMPRESSION: 1.  No acute intracranial abnormality. 2. Stable noncontrast MRI appearance of the brain since 2016, with only mild for  age chronic nonspecific white matter signal changes. 3. Normal for age intracranial MRA. Electronically Signed   By: Odessa Fleming M.D.   On: 10/16/2017 15:25   Ct Head Code Stroke Wo Contrast  Result Date: 10/16/2017 CLINICAL DATA:  Code stroke. Left arm numbness. Last seen normal 10 a.m. EXAM: CT HEAD WITHOUT CONTRAST TECHNIQUE: Contiguous axial images were obtained from the base of the skull through the vertex without intravenous contrast. COMPARISON:  11/20/2016 FINDINGS: Brain: No evidence of acute infarction, hemorrhage, hydrocephalus, extra-axial collection or mass lesion/mass effect. Vascular: No hyperdense  vessel or unexpected calcification. Skull: Normal. Negative for fracture or focal lesion. Sinuses/Orbits: Negative Other: These results were communicated to Dr. Wilford Corner at 11:33 amon 2/22/2019by text page via the [facility] messaging system. ASPECTS [facility] Stroke Program Early CT Score) - Ganglionic level infarction (caudate, lentiform nuclei, internal capsule, insula, M1-M3 cortex): 7 - Supraganglionic infarction (M4-M6 cortex): 3 Total score (0-10 with 10 being normal): 10 IMPRESSION: No acute finding.  ASPECTS is 10. Electronically Signed   By: Marnee Spring M.D.   On: 10/16/2017 11:34    Microbiology: No results found for this or any previous visit (from the past 240 hour(s)).   Labs: Basic Metabolic Panel: Recent Labs  Lab 10/16/17 1115 10/16/17 1124  NA 135 139  K 4.2 4.2  CL 100* 102  CO2 22  --   GLUCOSE 90 89  BUN 9 10  CREATININE 0.98 0.90  CALCIUM 9.0  --    Liver Function Tests: Recent Labs  Lab 10/16/17 1115  AST 29  ALT 16*  ALKPHOS 77  BILITOT 0.5  PROT 7.6  ALBUMIN 4.1   No results for input(s): LIPASE, AMYLASE in the last 168 hours. No results for input(s): AMMONIA in the last 168 hours. CBC: Recent Labs  Lab 10/16/17 1115 10/16/17 1124  WBC 7.4  --   NEUTROABS 5.0  --   HGB 14.8 16.0  HCT 44.4 47.0  MCV 91.4  --   PLT 192  --    Cardiac Enzymes: No results for input(s): CKTOTAL, CKMB, CKMBINDEX, TROPONINI in the last 168 hours. BNP: BNP (last 3 results) No results for input(s): BNP in the last 8760 hours.  ProBNP (last 3 results) No results for input(s): PROBNP in the last 8760 hours.  CBG: No results for input(s): GLUCAP in the last 168 hours.     Signed:  Darlin Drop, MD [facility] 10/17/2017, 3:27 PM

## 2017-10-17 NOTE — Care Management Obs Status (Signed)
MEDICARE OBSERVATION STATUS NOTIFICATION   Patient Details  Name: Roberto HatchetCharles O Poppen [name] MRN: [id number] Date of Birth: Dec 20, 1940   Medicare Observation Status Notification Given:  Yes    Deveron Furlongshley  Irvan Tiedt, RN 10/17/2017, 2:35 PM

## 2017-10-17 NOTE — Progress Notes (Signed)
  Echocardiogram 2D Echocardiogram has been performed.  Leta JunglingCooper, Mckynlie Vanderslice M 10/17/2017, 2:23 PM

## 2017-10-17 NOTE — Discharge Instructions (Signed)
Transient Ischemic Attack A transient ischemic attack (TIA) is a "warning stroke" that causes stroke-like symptoms. A TIA does not cause lasting damage to the brain. The symptoms of a TIA can happen fast and do not last long. It is important to know the symptoms of a TIA and what to do. This can help prevent stroke or death. Follow these instructions at home:  Take medicines only as told by your doctor. Make sure you understand all of the instructions.  You may need to take aspirin or warfarin medicine. Warfarin needs to be taken exactly as told. ? Taking too much or too little warfarin is dangerous. Blood tests must be done as often as told by your doctor. A PT blood test measures how long it takes for blood to clot. Your PT is used to calculate another value called an INR. Your PT and INR help your doctor adjust your warfarin dosage. He or she will make sure you are taking the right amount. ? Food can cause problems with warfarin and affect the results of your blood tests. This is true for foods high in vitamin K. Eat the same amount of foods high in vitamin K each day. Foods high in vitamin K include spinach, kale, broccoli, cabbage, collard and turnip greens, Brussels sprouts, peas, cauliflower, seaweed, and parsley. Other foods high in vitamin K include beef and pork liver, green tea, and soybean oil. Eat the same amount of foods high in vitamin K each day. Avoid big changes in your diet. Tell your doctor before changing your diet. Talk to a food specialist (dietitian) if you have questions. ? Many medicines can cause problems with warfarin and affect your PT and INR. Tell your doctor about all medicines you take. This includes vitamins and dietary pills (supplements). Do not take or stop taking any prescribed or over-the-counter medicines unless your doctor tells you to. ? Warfarin can cause more bruising or bleeding. Hold pressure over any cuts for longer than normal. Talk to your doctor about other  side effects of warfarin. ? Avoid sports or activities that may cause injury or bleeding. ? Be careful when you shave, floss, or use sharp objects. ? Avoid or drink very little alcohol while taking warfarin. Tell your doctor if you change how much alcohol you drink. ? Tell your dentist and other doctors that you take warfarin before any procedures.  Follow your diet program as told, if you are given one.  Keep a healthy weight.  Stay active. Try to get at least 30 minutes of activity on all or most days.  Do not use any tobacco products, including cigarettes, chewing tobacco, or electronic cigarettes. If you need help quitting, ask your doctor.  Limit alcohol intake to no more than 1 drink per day for nonpregnant women and 2 drinks per day for men. One drink equals 12 ounces of beer, 5 ounces of wine, or 1 ounces of hard liquor.  Do not abuse drugs.  Keep your home safe so you do not fall. You can do this by: ? Putting grab bars in the bedroom and bathroom. ? Raising toilet seats. ? Putting a seat in the shower.  Keep all follow-up visits as told by your doctor. This is important. Contact a doctor if:  Your personality changes.  You have trouble swallowing.  You have double vision.  You are dizzy.  You have a fever. Get help right away if: These symptoms may be an emergency. Do not wait to see if   the symptoms will go away. Get medical help right away. Call your local emergency services (911 in the [place] Do not drive yourself to the hospital.  You have sudden weakness or lose feeling (go numb), especially on one side of the body. This can affect your: ? Face. ? Arm. ? Leg.  You have sudden trouble walking.  You have sudden trouble moving your arms or legs.  You have sudden confusion.  You have trouble talking.  You have trouble understanding.  You have sudden trouble seeing in one or both eyes.  You lose your balance.  Your movements are not smooth.  You  have a sudden, very bad headache with no known cause.  You have new chest pain.  Your heartbeat is unsteady.  You are partly or totally unaware of what is going on around you.  This information is not intended to replace advice given to you by your health care provider. Make sure you discuss any questions you have with your health care provider. Document Released: 05/20/2008 Document Revised: 04/14/2016 Document Reviewed: 11/16/2013 Elsevier Interactive Patient Education  2018 [employer] Stroke Prevention Some health problems and behaviors may make it more likely for you to have a stroke. Below are ways to lessen your risk of having a stroke.  Be active for at least 30 minutes on most or all days.  Do not smoke. Try not to be around others who smoke.  Do not drink too much alcohol. ? Do not have more than 2 drinks a day if you are a man. ? Do not have more than 1 drink a day if you are a woman and are not pregnant.  Eat healthy foods, such as fruits and vegetables. If you were put on a specific diet, follow the diet as told.  Keep your cholesterol levels under control through diet and medicines. Look for foods that are low in saturated fat, trans fat, cholesterol, and are high in fiber.  If you have diabetes, follow all diet plans and take your medicine as told.  Ask your doctor if you need treatment to lower your blood pressure. If you have high blood pressure (hypertension), follow all diet plans and take your medicine as told by your doctor.  If you are 18-39 years old, have your blood pressure checked every 3-5 years. If you are age 40 or older, have your blood pressure checked every year.  Keep a healthy weight. Eat foods that are low in calories, salt, saturated fat, trans fat, and cholesterol.  Do not take drugs.  Avoid birth control pills, if this applies. Talk to your doctor about the risks of taking birth control pills.  Talk to your doctor if you have sleep  problems (sleep apnea).  Take all medicine as told by your doctor. ? You may be told to take aspirin or blood thinner medicine. Take this medicine as told by your doctor. ? Understand your medicine instructions.  Make sure any other conditions you have are being taken care of.  Get help right away if:  You suddenly lose feeling (you feel numb) or have weakness in your face, arm, or leg.  Your face or eyelid hangs down to one side.  You suddenly feel confused.  You have trouble talking (aphasia) or understanding what people are saying.  You suddenly have trouble seeing in one or both eyes.  You suddenly have trouble walking.  You are dizzy.  You lose your balance or your movements are clumsy (uncoordinated).    You suddenly have a very bad headache and you do not know the cause.  You have new chest pain.  Your heart feels like it is fluttering or skipping a beat (irregular heartbeat). Do not wait to see if the symptoms above go away. Get help right away. Call your local emergency services (911 in [place] Do not drive yourself to the hospital. This information is not intended to replace advice given to you by your health care provider. Make sure you discuss any questions you have with your health care provider. Document Released: 02/10/2012 Document Revised: 01/17/2016 Document Reviewed: 02/11/2013 Elsevier Interactive Patient Education  2018 [employer] Inc[employer]  

## 2017-10-17 NOTE — Progress Notes (Signed)
OT Evaluation  PTA, pt lived alone and was modified independent with ADL and mobility. Pt close to baseline level of functioning. Pt assessed using the [place] and received a score of 22/30 (below 26 considered impaired). Pt states he no longer cooks due to forgetting to turn things off and "almost burning his house down". Pt states he drives and pays his bills but sometimes forgets his medications. Recommend follow up with HHOT to further address compensatory strategies and use of adaptive technologies to increase independence with IADL tasks and safety within the home. All further OT to be addressed by [facility].    10/17/17 1300  OT Visit Information  Last OT Received On 10/17/17  Assistance Needed +1  History of Present Illness Roberto Wallace. is a 77 y.o. male who has a past medical history of hypertension, PTSD, syphilis, migraines, cognitive impairment, who was in his usual state of health who presents for tingling and numbness on his left arm and leg. Work up for TIA. MRI (-).   Precautions  Precautions Fall  Home Living  Family/patient expects to be discharged to: Private residence  Living Arrangements Alone;Children  Available Help at Discharge Family;Available PRN/intermittently  Type of Home Independent living facility  Home Access Level entry  Home Layout One level  Bathroom Shower/Tub Biochemist, clinical Yes  How Accessible Accessible via wheelchair;Accessible via walker  [place] - 2 wheels;[place] - [employer] - 4 wheels;Shower seat;BSC;Wheelchair - power;Grab bars - toilet;Grab bars - tub/shower;Shower seat - built in  Additional Comments Handicapped accessible apt  Prior Function  Level of Independence Independent with assistive device(s)  Comments Patient ambulatory with SPC and RW, long distance with W/C. Uses wc at times in apt lives alone.  Communication  Communication No difficulties  Pain  Assessment  Pain Assessment Faces  Faces Pain Scale 2  Pain Location general back pain  Pain Descriptors / Indicators Discomfort  Pain Intervention(s) Limited activity within patient's tolerance  Cognition  Arousal/Alertness Awake/alert  Behavior During Therapy WFL for tasks assessed/performed  Overall Cognitive Status History of cognitive impairments - at baseline  General Comments assessed with the MOCa - score of 22/30. Deficits with attention (serial subtraction, abstraction and delayed recall. States he no longer cooks using his stove becuae he almost burned his house down; only uses the microwave  Upper Extremity Assessment  Upper Extremity Assessment Overall WFL for tasks assessed  Lower Extremity Assessment  Lower Extremity Assessment Defer to PT evaluation  Cervical / Trunk Assessment  Cervical / Trunk Assessment Other exceptions  Cervical / Trunk Exceptions back problems  ADL  Overall ADL's  Needs assistance/impaired  Functional mobility during ADLs Supervision/safety;Rolling walker  General ADL Comments Most likely close to baeline regarding ADL tasks. Pt staets he remembers to pay his bills with the assist of autopay. Pt states he sometimes forgets his medicaiton; still drives; gets Meals on Wheels  Vision- History  Baseline Vision/History Wears glasses  Bed Mobility  Overal bed mobility Independent  Transfers  Overall transfer level Needs assistance  Equipment used Rolling walker (2 wheeled)  Transfers Sit to/from Stand  Sit to Stand Supervision  General transfer comment supervision level with [facility] for sit to stand.  Balance  Overall balance assessment Needs assistance  Sitting-balance support Feet supported;No upper extremity supported  Sitting balance-Leahy Scale Good  Standing balance support During functional activity;Single extremity supported  Standing balance-Leahy Scale Fair  OT - End of Session  Equipment Utilized During Treatment Gait belt;Rolling walker   Activity Tolerance Patient tolerated treatment well  Patient left in chair;with call bell/phone within reach  Nurse Communication Mobility status  OT Assessment  OT Recommendation/Assessment All further OT needs can be met in the next venue of care  OT Visit Diagnosis Other symptoms and signs involving cognitive function  OT Problem List Decreased cognition;Decreased safety awareness  AM-PAC OT "6 Clicks" Daily Activity Outcome Measure  Help from another person eating meals? 4  Help from another person taking care of personal grooming? 4  Help from another person toileting, which includes using toliet, bedpan, or urinal? 4  Help from another person bathing (including washing, rinsing, drying)? 4  Help from another person to put on and taking off regular upper body clothing? 4  Help from another person to put on and taking off regular lower body clothing? 4  6 Click Score 24  ADL G Code Conversion CH  OT Recommendation  Follow Up Recommendations Home health OT;Supervision - Intermittent  OT Equipment None recommended by OT  Individuals Consulted  Consulted and Agree with Results and Recommendations Patient  Acute Rehab OT Goals  Patient Stated Goal return home  OT Goal Formulation All assessment and education complete, DC therapy  OT Time Calculation  OT Start Time (ACUTE ONLY) 1103  OT Stop Time (ACUTE ONLY) 1136  OT Time Calculation (min) 33 min  OT General Charges  $OT Visit 1 Visit  OT Evaluation  $OT Eval Low Complexity 1 Low  OT Treatments  $Self Care/Home Management  8-22 mins  Written Expression  Dominant Hand Right  [facility], OT/L  [phone] 10/17/2017

## 2017-10-17 NOTE — Progress Notes (Signed)
Patient had trouble with tranportation home; taxi provided by hospital; discharge instructions given and reviewed; rx's sent electronically. Patient discharged to the ER desk  to obtain taxi ride; transported downstairs  via wheelchair.

## 2017-10-17 NOTE — Evaluation (Signed)
Physical Therapy Evaluation Patient Details Name: Roberto HatchetCharles O Daughety [name] MRN: [id number] DOB: 1941-05-24 Today's Date: 10/17/2017   History of Present Illness  Roberto RosCharles O Kerchner [name] is a 77 y.o. male who has a past medical history of hypertension, PTSD, syphilis, migraines, cognitive impairment, who was in his usual state of health when he woke up this morning and noted around 10 AM that he had some tingling and numbness on his left arm and leg.     Clinical Impression  Pt admitted with above diagnosis. Pt currently with functional limitations due to the deficits listed below (see PT Problem List). PTA, pt living alone in independent living facility ambulating with SPC and sometimes RW. Upon eval, pt presenting with minor balance deficits and hx of memory deficits. Patient reports he has returned to baseline. Currently supervision level with single point cane in hallway today, recommending use of RW to ensure safety. [facility] not need [facility]. No LOB with cane but advised patient RW would be better. Recommending HHPT come and do safety evaluation at home to ensure patients safety after discharge.  Pt will benefit from skilled PT to increase their independence and safety with mobility to allow discharge to the venue listed below.       Follow Up Recommendations Home health PT    Equipment Recommendations  None recommended by PT    Recommendations for Other Services       Precautions / Restrictions Precautions Precautions: Fall      Mobility  Bed Mobility Overal bed mobility: Independent                Transfers Overall transfer level: Needs assistance Equipment used: Rolling walker (2 wheeled) Transfers: Sit to/from Stand Sit to Stand: Supervision         General transfer comment: supervision level with SPC for sit to stand.  Ambulation/Gait Ambulation/Gait assistance: Min guard;Supervision Ambulation  Distance (Feet): 350 Feet Assistive device: Straight cane Gait Pattern/deviations: Step-through pattern;Step-to pattern Gait velocity: decreased   General Gait Details: Patient with mild unsteadiness ambulating with SPC today mostly min guard, at times progressing to supervision. Pt had no LOB and reports he is ambulating at baseline, although i strongly encourged use of RW to maximize and ensure safety. Pt reports "i do not like RW i only use the cane and hold onto fruniture aroudn the house. if i go out of the home i will take the wheel chair". despite encourgment, could not persusade patient to utilize RW.    Stairs            Wheelchair Mobility    Modified Rankin (Stroke Patients Only)       Balance Overall balance assessment: Needs assistance Sitting-balance support: Feet supported;No upper extremity supported Sitting balance-Leahy Scale: Good     Standing balance support: During functional activity;Single extremity supported Standing balance-Leahy Scale: Fair                               Pertinent Vitals/Pain Pain Assessment: No/denies pain    Home Living Family/patient expects to be discharged to:: Private residence Living Arrangements: Alone;Children Available Help at Discharge: Family;Available PRN/intermittently Type of Home: Independent living facility Home Access: Level entry     Home Layout: One level Home Equipment: Walker - 2 wheels;Cane - quad;Wheelchair - [employer]manual;Walker - 4 wheels;Shower seat      Prior Function Level of Independence:  Independent with assistive device(s)         Comments: Patient ambulatory with SPC and RW, long distance with W/C. lives alone.     Hand Dominance        Extremity/Trunk Assessment   Upper Extremity Assessment Upper Extremity Assessment: Defer to OT evaluation    Lower Extremity Assessment Lower Extremity Assessment: Overall WFL for tasks assessed(RLE=LLE strength 4/5 gross. )        Communication   Communication: No difficulties  Cognition Arousal/Alertness: Awake/alert Behavior During Therapy: WFL for tasks assessed/performed Overall Cognitive Status: History of cognitive impairments - at baseline                                 General Comments: has been forgetting to make meds at baseline      General Comments General comments (skin integrity, edema, etc.): Encourged patients on benefits of HHPT, he was opposed but ulimately agreed for at least 1 visit. Citing he doesn't feel it helps him.     Exercises     Assessment/Plan    PT Assessment Patient needs continued PT services  PT Problem List Decreased strength;Decreased range of motion;Decreased activity tolerance;Decreased balance;Decreased mobility;Decreased safety awareness;Pain       PT Treatment Interventions DME instruction;Gait training;Stair training;Functional mobility training;Balance training;Therapeutic exercise;Therapeutic activities    PT Goals (Current goals can be found in the Care Plan section)  Acute Rehab PT Goals Patient Stated Goal: return home PT Goal Formulation: With patient Time For Goal Achievement: 10/19/17 Potential to Achieve Goals: Good    Frequency Min 4X/week   Barriers to discharge        Co-evaluation               AM-PAC PT "6 Clicks" Daily Activity  Outcome Measure Difficulty turning over in bed (including adjusting bedclothes, sheets and blankets)?: None Difficulty moving from lying on back to sitting on the side of the bed? : None Difficulty sitting down on and standing up from a chair with arms (e.g., wheelchair, bedside commode, etc,.)?: None Help needed moving to and from a bed to chair (including a wheelchair)?: None Help needed walking in hospital room?: None Help needed climbing 3-5 steps with a railing? : A Little 6 Click Score: 23    End of Session Equipment Utilized During Treatment: Gait belt Activity Tolerance: Patient  tolerated treatment well Patient left: in bed;with call bell/phone within reach;with nursing/sitter in room Nurse Communication: Mobility status PT Visit Diagnosis: Unsteadiness on feet (R26.81)    Time: 1610-9604 PT Time Calculation (min) (ACUTE ONLY): 26 min   Charges:   PT Evaluation $PT Eval Low Complexity: 1 Low PT Treatments $Gait Training: 8-22 mins   PT G Codes:        Etta Grandchild, PT, DPT Acute Rehab Services Pager: [phone]    Etta Grandchild 10/17/2017, 12:32 PM

## 2017-10-17 NOTE — Progress Notes (Addendum)
NEUROHOSPITALISTS STROKE TEAM - DAILY PROGRESS NOTE   ADMISSION HISTORY: Roberto RosCharles O Breithaupt [name] is a 77 y.o. male who has a past medical history of hypertension, PTSD, syphilis, migraines, cognitive impairment, who was in his usual state of health when he woke up this morning and noted around 10 AM that he had some tingling and numbness on his left arm and leg.    He was unable to shake that feeling off and called EMS as he had concerns for stroke due to a past history of strokes.    He was brought into the emergency room as an acute code stroke for focal neurological symptoms.    On the way to the emergency room, according to EMS, his symptoms have subsided.     Upon my initial evaluation, he complained of some subjective decreased sensation on the left arm and leg.   No other focal neurological deficits with a NIH of 2 (1 for sensory and may be 1 for dysarthria) Denies any preceding fevers or chills.  Denies visual symptoms.  Denies headaches.   Denies chest pain, shortness of breath palpitations.  Denies abdominal pain.  Denies nausea vomiting.  Denies dizziness lightheadedness  He has been seen by outpatient neurology-Dr. Everlena CooperJaffe for cognitive impairment.  He continues to live alone.  He is able to take care of his finances with the help of auto payments, mobile applications and is still driving although he does not drive long distances and uses GPS for directions.  No formal neuropsych testing has been done but he has been started on Aricept and it has been increased to 10 mg daily.  LKW: 10 AM on 10/16/2017 tpa given?: no, low NIH stroke scale Premorbid modified Rankin scale (mRS): 1  SUBJECTIVE (INTERVAL HISTORY) No family is at the bedside. Patient is found laying in bed in NAD. Overall he feels his condition is completely resolved. Voices no new complaints. No new events reported overnight. Patient states all of his symptoms have resolved  this AM. He report an episode in [facility] when his heart rate was "irregular" and his thought he was going to need a pacemaker. When he went back for a check up his heart rate was normal. He has never had this problem again.   OBJECTIVE Lab Results: CBC:  Recent Labs  Lab 10/16/17 1115 10/16/17 1124  WBC 7.4  --   HGB 14.8 16.0  HCT 44.4 47.0  MCV 91.4  --   PLT 192  --    BMP: Recent Labs  Lab 10/16/17 1115 10/16/17 1124  NA 135 139  K 4.2 4.2  CL 100* 102  CO2 22  --   GLUCOSE 90 89  BUN 9 10  CREATININE 0.98 0.90  CALCIUM 9.0  --    Liver Function Tests:  Recent Labs  Lab 10/16/17 1115  AST 29  ALT 16*  ALKPHOS 77  BILITOT 0.5  PROT 7.6  ALBUMIN 4.1   Coagulation Studies:  Recent Labs    10/16/17 1115  APTT 29  INR 0.99    PHYSICAL EXAM Temp:  [97 F (36.1 C)-98.8 F (37.1 C)] 97 F (36.1 C) (02/23 0400) Pulse Rate:  [81-101] 87 (02/23 0400) Resp:  [12-23] 22 (02/23 0400) BP: (120-146)/(76-95) 123/94 (02/23 0400) SpO2:  [99 %-100 %] 100 % (02/23 0400) Weight:  [53.8 kg (118 lb 11.2 oz)] 53.8 kg (118 lb 11.2 oz) (02/22 1149) General - Well nourished, well developed, in no apparent  distress HEENT-  Normocephalic,    Cardiovascular - Regular rate and rhythm  Respiratory - Lungs clear bilaterally. No wheezing. Abdomen - soft and non-tender, BS normal Extremities- no edema or cyanosis Mental Status: AA&Ox3  Language: speech is stuttering and may be mildly dysarthric.  Naming, repetition, fluency, and comprehension intact. Cranial Nerves: PERRL. EOMI, visual fields full, no facial asymmetry, facial sensation intact, hearing intact, tongue/uvula/soft palate midline, normal sternocleidomastoid and trapezius muscle strength. No evidence of tongue atrophy or fibrillations Motor: Grossly symmetric 5/5 all over with some tremulousness which seemed volitional and seemed to appear only when he was being talked to or attended to.  On the CT  scanner table, when he was in the scanner, no tremulousness noted. Tone: is normal and bulk is normal Sensation-decreased to light touch on the left arm and left leg compared to the right side.  Initially said that his sensation on the face was intact but later on more detailed exam reported decreased sensation on the whole left hemibody. Coordination: FTN intact bilaterally, no ataxia in BLE Gait- deferred  IMAGING: I have personally reviewed the radiological images below and agree with the radiology interpretations. Ct Angio Head/ Neck W Or Wo Contrast Result Date: 10/16/2017 IMPRESSION: No acute finding.  No emergent large vessel occlusion. Mild atherosclerotic disease at the carotid bifurcations but without stenosis or irregularity. 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 Mild atherosclerotic change at the origin but without stenosis or occlusion. Ordinary atherosclerotic change in the carotid siphon regions without evidence of flow limiting stenosis. No intracranial large or medium vessel abnormality seen. Electronically Signed   By: Paulina Fusi M.D.   On: 10/16/2017 12:11   Mr Brain Wo Contrast Mr Roberto Wallace Head Wo Contrast Result Date: 10/16/2017  IMPRESSION: 1.  No acute intracranial abnormality. 2. Stable noncontrast MRI appearance of the brain since 2016, with only mild for age chronic nonspecific white matter signal changes. 3. Normal for age intracranial MRA. Electronically Signed   By: Odessa Fleming M.D.   On: 10/16/2017 15:25   Ct Head Code Stroke Wo Contrast Result Date: 10/16/2017 IMPRESSION: No acute finding.  ASPECTS is 10. Electronically Signed   By: Marnee Spring M.D.   On: 10/16/2017 11:34   Echocardiogram:                                              PENDING     IMPRESSION: Mr. Roberto Nevel. is a 77 y.o. male with PMH of hypertension, PTSD, syphilis, migraines, cognitive impairment who presents with acute onset tingling and numbness on his left arm  and leg. All imaging thus far negative for acute findings.  Likely TIA Suspected Etiology: small vessel disease vs cardioembolic Resultant Symptoms:  tingling and numbness on his left arm and leg. Stroke Risk Factors: hyperlipidemia, hypertension and smoking Other Stroke Risk Factors: Advanced age, Cigarette smoker,  Hx stroke, Migraines, Syphilis  Outstanding Stroke Work-up Studies:     Echocardiogram:                                                    PENDING  PLAN  10/17/2017: Continue Aspirin/ Statin Frequent neuro checks Telemetry monitoring PT/OT/SLP Consult  Case Management /MSW Ongoing aggressive stroke risk factor management Patient counseled to be compliant with his antithrombotic medications Patient counseled on Lifestyle modifications including, Diet, Exercise, and Stress Follow up with [facility] in 6 weeks  R/O AFIB: May need 30 day cardiac event monitor at discharge if any abnormal cardiac rhythms noted on telemetry  HYPERTENSION: Stable Permissive hypertension (OK if <220/120) for 24-48 hours post stroke and then gradually normalized within 5-7 days. Long term BP goal normotensive. May slowly restart home B/P medications after 48 hours Home Meds: HCTZ  HYPERLIPIDEMIA:    Component Value Date/Time   CHOL 172 10/17/2017 0222   TRIG 77 10/17/2017 0222   HDL 56 10/17/2017 0222   CHOLHDL 3.1 10/17/2017 0222   VLDL 15 10/17/2017 0222   LDLCALC 101 (H) 10/17/2017 0222  Home Meds:  Pravachol 10 mg LDL  goal < 70 Started on Lipitor to 20 mg daily Continue statin at discharge  R/O DIABETES: CURRENT HGA1C PENDING Lab Results  Component Value Date   HGBA1C 6.1 10/31/2015  HgbA1c goal < 7.0  TOBACCO ABUSE Current smoker Smoking cessation counseling provided Nicotine patch provided  Other Active Problems: Principal Problem:   Stroke-like symptoms Active Problems:   TOBACCO USER   Anxiety and depression   HYPERTENSION, BENIGN ESSENTIAL    History of cardiovascular disorder   Neuropathy   Decreased mobility   Amnestic MCI (mild cognitive impairment with memory loss)   Hyperlipidemia   Depression   Mild dementia    Hospital day # 0 VTE prophylaxis:  Heparin Diet : Fall precautions Aspiration precautions Diet Heart Room service appropriate? Yes; Fluid consistency: Thin   FAMILY UPDATES: No family at bedside  TEAM UPDATES: Darlin Drop, DO   Prior Home Stroke Medications:  No antithrombotic  Discharge Stroke Meds:  Please discharge patient on aspirin 325 mg daily   Disposition: 01-Home or Self Care Therapy Recs:               PENDING Follow Up:  Follow-up Information    Drema Dallas, DO. Schedule an appointment as soon as possible for a visit in 4 week(s).   Specialty:  Neurology Contact information: [address]  [address] [place] [address] [phone]          Veryl Speak, FNP -PCP Follow up in 1-2 weeks      Assessment & plan discussed with with attending physician and they are in agreement.    Brita Romp Stroke Neurology Team 10/17/2017 7:20 AM  ATTENDING ASSESSMENT:   I reviewed above note and agree with the assessment and plan. I have made any additions or clarifications directly to the above note. Pt was seen and examined.   77 year old male with history of hypertension, hyperlipidemia, depression, migraine, syphilis, smoker, cognitive impairment admitted for transient left-sided numbness and tingling.  Patient has been following with Dr. Everlena Cooper at [facility] for cognitive impairment.  Had RPR HIV negative in 10/2015.  Put on Aricept.  On this admission, patient had a sudden onset left-sided numbness tingling lasting for 45 minutes.  CT no acute abnormality, CT head and neck as well as MRA  unremarkable.  MRI no acute infarct.  LDL 101, A1c pending.  RPR HIV and UDS also pending.  EF 55-60%. Less concern of neurosyphilis at this time given resolved symptoms and no vessel  abnormality on CTA head and neck.  Put on aspirin 325 and Lipitor 20 for stroke prevention, continue on  discharge.  Continue Aricept.  And follow-up with Dr. Everlena Cooper at [facility].   Neurology will sign off. Please call with questions. Pt will follow up with Dr. Everlena Cooper at [facility] in about 6 weeks. Thanks for the consult.   Marvel Plan, MD PhD Stroke Neurology 10/17/2017 5:02 PM  To contact Stroke Continuity provider, please refer to [email or web address]. After hours, contact General Neurology

## 2017-10-17 NOTE — Care Management Note (Signed)
Case Management Note  Patient Details  Name: Roberto HatchetCharles O Harnisch [name] MRN: [id number] Date of Birth: May 19, 1941  Subjective/Objective:     Pt from home alone where he uses walker and wheelchair as needed and [facility].  Pt has dementia and CM consult placed by PA [facility]                  Action/Plan: Offered HH choice to pt.  Pt states if [facility] doesn't have services, he would like to use any agency other than [facility].  [facility]  [facility].  Expected Discharge Date:  10/17/17               Expected Discharge Plan:  Home w Home Health Services  In-House Referral:  NA  Discharge planning Services  CM Consult  Post Acute Care Choice:  Home Health Choice offered to:  Patient  DME Arranged:  N/A DME Agency:  NA  HH Arranged:  RN, PT HH Agency[facility]  [facility]  Status of Service:  Completed, signed off  If discussed at Long Length of Stay Meetings, dates discussed:    Additional Comments:  Deveron Furlongshley  Tristain Daily, RN 10/17/2017, 2:20 PM

## 2017-10-18 LAB — HIV ANTIBODY (ROUTINE TESTING W REFLEX): HIV Screen 4th Generation wRfx: NONREACTIVE

## 2017-10-18 LAB — HEMOGLOBIN A1C
HEMOGLOBIN A1C: 5.1 % (ref 4.8–5.6)
MEAN PLASMA GLUCOSE: 100 mg/dL

## 2017-10-18 LAB — RPR: RPR: NONREACTIVE

## 2017-10-19 DIAGNOSIS — I69322 Dysarthria following cerebral infarction: Secondary | ICD-10-CM | POA: Diagnosis not present

## 2017-10-19 DIAGNOSIS — F0390 Unspecified dementia without behavioral disturbance: Secondary | ICD-10-CM | POA: Diagnosis not present

## 2017-10-19 DIAGNOSIS — I10 Essential (primary) hypertension: Secondary | ICD-10-CM | POA: Diagnosis not present

## 2017-10-19 DIAGNOSIS — G629 Polyneuropathy, unspecified: Secondary | ICD-10-CM | POA: Diagnosis not present

## 2017-10-19 DIAGNOSIS — M1991 Primary osteoarthritis, unspecified site: Secondary | ICD-10-CM | POA: Diagnosis not present

## 2017-10-19 DIAGNOSIS — F419 Anxiety disorder, unspecified: Secondary | ICD-10-CM | POA: Diagnosis not present

## 2017-10-20 DIAGNOSIS — F419 Anxiety disorder, unspecified: Secondary | ICD-10-CM | POA: Diagnosis not present

## 2017-10-20 DIAGNOSIS — M1991 Primary osteoarthritis, unspecified site: Secondary | ICD-10-CM | POA: Diagnosis not present

## 2017-10-20 DIAGNOSIS — F0390 Unspecified dementia without behavioral disturbance: Secondary | ICD-10-CM | POA: Diagnosis not present

## 2017-10-20 DIAGNOSIS — I10 Essential (primary) hypertension: Secondary | ICD-10-CM | POA: Diagnosis not present

## 2017-10-20 DIAGNOSIS — I69322 Dysarthria following cerebral infarction: Secondary | ICD-10-CM | POA: Diagnosis not present

## 2017-10-20 DIAGNOSIS — G629 Polyneuropathy, unspecified: Secondary | ICD-10-CM | POA: Diagnosis not present

## 2017-10-27 DIAGNOSIS — F419 Anxiety disorder, unspecified: Secondary | ICD-10-CM | POA: Diagnosis not present

## 2017-10-27 DIAGNOSIS — G629 Polyneuropathy, unspecified: Secondary | ICD-10-CM | POA: Diagnosis not present

## 2017-10-27 DIAGNOSIS — I69322 Dysarthria following cerebral infarction: Secondary | ICD-10-CM | POA: Diagnosis not present

## 2017-10-27 DIAGNOSIS — F0390 Unspecified dementia without behavioral disturbance: Secondary | ICD-10-CM | POA: Diagnosis not present

## 2017-10-27 DIAGNOSIS — I10 Essential (primary) hypertension: Secondary | ICD-10-CM | POA: Diagnosis not present

## 2017-10-27 DIAGNOSIS — M1991 Primary osteoarthritis, unspecified site: Secondary | ICD-10-CM | POA: Diagnosis not present

## 2017-10-29 DIAGNOSIS — F0390 Unspecified dementia without behavioral disturbance: Secondary | ICD-10-CM | POA: Diagnosis not present

## 2017-10-29 DIAGNOSIS — I10 Essential (primary) hypertension: Secondary | ICD-10-CM | POA: Diagnosis not present

## 2017-10-29 DIAGNOSIS — M1991 Primary osteoarthritis, unspecified site: Secondary | ICD-10-CM | POA: Diagnosis not present

## 2017-10-29 DIAGNOSIS — I69322 Dysarthria following cerebral infarction: Secondary | ICD-10-CM | POA: Diagnosis not present

## 2017-10-29 DIAGNOSIS — G629 Polyneuropathy, unspecified: Secondary | ICD-10-CM | POA: Diagnosis not present

## 2017-10-29 DIAGNOSIS — F419 Anxiety disorder, unspecified: Secondary | ICD-10-CM | POA: Diagnosis not present

## 2017-11-02 DIAGNOSIS — F0390 Unspecified dementia without behavioral disturbance: Secondary | ICD-10-CM | POA: Diagnosis not present

## 2017-11-02 DIAGNOSIS — I10 Essential (primary) hypertension: Secondary | ICD-10-CM | POA: Diagnosis not present

## 2017-11-02 DIAGNOSIS — G629 Polyneuropathy, unspecified: Secondary | ICD-10-CM | POA: Diagnosis not present

## 2017-11-02 DIAGNOSIS — M1991 Primary osteoarthritis, unspecified site: Secondary | ICD-10-CM | POA: Diagnosis not present

## 2017-11-02 DIAGNOSIS — F419 Anxiety disorder, unspecified: Secondary | ICD-10-CM | POA: Diagnosis not present

## 2017-11-02 DIAGNOSIS — I69322 Dysarthria following cerebral infarction: Secondary | ICD-10-CM | POA: Diagnosis not present

## 2017-11-03 ENCOUNTER — Telehealth: Payer: Self-pay | Admitting: Internal Medicine

## 2017-11-03 NOTE — Telephone Encounter (Signed)
Copied from CRM [id number]. Topic: Inquiry >> Nov 03, 2017 12:43 PM Alexander BergeronBarksdale, Roberto Wallace: Reason for CRM: pt is refusing PT today so yesterday was his last visit, nurse will be discharging tomorrow

## 2017-11-05 DIAGNOSIS — F419 Anxiety disorder, unspecified: Secondary | ICD-10-CM | POA: Diagnosis not present

## 2017-11-05 DIAGNOSIS — M1991 Primary osteoarthritis, unspecified site: Secondary | ICD-10-CM | POA: Diagnosis not present

## 2017-11-05 DIAGNOSIS — I69322 Dysarthria following cerebral infarction: Secondary | ICD-10-CM | POA: Diagnosis not present

## 2017-11-05 DIAGNOSIS — I10 Essential (primary) hypertension: Secondary | ICD-10-CM | POA: Diagnosis not present

## 2017-11-05 DIAGNOSIS — G629 Polyneuropathy, unspecified: Secondary | ICD-10-CM | POA: Diagnosis not present

## 2017-11-05 DIAGNOSIS — F0390 Unspecified dementia without behavioral disturbance: Secondary | ICD-10-CM | POA: Diagnosis not present

## 2017-12-23 ENCOUNTER — Ambulatory Visit: Payer: Medicare PPO | Admitting: Internal Medicine

## 2017-12-23 DIAGNOSIS — Z0289 Encounter for other administrative examinations: Secondary | ICD-10-CM

## 2018-01-13 ENCOUNTER — Telehealth: Payer: Self-pay | Admitting: Internal Medicine

## 2018-01-13 NOTE — Telephone Encounter (Signed)
Copied from CRM [id number]. Topic: Quick Communication - See Telephone Encounter >> Jan 13, 2018 11:11 AM Trula Slade wrote: CRM for notification. See Telephone encounter for: 01/13/18. Patient wanted the provider to know that he is having problems with his Dementia. His memory is much worse since his last year hospital stay for a brain clot.  Please advise.

## 2018-01-13 NOTE — Telephone Encounter (Signed)
Please schedule OV for pt. Thanks! 

## 2018-01-13 NOTE — Telephone Encounter (Signed)
Please ask pt to make return office visit, as the last I had examined him was the first time in Oct 2018

## 2018-01-13 NOTE — Telephone Encounter (Signed)
Appointment scheduled.

## 2018-01-14 ENCOUNTER — Ambulatory Visit (INDEPENDENT_AMBULATORY_CARE_PROVIDER_SITE_OTHER): Payer: Medicare PPO | Admitting: Internal Medicine

## 2018-01-14 ENCOUNTER — Encounter: Payer: Self-pay | Admitting: Internal Medicine

## 2018-01-14 VITALS — BP 112/72 | HR 97 | Temp 98.4°F | Ht 69.0 in

## 2018-01-14 DIAGNOSIS — F03A0 Unspecified dementia, mild, without behavioral disturbance, psychotic disturbance, mood disturbance, and anxiety: Secondary | ICD-10-CM

## 2018-01-14 DIAGNOSIS — F0390 Unspecified dementia without behavioral disturbance: Secondary | ICD-10-CM | POA: Diagnosis not present

## 2018-01-14 DIAGNOSIS — Z0000 Encounter for general adult medical examination without abnormal findings: Secondary | ICD-10-CM

## 2018-01-14 MED ORDER — MEMANTINE HCL 5 MG PO TABS: 5.0000 mg | ORAL_TABLET | Freq: Two times a day (BID) | ORAL | 3 refills | Status: DC

## 2018-01-14 NOTE — Assessment & Plan Note (Signed)

## 2018-01-14 NOTE — Assessment & Plan Note (Signed)
Ok to add namenda asd,  to f/u any worsening symptoms or concerns

## 2018-01-14 NOTE — Patient Instructions (Addendum)
We will try to sign you up for the cologuard  Please take all new medication as prescribed - the namenda  Please continue all other medications as before, and refills have been done if requested.  Please have the pharmacy call with any other refills you may need.  Please continue your efforts at being more active, low cholesterol diet, and weight control.  You are otherwise up to date with prevention measures today.  Please keep your appointments with your specialists as you may have planned  Please remember to sign up for MyChart if you have not done so, as this will be important to you in the future with finding out test results, communicating by private email, and scheduling acute appointments online when needed.  Please return in 6 months, or sooner if needed

## 2018-01-14 NOTE — Progress Notes (Signed)
Subjective:    Patient ID: Roberto Ros., male    DOB: 1940-09-27, 77 y.o.   MRN: [id number]  HPI  Here for wellness and f/u;  Overall doing ok;  Pt denies Chest pain, worsening SOB, DOE, wheezing, orthopnea, PND, worsening LE edema, palpitations, dizziness or syncope.  Pt denies neurological change such as new headache, facial or extremity weakness.  Pt denies polydipsia, polyuria, or low sugar symptoms. Pt states overall good compliance with treatment and medications, good tolerability, and has been trying to follow appropriate diet.  Pt denies worsening depressive symptoms, suicidal ideation or panic. No fever, night sweats, wt loss, loss of appetite, or other constitutional symptoms.  Pt states good ability with ADL's, has low fall risk, home safety reviewed and adequate, no other significant changes in hearing or vision, and only occasionally active with exercise. Due for cologuard and states his insurance covers.  Pain patches too expensive. Taking the gabapentin which does help somewhat.  Overall gets some shaky in the legs to standing more than few minutes, has had PT in the past but felt if made everything worse. Living alone.  Denies worsening depression.  Does want further med for memory dysfxn that he finds more worsening subjectively recent Past Medical History:  Diagnosis Date  . Arthritis   . Chicken pox   . Depression   . Hypercholesteremia   . Hypertension   . Migraine   . PTSD (post-traumatic stress disorder) 06/24/2017  . Stroke (HCC)    No residual effects  . Syphilis   . Tuberculosis    Past Surgical History:  Procedure Laterality Date  . BACK SURGERY    . HERNIA REPAIR      reports that he has been smoking cigarettes.  He has a 25.00 pack-year smoking history. He has never used smokeless tobacco. He reports that he drinks about 1.2 - 1.8 oz of alcohol per week. He reports that he does not use drugs. family history includes Healthy in his father and  mother. No Known Allergies Current Outpatient Medications on File Prior to Visit  Medication Sig Dispense Refill  . aspirin 325 MG tablet Take 1 tablet (325 mg total) by mouth daily. 30 tablet 0  . atorvastatin (LIPITOR) 20 MG tablet Take 1 tablet (20 mg total) by mouth daily at 6 PM. 30 tablet 0  . buPROPion (ZYBAN) 150 MG 12 hr tablet Take 1 tablet by mouth daily for 3 days then 1 tablet by mouth twice daily. 180 tablet 3  . donepezil (ARICEPT) 10 MG tablet Take 1 tablet (10 mg total) by mouth at bedtime. 90 tablet 3  . gabapentin (NEURONTIN) 300 MG capsule Take 1 capsule (300 mg total) by mouth 3 (three) times daily. Must keep appt w/new provider for future refills 270 capsule 1  . hydrochlorothiazide (HYDRODIURIL) 12.5 MG tablet Take 1 tablet (12.5 mg total) by mouth daily. Must keep appt w/new provider for future refills 90 tablet 3  . meloxicam (MOBIC) 15 MG tablet TAKE ONE TABLET BY MOUTH  DAILY 90 tablet 3  . nicotine (NICODERM CQ) 21 mg/24hr patch Place 1 patch (21 mg total) onto the skin daily. 28 patch 0  . sertraline (ZOLOFT) 25 MG tablet Take 1 tablet (25 mg total) by mouth daily. Must keep appt w/new provider for future refills 90 tablet 3  . vitamin B-12 (CYANOCOBALAMIN) 100 MCG tablet Take 100 mcg by mouth daily.     No current facility-administered medications on file prior to visit.  Review of Systems Constitutional: Negative for other unusual diaphoresis, sweats, appetite or weight changes HENT: Negative for other worsening hearing loss, ear pain, facial swelling, mouth sores or neck stiffness.   Eyes: Negative for other worsening pain, redness or other visual disturbance.  Respiratory: Negative for other stridor or swelling Cardiovascular: Negative for other palpitations or other chest pain  Gastrointestinal: Negative for worsening diarrhea or loose stools, blood in stool, distention or other pain Genitourinary: Negative for hematuria, flank pain or other change in urine  volume.  Musculoskeletal: Negative for myalgias or other joint swelling.  Skin: Negative for other color change, or other wound or worsening drainage.  Neurological: Negative for other syncope or numbness. Hematological: Negative for other adenopathy or swelling Psychiatric/Behavioral: Negative for hallucinations, other worsening agitation, SI, self-injury, or new decreased concentration All other system neg per pt    Objective:   Physical Exam BP 112/72   Pulse 97   Temp 98.4 F (36.9 C) (Oral)   Ht  (1.753 m)   SpO2 100%   BMI 17.53 kg/m  VS noted,  Constitutional: Pt is oriented to person, place, and time. Appears well-developed and well-nourished, in no significant distress and comfortable Head: Normocephalic and atraumatic  Eyes: Conjunctivae and EOM are normal. Pupils are equal, round, and reactive to light Right Ear: External ear normal without discharge Left Ear: External ear normal without discharge Nose: Nose without discharge or deformity Mouth/Throat: Oropharynx is without other ulcerations and moist  Neck: Normal range of motion. Neck supple. No JVD present. No tracheal deviation present or significant neck LA or mass Cardiovascular: Normal rate, regular rhythm, normal heart sounds and intact distal pulses.   Pulmonary/Chest: WOB normal and breath sounds without rales or wheezing  Abdominal: Soft. Bowel sounds are normal. NT. No HSM  Musculoskeletal: Normal range of motion. Exhibits no edema Lymphadenopathy: Has no other cervical adenopathy.  Neurological: Pt is alert and oriented to person, place, and time. Pt has normal reflexes. No cranial nerve deficit. Motor grossly intact, Gait intact Skin: Skin is warm and dry. No rash noted or new ulcerations Psychiatric:  Has normal mood and affect. Behavior is normal without agitation No other exam findings  Lab Results  Component Value Date   WBC 7.4 10/16/2017   HGB 16.0 10/16/2017   HCT 47.0 10/16/2017   PLT  192 10/16/2017   GLUCOSE 89 10/16/2017   CHOL 172 10/17/2017   TRIG 77 10/17/2017   HDL 56 10/17/2017   LDLCALC 101 (H) 10/17/2017   ALT 16 (L) 10/16/2017   AST 29 10/16/2017   NA 139 10/16/2017   K 4.2 10/16/2017   CL 102 10/16/2017   CREATININE 0.90 10/16/2017   BUN 10 10/16/2017   CO2 22 10/16/2017   TSH 0.39 10/31/2015   PSA 2.78 04/28/2017   INR 0.99 10/16/2017   HGBA1C 5.1 10/17/2017   Declines further labs today     Assessment & Plan:

## 2018-01-28 ENCOUNTER — Ambulatory Visit: Payer: Medicare PPO | Admitting: Internal Medicine

## 2018-05-04 ENCOUNTER — Telehealth: Payer: Self-pay | Admitting: Internal Medicine

## 2018-05-04 NOTE — Telephone Encounter (Signed)
Returned call from [place], LVM.

## 2018-05-04 NOTE — Telephone Encounter (Signed)
Copied from CRM [id number]. Topic: Quick Communication - See Telephone Encounter >> May 04, 2018 12:47 PM Lorrine Kin, NT wrote: CRM for notification. See Telephone encounter for: 05/04/18. Shanice from [facility] calling and states that she did a home visit with the patient today and the med list that she received from the patient was from 2017. States that she is unsure if anything has changed or has been added. Would like a call to go over the patient's current medication list, Please advise.  CB#: [phone] (can leave a voicemail)

## 2018-05-04 NOTE — Telephone Encounter (Signed)
The fax number is [phone]

## 2018-05-04 NOTE — Telephone Encounter (Signed)
Faxed

## 2018-07-15 ENCOUNTER — Ambulatory Visit: Payer: Medicare PPO | Admitting: Internal Medicine

## 2018-07-15 DIAGNOSIS — Z0289 Encounter for other administrative examinations: Secondary | ICD-10-CM

## 2018-10-12 ENCOUNTER — Telehealth: Payer: Self-pay

## 2018-10-12 NOTE — Telephone Encounter (Signed)
Received a call from Roberto Wallace requesting an appointment with Roberto Eke, Roberto Wallace. Roberto Wallace states that Roberto Wallace was once his PCP and would like to return under his care. Informed patient that Roberto Eke, Roberto Wallace is now with a specialty office and would require a referral to be seen. Patient became frustrated and states that he should not need a referral to be seen since he has "a problem" . Verbalized to patient that we are unable to see him without a referral to make sure he is being seen by the appropriate office. Patient became more frustrated and states he will reach out to his social worker to handle this since he is too frustrated at the moment.   [name], [place]

## 2019-02-17 ENCOUNTER — Ambulatory Visit: Payer: Medicare PPO | Admitting: Internal Medicine

## 2019-02-18 IMAGING — CT CT ANGIO NECK
2 of 7 series · 8 of 33 positions shown · IV contrast (OMNI 350)
Comparison: Head CT earlier same day

CLINICAL DATA: Code stroke evaluation. Left arm numbness. Last seen
normal 2 hours ago. Negative head CT.

EXAM:
CT ANGIOGRAPHY HEAD AND NECK
TECHNIQUE: Multidetector CT imaging of the head and neck was performed using
the standard protocol during bolus administration of intravenous
contrast. Multiplanar CT image reconstructions and MIPs were
obtained to evaluate the vascular anatomy. Carotid stenosis
measurements (when applicable) are obtained utilizing NASCET
criteria, using the distal internal carotid diameter as the
denominator.
CONTRAST:  100mL GZY1WQ-HAP IOPAMIDOL (GZY1WQ-HAP) INJECTION 76%

[Series 5: cta neck · axial · 0.40mm/px · z∈[-166,-48]mm · 2 of 179 slices shown]
[im 60/179  soft-tissue]
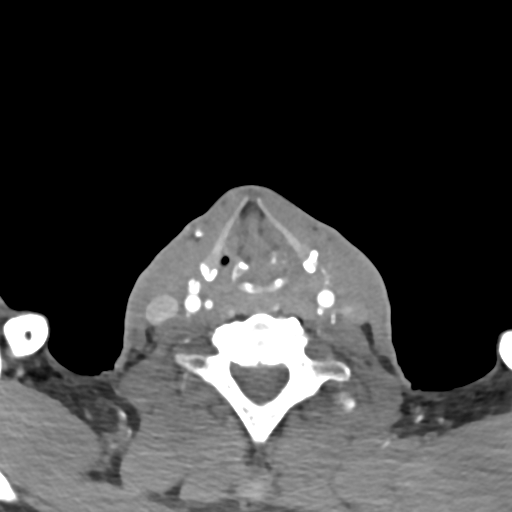
[im 119/179  soft-tissue]
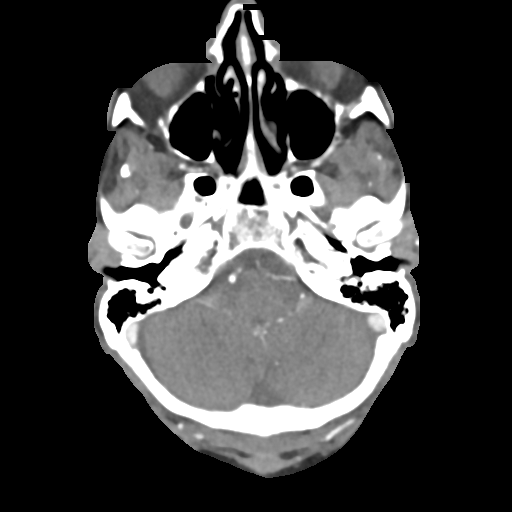

[Series 7: cta neck axial · axial · 0.39mm/px · z∈[-233,+20]mm · 6 of 355 slices shown]
[im 51/355  soft-tissue]
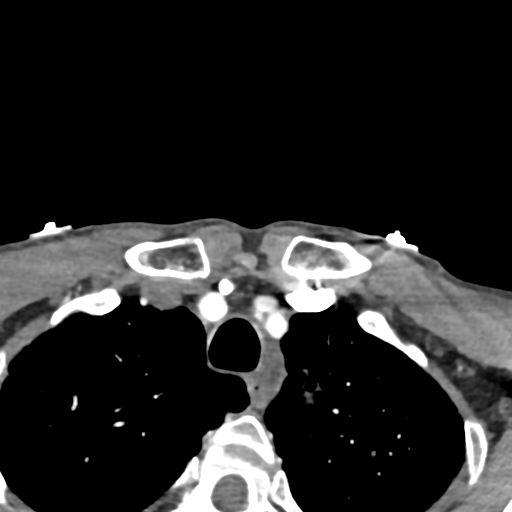
[im 102/355  bone]
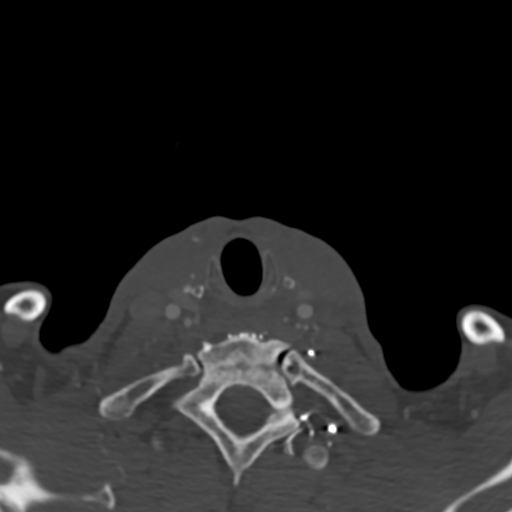
[im 152/355  soft-tissue]
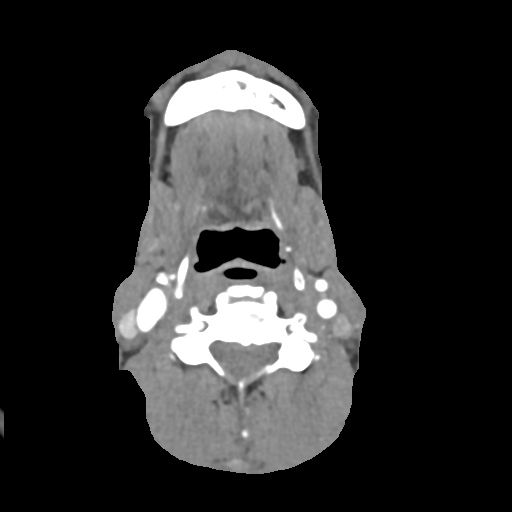
[im 203/355  bone]
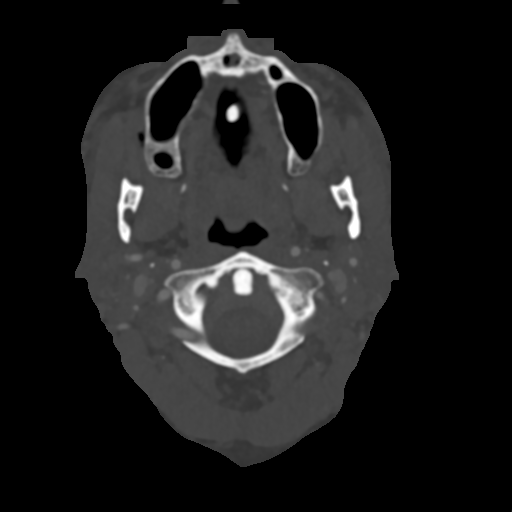
[im 253/355  soft-tissue]
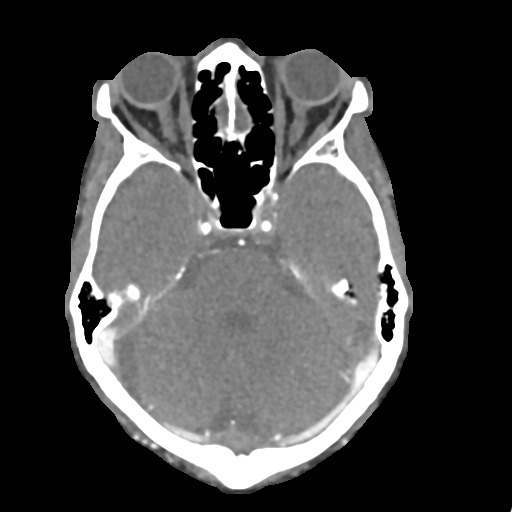
[im 304/355  bone]
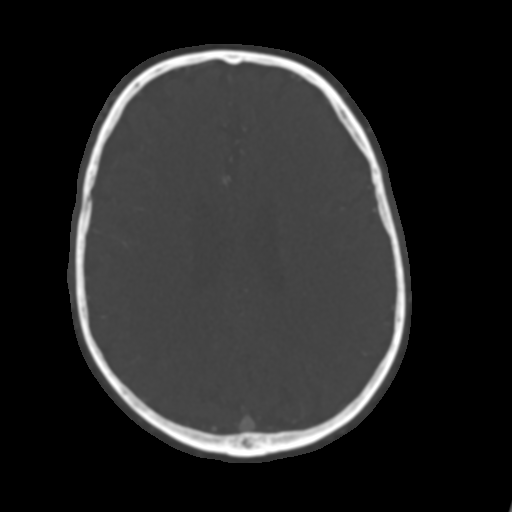

[8 of 33 positions shown; findings below may reference images not displayed]

FINDINGS: CTA NECK FINDINGS

Aortic arch: Aortic atherosclerosis. No aneurysm or dissection. Left
vertebral artery takes an anomalous origin from the arch.

Right carotid system: Common carotid artery widely patent to the
bifurcation region. There is atherosclerotic plaque at the carotid
bifurcation but no stenosis or irregularity. Cervical ICA is widely
patent.

Left carotid system: Common carotid artery widely patent to the
bifurcation region. Mild soft plaque at the bifurcation but no
stenosis or irregularity.

Vertebral arteries: Both vertebral artery origins are widely patent.
Both vertebral arteries are widely patent through the cervical
region. As noted above, left vertebral artery arises directly from
the arch and is a small vessel.

Skeleton: Degenerative spondylosis.  No acute bone finding.

Other neck: No mass or lymphadenopathy.

Upper chest: Scarring and emphysematous changes.  No acute finding.

Review of the MIP images confirms the above findings

CTA HEAD FINDINGS

Anterior circulation: Both internal carotid arteries are patent
through the skull base and siphon regions. Peripheral
atherosclerotic calcification in the carotid siphon regions but no
stenosis greater than about 20%. Anterior and middle cerebral
vessels are patent without proximal stenosis, aneurysm or vascular
malformation.

Posterior circulation: Both vertebral arteries are patent through
the foramen magnum with the right being larger than the left. Left
vertebral artery primarily ends in PICA, with a tiny contribution to
the basilar. No basilar stenosis. Posterior circulation branch
vessels are patent. PCAs receive primary supply from the anterior
circulation.

Venous sinuses: Patent and normal.

Anatomic variants: None significant

Delayed phase: No abnormal enhancement

Review of the MIP images confirms the above findings
IMPRESSION: No acute finding.  No emergent large vessel occlusion.

Mild atherosclerotic disease at the carotid bifurcations but without
stenosis or irregularity.

Left vertebral artery takes an anomalous origin from the arch. Mild
atherosclerotic change at the origin but without stenosis or
occlusion.

Ordinary atherosclerotic change in the carotid siphon regions
without evidence of flow limiting stenosis. No intracranial large or
medium vessel abnormality seen.

## 2019-03-10 ENCOUNTER — Ambulatory Visit (INDEPENDENT_AMBULATORY_CARE_PROVIDER_SITE_OTHER)
Admission: RE | Admit: 2019-03-10 | Discharge: 2019-03-10 | Disposition: A | Discharge status: Home / Self Care | Payer: Medicare PPO | Source: Ambulatory Visit | Attending: Internal Medicine | Admitting: Internal Medicine

## 2019-03-10 ENCOUNTER — Encounter: Payer: Self-pay | Admitting: Internal Medicine

## 2019-03-10 ENCOUNTER — Other Ambulatory Visit (INDEPENDENT_AMBULATORY_CARE_PROVIDER_SITE_OTHER): Payer: Medicare PPO

## 2019-03-10 ENCOUNTER — Ambulatory Visit (INDEPENDENT_AMBULATORY_CARE_PROVIDER_SITE_OTHER): Payer: Medicare PPO | Admitting: Internal Medicine

## 2019-03-10 ENCOUNTER — Other Ambulatory Visit: Payer: Self-pay

## 2019-03-10 VITALS — BP 126/84 | HR 98 | Temp 97.9°F | Ht 69.0 in

## 2019-03-10 DIAGNOSIS — E611 Iron deficiency: Secondary | ICD-10-CM | POA: Diagnosis not present

## 2019-03-10 DIAGNOSIS — I10 Essential (primary) hypertension: Secondary | ICD-10-CM | POA: Diagnosis not present

## 2019-03-10 DIAGNOSIS — E559 Vitamin D deficiency, unspecified: Secondary | ICD-10-CM

## 2019-03-10 DIAGNOSIS — G629 Polyneuropathy, unspecified: Secondary | ICD-10-CM | POA: Diagnosis not present

## 2019-03-10 DIAGNOSIS — Z77090 Contact with and (suspected) exposure to asbestos: Secondary | ICD-10-CM

## 2019-03-10 DIAGNOSIS — R059 Cough, unspecified: Secondary | ICD-10-CM

## 2019-03-10 DIAGNOSIS — R05 Cough: Secondary | ICD-10-CM

## 2019-03-10 DIAGNOSIS — R739 Hyperglycemia, unspecified: Secondary | ICD-10-CM | POA: Insufficient documentation

## 2019-03-10 DIAGNOSIS — Z0001 Encounter for general adult medical examination with abnormal findings: Secondary | ICD-10-CM

## 2019-03-10 DIAGNOSIS — F17200 Nicotine dependence, unspecified, uncomplicated: Secondary | ICD-10-CM | POA: Diagnosis not present

## 2019-03-10 DIAGNOSIS — F32A Depression, unspecified: Secondary | ICD-10-CM

## 2019-03-10 DIAGNOSIS — F329 Major depressive disorder, single episode, unspecified: Secondary | ICD-10-CM

## 2019-03-10 DIAGNOSIS — F419 Anxiety disorder, unspecified: Secondary | ICD-10-CM

## 2019-03-10 DIAGNOSIS — E538 Deficiency of other specified B group vitamins: Secondary | ICD-10-CM

## 2019-03-10 DIAGNOSIS — F0390 Unspecified dementia without behavioral disturbance: Secondary | ICD-10-CM

## 2019-03-10 DIAGNOSIS — F03A0 Unspecified dementia, mild, without behavioral disturbance, psychotic disturbance, mood disturbance, and anxiety: Secondary | ICD-10-CM

## 2019-03-10 LAB — BASIC METABOLIC PANEL
BUN: 10 mg/dL (ref 6–23)
CO2: 24 mEq/L (ref 19–32)
Calcium: 9 mg/dL (ref 8.4–10.5)
Chloride: 102 mEq/L (ref 96–112)
Creatinine, Ser: 0.76 mg/dL (ref 0.40–1.50)
GFR: 120.8 mL/min (ref >60.00)
Glucose, Bld: 89 mg/dL (ref 70–99)
Potassium: 3.8 mEq/L (ref 3.5–5.1)
Sodium: 138 mEq/L (ref 135–145)

## 2019-03-10 LAB — CBC WITH DIFFERENTIAL/PLATELET
Basophils Absolute: 0 10*3/uL (ref 0.0–0.1)
Basophils Relative: 0.2 % (ref 0.0–3.0)
Eosinophils Absolute: 0.2 10*3/uL (ref 0.0–0.7)
Eosinophils Relative: 4.9 % (ref 0.0–5.0)
HCT: 45.8 % (ref 39.0–52.0)
Hemoglobin: 15.3 g/dL (ref 13.0–17.0)
Lymphocytes Relative: 27.1 % (ref 12.0–46.0)
Lymphs Abs: 1.3 10*3/uL (ref 0.7–4.0)
MCHC: 33.4 g/dL (ref 30.0–36.0)
MCV: 92.3 fl (ref 78.0–100.0)
Monocytes Absolute: 0.5 10*3/uL (ref 0.1–1.0)
Monocytes Relative: 9.2 % (ref 3.0–12.0)
Neutro Abs: 2.9 10*3/uL (ref 1.4–7.7)
Neutrophils Relative %: 58.6 % (ref 43.0–77.0)
Platelets: 146 10*3/uL — ABNORMAL LOW (ref 150.0–400.0)
RBC: 4.96 Mil/uL (ref 4.22–5.81)
RDW: 13.4 % (ref 11.5–15.5)
WBC: 5 10*3/uL (ref 4.0–10.5)

## 2019-03-10 LAB — HEPATIC FUNCTION PANEL
ALT: 17 U/L (ref 0–53)
AST: 18 U/L (ref 0–37)
Albumin: 4.3 g/dL (ref 3.5–5.2)
Alkaline Phosphatase: 81 U/L (ref 39–117)
Bilirubin, Direct: 0.2 mg/dL (ref 0.0–0.3)
Total Bilirubin: 0.8 mg/dL (ref 0.2–1.2)
Total Protein: 7.4 g/dL (ref 6.0–8.3)

## 2019-03-10 LAB — LIPID PANEL
Cholesterol: 178 mg/dL (ref 0–200)
HDL: 65.8 mg/dL (ref >39.00)
LDL Cholesterol: 100 mg/dL — ABNORMAL HIGH (ref 0–99)
NonHDL: 112.47
Total CHOL/HDL Ratio: 3
Triglycerides: 63 mg/dL (ref 0.0–149.0)
VLDL: 12.6 mg/dL (ref 0.0–40.0)

## 2019-03-10 LAB — VITAMIN D 25 HYDROXY (VIT D DEFICIENCY, FRACTURES): VITD: 16.69 ng/mL — ABNORMAL LOW (ref 30.00–100.00)

## 2019-03-10 LAB — IBC PANEL
Iron: 131 ug/dL (ref 42–165)
Saturation Ratios: 39.3 % (ref 20.0–50.0)
Transferrin: 238 mg/dL (ref 212.0–360.0)

## 2019-03-10 LAB — PSA: PSA: 2.97 ng/mL (ref 0.10–4.00)

## 2019-03-10 LAB — HEMOGLOBIN A1C: Hgb A1c MFr Bld: 5.3 % (ref 4.6–6.5)

## 2019-03-10 LAB — TSH: TSH: 0.42 u[IU]/mL (ref 0.35–4.50)

## 2019-03-10 LAB — VITAMIN B12: Vitamin B-12: 198 pg/mL — ABNORMAL LOW (ref 211–911)

## 2019-03-10 MED ORDER — GABAPENTIN 300 MG PO CAPS: 300.0000 mg | ORAL_CAPSULE | Freq: Three times a day (TID) | ORAL | 3 refills | Status: DC

## 2019-03-10 MED ORDER — DONEPEZIL HCL 10 MG PO TABS: 10.0000 mg | ORAL_TABLET | Freq: Every day | ORAL | 3 refills | Status: DC

## 2019-03-10 MED ORDER — ATORVASTATIN CALCIUM 20 MG PO TABS: 20.0000 mg | ORAL_TABLET | Freq: Every day | ORAL | 3 refills | Status: DC

## 2019-03-10 MED ORDER — SERTRALINE HCL 25 MG PO TABS: 25.0000 mg | ORAL_TABLET | Freq: Every day | ORAL | 3 refills | Status: DC

## 2019-03-10 MED ORDER — MELOXICAM 15 MG PO TABS: ORAL_TABLET | ORAL | 3 refills | Status: DC

## 2019-03-10 MED ORDER — HYDROCHLOROTHIAZIDE 12.5 MG PO TABS: 12.5000 mg | ORAL_TABLET | Freq: Every day | ORAL | 3 refills | Status: DC

## 2019-03-10 MED ORDER — MEMANTINE HCL 5 MG PO TABS: 5.0000 mg | ORAL_TABLET | Freq: Two times a day (BID) | ORAL | 3 refills | Status: DC

## 2019-03-10 NOTE — Patient Instructions (Signed)
Please continue all other medications as before, and refills have been done if requested.  Please have the pharmacy call with any other refills you may need.  Please continue your efforts at being more active, low cholesterol diet, and weight control.  You are otherwise up to date with prevention measures today.  Please keep your appointments with your specialists as you may have planned  Please go to the XRAY Department in the Basement (go straight as you get off the elevator) for the x-ray testing  Please go to the LAB in the Basement (turn left off the elevator) for the tests to be done today  You will be contacted by phone if any changes need to be made immediately.  Otherwise, you will receive a letter about your results with an explanation, but please check with MyChart first.  Please remember to sign up for MyChart if you have not done so, as this will be important to you in the future with finding out test results, communicating by private email, and scheduling acute appointments online when needed.  Please return in 6 months, or sooner if needed 

## 2019-03-10 NOTE — Progress Notes (Signed)
Subjective:    Patient ID: Roberto Felt., male    DOB: 18-Apr-1941, 78 y.o.   MRN: [id number]  HPI  Here for wellness and f/u;  Overall doing ok;  Pt denies neurological change such as new headache, facial or extremity weakness.  Pt denies polydipsia, polyuria, or low sugar symptoms. Pt states overall good compliance with treatment and medications, good tolerability, and has been trying to follow appropriate diet.. No fever, night sweats, wt loss, loss of appetite, or other constitutional symptoms.  Pt states good ability with ADL's, has low fall risk, home safety reviewed and adequate, no other significant changes in hearing or vision, and only occasionally active with exercise. Has fallen several times recently he attributes to poor vision, last eye exam years ago at the [place], lost glasses yr.   Also c/o chronic cough x 6 mo, non prod without fever,wt loss, hemoptysis and Pt denies chest pain, increased sob or doe, wheezing, orthopnea, PND, increased LE swelling, palpitations, dizziness or syncope.  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  Denies worsening depressive symptoms, suicidal ideation, or panic; has ongoing anxiety. Past Medical History:  Diagnosis Date   Arthritis    Chicken pox    Depression    Hypercholesteremia    Hypertension    Migraine    PTSD (post-traumatic stress disorder) 06/24/2017   Stroke ([place])    No residual effects   Syphilis    Tuberculosis    Past Surgical History:  Procedure Laterality Date   BACK SURGERY     HERNIA REPAIR      reports that he has been smoking cigarettes. He has a 25.00 pack-year smoking history. He has  never used smokeless tobacco. He reports current alcohol use of about 2.0 - 3.0 standard drinks of alcohol per week. He reports that he does not use drugs. family history includes Healthy in his father and mother. No Known Allergies Current Outpatient Medications on File Prior to Visit  Medication Sig Dispense Refill   aspirin 325 MG tablet Take 1 tablet (325 mg total) by mouth daily. 30 tablet 0   buPROPion (ZYBAN) 150 MG 12 hr tablet Take 1 tablet by mouth daily for 3 days then 1 tablet by mouth twice daily. 180 tablet 3   nicotine (NICODERM CQ) 21 mg/24hr patch Place 1 patch (21 mg total) onto the skin daily. 28 patch 0   vitamin B-12 (CYANOCOBALAMIN) 100 MCG tablet Take 100 mcg by mouth daily.     No current facility-administered medications on file prior to visit.    Review of Systems Constitutional: Negative for other unusual diaphoresis, sweats, appetite or weight changes HENT: Negative for other worsening hearing loss, ear pain, facial swelling, mouth sores or neck stiffness.   Eyes: Negative for other worsening pain, redness or other visual disturbance.  Respiratory: Negative for other stridor or swelling Cardiovascular: Negative for other palpitations or other chest pain  Gastrointestinal: Negative for worsening diarrhea or loose stools, blood in stool, distention or other pain Genitourinary: Negative for hematuria, flank pain or other change in urine volume.  Musculoskeletal: Negative for myalgias or other joint swelling.  Skin: Negative for other color change, or other wound  or worsening drainage.  Neurological: Negative for other syncope or numbness. Hematological: Negative for other adenopathy or swelling Psychiatric/Behavioral: Negative for hallucinations, other worsening agitation, SI, self-injury, or new decreased concentration All other system neg per pt    Objective:   Physical Exam BP 126/84    Pulse 98    Temp 97.9 F (36.6 C) (Oral)    Ht 5\' 9"  (1.753 m)     SpO2 99%    BMI 17.53 kg/m  Walks with cane VS noted,  Constitutional: Pt is oriented to person, place, and time. Appears well-developed and well-nourished, in no significant distress and comfortable Head: Normocephalic and atraumatic  Eyes: Conjunctivae and EOM are normal. Pupils are equal, round, and reactive to light Right Ear: External ear normal without discharge Left Ear: External ear normal without discharge Nose: Nose without discharge or deformity Mouth/Throat: Oropharynx is without other ulcerations and moist  Neck: Normal range of motion. Neck supple. No JVD present. No tracheal deviation present or significant neck LA or mass Cardiovascular: Normal rate, regular rhythm, normal heart sounds and intact distal pulses.   Pulmonary/Chest: WOB normal and breath sounds without rales or wheezing  Abdominal: Soft. Bowel sounds are normal. NT. No HSM  Musculoskeletal: Normal range of motion. Exhibits no edema Lymphadenopathy: Has no other cervical adenopathy.  Neurological: Pt is alert and oriented to person, place, and time. Pt has normal reflexes. No cranial nerve deficit. Motor grossly intact, Gait intact Skin: Skin is warm and dry. No rash noted or new ulcerations Psychiatric:  Has nervous depressed mood and affect. Behavior is normal without agitation No other exam findings  Lab Results  Component Value Date   WBC 7.4 10/16/2017   HGB 16.0 10/16/2017   HCT 47.0 10/16/2017   PLT 192 10/16/2017   GLUCOSE 89 10/16/2017   CHOL 172 10/17/2017   TRIG 77 10/17/2017   HDL 56 10/17/2017   LDLCALC 101 (H) 10/17/2017   ALT 16 (L) 10/16/2017   AST 29 10/16/2017   NA 139 10/16/2017   K 4.2 10/16/2017   CL 102 10/16/2017   CREATININE 0.90 10/16/2017   BUN 10 10/16/2017   CO2 22 10/16/2017   TSH 0.39 10/31/2015   PSA 2.78 04/28/2017   INR 0.99 10/16/2017   HGBA1C 5.1 10/17/2017      Assessment & Plan:

## 2019-03-11 LAB — URINALYSIS, ROUTINE W REFLEX MICROSCOPIC
Bilirubin Urine: NEGATIVE
Hgb urine dipstick: NEGATIVE
Leukocytes,Ua: NEGATIVE
Nitrite: NEGATIVE
Specific Gravity, Urine: 1.025 (ref 1.000–1.030)
Total Protein, Urine: NEGATIVE
Urine Glucose: NEGATIVE
Urobilinogen, UA: 0.2 (ref 0.0–1.0)
pH: 6 (ref 5.0–8.0)

## 2019-03-12 ENCOUNTER — Encounter: Payer: Self-pay | Admitting: Internal Medicine

## 2019-03-12 ENCOUNTER — Other Ambulatory Visit: Payer: Self-pay | Admitting: Internal Medicine

## 2019-03-12 DIAGNOSIS — E559 Vitamin D deficiency, unspecified: Secondary | ICD-10-CM | POA: Insufficient documentation

## 2019-03-12 DIAGNOSIS — R3129 Other microscopic hematuria: Secondary | ICD-10-CM

## 2019-03-12 DIAGNOSIS — E538 Deficiency of other specified B group vitamins: Secondary | ICD-10-CM | POA: Insufficient documentation

## 2019-03-12 MED ORDER — VITAMIN D (ERGOCALCIFEROL) 1.25 MG (50000 UNIT) PO CAPS: 50000.0000 [IU] | ORAL_CAPSULE | ORAL | 0 refills | Status: DC

## 2019-03-13 ENCOUNTER — Encounter: Payer: Self-pay | Admitting: Internal Medicine

## 2019-03-13 NOTE — Assessment & Plan Note (Signed)
stable overall by history and exam, recent data reviewed with pt, and pt to continue medical treatment as before,  to f/u any worsening symptoms or concerns  

## 2019-03-13 NOTE — Assessment & Plan Note (Signed)
Urged to quit, not ready per pt

## 2019-03-13 NOTE — Assessment & Plan Note (Signed)
Stable, to continue the gabapentin 

## 2019-03-13 NOTE — Assessment & Plan Note (Signed)

## 2019-03-13 NOTE — Assessment & Plan Note (Signed)
Also for vi d level, may need replacement

## 2019-03-13 NOTE — Assessment & Plan Note (Signed)
For cxr 

## 2019-03-13 NOTE — Assessment & Plan Note (Addendum)
Etiology unclear, no obvious allergy asthma or reflux issues; for cxr, consider pulm referrral  In addition to the time spent performing CPE, I spent an additional 40 minutes face to face,in which greater than 50% of this time was spent in counseling and coordination of care for patient's acute illness as documented, including the differential dx, treatment, further evaluation and other management of cough, htn, neuropathy, hyperglycemia, dementia, anxiety depression, b12 def, vit d def, tobacco abuse

## 2019-03-15 ENCOUNTER — Telehealth: Payer: Self-pay

## 2019-03-15 NOTE — Telephone Encounter (Signed)
Called pt, LVM.   CRM created.  

## 2019-03-15 NOTE — Telephone Encounter (Signed)
-----   Message from [name], MD sent at 03/12/2019  1:54 PM EDT ----- Letter sent, cont same tx except  The test results show that your current treatment is OK, as the tests are stable, except the Vitamin B12 is low, the Vitamin D level is low, and there is a very small amount of blood in the urine testing.    We need to: 1)  Start monthly B12 shots until your next visit 2)  Please take Vitamin D 50000 units weekly for 12 weeks, then plan to change to OTC Vitamin D3 at 2000 units per day, indefinitely. 3)  Refer to Urology for further blood in the urine consideration.    Thane Age to please inform pt, start monthly b12 shots, I will do referral and rx

## 2019-07-25 ENCOUNTER — Encounter: Payer: Self-pay | Admitting: Internal Medicine

## 2019-10-04 ENCOUNTER — Ambulatory Visit: Payer: Medicare PPO | Admitting: Internal Medicine

## 2020-01-25 ENCOUNTER — Ambulatory Visit: Payer: Medicare PPO | Admitting: Internal Medicine

## 2020-02-01 ENCOUNTER — Encounter: Payer: Self-pay | Admitting: Internal Medicine

## 2020-02-01 ENCOUNTER — Other Ambulatory Visit: Payer: Self-pay

## 2020-02-01 ENCOUNTER — Ambulatory Visit (INDEPENDENT_AMBULATORY_CARE_PROVIDER_SITE_OTHER): Payer: Medicare PPO | Admitting: Internal Medicine

## 2020-02-01 VITALS — BP 140/82 | HR 103 | Temp 98.4°F | Ht 69.0 in | Wt 108.0 lb

## 2020-02-01 DIAGNOSIS — E785 Hyperlipidemia, unspecified: Secondary | ICD-10-CM

## 2020-02-01 DIAGNOSIS — E559 Vitamin D deficiency, unspecified: Secondary | ICD-10-CM

## 2020-02-01 DIAGNOSIS — E538 Deficiency of other specified B group vitamins: Secondary | ICD-10-CM | POA: Diagnosis not present

## 2020-02-01 DIAGNOSIS — R739 Hyperglycemia, unspecified: Secondary | ICD-10-CM

## 2020-02-01 DIAGNOSIS — G629 Polyneuropathy, unspecified: Secondary | ICD-10-CM

## 2020-02-01 DIAGNOSIS — Z860100 Personal history of colon polyps, unspecified: Secondary | ICD-10-CM | POA: Insufficient documentation

## 2020-02-01 DIAGNOSIS — I10 Essential (primary) hypertension: Secondary | ICD-10-CM

## 2020-02-01 DIAGNOSIS — J309 Allergic rhinitis, unspecified: Secondary | ICD-10-CM | POA: Diagnosis not present

## 2020-02-01 DIAGNOSIS — R3129 Other microscopic hematuria: Secondary | ICD-10-CM

## 2020-02-01 DIAGNOSIS — Z0001 Encounter for general adult medical examination with abnormal findings: Secondary | ICD-10-CM | POA: Diagnosis not present

## 2020-02-01 LAB — CBC WITH DIFFERENTIAL/PLATELET
Basophils Absolute: 0 10*3/uL (ref 0.0–0.1)
Basophils Relative: 0.8 % (ref 0.0–3.0)
Eosinophils Absolute: 0.3 10*3/uL (ref 0.0–0.7)
Eosinophils Relative: 4.6 % (ref 0.0–5.0)
HCT: 44.4 % (ref 39.0–52.0)
Hemoglobin: 14.7 g/dL (ref 13.0–17.0)
Lymphocytes Relative: 22.4 % (ref 12.0–46.0)
Lymphs Abs: 1.3 10*3/uL (ref 0.7–4.0)
MCHC: 33 g/dL (ref 30.0–36.0)
MCV: 93.5 fl (ref 78.0–100.0)
Monocytes Absolute: 0.5 10*3/uL (ref 0.1–1.0)
Monocytes Relative: 8.5 % (ref 3.0–12.0)
Neutro Abs: 3.6 10*3/uL (ref 1.4–7.7)
Neutrophils Relative %: 63.7 % (ref 43.0–77.0)
Platelets: 176 10*3/uL (ref 150.0–400.0)
RBC: 4.75 Mil/uL (ref 4.22–5.81)
RDW: 13.1 % (ref 11.5–15.5)
WBC: 5.6 10*3/uL (ref 4.0–10.5)

## 2020-02-01 LAB — LIPID PANEL
Cholesterol: 196 mg/dL (ref 0–200)
HDL: 56.3 mg/dL (ref >39.00)
LDL Cholesterol: 124 mg/dL — ABNORMAL HIGH (ref 0–99)
NonHDL: 139.47
Total CHOL/HDL Ratio: 3
Triglycerides: 76 mg/dL (ref 0.0–149.0)
VLDL: 15.2 mg/dL (ref 0.0–40.0)

## 2020-02-01 LAB — HEPATIC FUNCTION PANEL
ALT: 11 U/L (ref 0–53)
AST: 15 U/L (ref 0–37)
Albumin: 4.2 g/dL (ref 3.5–5.2)
Alkaline Phosphatase: 71 U/L (ref 39–117)
Bilirubin, Direct: 0.1 mg/dL (ref 0.0–0.3)
Total Bilirubin: 0.5 mg/dL (ref 0.2–1.2)
Total Protein: 7.3 g/dL (ref 6.0–8.3)

## 2020-02-01 LAB — URINALYSIS, ROUTINE W REFLEX MICROSCOPIC
Bilirubin Urine: NEGATIVE
Ketones, ur: NEGATIVE
Leukocytes,Ua: NEGATIVE
Nitrite: NEGATIVE
Specific Gravity, Urine: >=1.03 — AB (ref 1.000–1.030)
Urine Glucose: NEGATIVE
Urobilinogen, UA: 0.2 (ref 0.0–1.0)
pH: 6 (ref 5.0–8.0)

## 2020-02-01 LAB — BASIC METABOLIC PANEL
BUN: 9 mg/dL (ref 6–23)
CO2: 27 mEq/L (ref 19–32)
Calcium: 9.1 mg/dL (ref 8.4–10.5)
Chloride: 104 mEq/L (ref 96–112)
Creatinine, Ser: 0.79 mg/dL (ref 0.40–1.50)
GFR: 115.24 mL/min (ref >60.00)
Glucose, Bld: 102 mg/dL — ABNORMAL HIGH (ref 70–99)
Potassium: 4.1 mEq/L (ref 3.5–5.1)
Sodium: 140 mEq/L (ref 135–145)

## 2020-02-01 LAB — TSH: TSH: 0.61 u[IU]/mL (ref 0.35–4.50)

## 2020-02-01 LAB — PSA: PSA: 2.02 ng/mL (ref 0.10–4.00)

## 2020-02-01 LAB — HEMOGLOBIN A1C: Hgb A1c MFr Bld: 5.8 % (ref 4.6–6.5)

## 2020-02-01 LAB — VITAMIN D 25 HYDROXY (VIT D DEFICIENCY, FRACTURES): VITD: 26.24 ng/mL — ABNORMAL LOW (ref 30.00–100.00)

## 2020-02-01 LAB — VITAMIN B12: Vitamin B-12: 166 pg/mL — ABNORMAL LOW (ref 211–911)

## 2020-02-01 MED ORDER — MEMANTINE HCL 5 MG PO TABS: 5.0000 mg | ORAL_TABLET | Freq: Two times a day (BID) | ORAL | 3 refills | Status: DC

## 2020-02-01 MED ORDER — SERTRALINE HCL 25 MG PO TABS: 25.0000 mg | ORAL_TABLET | Freq: Every day | ORAL | 3 refills | Status: DC

## 2020-02-01 MED ORDER — FEXOFENADINE HCL 180 MG PO TABS: 180.0000 mg | ORAL_TABLET | Freq: Every day | ORAL | 3 refills | Status: DC

## 2020-02-01 MED ORDER — ATORVASTATIN CALCIUM 20 MG PO TABS: 20.0000 mg | ORAL_TABLET | Freq: Every day | ORAL | 3 refills | Status: DC

## 2020-02-01 MED ORDER — BUPROPION HCL ER (SMOKING DET) 150 MG PO TB12: ORAL_TABLET | ORAL | 3 refills | Status: DC

## 2020-02-01 MED ORDER — GABAPENTIN 300 MG PO CAPS: 300.0000 mg | ORAL_CAPSULE | Freq: Three times a day (TID) | ORAL | 3 refills | Status: DC

## 2020-02-01 MED ORDER — HYDROCHLOROTHIAZIDE 25 MG PO TABS: 25.0000 mg | ORAL_TABLET | Freq: Every day | ORAL | 3 refills | Status: DC

## 2020-02-01 MED ORDER — DONEPEZIL HCL 10 MG PO TABS: 10.0000 mg | ORAL_TABLET | Freq: Every day | ORAL | 3 refills | Status: DC

## 2020-02-01 NOTE — Patient Instructions (Addendum)
Please take all new medication as prescribed - the allegra for allergies  You will be contacted regarding the referral for: Urology for the blood in the urine, and Gastroenterology for the polyps  Ok to increase the hydrochlorothiazide to 25 mg per day for blood pressure  Please continue all other medications as before  Please have the pharmacy call with any other refills you may need.  Please continue your efforts at being more active, low cholesterol diet, and weight control.  You are otherwise up to date with prevention measures today.  Please keep your appointments with your specialists as you may have planned  Please go to the LAB at the blood drawing area for the tests to be done  You will be contacted by phone if any changes need to be made immediately.  Otherwise, you will receive a letter about your results with an explanation, but please check with MyChart first.  Please remember to sign up for MyChart if you have not done so, as this will be important to you in the future with finding out test results, communicating by private email, and scheduling acute appointments online when needed.  Please make an Appointment to return in 6 months, or sooner if needed

## 2020-02-01 NOTE — Progress Notes (Signed)
Subjective:    Patient ID: Roberto Ros., male    DOB: [birth year], 79 y.o.   MRN: [id number]  HPI  Here for wellness and f/u;  Overall doing ok;  Pt denies Chest pain, worsening SOB, DOE, wheezing, orthopnea, PND, worsening LE edema, palpitations, dizziness or syncope.  Pt denies neurological change such as new headache, facial or extremity weakness.  Pt denies polydipsia, polyuria, or low sugar symptoms. Pt states overall good compliance with treatment and medications, good tolerability, and has been trying to follow appropriate diet.  Pt denies worsening depressive symptoms, suicidal ideation or panic. No fever, night sweats, wt loss, loss of appetite, or other constitutional symptoms.  Pt states good ability with ADL's, has low fall risk, home safety reviewed and adequate, no other significant changes in hearing or vision, and only occasionally active with exercise.  Denies urinary symptoms such as dysuria, frequency, urgency, flank pain, hematuria or n/v, fever, chills. Has not b/u with urology Also, Does have several wks ongoing nasal allergy symptoms with clearish congestion, itch and sneezing, without fever, pain, ST, cough, swelling or wheezing. Past Medical History:  Diagnosis Date  . Arthritis   . Chicken pox   . Depression   . Hypercholesteremia   . Hypertension   . Migraine   . PTSD (post-traumatic stress disorder) 06/24/2017  . Stroke ([facility])    No residual effects  . Syphilis   . Tuberculosis    Past Surgical History:  Procedure Laterality Date  . BACK SURGERY    . HERNIA REPAIR      reports that he has been smoking cigarettes. He has a 25.00 pack-year smoking history. He has never used smokeless tobacco. He reports current alcohol use of about 2.0 - 3.0 standard drinks of alcohol per week. He reports that he does not use drugs. family history includes Healthy in his father and mother. No Known Allergies Current Outpatient Medications on File Prior to Visit   Medication Sig Dispense Refill  . aspirin 325 MG tablet Take 1 tablet (325 mg total) by mouth daily. 30 tablet 0  . meloxicam (MOBIC) 15 MG tablet TAKE ONE TABLET BY MOUTH  DAILY 90 tablet 3  . nicotine (NICODERM CQ) 21 mg/24hr patch Place 1 patch (21 mg total) onto the skin daily. 28 patch 0   No current facility-administered medications on file prior to visit.   Review of Systems All otherwise neg per pt    Objective:   Physical Exam BP 140/82 (BP Location: Left Arm, Patient Position: Sitting, Cuff Size: Large)   Pulse (!) 103   Temp 98.4 F (36.9 C) (Oral)   Ht 5\' 9"  (1.753 m)   Wt 108 lb (49 kg)   SpO2 97%   BMI 15.95 kg/m  VS noted,  Constitutional: Pt appears in NAD HENT: Head: NCAT.  Right Ear: External ear normal.  Left Ear: External ear normal.  Eyes: . Pupils are equal, round, and reactive to light. Conjunctivae and EOM are normal Nose: without d/c or deformity Neck: Neck supple. Gross normal ROM Cardiovascular: Normal rate and regular rhythm.   Pulmonary/Chest: Effort normal and breath sounds without rales or wheezing.  Abd:  Soft, NT, ND, + BS, no organomegaly Neurological: Pt is alert. At baseline orientation, motor grossly intact Skin: Skin is warm. No rashes, other new lesions, no LE edema Psychiatric: Pt behavior is normal without agitation  All otherwise neg per pt  Lab Results  Component Value Date   WBC 5.6 02/01/2020  HGB 14.7 02/01/2020   HCT 44.4 02/01/2020   PLT 176.0 02/01/2020   GLUCOSE 102 (H) 02/01/2020   CHOL 196 02/01/2020   TRIG 76.0 02/01/2020   HDL 56.30 02/01/2020   LDLCALC 124 (H) 02/01/2020   ALT 11 02/01/2020   AST 15 02/01/2020   NA 140 02/01/2020   K 4.1 02/01/2020   CL 104 02/01/2020   CREATININE 0.79 02/01/2020   BUN 9 02/01/2020   CO2 27 02/01/2020   TSH 0.61 02/01/2020   PSA 2.02 02/01/2020   INR 0.99 10/16/2017   HGBA1C 5.8 02/01/2020      Assessment & Plan:

## 2020-02-04 ENCOUNTER — Other Ambulatory Visit: Payer: Self-pay | Admitting: Internal Medicine

## 2020-02-04 ENCOUNTER — Encounter: Payer: Self-pay | Admitting: Internal Medicine

## 2020-02-04 MED ORDER — VITAMIN B-12 100 MCG PO TABS: 100.0000 ug | ORAL_TABLET | Freq: Every day | ORAL | 3 refills | Status: DC

## 2020-02-04 MED ORDER — VITAMIN D (ERGOCALCIFEROL) 1.25 MG (50000 UNIT) PO CAPS: 50000.0000 [IU] | ORAL_CAPSULE | ORAL | 0 refills | Status: DC

## 2020-02-05 ENCOUNTER — Encounter: Payer: Self-pay | Admitting: Internal Medicine

## 2020-02-05 NOTE — Assessment & Plan Note (Signed)

## 2020-02-05 NOTE — Assessment & Plan Note (Signed)
Asympt, for urology referral 

## 2020-02-05 NOTE — Assessment & Plan Note (Signed)
Cont oral replacement 

## 2020-02-05 NOTE — Assessment & Plan Note (Signed)
stable overall by history and exam, recent data reviewed with pt, and pt to continue medical treatment as before,  to f/u any worsening symptoms or concerns  

## 2020-02-05 NOTE — Assessment & Plan Note (Signed)
For gi refrrral

## 2020-02-05 NOTE — Assessment & Plan Note (Signed)
For allegra qd prn  I spent 31 minutes in addition to time for CPX wellness examination in preparing to see the patient by review of recent labs, imaging and procedures, obtaining and reviewing separately obtained history, communicating with the patient and family or caregiver, ordering medications, tests or procedures, and documenting clinical information in the EHR including the differential Dx, treatment, and any further evaluation and other management of allergies, hyperglycemia, hld, htn, hx of polyp, microhematuria, vit d and b12 deficiency

## 2020-02-05 NOTE — Assessment & Plan Note (Signed)
For cont oral replacement 

## 2020-02-21 ENCOUNTER — Telehealth: Payer: Self-pay

## 2020-02-21 NOTE — Telephone Encounter (Signed)
New message   The patient is asking the CMA to call him to discuss issues with his power wheelchair.

## 2020-02-22 NOTE — Telephone Encounter (Signed)
Tried to call pt, vm box has not been set up. Unable to leave a message.

## 2020-02-24 NOTE — Telephone Encounter (Signed)
Tried calling pt again and there was no answer. Pt vm has not been set up.

## 2020-03-08 ENCOUNTER — Encounter: Payer: Self-pay | Admitting: Gastroenterology

## 2020-05-07 ENCOUNTER — Encounter: Payer: Self-pay | Admitting: Gastroenterology

## 2020-05-07 ENCOUNTER — Ambulatory Visit (INDEPENDENT_AMBULATORY_CARE_PROVIDER_SITE_OTHER): Payer: Medicare PPO | Admitting: Gastroenterology

## 2020-05-07 VITALS — BP 140/90 | HR 96 | Ht 68.0 in | Wt 112.5 lb

## 2020-05-07 DIAGNOSIS — R636 Underweight: Secondary | ICD-10-CM | POA: Diagnosis not present

## 2020-05-07 DIAGNOSIS — Z86010 Personal history of colonic polyps: Secondary | ICD-10-CM | POA: Diagnosis not present

## 2020-05-07 MED ORDER — PLENVU 140 G PO SOLR
ORAL | 0 refills | Status: DC
Start: 2020-05-07 — End: 2021-07-02

## 2020-05-07 NOTE — Patient Instructions (Signed)
If you are age 77 or older, your body mass index should be between 23-30. Your Body mass index is 17.11 kg/m. If this is out of the aforementioned range listed, please consider follow up with your Primary Care Provider.  If you are age 47 or younger, your body mass index should be between 19-25. Your Body mass index is 17.11 kg/m. If this is out of the aformentioned range listed, please consider follow up with your Primary Care Provider.   You have been scheduled for a colonoscopy. Please follow written instructions given to you at your visit today.  Please pick up your prep supplies at the pharmacy within the next 1-3 days. If you use inhalers (even only as needed), please bring them with you on the day of your procedure.  It was a pleasure to see you today!  Dr. Myrtie Neither

## 2020-05-07 NOTE — Progress Notes (Signed)
[facility] Consult Note:  History: Roberto BAMBER [name] 05/07/2020  Referring provider: Corwin Levins, MD  Reason for consult/chief complaint: hx of colon polyps and Inguinal Hernia (right side, pt states when he lift pot with water it comes out and bothers him)   Subjective  HPI:  This is a very pleasant 79 year old man referred by primary care for history of colon polyps (see below). He has limited health literacy and difficulty remembering events and dates.  He recalls having a colonoscopy "many years ago" at the [place].  He also believes he had a right-sided inguinal hernia repair decades ago.  Sometimes he has some right groin pain if he lifts something heavy.  Courtney has not noticed blood in his stool, and says his bowel habits are regular.  He denies dysphagia, odynophagia, heartburn, nausea, vomiting.    ROS:  Review of Systems  Constitutional: Negative for appetite change and unexpected weight change.  HENT: Negative for mouth sores and voice change.   Eyes: Negative for pain and redness.  Respiratory: Negative for cough and shortness of breath.   Cardiovascular: Negative for chest pain and palpitations.  Genitourinary: Negative for dysuria and hematuria.  Musculoskeletal: Positive for arthralgias and back pain. Negative for myalgias.  Skin: Negative for pallor and rash.  Neurological: Negative for weakness and headaches.  Hematological: Negative for adenopathy.     Past Medical History: Past Medical History:  Diagnosis Date  . Aneurysm (HCC)    per pt,in head  . Arthritis   . Chicken pox   . Depression   . Hypercholesteremia   . Hypertension   . Migraine   . PTSD (post-traumatic stress disorder) 06/24/2017  . Stroke (HCC)    No residual effects  . Syphilis   . Tuberculosis   . Vitamin D deficiency    02/01/2020 primary care note from routine visit was reviewed.  Past Surgical History: Past Surgical History:  Procedure Laterality  Date  . COLONOSCOPY    . HERNIA REPAIR Right    x 2     Family History: Family History  Problem Relation Age of Onset  . Dementia Mother   . Healthy Father   . Prostate cancer Brother     Social History: Social History   Socioeconomic History  . Marital status: Widowed    Spouse name: Not on file  . Number of children: 2  . Years of education: 49  . Highest education level: Not on file  Occupational History  . Occupation: Retired  Tobacco Use  . Smoking status: Current Every Day Smoker    Packs/day: 0.50    Years: 50.00    Pack years: 25.00    Types: Cigarettes  . Smokeless tobacco: Never Used  Vaping Use  . Vaping Use: Never used  Substance and Sexual Activity  . Alcohol use: Yes    Alcohol/week: 2.0 - 3.0 standard drinks    Types: 2 - 3 Shots of liquor per week    Comment: social, 3 per day  . Drug use: No  . Sexual activity: Never  Other Topics Concern  . Not on file  Social History Narrative   Fun: Sit on the back porch and relax.   Denies religious beliefs effecting health care.    Lives alone in a one story home.  Has 2 biological children and 3 adopted children.   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   Social Determinants of Health   Financial Resource Strain:   .  Difficulty of Paying Living Expenses: Not on file  Food Insecurity:   . Worried About Programme researcher, broadcasting/film/video in the Last Year: Not on file  . Ran Out of Food in the Last Year: Not on file  Transportation Needs:   . Lack of Transportation (Medical): Not on file  . Lack of Transportation (Non-Medical): Not on file  Physical Activity:   . Days of Exercise per Week: Not on file  . Minutes of Exercise per Session: Not on file  Stress:   . Feeling of Stress : Not on file  Social Connections:   . Frequency of Communication with Friends and Family: Not on file  . Frequency of Social Gatherings with Friends and Family: Not on file  . Attends Religious Services: Not on file  .  Active Member of Clubs or Organizations: Not on file  . Attends Banker Meetings: Not on file  . Marital Status: Not on file    Allergies: No Known Allergies  Outpatient Meds: Current Outpatient Medications  Medication Sig Dispense Refill  . aspirin 325 MG tablet Take 1 tablet (325 mg total) by mouth daily. 30 tablet 0  . atorvastatin (LIPITOR) 20 MG tablet Take 1 tablet (20 mg total) by mouth daily at 6 PM. 90 tablet 3  . buPROPion (ZYBAN) 150 MG 12 hr tablet Take 1 tablet by mouth daily for 3 days then 1 tablet by mouth twice daily. 180 tablet 3  . donepezil (ARICEPT) 10 MG tablet Take 1 tablet (10 mg total) by mouth at bedtime. 90 tablet 3  . fexofenadine (ALLEGRA) 180 MG tablet Take 1 tablet (180 mg total) by mouth daily. 90 tablet 3  . gabapentin (NEURONTIN) 300 MG capsule Take 1 capsule (300 mg total) by mouth 3 (three) times daily. 270 capsule 3  . hydrochlorothiazide (HYDRODIURIL) 25 MG tablet Take 1 tablet (25 mg total) by mouth daily. 90 tablet 3  . meloxicam (MOBIC) 15 MG tablet TAKE ONE TABLET BY MOUTH  DAILY 90 tablet 3  . memantine (NAMENDA) 5 MG tablet Take 1 tablet (5 mg total) by mouth 2 (two) times daily. 180 tablet 3  . sertraline (ZOLOFT) 25 MG tablet Take 1 tablet (25 mg total) by mouth daily. Must keep appt w/new provider for future refills 90 tablet 3  . vitamin B-12 (CYANOCOBALAMIN) 100 MCG tablet Take 1 tablet (100 mcg total) by mouth daily. 90 tablet 3  . Vitamin D, Ergocalciferol, (DRISDOL) 1.25 MG (50000 UNIT) CAPS capsule Take 1 capsule (50,000 Units total) by mouth every 7 (seven) days. 12 capsule 0   No current facility-administered medications for this visit.      ___________________________________________________________________ Objective   Exam:  BP 140/90 (BP Location: Left Arm, Patient Position: Sitting, Cuff Size: Normal)   Pulse 96   Ht 5\' 8"  (1.727 m) Comment: height measured without shoes  Wt 112 lb 8 oz (51 kg)   BMI 17.11  kg/m    General: Thin elderly man.  In motorized wheelchair, but able to transfer to bed and back independently.  He says he can walk about 10 feet but needs to hold onto the wall because he sometimes feels unsteady.  Smells strongly of cigarettes  Eyes: sclera anicteric, no redness  ENT: oral mucosa moist without lesions, no cervical or supraclavicular lymphadenopathy  CV: RRR without murmur, S1/S2, no JVD, no peripheral edema  Resp: clear to auscultation bilaterally, normal RR and effort noted  GI: soft, no tenderness, with active bowel sounds.  No guarding or palpable organomegaly noted.  No groin hernia noted supine  Skin; warm and dry, no rash or jaundice noted  Neuro: awake, alert and oriented x 3. Normal gross motor function and fluent speech  Labs:  CBC Latest Ref Rng & Units 02/01/2020 03/10/2019 10/16/2017  WBC 4.0 - 10.5 K/uL 5.6 5.0 -  Hemoglobin 13.0 - 17.0 g/dL 07.8 67.5 44.9  Hematocrit 39 - 52 % 44.4 45.8 47.0  Platelets 150 - 400 K/uL 176.0 146.0(L) -   Negative FOBT July 2017 (patient had paper copy of result ordered by his primary care provider at the time, Marcos Eke, NP  Summary of colonoscopy report at [facility] March 2011 reports normal ileum, single pedunculated polyp in sigmoid colon (no indication of size), removed with snare cautery, internal hemorrhoids.  Recommendation was given for repeat colonoscopy in 3 years.  No pathology report or full colonoscopy report available.  Assessment: Encounter Diagnoses  Name Primary?  . Personal history of colonic polyps Yes  . Underweight     He is 10 years out from removal of a colon polyp unknown size of pathology.  Assuming it was adenomatous, he is well overdue for surveillance colonoscopy.  Given his memory difficulty and the fact that he lives alone, I have some concerns about how well he will be able to manage bowel preparation and the logistics of transportation.  He says his 14 year old sister lives  in the area and he is confident she would be agreeable to bringing him.  Our staff will be in contact with him closer to the procedure date to help remind him and navigate these logistics as best we can.  He was agreeable after thorough discussion of procedure and risks.  The benefits and risks of the planned procedure were described in detail with the patient or (when appropriate) their health care proxy.  Risks were outlined as including, but not limited to, bleeding, infection, perforation, adverse medication reaction leading to cardiac or pulmonary decompensation, pancreatitis (if ERCP).  The limitation of incomplete mucosal visualization was also discussed.  No guarantees or warranties were given.   Thank you for the courtesy of this consult.  Please call me with any questions or concerns.  Roberto Wallace  CC: Referring provider noted above

## 2020-05-24 ENCOUNTER — Encounter: Payer: Self-pay | Admitting: Internal Medicine

## 2020-07-02 ENCOUNTER — Encounter: Payer: Medicare PPO | Admitting: Gastroenterology

## 2020-07-02 ENCOUNTER — Telehealth: Payer: Self-pay | Admitting: Gastroenterology

## 2020-07-02 NOTE — Telephone Encounter (Signed)
Dr. Jonny Ruiz,  fyi   This patient did not show for his 8 AM colonoscopy today, and could not be reached by staff.  He was seen in clinic mid September for consideration of a colonoscopy due to history of colon polyps. I had some concerns about his difficulty managing the logistics of that, and with today's no-show it would seem that his memory difficulty and lack of social support preclude him having a routine colonoscopy.   Amada Jupiter, MD    [facility]

## 2020-08-01 ENCOUNTER — Ambulatory Visit: Payer: Medicare PPO | Admitting: Internal Medicine

## 2020-09-14 ENCOUNTER — Emergency Department (HOSPITAL_COMMUNITY): Payer: Medicare PPO

## 2020-09-14 ENCOUNTER — Emergency Department (HOSPITAL_COMMUNITY)
Admission: EM | Admit: 2020-09-14 | Discharge: 2020-09-14 | Disposition: A | Discharge status: Home / Self Care | Payer: Medicare PPO | Attending: Emergency Medicine | Admitting: Emergency Medicine

## 2020-09-14 ENCOUNTER — Encounter (HOSPITAL_COMMUNITY): Payer: Self-pay

## 2020-09-14 ENCOUNTER — Other Ambulatory Visit: Payer: Self-pay

## 2020-09-14 DIAGNOSIS — F0390 Unspecified dementia without behavioral disturbance: Secondary | ICD-10-CM | POA: Diagnosis not present

## 2020-09-14 DIAGNOSIS — R42 Dizziness and giddiness: Secondary | ICD-10-CM | POA: Diagnosis not present

## 2020-09-14 DIAGNOSIS — I10 Essential (primary) hypertension: Secondary | ICD-10-CM | POA: Diagnosis not present

## 2020-09-14 DIAGNOSIS — R059 Cough, unspecified: Secondary | ICD-10-CM | POA: Diagnosis not present

## 2020-09-14 DIAGNOSIS — J439 Emphysema, unspecified: Secondary | ICD-10-CM | POA: Diagnosis not present

## 2020-09-14 DIAGNOSIS — Z8673 Personal history of transient ischemic attack (TIA), and cerebral infarction without residual deficits: Secondary | ICD-10-CM | POA: Insufficient documentation

## 2020-09-14 DIAGNOSIS — F17210 Nicotine dependence, cigarettes, uncomplicated: Secondary | ICD-10-CM | POA: Insufficient documentation

## 2020-09-14 DIAGNOSIS — Z79899 Other long term (current) drug therapy: Secondary | ICD-10-CM | POA: Insufficient documentation

## 2020-09-14 DIAGNOSIS — Z20822 Contact with and (suspected) exposure to covid-19: Secondary | ICD-10-CM | POA: Diagnosis not present

## 2020-09-14 DIAGNOSIS — Z7982 Long term (current) use of aspirin: Secondary | ICD-10-CM | POA: Diagnosis not present

## 2020-09-14 DIAGNOSIS — R519 Headache, unspecified: Secondary | ICD-10-CM | POA: Diagnosis not present

## 2020-09-14 LAB — CBC WITH DIFFERENTIAL/PLATELET
Abs Immature Granulocytes: 0.02 10*3/uL (ref 0.00–0.07)
Basophils Absolute: 0 10*3/uL (ref 0.0–0.1)
Basophils Relative: 0 %
Eosinophils Absolute: 0.3 10*3/uL (ref 0.0–0.5)
Eosinophils Relative: 4 %
HCT: 42.9 % (ref 39.0–52.0)
Hemoglobin: 13.8 g/dL (ref 13.0–17.0)
Immature Granulocytes: 0 %
Lymphocytes Relative: 19 %
Lymphs Abs: 1.3 10*3/uL (ref 0.7–4.0)
MCH: 29.6 pg (ref 26.0–34.0)
MCHC: 32.2 g/dL (ref 30.0–36.0)
MCV: 91.9 fL (ref 80.0–100.0)
Monocytes Absolute: 0.6 10*3/uL (ref 0.1–1.0)
Monocytes Relative: 9 %
Neutro Abs: 4.6 10*3/uL (ref 1.7–7.7)
Neutrophils Relative %: 68 %
Platelets: 185 10*3/uL (ref 150–400)
RBC: 4.67 MIL/uL (ref 4.22–5.81)
RDW: 13.2 % (ref 11.5–15.5)
WBC: 6.8 10*3/uL (ref 4.0–10.5)
nRBC: 0 % (ref 0.0–0.2)

## 2020-09-14 LAB — COMPREHENSIVE METABOLIC PANEL
ALT: 14 U/L (ref 0–44)
AST: 17 U/L (ref 15–41)
Albumin: 3.9 g/dL (ref 3.5–5.0)
Alkaline Phosphatase: 57 U/L (ref 38–126)
Anion gap: 9 (ref 5–15)
BUN: 13 mg/dL (ref 8–23)
CO2: 23 mmol/L (ref 22–32)
Calcium: 8.5 mg/dL — ABNORMAL LOW (ref 8.9–10.3)
Chloride: 107 mmol/L (ref 98–111)
Creatinine, Ser: 0.83 mg/dL (ref 0.61–1.24)
GFR, Estimated: >60 mL/min (ref >60)
Glucose, Bld: 120 mg/dL — ABNORMAL HIGH (ref 70–99)
Potassium: 3.3 mmol/L — ABNORMAL LOW (ref 3.5–5.1)
Sodium: 139 mmol/L (ref 135–145)
Total Bilirubin: 0.5 mg/dL (ref 0.3–1.2)
Total Protein: 7.3 g/dL (ref 6.5–8.1)

## 2020-09-14 LAB — URINALYSIS, ROUTINE W REFLEX MICROSCOPIC
Bacteria, UA: NONE SEEN
Bilirubin Urine: NEGATIVE
Glucose, UA: NEGATIVE mg/dL
Ketones, ur: NEGATIVE mg/dL
Leukocytes,Ua: NEGATIVE
Nitrite: NEGATIVE
Protein, ur: NEGATIVE mg/dL
Specific Gravity, Urine: 1.013 (ref 1.005–1.030)
pH: 5 (ref 5.0–8.0)

## 2020-09-14 LAB — SARS CORONAVIRUS 2 BY RT PCR (HOSPITAL ORDER, PERFORMED IN ~~LOC~~ HOSPITAL LAB): SARS Coronavirus 2: NEGATIVE

## 2020-09-14 LAB — TROPONIN I (HIGH SENSITIVITY): Troponin I (High Sensitivity): 29 ng/L — ABNORMAL HIGH (ref <18)

## 2020-09-14 MED ORDER — MECLIZINE HCL 12.5 MG PO TABS: 12.5000 mg | ORAL_TABLET | Freq: Three times a day (TID) | ORAL | 0 refills | Status: DC | PRN

## 2020-09-14 MED ORDER — SODIUM CHLORIDE 0.9 % IV BOLUS
500.0000 mL | Freq: Once | INTRAVENOUS | Status: AC
  Administered 2020-09-14: 500 mL via INTRAVENOUS

## 2020-09-14 MED ORDER — METOCLOPRAMIDE HCL 5 MG/ML IJ SOLN
10.0000 mg | Freq: Once | INTRAMUSCULAR | Status: AC
  Administered 2020-09-14: 10 mg via INTRAVENOUS
  Filled 2020-09-14: qty 2

## 2020-09-14 MED ORDER — MECLIZINE HCL 25 MG PO TABS
25.0000 mg | ORAL_TABLET | Freq: Once | ORAL | Status: AC
  Administered 2020-09-14: 25 mg via ORAL
  Filled 2020-09-14: qty 1

## 2020-09-14 MED ORDER — DIPHENHYDRAMINE HCL 50 MG/ML IJ SOLN
12.5000 mg | Freq: Once | INTRAMUSCULAR | Status: AC
  Administered 2020-09-14: 12.5 mg via INTRAVENOUS
  Filled 2020-09-14: qty 1

## 2020-09-14 NOTE — ED Provider Notes (Addendum)
[facility]-EMERGENCY DEPT Provider Note   CSN: [id number] Arrival date & time: 09/14/20  1725     History Chief Complaint  Patient presents with  . Hypertension  . Headache    Roberto RosCharles O Deshazo [name] is a 80 y.o. male history of hypertension, PTSD here presenting with hypertension and dizziness.  Patient states that he has been homeless for the last several months.  He states that he has been having some nonproductive cough and some headaches for the last several days.  He states that he has been hanging out in [place] and he had worsening headache and dizziness and had trouble walking so called EMS.  He was noted to be hypertensive initially but he has not been taking his blood pressure medicines for the last several months.  Patient apparently reported cerebral aneurysm previously but had MRI and CTA in 2019 that showed no aneurysms.  He was in fact admitted for very similar symptoms and had TIA work-up and did not show any stroke on MRI.   The history is provided by the patient.       Past Medical History:  Diagnosis Date  . Aneurysm (HCC)    per pt,in head  . Arthritis   . Chicken pox   . Depression   . Hypercholesteremia   . Hypertension   . Migraine   . PTSD (post-traumatic stress disorder) 06/24/2017  . Stroke (HCC)    No residual effects  . Syphilis   . Tuberculosis   . Vitamin D deficiency     Patient Active Problem List   Diagnosis Date Noted  . Microhematuria 02/01/2020  . Allergic rhinitis 02/01/2020  . History of colonic polyps 02/01/2020  . Vitamin D deficiency 03/12/2019  . B12 deficiency 03/12/2019  . Hyperglycemia 03/10/2019  . Cough 03/10/2019  . History of exposure to asbestos 03/10/2019  . Hyperlipidemia 10/16/2017  . Depression 10/16/2017  . Stroke-like symptoms 10/16/2017  . Mild dementia (HCC) 10/16/2017  . Lateral epicondylitis, right elbow 06/26/2017  . PTSD (post-traumatic stress disorder) 06/24/2017  .  Right wrist pain 06/09/2016  . Osteoarthritis 02/05/2016  . Tuberculosis 02/05/2016  . Loss of weight 10/31/2015  . 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 Neuropathy 10/03/2015  . Intermittent memory loss 10/03/2015  . Decreased mobility 10/03/2015  . Encounter for well adult exam with abnormal findings 04/16/2015  . Medicare annual wellness visit, subsequent 04/16/2015  . TOBACCO USER 06/14/2010  . Anxiety and depression 11/19/2009  . HYPERTENSION, BENIGN ESSENTIAL 11/19/2009  . Other specified cardiac dysrhythmias(427.89) 11/19/2009  . GEN OSTEOARTHROSIS INVOLVING MULTIPLE SITES 11/19/2009  . History of cardiovascular disorder 11/19/2009    Past Surgical History:  Procedure Laterality Date  . COLONOSCOPY    . HERNIA REPAIR Right    x 2       Family History  Problem Relation Age of Onset  . Dementia Mother   . Healthy Father   . Prostate cancer Brother     Social History   Tobacco Use  . Smoking status: Current Every Day Smoker    Packs/day: 0.50    Years: 50.00    Pack years: 25.00    Types: Cigarettes  . Smokeless tobacco: Never Used  Vaping Use  . Vaping Use: Never used  Substance Use Topics  . Alcohol use: Yes    Alcohol/week: 2.0 - 3.0 standard drinks    Types: 2 - 3 Shots of liquor per week    Comment:  social, 3 per day  . Drug use: No    Home Medications Prior to Admission medications   Medication Sig Start Date End Date Taking? Authorizing Provider  aspirin 325 MG tablet Take 1 tablet (325 mg total) by mouth daily. 10/18/17   Darlin Drop, DO  atorvastatin (LIPITOR) 20 MG tablet Take 1 tablet (20 mg total) by mouth daily at 6 PM. 02/01/20   Corwin Levins, MD  buPROPion (ZYBAN) 150 MG 12 hr tablet Take 1 tablet by mouth daily for 3 days then 1 tablet by mouth twice daily. 02/01/20   Corwin Levins, MD  donepezil (ARICEPT) 10 MG tablet Take 1 tablet (10 mg total) by mouth at bedtime. 02/01/20   Corwin Levins, MD   fexofenadine (ALLEGRA) 180 MG tablet Take 1 tablet (180 mg total) by mouth daily. 02/01/20 01/31/21  Corwin Levins, MD  gabapentin (NEURONTIN) 300 MG capsule Take 1 capsule (300 mg total) by mouth 3 (three) times daily. 02/01/20   Corwin Levins, MD  hydrochlorothiazide (HYDRODIURIL) 25 MG tablet Take 1 tablet (25 mg total) by mouth daily. 02/01/20   Corwin Levins, MD  meloxicam (MOBIC) 15 MG tablet TAKE ONE TABLET BY MOUTH  DAILY 03/10/19   Corwin Levins, MD  memantine (NAMENDA) 5 MG tablet Take 1 tablet (5 mg total) by mouth 2 (two) times daily. 02/01/20   Corwin Levins, MD  PEG-KCl-NaCl-NaSulf-Na Asc-C (PLENVU) 140 g SOLR Use as directed 05/07/20   Sherrilyn Rist, MD  sertraline (ZOLOFT) 25 MG tablet Take 1 tablet (25 mg total) by mouth daily. Must keep appt w/new provider for future refills 02/01/20   Corwin Levins, MD  vitamin B-12 (CYANOCOBALAMIN) 100 MCG tablet Take 1 tablet (100 mcg total) by mouth daily. 02/04/20   Corwin Levins, MD  Vitamin D, Ergocalciferol, (DRISDOL) 1.25 MG (50000 UNIT) CAPS capsule Take 1 capsule (50,000 Units total) by mouth every 7 (seven) days. 02/04/20   Corwin Levins, MD    Allergies    Patient has no known allergies.  Review of Systems   Review of Systems  Neurological: Positive for headaches.  All other systems reviewed and are negative.   Physical Exam Updated Vital Signs BP 122/89   Pulse 76   Temp 98.7 F (37.1 C) (Oral)   Resp 18   Ht 5\' 8"  (1.727 m)   Wt 51 kg   SpO2 100%   BMI 17.10 kg/m   Physical Exam Vitals and nursing note reviewed.  Constitutional:      Comments: Disheveled unkempt  HENT:     Head: Normocephalic.     Mouth/Throat:     Pharynx: Oropharynx is clear.  Eyes:     Extraocular Movements: Extraocular movements intact.     Comments: No obvious nystagmus   Cardiovascular:     Rate and Rhythm: Normal rate and regular rhythm.     Heart sounds: Normal heart sounds.  Pulmonary:     Effort: Pulmonary effort is normal.     Breath  sounds: Normal breath sounds.  Abdominal:     General: Bowel sounds are normal.     Palpations: Abdomen is soft.  Musculoskeletal:        General: Normal range of motion.     Cervical back: Normal range of motion and neck supple.  Skin:    General: Skin is warm.  Neurological:     Mental Status: He is alert and oriented to person, place,  and time.     Cranial Nerves: No cranial nerve deficit or dysarthria.     Comments: Cranial nerves II to XII is intact, normal strength bilateral arms and legs.  Psychiatric:        Mood and Affect: Mood normal.        Behavior: Behavior normal.     ED Results / Procedures / Treatments   Labs (all labs ordered are listed, but only abnormal results are displayed) Labs Reviewed  COMPREHENSIVE METABOLIC PANEL - Abnormal; Notable for the following components:      Result Value   Potassium 3.3 (*)    Glucose, Bld 120 (*)    Calcium 8.5 (*)    All other components within normal limits  SARS CORONAVIRUS 2 BY RT PCR (HOSPITAL ORDER, PERFORMED IN [facility])  CBC WITH DIFFERENTIAL/PLATELET  URINALYSIS, ROUTINE W REFLEX MICROSCOPIC  TROPONIN I (HIGH SENSITIVITY)    EKG EKG Interpretation  Date/Time:  Friday September 14 2020 17:49:00 EST Ventricular Rate:  85 PR Interval:    QRS Duration: 88 QT Interval:  369 QTC Calculation: 439 R Axis:   84 Text Interpretation: Sinus rhythm Atrial premature complex Borderline right axis deviation Minimal ST elevation, anterolateral leads No significant change since last tracing Confirmed by Richardean Canal [phone]) on 09/14/2020 5:55:38 PM   Radiology DG Chest Port 1 View  Result Date: 09/14/2020 CLINICAL DATA:  Headache, lightheadedness, hypertension, cough, tobacco abuse EXAM: PORTABLE CHEST 1 VIEW COMPARISON:  03/10/2019 FINDINGS: Single frontal view of the chest excludes the costophrenic angles by collimation. Cardiac silhouette is stable. Continued background scarring and emphysema unchanged. No  acute airspace disease, effusion, or pneumothorax. Prior healed left rib fractures unchanged. IMPRESSION: 1. Stable emphysema.  No acute process. Electronically Signed   By: Sharlet Salina M.D.   On: 09/14/2020 18:20    Procedures Procedures (including critical care time)  Medications Ordered in ED Medications  meclizine (ANTIVERT) tablet 25 mg (25 mg Oral Given 09/14/20 1808)  sodium chloride 0.9 % bolus 500 mL (500 mLs Intravenous New Bag/Given 09/14/20 1808)  metoCLOPramide (REGLAN) injection 10 mg (10 mg Intravenous Given 09/14/20 1809)  diphenhydrAMINE (BENADRYL) injection 12.5 mg (12.5 mg Intravenous Given 09/14/20 1809)    ED Course  I have reviewed the triage vital signs and the nursing notes.  Pertinent labs & imaging results that were available during my care of the patient were reviewed by me and considered in my medical decision making (see chart for details).    MDM Rules/Calculators/A&P                         Roberto RAYL [name] is a 80 y.o. male who presenting with dizziness.  Patient had similar symptoms previously and had negative work-up.  Patient is homeless right now and has nowhere to stay.  He has no focal neurodeficit.  I have low suspicion for posterior circulation stroke.  I think likely peripheral vertigo with some dehydration.  Plan to get CT head and if negative will hold off on MRI.  We will also get CBC and CMP and chest x-ray and COVID test.  Will consult social work  8:11 PM Labs unremarkable.  COVID test is negative.  CT head is normal. Patient felt better after meclizine.  I talked to social work and patient is able to get a shelter for the night.  He left his car at [employer] and he can pick up the  car later.  His blood pressure came down to 120s with migraine cocktail and meclizine alone.  Will dc home with meclizine, neuro follow up   Final Clinical Impression(s) / ED Diagnoses Final diagnoses:  None    Rx / DC Orders ED Discharge Orders     None       Charlynne Pander, MD 09/14/20 2012    Charlynne Pander, MD 09/14/20 2012

## 2020-09-14 NOTE — ED Triage Notes (Signed)
EMS called to [facility] for c/o headache, lightheadedness and hypertension.  Pt admitted to anxiety, PTSD. No BP meds since Sept.

## 2020-09-14 NOTE — Clinical Social Work Note (Signed)
TOC consulted due to pt being homeless. CSW spoke with pt over the phone who states he has been staying on someones couch but found out they have bugs in the home. Pt states he is interested in homeless shelter. CSW confirmed pts COVID test was negative. CSW spoke with ED secretary who will have taxi voucher for pt. Pt will [facility] [address]. TOC signing off.

## 2020-09-14 NOTE — Discharge Instructions (Signed)
Please stay hydrated.  Take meclizine as needed for dizziness.  Please follow-up with your primary care doctor and neurologist.  Your blood pressure came down by itself but you need to see your primary care doctor.  Return to ER if you have worse dizziness, headaches, chest pain, trouble breathing

## 2020-12-12 ENCOUNTER — Other Ambulatory Visit: Payer: Self-pay

## 2020-12-12 ENCOUNTER — Ambulatory Visit (INDEPENDENT_AMBULATORY_CARE_PROVIDER_SITE_OTHER): Payer: Medicare PPO | Admitting: Internal Medicine

## 2020-12-12 VITALS — BP 180/110 | HR 92 | Temp 98.5°F | Ht 68.0 in | Wt 114.0 lb

## 2020-12-12 DIAGNOSIS — F0390 Unspecified dementia without behavioral disturbance: Secondary | ICD-10-CM | POA: Diagnosis not present

## 2020-12-12 DIAGNOSIS — E785 Hyperlipidemia, unspecified: Secondary | ICD-10-CM

## 2020-12-12 DIAGNOSIS — Z0001 Encounter for general adult medical examination with abnormal findings: Secondary | ICD-10-CM

## 2020-12-12 DIAGNOSIS — Z5900 Homelessness unspecified: Secondary | ICD-10-CM | POA: Diagnosis not present

## 2020-12-12 DIAGNOSIS — Z125 Encounter for screening for malignant neoplasm of prostate: Secondary | ICD-10-CM

## 2020-12-12 DIAGNOSIS — F03A0 Unspecified dementia, mild, without behavioral disturbance, psychotic disturbance, mood disturbance, and anxiety: Secondary | ICD-10-CM

## 2020-12-12 DIAGNOSIS — R739 Hyperglycemia, unspecified: Secondary | ICD-10-CM | POA: Diagnosis not present

## 2020-12-12 DIAGNOSIS — E559 Vitamin D deficiency, unspecified: Secondary | ICD-10-CM

## 2020-12-12 DIAGNOSIS — E538 Deficiency of other specified B group vitamins: Secondary | ICD-10-CM | POA: Diagnosis not present

## 2020-12-12 DIAGNOSIS — H9193 Unspecified hearing loss, bilateral: Secondary | ICD-10-CM

## 2020-12-12 DIAGNOSIS — Z0100 Encounter for examination of eyes and vision without abnormal findings: Secondary | ICD-10-CM

## 2020-12-12 DIAGNOSIS — F17200 Nicotine dependence, unspecified, uncomplicated: Secondary | ICD-10-CM

## 2020-12-12 DIAGNOSIS — I10 Essential (primary) hypertension: Secondary | ICD-10-CM | POA: Diagnosis not present

## 2020-12-12 LAB — BASIC METABOLIC PANEL
BUN: 12 mg/dL (ref 6–23)
CO2: 29 mEq/L (ref 19–32)
Calcium: 9.7 mg/dL (ref 8.4–10.5)
Chloride: 103 mEq/L (ref 96–112)
Creatinine, Ser: 0.98 mg/dL (ref 0.40–1.50)
GFR: 74.47 mL/min (ref >60.00)
Glucose, Bld: 108 mg/dL — ABNORMAL HIGH (ref 70–99)
Potassium: 4.7 mEq/L (ref 3.5–5.1)
Sodium: 140 mEq/L (ref 135–145)

## 2020-12-12 LAB — HEPATIC FUNCTION PANEL
ALT: 14 U/L (ref 0–53)
AST: 18 U/L (ref 0–37)
Albumin: 4.2 g/dL (ref 3.5–5.2)
Alkaline Phosphatase: 94 U/L (ref 39–117)
Bilirubin, Direct: 0.1 mg/dL (ref 0.0–0.3)
Total Bilirubin: 0.7 mg/dL (ref 0.2–1.2)
Total Protein: 7.5 g/dL (ref 6.0–8.3)

## 2020-12-12 LAB — URINALYSIS, ROUTINE W REFLEX MICROSCOPIC
Bilirubin Urine: NEGATIVE
Ketones, ur: NEGATIVE
Leukocytes,Ua: NEGATIVE
Nitrite: NEGATIVE
Specific Gravity, Urine: 1.02 (ref 1.000–1.030)
Total Protein, Urine: 30 — AB
Urine Glucose: NEGATIVE
Urobilinogen, UA: 0.2 (ref 0.0–1.0)
pH: 8 (ref 5.0–8.0)

## 2020-12-12 LAB — CBC WITH DIFFERENTIAL/PLATELET
Basophils Absolute: 0 10*3/uL (ref 0.0–0.1)
Basophils Relative: 0.3 % (ref 0.0–3.0)
Eosinophils Absolute: 0.2 10*3/uL (ref 0.0–0.7)
Eosinophils Relative: 2.8 % (ref 0.0–5.0)
HCT: 45.9 % (ref 39.0–52.0)
Hemoglobin: 15.1 g/dL (ref 13.0–17.0)
Lymphocytes Relative: 14.1 % (ref 12.0–46.0)
Lymphs Abs: 1.1 10*3/uL (ref 0.7–4.0)
MCHC: 32.9 g/dL (ref 30.0–36.0)
MCV: 91.5 fl (ref 78.0–100.0)
Monocytes Absolute: 0.8 10*3/uL (ref 0.1–1.0)
Monocytes Relative: 10.6 % (ref 3.0–12.0)
Neutro Abs: 5.6 10*3/uL (ref 1.4–7.7)
Neutrophils Relative %: 72.2 % (ref 43.0–77.0)
Platelets: 218 10*3/uL (ref 150.0–400.0)
RBC: 5.01 Mil/uL (ref 4.22–5.81)
RDW: 14.2 % (ref 11.5–15.5)
WBC: 7.8 10*3/uL (ref 4.0–10.5)

## 2020-12-12 LAB — LIPID PANEL
Cholesterol: 184 mg/dL (ref 0–200)
HDL: 75.1 mg/dL (ref >39.00)
LDL Cholesterol: 97 mg/dL (ref 0–99)
NonHDL: 108.89
Total CHOL/HDL Ratio: 2
Triglycerides: 58 mg/dL (ref 0.0–149.0)
VLDL: 11.6 mg/dL (ref 0.0–40.0)

## 2020-12-12 LAB — TSH: TSH: 1.26 u[IU]/mL (ref 0.35–4.50)

## 2020-12-12 LAB — VITAMIN D 25 HYDROXY (VIT D DEFICIENCY, FRACTURES): VITD: 21.47 ng/mL — ABNORMAL LOW (ref 30.00–100.00)

## 2020-12-12 LAB — HEMOGLOBIN A1C: Hgb A1c MFr Bld: 5.1 % (ref 4.6–6.5)

## 2020-12-12 LAB — VITAMIN B12: Vitamin B-12: 152 pg/mL — ABNORMAL LOW (ref 211–911)

## 2020-12-12 LAB — PSA: PSA: 2.77 ng/mL (ref 0.10–4.00)

## 2020-12-12 MED ORDER — HYDROCHLOROTHIAZIDE 25 MG PO TABS: 25.0000 mg | ORAL_TABLET | Freq: Every day | ORAL | 3 refills | Status: DC

## 2020-12-12 MED ORDER — MEMANTINE HCL 5 MG PO TABS: 5.0000 mg | ORAL_TABLET | Freq: Two times a day (BID) | ORAL | 3 refills | Status: DC

## 2020-12-12 MED ORDER — DONEPEZIL HCL 10 MG PO TABS: 10.0000 mg | ORAL_TABLET | Freq: Every day | ORAL | 3 refills | Status: DC

## 2020-12-12 MED ORDER — LOSARTAN POTASSIUM 100 MG PO TABS: 100.0000 mg | ORAL_TABLET | Freq: Every day | ORAL | 3 refills | Status: DC

## 2020-12-12 NOTE — Progress Notes (Signed)
Patient ID: Roberto Ros., male   DOB: [birth year], 80 y.o.   MRN: [id number]         Chief Complaint:: wellness exam and htn, smoking       HPI:  Roberto Rayos. is a 80 y.o. male here for wellness exam; up to date with preventive referrals and immunizations except due for eye exam and has hearing loss - need audiology referral                        Also homeless currently, living in a car since oct 2021; gas expensive to heat the car, working with soc services on [facility] for assisted living placement; Pt denies chest pain, increased sob or doe, wheezing, orthopnea, PND, increased LE swelling, palpitations, dizziness or syncope.   Pt denies polydipsia, polyuria, Denies new focal neuro ss  Pt denies fever, wt loss, night sweats, loss of appetite, or other constitutional symptoms  No new complaints.  Ran out of BP meds long ago, willing to restart.  Still smoking, not ready to quit Wt Readings from Last 3 Encounters:  12/12/20 114 lb (51.7 kg)  09/14/20 112 lb 7 oz (51 kg)  05/07/20 112 lb 8 oz (51 kg)   BP Readings from Last 3 Encounters:  12/12/20 (!) 180/110  09/14/20 125/89  05/07/20 140/90   Immunization History  Administered Date(s) Administered  . Influenza, High Dose Seasonal PF 04/28/2017, 05/27/2018  . Influenza-Unspecified 08/06/2016, 05/27/2018  . Janssen (J&J) SARS-COV-2 Vaccination 12/02/2019  . Pneumococcal Conjugate-13 04/16/2015, 08/06/2016  . Pneumococcal Polysaccharide-23 04/28/2017  . Td 04/16/2015  . Zoster 05/19/2015  There are no preventive care reminders to display for this patient.    Past Medical History:  Diagnosis Date  . Aneurysm (HCC)    per pt,in head  . Arthritis   . Chicken pox   . Depression   . Hypercholesteremia   . Hypertension   . Migraine   . PTSD (post-traumatic stress disorder) 06/24/2017  . Stroke (HCC)    No residual effects  . Syphilis   . Tuberculosis   . Vitamin D deficiency    Past Surgical History:  Procedure  Laterality Date  . COLONOSCOPY    . HERNIA REPAIR Right    x 2    reports that he has been smoking cigarettes. He has a 25.00 pack-year smoking history. He has never used smokeless tobacco. He reports current alcohol use of about 2.0 - 3.0 standard drinks of alcohol per week. He reports that he does not use drugs. family history includes Dementia in his mother; Healthy in his father; Prostate cancer in his brother. No Known Allergies Current Outpatient Medications on File Prior to Visit  Medication Sig Dispense Refill  . aspirin 325 MG tablet Take 1 tablet (325 mg total) by mouth daily. (Patient not taking: Reported on 12/12/2020) 30 tablet 0  . atorvastatin (LIPITOR) 20 MG tablet Take 1 tablet (20 mg total) by mouth daily at 6 PM. (Patient not taking: Reported on 12/12/2020) 90 tablet 3  . fexofenadine (ALLEGRA) 180 MG tablet Take 1 tablet (180 mg total) by mouth daily. (Patient not taking: Reported on 12/12/2020) 90 tablet 3  . PEG-KCl-NaCl-NaSulf-Na Asc-C (PLENVU) 140 g SOLR Use as directed (Patient not taking: Reported on 12/12/2020) 1 each 0  . sertraline (ZOLOFT) 25 MG tablet Take 1 tablet (25 mg total) by mouth daily. Must keep appt w/new provider for future refills (Patient not taking: Reported on  12/12/2020) 90 tablet 3   No current facility-administered medications on file prior to visit.        ROS:  All others reviewed and negative.  Objective        PE:  BP (!) 180/110 (BP Location: Left Arm, Patient Position: Sitting, Cuff Size: Normal)   Pulse 92   Temp 98.5 F (36.9 C) (Oral)   Ht 5\' 8"  (1.727 m)   Wt 114 lb (51.7 kg)   SpO2 98%   BMI 17.33 kg/m                 Constitutional: Pt appears in NAD               HENT: Head: NCAT.                Right Ear: External ear normal.                 Left Ear: External ear normal.                Eyes: . Pupils are equal, round, and reactive to light. Conjunctivae and EOM are normal               Nose: without d/c or deformity                Neck: Neck supple. Gross normal ROM               Cardiovascular: Normal rate and regular rhythm.                 Pulmonary/Chest: Effort normal and breath sounds without rales or wheezing.                Abd:  Soft, NT, ND, + BS, no organomegaly               Neurological: Pt is alert. At baseline orientation, motor grossly intact               Skin: Skin is warm. No rashes, no other new lesions, LE edema - none              Psychiatric: Pt behavior is normal without agitation   Micro: none  Cardiac tracings I have personally interpreted today:  none  Pertinent Radiological findings (summarize): none   Lab Results  Component Value Date   WBC 7.8 12/12/2020   HGB 15.1 12/12/2020   HCT 45.9 12/12/2020   PLT 218.0 12/12/2020   GLUCOSE 108 (H) 12/12/2020   CHOL 184 12/12/2020   TRIG 58.0 12/12/2020   HDL 75.10 12/12/2020   LDLCALC 97 12/12/2020   ALT 14 12/12/2020   AST 18 12/12/2020   NA 140 12/12/2020   K 4.7 12/12/2020   CL 103 12/12/2020   CREATININE 0.98 12/12/2020   BUN 12 12/12/2020   CO2 29 12/12/2020   TSH 1.26 12/12/2020   PSA 2.77 12/12/2020   INR 0.99 10/16/2017   HGBA1C 5.1 12/12/2020   Assessment/Plan:  12/14/2020 [name] is a 80 y.o. Black or African American [2] male with  has a past medical history of Aneurysm (HCC), Arthritis, Chicken pox, Depression, Hypercholesteremia, Hypertension, Migraine, PTSD (post-traumatic stress disorder) (06/24/2017), Stroke (HCC), Syphilis, Tuberculosis, and Vitamin D deficiency.  Encounter for well adult exam with abnormal findings Age and sex appropriate education and counseling updated with regular exercise and diet Referrals for preventative services - for eye and audiology referrals Immunizations addressed - none needed Smoking counseling  -  none needed Evidence for depression or other mood disorder - none significant Most recent labs reviewed. I have personally reviewed and have noted: 1) the patient's  medical and social history 2) The patient's current medications and supplements 3) The patient's height, weight, and BMI have been recorded in the chart   Vitamin D deficiency Last vitamin D Lab Results  Component Value Date   VD25OH 21.47 (L) 12/12/2020   Low , to start oral replacement  Mild dementia (HCC) Stable, pt willing to restart med  HYPERTENSION, BENIGN ESSENTIAL Uncontrolled, to restart BP med, and f/u next visit  Hyperlipidemia Lab Results  Component Value Date   LDLCALC 97 12/12/2020   Stable, pt to continue current statin lipitor 20   Hyperglycemia Lab Results  Component Value Date   HGBA1C 5.1 12/12/2020   Stable, pt to continue current medical treatment  - diet   Homelessness For FL2 forms filled out and forward to soc services [facility] - fax [phone]  Also has son Roberto Wallace  [place]  Phone [phone]  B12 deficiency Lab Results  Component Value Date   VITAMINB12 152 (L) 12/12/2020   Low to start oral replacement - b12 1000 mcg qd  TOBACCO USER Counseled to quit, pt not ready  Followup: Return in about 4 weeks (around 01/09/2021).  [name], MD 12/16/2020 6:19 PM  [facility]

## 2020-12-12 NOTE — Patient Instructions (Addendum)
You will be contacted regarding the referral for: hearing and eye exams  Please continue all other medications as before - the blood pressure and memory medications  Please have the pharmacy call with any other refills you may need.  Please continue your efforts at being more active, low cholesterol diet, and weight control.  You are otherwise up to date with prevention measures today.  Please keep your appointments with your specialists as you may have planned  Your forms will be sent to the fax number you mentioned  Please go to the LAB at the blood drawing area for the tests to be done  You will be contacted by phone if any changes need to be made immediately.  Otherwise, you will receive a letter about your results with an explanation, but please check with MyChart first.  Please remember to sign up for MyChart if you have not done so, as this will be important to you in the future with finding out test results, communicating by private email, and scheduling acute appointments online when needed.  Please make an Appointment to return in 1 months, or sooner if needed

## 2020-12-13 ENCOUNTER — Other Ambulatory Visit: Payer: Self-pay | Admitting: Internal Medicine

## 2020-12-13 ENCOUNTER — Encounter: Payer: Self-pay | Admitting: Internal Medicine

## 2020-12-13 MED ORDER — THERA-D 2000 50 MCG (2000 UT) PO TABS: ORAL_TABLET | ORAL | 99 refills | Status: DC

## 2020-12-13 MED ORDER — VITAMIN B-12 100 MCG PO TABS: 100.0000 ug | ORAL_TABLET | Freq: Every day | ORAL | 3 refills | Status: DC

## 2020-12-16 ENCOUNTER — Encounter: Payer: Self-pay | Admitting: Internal Medicine

## 2020-12-16 NOTE — Assessment & Plan Note (Signed)
Uncontrolled, to restart BP med, and f/u next visit

## 2020-12-16 NOTE — Assessment & Plan Note (Signed)
Lab Results  Component Value Date   HGBA1C 5.1 12/12/2020   Stable, pt to continue current medical treatment  - diet

## 2020-12-16 NOTE — Assessment & Plan Note (Signed)
Counseled to quit, pt not ready 

## 2020-12-16 NOTE — Assessment & Plan Note (Signed)
Last vitamin D Lab Results  Component Value Date   VD25OH 21.47 (L) 12/12/2020   Low, to start oral replacement  

## 2020-12-16 NOTE — Assessment & Plan Note (Signed)
Lab Results  Component Value Date   LDLCALC 97 12/12/2020   Stable, pt to continue current statin lipitor 20

## 2020-12-16 NOTE — Assessment & Plan Note (Signed)
Age and sex appropriate education and counseling updated with regular exercise and diet Referrals for preventative services - for eye and audiology referrals Immunizations addressed - none needed Smoking counseling  - none needed Evidence for depression or other mood disorder - none significant Most recent labs reviewed. I have personally reviewed and have noted: 1) the patient's medical and social history 2) The patient's current medications and supplements 3) The patient's height, weight, and BMI have been recorded in the chart

## 2020-12-16 NOTE — Assessment & Plan Note (Signed)
Lab Results  Component Value Date   VITAMINB12 152 (L) 12/12/2020   Low to start oral replacement - b12 1000 mcg qd

## 2020-12-16 NOTE — Assessment & Plan Note (Addendum)
For FL2 forms filled out and forward to soc services Roberto Wallace [facility] - fax [phone]  Also has son Roberto Wallace  In [place]  Phone [phone]

## 2020-12-16 NOTE — Assessment & Plan Note (Signed)
Stable, pt willing to restart med

## 2020-12-20 ENCOUNTER — Telehealth: Payer: Self-pay | Admitting: Internal Medicine

## 2020-12-20 DIAGNOSIS — Z0279 Encounter for issue of other medical certificate: Secondary | ICD-10-CM

## 2020-12-20 NOTE — Telephone Encounter (Signed)
I received FL2 for patient. LOV: 12/12/20  Forms have been completed. Signed by provider. Faxed to Ermalene Postin @[phone] , copy sent to scan, &Charged for.

## 2020-12-24 ENCOUNTER — Encounter: Payer: Self-pay | Admitting: *Deleted

## 2020-12-24 ENCOUNTER — Telehealth: Payer: Self-pay | Admitting: *Deleted

## 2020-12-24 NOTE — Congregational Nurse Program (Signed)
Client came into office reporting his ankles are swollen and this is a new onset for him. Checked both ankles pitting edema 2+. Vitals 142/92 Pulse 85. CBG 95. Client reports he has a hx of a stroke, dementia and HTN. Client reports he stays in his car and uses an electric wheelchair. He is currently using a cane. 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 Contacted clients MD office [facility] Dr [name] and relayed clients symptoms and assessment. Client had an appt for late May and it was moved to May 3rd at 1:40. Offered to provide transportation and client refused saying he preferred his own transportation. Discussed S/S stroke, HTN and told client to call 911 if he experienced any symptoms. He acknowledges understanding. Lennyn Gange W RN CN [phone]

## 2020-12-24 NOTE — Telephone Encounter (Signed)
Appt scheduled with Dr [name] May 3rd at 1:40.

## 2020-12-25 ENCOUNTER — Ambulatory Visit (INDEPENDENT_AMBULATORY_CARE_PROVIDER_SITE_OTHER): Payer: Medicare PPO | Admitting: Internal Medicine

## 2020-12-25 ENCOUNTER — Other Ambulatory Visit: Payer: Self-pay

## 2020-12-25 ENCOUNTER — Encounter: Payer: Self-pay | Admitting: Internal Medicine

## 2020-12-25 VITALS — BP 148/88 | HR 82 | Temp 98.0°F | Ht 68.0 in | Wt 114.0 lb

## 2020-12-25 DIAGNOSIS — I10 Essential (primary) hypertension: Secondary | ICD-10-CM

## 2020-12-25 DIAGNOSIS — L609 Nail disorder, unspecified: Secondary | ICD-10-CM | POA: Diagnosis not present

## 2020-12-25 DIAGNOSIS — R600 Localized edema: Secondary | ICD-10-CM | POA: Diagnosis not present

## 2020-12-25 NOTE — Patient Instructions (Addendum)
Ok to cut back on fluids such as the gatorade  Please continue all other medications as before, and refills have been done if requested.  Please have the pharmacy call with any other refills you may need  Please keep your appointments with your specialists as you may have planned  You will be contacted regarding the referral for: podiatry (foot doctor)  Please make an Appointment to return in 3 months

## 2020-12-25 NOTE — Progress Notes (Signed)
Patient ID: Roberto Ros., male   DOB: 12/10/1940, 80 y.o.   MRN: [id number]        Chief Complaint: feet swelling, htn, and nail d/o       HPI:  Roberto Wallace. is a 80 y.o. male here with 1 wk worsening pedal edema with increased po fluids trying to avoid dehydration b/c someone told him it was important; BP has been < 140/90. Also has several painful nails to several toes for > 1 mo, asks for podiatry.  Pt denies chest pain, increased sob or doe, wheezing, orthopnea, PND,, palpitations, dizziness or syncope.   Pt denies polydipsia, polyuria, or new focal neuro s/s.   Pt denies fever, wt loss, night sweats, loss of appetite, or other constitutional symptoms    Wt Readings from Last 3 Encounters:  12/25/20 114 lb (51.7 kg)  12/12/20 114 lb (51.7 kg)  09/14/20 112 lb 7 oz (51 kg)   BP Readings from Last 3 Encounters:  12/25/20 (!) 148/88  12/24/20 (!) 142/92  12/12/20 (!) 180/110         Past Medical History:  Diagnosis Date  . Aneurysm (HCC)    per pt,in head  . Arthritis   . Chicken pox   . Depression   . Hypercholesteremia   . Hypertension   . Migraine   . PTSD (post-traumatic stress disorder) 06/24/2017  . Stroke (HCC)    No residual effects  . Syphilis   . Tuberculosis   . Vitamin D deficiency    Past Surgical History:  Procedure Laterality Date  . COLONOSCOPY    . HERNIA REPAIR Right    x 2    reports that he has been smoking cigarettes. He has a 25.00 pack-year smoking history. He has never used smokeless tobacco. He reports current alcohol use of about 2.0 - 3.0 standard drinks of alcohol per week. He reports that he does not use drugs. family history includes Dementia in his mother; Healthy in his father; Prostate cancer in his brother. Allergies  Allergen Reactions  . Amlodipine Rash   Current Outpatient Medications on File Prior to Visit  Medication Sig Dispense Refill  . Cholecalciferol (THERA-D 2000) 50 MCG (2000 UT) TABS 1 tab by mouth once  daily 90 tablet 99  . donepezil (ARICEPT) 10 MG tablet Take 1 tablet (10 mg total) by mouth at bedtime. 90 tablet 3  . hydrochlorothiazide (HYDRODIURIL) 25 MG tablet Take 1 tablet (25 mg total) by mouth daily. 90 tablet 3  . losartan (COZAAR) 100 MG tablet Take 1 tablet (100 mg total) by mouth daily. 90 tablet 3  . memantine (NAMENDA) 5 MG tablet Take 1 tablet (5 mg total) by mouth 2 (two) times daily. 180 tablet 3  . vitamin B-12 (CYANOCOBALAMIN) 100 MCG tablet Take 1 tablet (100 mcg total) by mouth daily. 90 tablet 3  . aspirin 325 MG tablet Take 1 tablet (325 mg total) by mouth daily. (Patient not taking: No sig reported) 30 tablet 0  . atorvastatin (LIPITOR) 20 MG tablet Take 1 tablet (20 mg total) by mouth daily at 6 PM. (Patient not taking: No sig reported) 90 tablet 3  . fexofenadine (ALLEGRA) 180 MG tablet Take 1 tablet (180 mg total) by mouth daily. (Patient not taking: No sig reported) 90 tablet 3  . PEG-KCl-NaCl-NaSulf-Na Asc-C (PLENVU) 140 g SOLR Use as directed (Patient not taking: No sig reported) 1 each 0  . sertraline (ZOLOFT) 25 MG tablet Take 1 tablet (  25 mg total) by mouth daily. Must keep appt w/new provider for future refills (Patient not taking: No sig reported) 90 tablet 3   No current facility-administered medications on file prior to visit.        ROS:  All others reviewed and negative.  Objective        PE:  BP (!) 148/88 (BP Location: Right Arm, Patient Position: Sitting, Cuff Size: Normal)   Pulse 82   Temp 98 F (36.7 C) (Oral)   Ht 5\' 8"  (1.727 m)   Wt 114 lb (51.7 kg)   SpO2 98%   BMI 17.33 kg/m                 Constitutional: Pt appears in NAD               HENT: Head: NCAT.                Right Ear: External ear normal.                 Left Ear: External ear normal.                Eyes: . Pupils are equal, round, and reactive to light. Conjunctivae and EOM are normal               Nose: without d/c or deformity               Neck: Neck supple. Gross  normal ROM               Cardiovascular: Normal rate and regular rhythm.                 Pulmonary/Chest: Effort normal and breath sounds without rales or wheezing.                Abd:  Soft, NT, ND, + BS, no organomegaly               Neurological: Pt is alert. At baseline orientation, motor grossly intact               Skin: Skin is warm. No rashes, no other new lesions, LE edema - trace pedal right > left               Psychiatric: Pt behavior is normal without agitation   Micro: none  Cardiac tracings I have personally interpreted today:  none  Pertinent Radiological findings (summarize): none   Lab Results  Component Value Date   WBC 7.8 12/12/2020   HGB 15.1 12/12/2020   HCT 45.9 12/12/2020   PLT 218.0 12/12/2020   GLUCOSE 108 (H) 12/12/2020   CHOL 184 12/12/2020   TRIG 58.0 12/12/2020   HDL 75.10 12/12/2020   LDLCALC 97 12/12/2020   ALT 14 12/12/2020   AST 18 12/12/2020   NA 140 12/12/2020   K 4.7 12/12/2020   CL 103 12/12/2020   CREATININE 0.98 12/12/2020   BUN 12 12/12/2020   CO2 29 12/12/2020   TSH 1.26 12/12/2020   PSA 2.77 12/12/2020   INR 0.99 10/16/2017   HGBA1C 5.1 12/12/2020   Assessment/Plan:  12/14/2020 [name] is a 80 y.o. Black or African American [2] male with  has a past medical history of Aneurysm (HCC), Arthritis, Chicken pox, Depression, Hypercholesteremia, Hypertension, Migraine, PTSD (post-traumatic stress disorder) (06/24/2017), Stroke (HCC), Syphilis, Tuberculosis, and Vitamin D deficiency.  Pedal edema C/w mild venous insufficiency - for leg elevation, reduced po fluids, low  salt diet, wt control  HYPERTENSION, BENIGN ESSENTIAL BP Readings from Last 3 Encounters:  12/25/20 (!) 148/88  12/24/20 (!) 142/92  12/12/20 (!) 180/110   Stable, pt to continue medical treatment  - hct, losartan, and declines further med tx change   Nail disorder Several painful - for podiatry referral  Followup: Return in about 3 months (around  03/27/2021).  Roberto Barre, MD 01/01/2021 8:45 PM [facility] [place] [facility] Medicine

## 2021-01-01 ENCOUNTER — Encounter: Payer: Self-pay | Admitting: Internal Medicine

## 2021-01-01 DIAGNOSIS — L609 Nail disorder, unspecified: Secondary | ICD-10-CM | POA: Insufficient documentation

## 2021-01-01 DIAGNOSIS — R600 Localized edema: Secondary | ICD-10-CM | POA: Insufficient documentation

## 2021-01-01 NOTE — Assessment & Plan Note (Signed)
C/w mild venous insufficiency - for leg elevation, reduced po fluids, low salt diet, wt control

## 2021-01-01 NOTE — Assessment & Plan Note (Signed)
BP Readings from Last 3 Encounters:  12/25/20 (!) 148/88  12/24/20 (!) 142/92  12/12/20 (!) 180/110   Stable, pt to continue medical treatment  - hct, losartan, and declines further med tx change

## 2021-01-01 NOTE — Assessment & Plan Note (Signed)
Several painful - for podiatry referral

## 2021-01-09 ENCOUNTER — Ambulatory Visit: Payer: Medicare PPO | Admitting: Physician Assistant

## 2021-01-09 ENCOUNTER — Other Ambulatory Visit: Payer: Self-pay

## 2021-01-09 VITALS — BP 160/111 | HR 72 | Temp 98.2°F | Resp 18 | Ht 70.0 in | Wt 117.0 lb

## 2021-01-09 DIAGNOSIS — R600 Localized edema: Secondary | ICD-10-CM

## 2021-01-09 DIAGNOSIS — L609 Nail disorder, unspecified: Secondary | ICD-10-CM

## 2021-01-09 NOTE — Patient Instructions (Addendum)
I encourage you to follow the advice of your primary care provider, making sure that you are keeping your feet elevated when able, making sure you are eating a low-sodium diet, and wear the compression stockings when able.  The phone number for [facility]:  [address] [place], [place] [address]  Phone: [phone]  Please let us know if there is anything else we can do for you  Roney Jaffe, PA-C Physician Assistant [facility] [email or web address]    Chronic Venous Insufficiency Chronic venous insufficiency is a condition where the leg veins cannot effectively pump blood from the legs to the heart. This happens when the vein walls are either stretched, weakened, or damaged, or when the valves inside the vein are damaged. With the right treatment, you should be able to continue with an active life. This condition is also called venous stasis. What are the causes? Common causes of this condition include:  High blood pressure inside the veins (venous hypertension).  Sitting or standing too long, causing increased blood pressure in the leg veins.  A blood clot that blocks blood flow in a vein (deep vein thrombosis, DVT).  Inflammation of a vein (phlebitis) that causes a blood clot to form.  Tumors in the pelvis that cause blood to back up. What increases the risk? The following factors may make you more likely to develop this condition:  Having a family history of this condition.  Obesity.  Pregnancy.  Living without enough regular physical activity or exercise (sedentary lifestyle).  Smoking.  Having a job that requires long periods of standing or sitting in one place.  Being a certain age. Women in their 77s and 96s and men in their 72s are more likely to develop this condition. What are the signs or symptoms? Symptoms of this condition include:  Veins that are enlarged, bulging, or twisted (varicose  veins).  Skin breakdown or ulcers.  Reddened skin or dark discoloration of skin on the leg between the knee and ankle.  Brown, smooth, tight, and painful skin just above the ankle, usually on the inside of the leg (lipodermatosclerosis).  Swelling of the legs. How is this diagnosed? This condition may be diagnosed based on:  Your medical history.  A physical exam.  Tests, such as: ? A procedure that creates an image of a blood vessel and nearby organs and provides information about blood flow through the blood vessel (duplex ultrasound). ? A procedure that tests blood flow (plethysmography). ? A procedure that looks at the veins using X-ray and dye (venogram). How is this treated? The goals of treatment are to help you return to an active life and to minimize pain or disability. Treatment depends on the severity of your condition, and it may include:  Wearing compression stockings. These can help relieve symptoms and help prevent your condition from getting worse. However, they do not cure the condition.  Sclerotherapy. This procedure involves an injection of a solution that shrinks damaged veins.  Surgery. This may involve: ? Removing a diseased vein (vein stripping). ? Cutting off blood flow through the vein (laser ablation surgery). ? Repairing or reconstructing a valve within the affected vein.   Follow these instructions at home:  Wear compression stockings as told by your health care provider. These stockings help to prevent blood clots and reduce swelling in your legs.  Take over-the-counter and prescription medicines only as told by your health care provider.  Stay active by exercising, walking, or doing different  activities. Ask your health care provider what activities are safe for you and how much exercise you need.  Drink enough fluid to keep your urine pale yellow.  Do not use any products that contain nicotine or tobacco, such as cigarettes, e-cigarettes, and  chewing tobacco. If you need help quitting, ask your health care provider.  Keep all follow-up visits as told by your health care provider. This is important.      Contact a health care provider if you:  Have redness, swelling, or more pain in the affected area.  See a red streak or line that goes up or down from the affected area.  Have skin breakdown or skin loss in the affected area, even if the breakdown is small.  Get an injury in the affected area. Get help right away if:  You get an injury and an open wound in the affected area.  You have: ? Severe pain that does not get better with medicine. ? Sudden numbness or weakness in the foot or ankle below the affected area. ? Trouble moving your foot or ankle. ? A fever. ? Worse or persistent symptoms. ? Chest pain. ? Shortness of breath. Summary  Chronic venous insufficiency is a condition where the leg veins cannot effectively pump blood from the legs to the heart.  Chronic venous insufficiency occurs when the vein walls become stretched, weakened, or damaged, or when valves within the vein are damaged.  Treatment depends on how severe your condition is. It often involves wearing compression stockings and may involve having a procedure.  Make sure you stay active by exercising, walking, or doing different activities. Ask your health care provider what activities are safe for you and how much exercise you need. This information is not intended to replace advice given to you by your health care provider. Make sure you discuss any questions you have with your health care provider. Document Revised: 05/04/2018 Document Reviewed: 05/04/2018 Elsevier Patient Education  2021 [employer].

## 2021-01-09 NOTE — Progress Notes (Signed)
New Patient Office Visit  Subjective:  Patient ID: Roberto Wallace., male    DOB: 12-Mar-1941  Age: 80 y.o. MRN: [id number]  CC:  Chief Complaint  Patient presents with  . Foot Pain    HPI [place] FANT [name] reports that he has been having swelling in his left leg, ankle and foot, states that it was swollen last night, states that it is painful when it is swollen.  Reports that it was much improved after sleeping.  Reports that he was seen by his primary care provider for the same complaint on Dec 25, 2020.  Reports at that time his primary care provider stated that the pedal edema was due to mild venous insufficiency, encouraged leg elevation, reducing p.o. fluids, low-salt diet and continue current hypertension regimen.  PCP did state in note that patient did not want to make any further medication treatment changes.  Reports that he is also waiting for referral to be seen by podiatry due to painful toenails on his left foot.  Reports that his primary care provider did start the referral   Past Medical History:  Diagnosis Date  . Aneurysm (HCC)    per pt,in head  . Arthritis   . Chicken pox   . Depression   . Hypercholesteremia   . Hypertension   . Migraine   . PTSD (post-traumatic stress disorder) 06/24/2017  . Stroke (HCC)    No residual effects  . Syphilis   . Tuberculosis   . Vitamin D deficiency     Past Surgical History:  Procedure Laterality Date  . COLONOSCOPY    . HERNIA REPAIR Right    x 2    Family History  Problem Relation Age of Onset  . Dementia Mother   . Healthy Father   . Prostate cancer Brother     Social History   Socioeconomic History  . Marital status: Widowed    Spouse name: Not on file  . Number of children: 2  . Years of education: 66  . Highest education level: Not on file  Occupational History  . Occupation: Retired  Tobacco Use  . Smoking status: Current Every Day Smoker    Packs/day: 0.50    Years: 50.00    Pack  years: 25.00    Types: Cigarettes  . Smokeless tobacco: Never Used  Vaping Use  . Vaping Use: Never used  Substance and Sexual Activity  . Alcohol use: Yes    Alcohol/week: 2.0 - 3.0 standard drinks    Types: 2 - 3 Shots of liquor per week    Comment: social, 3 per day  . Drug use: No  . Sexual activity: Never  Other Topics Concern  . Not on file  Social History Narrative   Fun: Sit on the back porch and relax.   Denies religious beliefs effecting health care.    Lives alone in a one story home.  Has 2 biological children and 3 adopted children.   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   Social Determinants of Health   Financial Resource Strain: Not on file  Food Insecurity: Not on file  Transportation Needs: Not on file  Physical Activity: Not on file  Stress: Not on file  Social Connections: Not on file  Intimate Partner Violence: Not on file    ROS Review of Systems  Constitutional: Negative for chills and fever.  HENT: Negative.   Eyes: Negative.   Respiratory: Negative for shortness of breath.  Cardiovascular: Positive for leg swelling. Negative for chest pain and palpitations.  Gastrointestinal: Negative.   Endocrine: Negative.   Genitourinary: Negative.   Musculoskeletal: Negative.   Skin: Negative.   Allergic/Immunologic: Negative.   Neurological: Negative for dizziness.  Hematological: Negative.   Psychiatric/Behavioral: Negative.     Objective:   Today's Vitals: BP (!) 160/111 (BP Location: Left Arm, Patient Position: Sitting, Cuff Size: Normal)   Pulse 72   Temp 98.2 F (36.8 C) (Oral)   Resp 18   Ht 5\' 10"  (1.778 m)   Wt 117 lb (53.1 kg)   SpO2 98%   BMI 16.79 kg/m   Physical Exam Vitals and nursing note reviewed.  Constitutional:      Appearance: Normal appearance.  HENT:     Head: Normocephalic and atraumatic.     Right Ear: External ear normal.     Left Ear: External ear normal.     Nose: Nose normal.     Mouth/Throat:      Mouth: Mucous membranes are moist.     Pharynx: Oropharynx is clear.  Eyes:     Extraocular Movements: Extraocular movements intact.     Conjunctiva/sclera: Conjunctivae normal.     Pupils: Pupils are equal, round, and reactive to light.  Cardiovascular:     Rate and Rhythm: Normal rate and regular rhythm.     Pulses: Normal pulses.     Heart sounds: Normal heart sounds.  Pulmonary:     Effort: Pulmonary effort is normal.     Breath sounds: Normal breath sounds.  Musculoskeletal:        General: Normal range of motion.     Cervical back: Normal range of motion and neck supple.     Right lower leg: No edema.     Left lower leg: No edema.     Comments: is present  Skin:    General: Skin is warm and dry.  Neurological:     General: No focal deficit present.     Mental Status: He is alert and oriented to person, place, and time.  Psychiatric:        Mood and Affect: Mood normal.        Behavior: Behavior normal.        Thought Content: Thought content normal.        Judgment: Judgment normal.     Assessment & Plan:   Problem List Items Addressed This Visit      Musculoskeletal and Integument   Nail disorder     Other   Pedal edema - Primary      Outpatient Encounter Medications as of 01/09/2021  Medication Sig  . Cholecalciferol (THERA-D 2000) 50 MCG (2000 UT) TABS 1 tab by mouth once daily  . donepezil (ARICEPT) 10 MG tablet Take 1 tablet (10 mg total) by mouth at bedtime.  . hydrochlorothiazide (HYDRODIURIL) 25 MG tablet Take 1 tablet (25 mg total) by mouth daily.  01/11/2021 losartan (COZAAR) 100 MG tablet Take 1 tablet (100 mg total) by mouth daily.  . memantine (NAMENDA) 5 MG tablet Take 1 tablet (5 mg total) by mouth 2 (two) times daily.  . vitamin B-12 (CYANOCOBALAMIN) 100 MCG tablet Take 1 tablet (100 mcg total) by mouth daily.  Marland Kitchen aspirin 325 MG tablet Take 1 tablet (325 mg total) by mouth daily. (Patient not taking: No sig reported)  . atorvastatin (LIPITOR)  20 MG tablet Take 1 tablet (20 mg total) by mouth daily at 6 PM. (Patient not taking:  No sig reported)  . fexofenadine (ALLEGRA) 180 MG tablet Take 1 tablet (180 mg total) by mouth daily. (Patient not taking: No sig reported)  . PEG-KCl-NaCl-NaSulf-Na Asc-C (PLENVU) 140 g SOLR Use as directed (Patient not taking: No sig reported)  . sertraline (ZOLOFT) 25 MG tablet Take 1 tablet (25 mg total) by mouth daily. Must keep appt w/new provider for future refills (Patient not taking: No sig reported)   No facility-administered encounter medications on file as of 01/09/2021.   1. Pedal edema Encourage patient to continue follow-up with primary care provider, reviewed primary care providers plan with patient, patient understands and agrees. Red flags given for prompt reevaluation  2. Nail disorder Patient was given contact information for [facility], provider had attempted contact with patient.   I have reviewed the patient's medical history (PMH, PSH, Social History, Family History, Medications, and allergies) , and have been updated if relevant. I spent 20 minutes reviewing chart and  face to face time with patient.     Follow-up: Return if symptoms worsen or fail to improve.   Kasandra Knudsen Mayers, PA-C

## 2021-01-09 NOTE — Progress Notes (Signed)
Patient has not taken medication today. Patient has only had coffee this morning. Patient reports swelling in the left lower extremity last night with pain radiating up the foot and calf. Patient uses a cane for assistance.

## 2021-01-10 ENCOUNTER — Ambulatory Visit: Payer: Medicare PPO | Admitting: Internal Medicine

## 2021-01-16 ENCOUNTER — Telehealth: Payer: Self-pay | Admitting: Internal Medicine

## 2021-01-16 NOTE — Telephone Encounter (Signed)
Patient called and said that he is no longer needing the referral to podiatry. He said that he is doing okay. Please advise

## 2021-01-16 NOTE — Telephone Encounter (Signed)
Mrs. Joanne Gavel from [employer] called   Regarding FL2 form. The last signed FL2 was 12/18/2020 and is expiring. Requesting a extension/ re-signed FL2 so that patient can be placed in a home. Deadline is the 01/23/2021 so the form needs to be completed and faxed by 01/21/2021.   Please advise and call back at [phone].   Alternate contact is [employer] [phone]

## 2021-01-16 NOTE — Telephone Encounter (Signed)
I am not sure what to do with this

## 2021-01-30 DIAGNOSIS — H2511 Age-related nuclear cataract, right eye: Secondary | ICD-10-CM | POA: Diagnosis not present

## 2021-01-30 DIAGNOSIS — H401123 Primary open-angle glaucoma, left eye, severe stage: Secondary | ICD-10-CM | POA: Diagnosis not present

## 2021-02-06 NOTE — Telephone Encounter (Signed)
Spoke with Mrs. Roberto Wallace they are requesting a 30 day extension and will be refaxing the forms.

## 2021-02-06 NOTE — Telephone Encounter (Signed)
Mrs. Joanne Gavel from [employer] called again and said    Regarding FL2 form. The last signed FL2 was 12/18/2020 and is expiring. Requesting a extension/ re-signed FL2 so that patient can be placed in a home.  Carolyne Fiscal: [phone] Fax: [phone]

## 2021-02-28 ENCOUNTER — Telehealth: Payer: Self-pay | Admitting: Internal Medicine

## 2021-02-28 NOTE — Telephone Encounter (Signed)
LVM for pt to rtn my call to schedule AWV with [facility]. Please schedule awv if pt calls the office.  

## 2021-03-18 ENCOUNTER — Emergency Department (HOSPITAL_COMMUNITY): Payer: Medicare PPO

## 2021-03-18 ENCOUNTER — Emergency Department (HOSPITAL_COMMUNITY)
Admission: EM | Admit: 2021-03-18 | Discharge: 2021-03-19 | Disposition: A | Discharge status: Home / Self Care | Payer: Medicare PPO | Attending: Student | Admitting: Student

## 2021-03-18 DIAGNOSIS — G9389 Other specified disorders of brain: Secondary | ICD-10-CM | POA: Diagnosis not present

## 2021-03-18 DIAGNOSIS — Z5321 Procedure and treatment not carried out due to patient leaving prior to being seen by health care provider: Secondary | ICD-10-CM | POA: Diagnosis not present

## 2021-03-18 DIAGNOSIS — R000 Tachycardia, unspecified: Secondary | ICD-10-CM | POA: Diagnosis not present

## 2021-03-18 DIAGNOSIS — G319 Degenerative disease of nervous system, unspecified: Secondary | ICD-10-CM | POA: Diagnosis not present

## 2021-03-18 DIAGNOSIS — I499 Cardiac arrhythmia, unspecified: Secondary | ICD-10-CM | POA: Diagnosis not present

## 2021-03-18 DIAGNOSIS — R42 Dizziness and giddiness: Secondary | ICD-10-CM | POA: Insufficient documentation

## 2021-03-18 DIAGNOSIS — G238 Other specified degenerative diseases of basal ganglia: Secondary | ICD-10-CM | POA: Diagnosis not present

## 2021-03-18 DIAGNOSIS — J439 Emphysema, unspecified: Secondary | ICD-10-CM | POA: Diagnosis not present

## 2021-03-18 DIAGNOSIS — R55 Syncope and collapse: Secondary | ICD-10-CM | POA: Insufficient documentation

## 2021-03-18 DIAGNOSIS — R0602 Shortness of breath: Secondary | ICD-10-CM | POA: Diagnosis not present

## 2021-03-18 DIAGNOSIS — I491 Atrial premature depolarization: Secondary | ICD-10-CM | POA: Diagnosis not present

## 2021-03-18 DIAGNOSIS — R0689 Other abnormalities of breathing: Secondary | ICD-10-CM | POA: Diagnosis not present

## 2021-03-18 LAB — CBC WITH DIFFERENTIAL/PLATELET
Abs Immature Granulocytes: 0.03 10*3/uL (ref 0.00–0.07)
Basophils Absolute: 0 10*3/uL (ref 0.0–0.1)
Basophils Relative: 0 %
Eosinophils Absolute: 0.5 10*3/uL (ref 0.0–0.5)
Eosinophils Relative: 7 %
HCT: 45 % (ref 39.0–52.0)
Hemoglobin: 14.5 g/dL (ref 13.0–17.0)
Immature Granulocytes: 0 %
Lymphocytes Relative: 28 %
Lymphs Abs: 2.2 10*3/uL (ref 0.7–4.0)
MCH: 29.2 pg (ref 26.0–34.0)
MCHC: 32.2 g/dL (ref 30.0–36.0)
MCV: 90.5 fL (ref 80.0–100.0)
Monocytes Absolute: 0.7 10*3/uL (ref 0.1–1.0)
Monocytes Relative: 8 %
Neutro Abs: 4.4 10*3/uL (ref 1.7–7.7)
Neutrophils Relative %: 57 %
Platelets: 195 10*3/uL (ref 150–400)
RBC: 4.97 MIL/uL (ref 4.22–5.81)
RDW: 12.8 % (ref 11.5–15.5)
WBC: 7.8 10*3/uL (ref 4.0–10.5)
nRBC: 0 % (ref 0.0–0.2)

## 2021-03-18 LAB — COMPREHENSIVE METABOLIC PANEL
ALT: 12 U/L (ref 0–44)
AST: 14 U/L — ABNORMAL LOW (ref 15–41)
Albumin: 3.7 g/dL (ref 3.5–5.0)
Alkaline Phosphatase: 79 U/L (ref 38–126)
Anion gap: 9 (ref 5–15)
BUN: 18 mg/dL (ref 8–23)
CO2: 26 mmol/L (ref 22–32)
Calcium: 9.4 mg/dL (ref 8.9–10.3)
Chloride: 99 mmol/L (ref 98–111)
Creatinine, Ser: 0.89 mg/dL (ref 0.61–1.24)
GFR, Estimated: >60 mL/min (ref >60)
Glucose, Bld: 92 mg/dL (ref 70–99)
Potassium: 4.3 mmol/L (ref 3.5–5.1)
Sodium: 134 mmol/L — ABNORMAL LOW (ref 135–145)
Total Bilirubin: 0.6 mg/dL (ref 0.3–1.2)
Total Protein: 7.4 g/dL (ref 6.5–8.1)

## 2021-03-18 LAB — TROPONIN I (HIGH SENSITIVITY)
Troponin I (High Sensitivity): 13 ng/L (ref <18)
Troponin I (High Sensitivity): 16 ng/L (ref <18)

## 2021-03-18 NOTE — ED Provider Notes (Signed)
Emergency Medicine Provider Triage Evaluation Note  Roberto Ros. , a 80 y.o. male  was evaluated in triage.  Pt complains of dizziness and SOB.  Patient states that today he was driving, started feeling dizzy and significantly short of breath and then he came to the emergency department.  Patient states that dizziness and shortness of breath have mostly resolved.  Patient states that he does have a long history of dizziness for the past 25 years since he has had multiple strokes.  Is currently asymptomatic.  Denies any pain anywhere.  Patient describes dizziness as room spinning sensation that lasted minutes.  Does not have history of vertigo.  Patient states that he almost passed out, states that he did not actually lose consciousness.  Review of Systems  Positive: SOB and dizziness  Negative: Chest pain  Physical Exam  There were no vitals taken for this visit. Gen:   Awake, no distress   Resp:  Normal effort  MSK:   Moves extremities without difficulty  Other:  Alert. Clear speech. No facial droop. CNIII-XII grossly intact. Bilateral upper and lower extremities' sensation grossly intact. 5/5 symmetric strength with grip strength and with plantar and dorsi flexion bilaterally. Patellar DTRs are 2+ and symmetric . Marland Kitchen Negative pronator drift.  Medical Decision Making  Medically screening exam initiated at 3:50 PM.  Appropriate orders placed.  Roberto Hatchet [name] was informed that the remainder of the evaluation will be completed by another provider, this initial triage assessment does not replace that evaluation, and the importance of remaining in the ED until their evaluation is complete.     Roberto Gordon, PA-C 03/18/21 0865    Roberto Bale, MD 03/19/21 2204

## 2021-03-18 NOTE — ED Triage Notes (Signed)
Pt from [facility] with ems c.o dizziness and sob. HR 120 irregular with PVCs HR 180 when standing. ETCO2: 25  BP 128/80 Cbg 113 HR 90 after 1L fluid

## 2021-03-19 NOTE — ED Notes (Signed)
Pt states that he is leaving.  

## 2021-03-19 NOTE — ED Notes (Signed)
Pt called back in and states that he left his keys in the room where he had imaging done. Called to CT, they do not remember the patient perhaps he was with X-ray. Spoke with X-ray and they state that they would have brought anything down if they had found it, however they will take another look and call back if they find anything.

## 2021-05-10 ENCOUNTER — Encounter: Payer: Medicare PPO | Admitting: *Deleted

## 2021-05-15 ENCOUNTER — Emergency Department (HOSPITAL_COMMUNITY)
Admission: EM | Admit: 2021-05-15 | Discharge: 2021-05-15 | Disposition: A | Discharge status: Home / Self Care | Payer: Medicare Other | Attending: Student | Admitting: Student

## 2021-05-15 ENCOUNTER — Other Ambulatory Visit: Payer: Self-pay

## 2021-05-15 ENCOUNTER — Emergency Department (HOSPITAL_COMMUNITY): Payer: Medicare Other

## 2021-05-15 ENCOUNTER — Encounter (HOSPITAL_COMMUNITY): Payer: Self-pay

## 2021-05-15 DIAGNOSIS — I10 Essential (primary) hypertension: Secondary | ICD-10-CM | POA: Diagnosis not present

## 2021-05-15 DIAGNOSIS — R079 Chest pain, unspecified: Secondary | ICD-10-CM | POA: Diagnosis not present

## 2021-05-15 DIAGNOSIS — R0902 Hypoxemia: Secondary | ICD-10-CM | POA: Diagnosis not present

## 2021-05-15 DIAGNOSIS — R42 Dizziness and giddiness: Secondary | ICD-10-CM | POA: Diagnosis not present

## 2021-05-15 DIAGNOSIS — J439 Emphysema, unspecified: Secondary | ICD-10-CM | POA: Diagnosis not present

## 2021-05-15 DIAGNOSIS — Z79899 Other long term (current) drug therapy: Secondary | ICD-10-CM | POA: Diagnosis not present

## 2021-05-15 DIAGNOSIS — Z7982 Long term (current) use of aspirin: Secondary | ICD-10-CM | POA: Diagnosis not present

## 2021-05-15 DIAGNOSIS — R55 Syncope and collapse: Secondary | ICD-10-CM | POA: Insufficient documentation

## 2021-05-15 DIAGNOSIS — F17210 Nicotine dependence, cigarettes, uncomplicated: Secondary | ICD-10-CM | POA: Diagnosis not present

## 2021-05-15 DIAGNOSIS — R0602 Shortness of breath: Secondary | ICD-10-CM | POA: Diagnosis not present

## 2021-05-15 DIAGNOSIS — R531 Weakness: Secondary | ICD-10-CM | POA: Diagnosis not present

## 2021-05-15 LAB — URINALYSIS, ROUTINE W REFLEX MICROSCOPIC
Bacteria, UA: NONE SEEN
Bilirubin Urine: NEGATIVE
Glucose, UA: 100 mg/dL — AB
Hgb urine dipstick: NEGATIVE
Leukocytes,Ua: NEGATIVE
Nitrite: NEGATIVE
Protein, ur: 30 mg/dL — AB
Specific Gravity, Urine: 1.02 (ref 1.005–1.030)
pH: 6 (ref 5.0–8.0)

## 2021-05-15 LAB — COMPREHENSIVE METABOLIC PANEL
ALT: 12 U/L (ref 0–44)
AST: 17 U/L (ref 15–41)
Albumin: 4.2 g/dL (ref 3.5–5.0)
Alkaline Phosphatase: 80 U/L (ref 38–126)
Anion gap: 10 (ref 5–15)
BUN: 14 mg/dL (ref 8–23)
CO2: 25 mmol/L (ref 22–32)
Calcium: 9 mg/dL (ref 8.9–10.3)
Chloride: 100 mmol/L (ref 98–111)
Creatinine, Ser: 0.98 mg/dL (ref 0.61–1.24)
GFR, Estimated: >60 mL/min (ref >60)
Glucose, Bld: 119 mg/dL — ABNORMAL HIGH (ref 70–99)
Potassium: 3.4 mmol/L — ABNORMAL LOW (ref 3.5–5.1)
Sodium: 135 mmol/L (ref 135–145)
Total Bilirubin: 0.6 mg/dL (ref 0.3–1.2)
Total Protein: 8.3 g/dL — ABNORMAL HIGH (ref 6.5–8.1)

## 2021-05-15 LAB — CBC WITH DIFFERENTIAL/PLATELET
Abs Immature Granulocytes: 0.01 10*3/uL (ref 0.00–0.07)
Basophils Absolute: 0 10*3/uL (ref 0.0–0.1)
Basophils Relative: 0 %
Eosinophils Absolute: 0.5 10*3/uL (ref 0.0–0.5)
Eosinophils Relative: 6 %
HCT: 44.5 % (ref 39.0–52.0)
Hemoglobin: 14.1 g/dL (ref 13.0–17.0)
Immature Granulocytes: 0 %
Lymphocytes Relative: 17 %
Lymphs Abs: 1.3 10*3/uL (ref 0.7–4.0)
MCH: 29.3 pg (ref 26.0–34.0)
MCHC: 31.7 g/dL (ref 30.0–36.0)
MCV: 92.3 fL (ref 80.0–100.0)
Monocytes Absolute: 0.6 10*3/uL (ref 0.1–1.0)
Monocytes Relative: 7 %
Neutro Abs: 5.7 10*3/uL (ref 1.7–7.7)
Neutrophils Relative %: 70 %
Platelets: 196 10*3/uL (ref 150–400)
RBC: 4.82 MIL/uL (ref 4.22–5.81)
RDW: 13.2 % (ref 11.5–15.5)
WBC: 8.1 10*3/uL (ref 4.0–10.5)
nRBC: 0 % (ref 0.0–0.2)

## 2021-05-15 LAB — TROPONIN I (HIGH SENSITIVITY)
Troponin I (High Sensitivity): 3 ng/L (ref <18)
Troponin I (High Sensitivity): 3 ng/L (ref <18)

## 2021-05-15 MED ORDER — POTASSIUM CHLORIDE CRYS ER 20 MEQ PO TBCR
40.0000 meq | EXTENDED_RELEASE_TABLET | Freq: Once | ORAL | Status: AC
  Administered 2021-05-15: 40 meq via ORAL
  Filled 2021-05-15: qty 4

## 2021-05-15 MED ORDER — SODIUM CHLORIDE 0.9 % IV BOLUS
1000.0000 mL | Freq: Once | INTRAVENOUS | Status: AC
  Administered 2021-05-15: 1000 mL via INTRAVENOUS

## 2021-05-15 MED ORDER — MAGNESIUM OXIDE -MG SUPPLEMENT 400 (240 MG) MG PO TABS
800.0000 mg | ORAL_TABLET | Freq: Once | ORAL | Status: AC
  Administered 2021-05-15: 800 mg via ORAL
  Filled 2021-05-15: qty 2

## 2021-05-15 NOTE — ED Provider Notes (Signed)
Emergency Medicine Provider Triage Evaluation Note  Roberto Wallace. , a 80 y.o. male  was evaluated in triage.  Pt complains of shortness of breath.  States his symptoms started about 30 minutes ago.  Reports history of COPD and has no home oxygen requirement.  States that he became lightheaded, dizzy, and had a near syncopal episode when his symptoms started.  Denies any chest pain.  States his symptoms have improved mildly since coming to the emergency department.  Physical Exam  BP 97/70   Pulse 72   Temp 97.7 F (36.5 C) (Oral)   Resp 16   Ht 5\' 10"  (1.778 m)   Wt 53.1 kg   SpO2 100%   BMI 16.80 kg/m  Gen:   Awake, no distress   Resp:  Normal effort  MSK:   Moves extremities without difficulty  Other:    Medical Decision Making  Medically screening exam initiated at 12:30 PM.  Appropriate orders placed.  [name] was informed that the remainder of the evaluation will be completed by another provider, this initial triage assessment does not replace that evaluation, and the importance of remaining in the ED until their evaluation is complete.   Velna Hatchet, PA-C 05/15/21 1231    05/17/21, DO 05/16/21 [phone]

## 2021-05-15 NOTE — ED Provider Notes (Signed)
[facility] Provider Note   CSN: [id number] Arrival date & time: 05/15/21  1209     History Chief Complaint  Patient presents with   Chest Pain    Roberto Wallace. is a 80 y.o. male with history of stroke and colon cancer who presents to the emergency department today for further evaluation of a near syncope episode that occurred at approximately 9:00 this morning.  He states that he just got done having a bowel movement and was walking back to his chair and upon sitting down he became very dizzy like he was going to pass out with associated palpitations characterized as a pounding sensation, shortness of breath, nausea, and vomiting.  He denies ever losing consciousness and he states that he has been having the symptoms intermittently for the past several years.  He denies any chest pain, fever, chills, cough, congestion, abdominal pain, diarrhea, leg pain, leg swelling at this time.  He is currently asymptomatic.  The history is provided by the patient. No language interpreter was used.  Chest Pain     Past Medical History:  Diagnosis Date   Aneurysm (HCC)    per pt,in head   Arthritis    Chicken pox    Depression    Hypercholesteremia    Hypertension    Migraine    PTSD (post-traumatic stress disorder) 06/24/2017   Stroke (HCC)    No residual effects   Syphilis    Tuberculosis    Vitamin D deficiency     Patient Active Problem List   Diagnosis Date Noted   Pedal edema 01/01/2021   Nail disorder 01/01/2021   Homelessness 12/12/2020   Microhematuria 02/01/2020   Allergic rhinitis 02/01/2020   History of colonic polyps 02/01/2020   Vitamin D deficiency 03/12/2019   B12 deficiency 03/12/2019   Hyperglycemia 03/10/2019   Cough 03/10/2019   History of exposure to asbestos 03/10/2019   Hyperlipidemia 10/16/2017   Depression 10/16/2017   Stroke-like symptoms 10/16/2017   Mild dementia (HCC) 10/16/2017   Lateral epicondylitis,  right elbow 06/26/2017   PTSD (post-traumatic stress disorder) 06/24/2017   Right wrist pain 06/09/2016   Osteoarthritis 02/05/2016   Tuberculosis 02/05/2016   Loss of weight 10/31/2015   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   Neuropathy 10/03/2015   Intermittent memory loss 10/03/2015   Decreased mobility 10/03/2015   Encounter for well adult exam with abnormal findings 04/16/2015   Medicare annual wellness visit, subsequent 04/16/2015   TOBACCO USER 06/14/2010   Anxiety and depression 11/19/2009   HYPERTENSION, BENIGN ESSENTIAL 11/19/2009   Other specified cardiac dysrhythmias(427.89) 11/19/2009   GEN OSTEOARTHROSIS INVOLVING MULTIPLE SITES 11/19/2009   History of cardiovascular disorder 11/19/2009    Past Surgical History:  Procedure Laterality Date   COLONOSCOPY     HERNIA REPAIR Right    x 2       Family History  Problem Relation Age of Onset   Dementia Mother    Healthy Father    Prostate cancer Brother     Social History   Tobacco Use   Smoking status: Every Day    Packs/day: 0.50    Years: 50.00    Pack years: 25.00    Types: Cigarettes   Smokeless tobacco: Never  Vaping Use   Vaping Use: Never used  Substance Use Topics   Alcohol use: Yes    Alcohol/week: 2.0 - 3.0 standard drinks    Types: 2 - 3 Shots of  liquor per week    Comment: social, 3 per day   Drug use: No    Home Medications Prior to Admission medications   Medication Sig Start Date End Date Taking? Authorizing Provider  aspirin 325 MG tablet Take 1 tablet (325 mg total) by mouth daily. Patient not taking: No sig reported 10/18/17   Darlin Drop, DO  atorvastatin (LIPITOR) 20 MG tablet Take 1 tablet (20 mg total) by mouth daily at 6 PM. Patient not taking: No sig reported 02/01/20   Corwin Levins, MD  Cholecalciferol (THERA-D 2000) 50 MCG (2000 UT) TABS 1 tab by mouth once daily 12/13/20   Corwin Levins, MD  donepezil (ARICEPT) 10 MG tablet Take 1 tablet  (10 mg total) by mouth at bedtime. 12/12/20   Corwin Levins, MD  fexofenadine (ALLEGRA) 180 MG tablet Take 1 tablet (180 mg total) by mouth daily. Patient not taking: No sig reported 02/01/20 01/31/21  Corwin Levins, MD  hydrochlorothiazide (HYDRODIURIL) 25 MG tablet Take 1 tablet (25 mg total) by mouth daily. 12/12/20   Corwin Levins, MD  losartan (COZAAR) 100 MG tablet Take 1 tablet (100 mg total) by mouth daily. 12/12/20   Corwin Levins, MD  memantine (NAMENDA) 5 MG tablet Take 1 tablet (5 mg total) by mouth 2 (two) times daily. 12/12/20   Corwin Levins, MD  PEG-KCl-NaCl-NaSulf-Na Asc-C (PLENVU) 140 g SOLR Use as directed Patient not taking: No sig reported 05/07/20   Sherrilyn Rist, MD  sertraline (ZOLOFT) 25 MG tablet Take 1 tablet (25 mg total) by mouth daily. Must keep appt w/new provider for future refills Patient not taking: No sig reported 02/01/20   Corwin Levins, MD  vitamin B-12 (CYANOCOBALAMIN) 100 MCG tablet Take 1 tablet (100 mcg total) by mouth daily. 12/13/20   Corwin Levins, MD    Allergies    Pollen extract and Amlodipine  Review of Systems   Review of Systems  Cardiovascular:  Positive for chest pain.  All other systems reviewed and are negative.  Physical Exam Updated Vital Signs BP 118/86   Pulse 75   Temp 97.7 F (36.5 C) (Oral)   Resp 20   Ht 5\' 10"  (1.778 m)   Wt 53.1 kg   SpO2 100%   BMI 16.80 kg/m   Physical Exam Constitutional:      General: He is not in acute distress.    Appearance: Normal appearance.  HENT:     Head: Normocephalic and atraumatic.  Eyes:     General:        Right eye: No discharge.        Left eye: No discharge.  Cardiovascular:     Comments: Regular rate and rhythm.  S1/S2 are distinct without any evidence of murmur, rubs, or gallops.  Radial pulses are 2+ bilaterally.  Dorsalis pedis pulses are 2+ bilaterally.  No evidence of pedal edema. Pulmonary:     Comments: Normal effort.  Mild diffuse expiratory wheeze.  Symmetrical  breath sounds Abdominal:     General: Abdomen is flat. Bowel sounds are normal. There is no distension.     Tenderness: There is no abdominal tenderness. There is no guarding or rebound.  Musculoskeletal:        General: Normal range of motion.     Cervical back: Neck supple.  Skin:    General: Skin is warm and dry.     Findings: No rash.  Neurological:  General: No focal deficit present.     Mental Status: He is alert.  Psychiatric:        Mood and Affect: Mood normal.        Behavior: Behavior normal.    ED Results / Procedures / Treatments   Labs (all labs ordered are listed, but only abnormal results are displayed) Labs Reviewed  COMPREHENSIVE METABOLIC PANEL - Abnormal; Notable for the following components:      Result Value   Potassium 3.4 (*)    Glucose, Bld 119 (*)    Total Protein 8.3 (*)    All other components within normal limits  URINALYSIS, ROUTINE W REFLEX MICROSCOPIC - Abnormal; Notable for the following components:   Glucose, UA 100 (*)    Ketones, ur TRACE (*)    Protein, ur 30 (*)    All other components within normal limits  CBC WITH DIFFERENTIAL/PLATELET  TROPONIN I (HIGH SENSITIVITY)  TROPONIN I (HIGH SENSITIVITY)    EKG None  Radiology DG Chest 2 View  Result Date: 05/15/2021 CLINICAL DATA:  Chest pain, shortness of breath, history hypertension, TB, smoker, aneurysm EXAM: CHEST - 2 VIEW COMPARISON:  03/18/2021 FINDINGS: Normal heart size, mediastinal contours, and pulmonary vascularity. Atherosclerotic calcification aorta. Lungs appear emphysematous with minimal linear scarring in the RIGHT upper lobe. No acute infiltrate, pleural effusion, or pneumothorax. Multiple old LEFT rib fractures. No acute osseous findings. IMPRESSION: No acute abnormalities. Aortic Atherosclerosis (ICD10-I70.0) and Emphysema (ICD10-J43.9). Electronically Signed   By: Ulyses Southward M.D.   On: 05/15/2021 13:03   CT HEAD WO CONTRAST ( )  Result Date: 05/15/2021 CLINICAL  DATA:  Dizziness, shortness of breath, lightheaded, near syncopal episode EXAM: CT HEAD WITHOUT CONTRAST TECHNIQUE: Contiguous axial images were obtained from the base of the skull through the vertex without intravenous contrast. COMPARISON:  03/18/2021 FINDINGS: Brain: Stable atrophy and chronic white microvascular ischemic changes throughout both cerebral hemispheres. Remote left thalamus lacunar type infarct. No acute intracranial hemorrhage, definite new infarction, mass lesion, midline shift, herniation, hydrocephalus. Cerebellar atrophy as well. No focal mass effect or. Cisterns are patent. Vascular: No hyperdense vessel or unexpected calcification. Skull: Normal. Negative for fracture or focal lesion. Sinuses/Orbits: No acute finding. Other: None. IMPRESSION: Stable atrophy and white matter microvascular ischemic changes. No acute intracranial abnormality or interval change by noncontrast CT. Electronically Signed   By: Judie Petit.  Shick M.D.   On: 05/15/2021 13:17    Procedures Procedures   Medications Ordered in ED Medications  sodium chloride 0.9 % bolus 1,000 mL (1,000 mLs Intravenous New Bag/Given 05/15/21 1830)  magnesium oxide (MAG-OX) tablet 800 mg (800 mg Oral Given 05/15/21 1936)  potassium chloride SA (KLOR-CON) CR tablet 40 mEq (40 mEq Oral Given 05/15/21 1936)    ED Course  I have reviewed the triage vital signs and the nursing notes.  Pertinent labs & imaging results that were available during my care of the patient were reviewed by me and considered in my medical decision making (see chart for details).  Clinical Course as of 05/15/21 2004  Wed May 15, 2021  1811 Spoke with Dr. Fayrene Fearing his PCP. Will have him follow up for presyncope cardiology evaluation if patient feels better after fluid resuscitation and is ambulatory without symptoms.  [CF]  1822 I discussed this case with my attending physician who cosigned this note including patient's presenting symptoms, physical exam, and  planned diagnostics and interventions. Attending physician stated agreement with plan or made changes to plan which were implemented.  Attending physician assessed patient at bedside.   [CF]    Clinical Course User Index [CF] Jolyn Lent   MDM Rules/Calculators/A&P                          Roberto DETTER [name] is a 80 y.o. male presents the emergency room today for further evaluation of a near syncopal episode that occurred earlier this morning.  EKG is without signs of active ischemia.  Single troponin and delta troponin were both negative.  Have a low suspicion for NSTEMI at this time.  Patient has not been tachypneic or tachycardic.  Presentation not consistent with acute pulmonary embolism.   CMP is grossly normal.  CBC is normal.  CT head is without any acute intracranial abnormality.  Chest x-ray is clear.  Given the clinical scenario, this is likely due to dehydration versus vasovagal.  Patient states he is feeling much better than he did when he first arrived and after fluid resuscitation. Potassium and Mag were repleted in the department. Strict return precautions given.  I will have him follow-up with his primary care provider.  Final Clinical Impression(s) / ED Diagnoses Final diagnoses:  Near syncope    Rx / DC Orders ED Discharge Orders     None        Jolyn Lent 05/15/21 2005    Glendora Score, MD 05/16/21 0000

## 2021-05-15 NOTE — ED Triage Notes (Signed)
BIB EMS for CP that is resolved now and [employer] day.

## 2021-05-15 NOTE — Discharge Instructions (Addendum)
You were seen and evaluated in the emergency department today for further evaluation of near syncope.  As we discussed, this is likely due to dehydration or after having a bowel movement.  Drink plenty of fluids.  Take your time getting up after having a bowel movement.  Please return to the emergency department if you experience worsening symptoms, loss of consciousness, weakness/numbness in the upper or lower extremity, slurred speech, new and severe headache, or any other concerns you might have.  Please follow-up with your primary care provider within the next week for further evaluation.

## 2021-05-30 ENCOUNTER — Telehealth: Payer: Self-pay | Admitting: Internal Medicine

## 2021-05-30 NOTE — Telephone Encounter (Signed)
Need OV please

## 2021-05-31 NOTE — Telephone Encounter (Signed)
Called patient and made an OV for 06/03/21 to discuss CT scan.

## 2021-06-03 ENCOUNTER — Encounter: Payer: Self-pay | Admitting: Internal Medicine

## 2021-06-03 ENCOUNTER — Ambulatory Visit (INDEPENDENT_AMBULATORY_CARE_PROVIDER_SITE_OTHER): Payer: Medicare Other | Admitting: Internal Medicine

## 2021-06-03 ENCOUNTER — Encounter: Payer: Self-pay | Admitting: *Deleted

## 2021-06-03 ENCOUNTER — Other Ambulatory Visit: Payer: Self-pay

## 2021-06-03 VITALS — BP 126/78 | HR 84 | Ht 70.0 in | Wt 114.0 lb

## 2021-06-03 DIAGNOSIS — R55 Syncope and collapse: Secondary | ICD-10-CM | POA: Insufficient documentation

## 2021-06-03 DIAGNOSIS — I10 Essential (primary) hypertension: Secondary | ICD-10-CM | POA: Diagnosis not present

## 2021-06-03 DIAGNOSIS — F17200 Nicotine dependence, unspecified, uncomplicated: Secondary | ICD-10-CM | POA: Diagnosis not present

## 2021-06-03 DIAGNOSIS — E785 Hyperlipidemia, unspecified: Secondary | ICD-10-CM | POA: Diagnosis not present

## 2021-06-03 DIAGNOSIS — R739 Hyperglycemia, unspecified: Secondary | ICD-10-CM | POA: Diagnosis not present

## 2021-06-03 DIAGNOSIS — E538 Deficiency of other specified B group vitamins: Secondary | ICD-10-CM | POA: Diagnosis not present

## 2021-06-03 DIAGNOSIS — E559 Vitamin D deficiency, unspecified: Secondary | ICD-10-CM | POA: Diagnosis not present

## 2021-06-03 LAB — HEMOGLOBIN A1C: Hgb A1c MFr Bld: 5.6 % (ref 4.6–6.5)

## 2021-06-03 LAB — CBC WITH DIFFERENTIAL/PLATELET
Basophils Absolute: 0 10*3/uL (ref 0.0–0.1)
Basophils Relative: 0.3 % (ref 0.0–3.0)
Eosinophils Absolute: 0.6 10*3/uL (ref 0.0–0.7)
Eosinophils Relative: 9.9 % — ABNORMAL HIGH (ref 0.0–5.0)
HCT: 40.7 % (ref 39.0–52.0)
Hemoglobin: 13.3 g/dL (ref 13.0–17.0)
Lymphocytes Relative: 25.7 % (ref 12.0–46.0)
Lymphs Abs: 1.5 10*3/uL (ref 0.7–4.0)
MCHC: 32.8 g/dL (ref 30.0–36.0)
MCV: 89.8 fl (ref 78.0–100.0)
Monocytes Absolute: 0.5 10*3/uL (ref 0.1–1.0)
Monocytes Relative: 8.3 % (ref 3.0–12.0)
Neutro Abs: 3.2 10*3/uL (ref 1.4–7.7)
Neutrophils Relative %: 55.8 % (ref 43.0–77.0)
Platelets: 214 10*3/uL (ref 150.0–400.0)
RBC: 4.53 Mil/uL (ref 4.22–5.81)
RDW: 13.7 % (ref 11.5–15.5)
WBC: 5.7 10*3/uL (ref 4.0–10.5)

## 2021-06-03 LAB — HEPATIC FUNCTION PANEL
ALT: 9 U/L (ref 0–53)
AST: 12 U/L (ref 0–37)
Albumin: 4.2 g/dL (ref 3.5–5.2)
Alkaline Phosphatase: 73 U/L (ref 39–117)
Bilirubin, Direct: 0.1 mg/dL (ref 0.0–0.3)
Total Bilirubin: 0.4 mg/dL (ref 0.2–1.2)
Total Protein: 7.3 g/dL (ref 6.0–8.3)

## 2021-06-03 LAB — BASIC METABOLIC PANEL
BUN: 8 mg/dL (ref 6–23)
CO2: 29 mEq/L (ref 19–32)
Calcium: 9.1 mg/dL (ref 8.4–10.5)
Chloride: 105 mEq/L (ref 96–112)
Creatinine, Ser: 0.84 mg/dL (ref 0.40–1.50)
GFR: 83.94 mL/min (ref >60.00)
Glucose, Bld: 71 mg/dL (ref 70–99)
Potassium: 4.2 mEq/L (ref 3.5–5.1)
Sodium: 140 mEq/L (ref 135–145)

## 2021-06-03 MED ORDER — ATORVASTATIN CALCIUM 20 MG PO TABS: 20.0000 mg | ORAL_TABLET | Freq: Every day | ORAL | 3 refills | Status: DC

## 2021-06-03 MED ORDER — SERTRALINE HCL 25 MG PO TABS: 25.0000 mg | ORAL_TABLET | Freq: Every day | ORAL | 3 refills | Status: DC

## 2021-06-03 MED ORDER — MEMANTINE HCL 5 MG PO TABS: 5.0000 mg | ORAL_TABLET | Freq: Two times a day (BID) | ORAL | 3 refills | Status: DC

## 2021-06-03 MED ORDER — DONEPEZIL HCL 10 MG PO TABS: 10.0000 mg | ORAL_TABLET | Freq: Every day | ORAL | 3 refills | Status: DC

## 2021-06-03 MED ORDER — THERA-D 2000 50 MCG (2000 UT) PO TABS: ORAL_TABLET | ORAL | 99 refills | Status: DC

## 2021-06-03 NOTE — Patient Instructions (Addendum)
Ok to stop the HCT fluid pill, and the losartan for BP, as you BP seems ok today and you had the recent dizzy spell  Please take OTC Vitamin D3 at 2000 units per day, indefinitely  Please also take the OTC vitamin B12 1000 mcg per day  Please continue all other medications as before and your refills were sent  Please have the pharmacy call with any other refills you may need.  Please continue your efforts at being more active, low cholesterol diet  Please keep your appointments with your specialists as you may have planned  Please stop smoking  You will be contacted regarding the referral for: Echocardiogram, Carotid dopplers, and Heart Monitor testing  Please go to the LAB at the blood drawing area for the tests to be done  You will be contacted by phone if any changes need to be made immediately.  Otherwise, you will receive a letter about your results with an explanation, but please check with MyChart first.  Please remember to sign up for MyChart if you have not done so, as this will be important to you in the future with finding out test results, communicating by private email, and scheduling acute appointments online when needed.  Please make an Appointment to return in 1 months, or sooner if needed

## 2021-06-03 NOTE — Progress Notes (Signed)
Patient ID: Roberto Ros., male   DOB: [birth year], 80 y.o.   MRN: [id number]        Chief Complaint: follow up HTN, HLD and hyperglycemia, near syncope, low vit d and b12       HPI:  Roberto Wallace. is a 80 y.o. male here with recent near syncope,  Has been out of all meds for 2 wks. Starting to struggle taking care of himself, but says he is working on giving POA health to son, but he lives in [place].   Does say he can get to [facility] today to pick up his meds.  Not taking Vit D or B12 as well but willing to start.  Stll smoking, not ready to quit,   Pt denies chest pain, increased sob or doe, wheezing, orthopnea, PND, increased LE swelling, palpitations, dizziness or syncope.   Pt denies polydipsia, polyuria,  Per pt the [place] [facility] will schedule soon for left foot surgury, new dentures and colonoscopy.   Wt Readings from Last 3 Encounters:  06/03/21 114 lb (51.7 kg)  05/15/21 117 lb 1 oz (53.1 kg)  01/09/21 117 lb (53.1 kg)   BP Readings from Last 3 Encounters:  06/03/21 126/78  05/15/21 102/76  03/19/21 (!) 151/98         Past Medical History:  Diagnosis Date   Aneurysm (HCC)    per pt,in head   Arthritis    Chicken pox    Depression    Hypercholesteremia    Hypertension    Migraine    PTSD (post-traumatic stress disorder) 06/24/2017   Stroke (HCC)    No residual effects   Syphilis    Tuberculosis    Vitamin D deficiency    Past Surgical History:  Procedure Laterality Date   COLONOSCOPY     HERNIA REPAIR Right    x 2    reports that he has been smoking cigarettes. He has a 25.00 pack-year smoking history. He has never used smokeless tobacco. He reports current alcohol use of about 2.0 - 3.0 standard drinks per week. He reports that he does not use drugs. family history includes Dementia in his mother; Healthy in his father; Prostate cancer in his brother. Allergies  Allergen Reactions   Pollen Extract     Other reaction(s): Sinusitis   Amlodipine Rash    Current Outpatient Medications on File Prior to Visit  Medication Sig Dispense Refill   vitamin B-12 (CYANOCOBALAMIN) 100 MCG tablet Take 1 tablet (100 mcg total) by mouth daily. 90 tablet 3   aspirin 325 MG tablet Take 1 tablet (325 mg total) by mouth daily. (Patient not taking: No sig reported) 30 tablet 0   PEG-KCl-NaCl-NaSulf-Na Asc-C (PLENVU) 140 g SOLR Use as directed (Patient not taking: No sig reported) 1 each 0   No current facility-administered medications on file prior to visit.        ROS:  All others reviewed and negative.  Objective        PE:  BP 126/78 (BP Location: Left Arm, Patient Position: Sitting, Cuff Size: Normal)   Pulse 84   Ht 5\' 10"  (1.778 m)   Wt 114 lb (51.7 kg)   SpO2 100%   BMI 16.36 kg/m                 Constitutional: Pt appears in NAD               HENT: Head: NCAT.  Right Ear: External ear normal.                 Left Ear: External ear normal.                Eyes: . Pupils are equal, round, and reactive to light. Conjunctivae and EOM are normal               Nose: without d/c or deformity               Neck: Neck supple. Gross normal ROM               Cardiovascular: Normal rate and regular rhythm.                 Pulmonary/Chest: Effort normal and breath sounds without rales or wheezing.                Abd:  Soft, NT, ND, + BS, no organomegaly               Neurological: Pt is alert. At baseline orientation, motor grossly intact               Skin: Skin is warm. No rashes, no other new lesions, LE edema - none               Psychiatric: Pt behavior is normal without agitation   Micro: none  Cardiac tracings I have personally interpreted today:  none  Pertinent Radiological findings (summarize): none   Lab Results  Component Value Date   WBC 5.7 06/03/2021   HGB 13.3 06/03/2021   HCT 40.7 06/03/2021   PLT 214.0 06/03/2021   GLUCOSE 71 06/03/2021   CHOL 184 12/12/2020   TRIG 58.0 12/12/2020   HDL 75.10 12/12/2020    LDLCALC 97 12/12/2020   ALT 9 06/03/2021   AST 12 06/03/2021   NA 140 06/03/2021   K 4.2 06/03/2021   CL 105 06/03/2021   CREATININE 0.84 06/03/2021   BUN 8 06/03/2021   CO2 29 06/03/2021   TSH 1.26 12/12/2020   PSA 2.77 12/12/2020   INR 0.99 10/16/2017   HGBA1C 5.6 06/03/2021   Assessment/Plan:  Roberto Hatchet [name] is a 80 y.o. Black or African American [2] male with  has a past medical history of Aneurysm (HCC), Arthritis, Chicken pox, Depression, Hypercholesteremia, Hypertension, Migraine, PTSD (post-traumatic stress disorder) (06/24/2017), Stroke (HCC), Syphilis, Tuberculosis, and Vitamin D deficiency.  Near syncope By hx may have been vasovagal but cant r/o other such as cardiac - for echo, carotids, and cardiac event monitor, as well as f/u labs as ordered today, and also d/c hct and losartan due to lower BP recently  TOBACCO USER Counseled to quit, pt not ready  HYPERTENSION, BENIGN ESSENTIAL I suspect mild overcontrolled, for d/c hct and follow closely  Hyperlipidemia Lab Results  Component Value Date   LDLCALC 97 12/12/2020   uncontrolled, pt to continue restart lipitor with goal < 70   Hyperglycemia Lab Results  Component Value Date   HGBA1C 5.6 06/03/2021   Stable, pt to continue current medical treatment - diet,   Vitamin D deficiency Last vitamin D Lab Results  Component Value Date   VD25OH 21.47 (L) 12/12/2020   Low, to start oral replacement   B12 deficiency Lab Results  Component Value Date   VITAMINB12 152 (L) 12/12/2020   Low, to start oral replacement - b12 1000 mcg qd  Followup: Return in about 4 weeks (around 07/01/2021).  Roberto Barre, MD 06/06/2021 9:36 PM [facility]

## 2021-06-03 NOTE — Progress Notes (Signed)
Patient ID: Roberto Ros., male   DOB: 06/24/1941, 80 y.o.   MRN: [id number] Patient enrolled for [employer] to mail a 21 day cardiac event monitor to his address on file. Letter with instructions mailed to patient.

## 2021-06-03 NOTE — Assessment & Plan Note (Addendum)
By hx may have been vasovagal but cant r/o other such as cardiac - for echo, carotids, and cardiac event monitor, as well as f/u labs as ordered today, and also d/c hct and losartan due to lower BP recently

## 2021-06-06 ENCOUNTER — Other Ambulatory Visit: Payer: Self-pay

## 2021-06-06 ENCOUNTER — Ambulatory Visit (HOSPITAL_COMMUNITY)
Admission: RE | Admit: 2021-06-06 | Discharge: 2021-06-06 | Disposition: A | Discharge status: Home / Self Care | Payer: Medicare Other | Source: Ambulatory Visit | Attending: Internal Medicine | Admitting: Internal Medicine

## 2021-06-06 ENCOUNTER — Encounter: Payer: Self-pay | Admitting: Internal Medicine

## 2021-06-06 DIAGNOSIS — R55 Syncope and collapse: Secondary | ICD-10-CM | POA: Diagnosis not present

## 2021-06-06 NOTE — Assessment & Plan Note (Signed)
Lab Results  Component Value Date   VITAMINB12 152 (L) 12/12/2020   Low, to start oral replacement - b12 1000 mcg qd

## 2021-06-06 NOTE — Assessment & Plan Note (Signed)
Lab Results  Component Value Date   HGBA1C 5.6 06/03/2021   Stable, pt to continue current medical treatment - diet,

## 2021-06-06 NOTE — Assessment & Plan Note (Signed)
Counseled to quit, pt not ready 

## 2021-06-06 NOTE — Assessment & Plan Note (Signed)
I suspect mild overcontrolled, for d/c hct and follow closely

## 2021-06-06 NOTE — Assessment & Plan Note (Signed)
Lab Results  Component Value Date   LDLCALC 97 12/12/2020   uncontrolled, pt to continue restart lipitor with goal < 70

## 2021-06-06 NOTE — Assessment & Plan Note (Signed)
Last vitamin D Lab Results  Component Value Date   VD25OH 21.47 (L) 12/12/2020   Low, to start oral replacement

## 2021-06-07 ENCOUNTER — Encounter: Payer: Self-pay | Admitting: Internal Medicine

## 2021-06-12 ENCOUNTER — Ambulatory Visit (INDEPENDENT_AMBULATORY_CARE_PROVIDER_SITE_OTHER): Payer: Medicare Other

## 2021-06-12 ENCOUNTER — Other Ambulatory Visit: Payer: Self-pay

## 2021-06-12 DIAGNOSIS — R55 Syncope and collapse: Secondary | ICD-10-CM | POA: Diagnosis not present

## 2021-06-12 NOTE — Progress Notes (Unsigned)
[employer] monitor mailed 06/03/21 applied in office. [id number]

## 2021-06-19 ENCOUNTER — Other Ambulatory Visit: Payer: Self-pay

## 2021-06-19 ENCOUNTER — Ambulatory Visit: Payer: Medicare Other | Attending: Internal Medicine | Admitting: Audiologist

## 2021-06-19 DIAGNOSIS — H903 Sensorineural hearing loss, bilateral: Secondary | ICD-10-CM | POA: Diagnosis not present

## 2021-06-19 NOTE — Procedures (Signed)
  Outpatient Audiology and [facility] [address] [place], [place]  [address] [phone]  AUDIOLOGICAL  EVALUATION  NAME: Roberto Wallace     DOB:   04-15-1941      MRN: [id number]                                                                                     DATE: 06/19/2021     REFERENT: Corwin Levins, MD STATUS: Outpatient DIAGNOSIS: Mild Sensorineural Hearing Loss Bilateral      History: Roberto Wallace was seen for an audiological evaluation.  Roberto Wallace is receiving a hearing evaluation due to concerns for his hearing after he referred on a community hearing screening. He said there was a lot of noise going in during his hearing screening, he feels he hears ok but wants to make sure. No pain or pressure reported in either ear. No tinnitus present in either ear. 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  Medical history positive for TIA stroke and aneurysm which can be a risk factor for hearing loss. No other relevant case history reported.    Evaluation:  Otoscopy showed a clear view of the tympanic membranes, bilaterally Tympanometry results were consistent with normal middle ear function, bilaterally   Audiometric testing was completed using conventional audiometry with insert transducer. Speech Recognition Thresholds were consistent with pure tone averages. Word Recognition was good at 40dB SL, which is roughly conversation level. Recorded [facility] [employer] used. Pure tone thresholds show normal sloping after 2k Hz to mild sensorineural hearing loss in both ears. Test results are consistent with slight asymmetry with right ear worse 3k-6k Hz.   Results:  The test results were reviewed with Roberto Wallace. He has a mild high frequency hearing loss. He would not benefit from hearing aids at this time. Recommend annual hearing tests to monitor  hearing loss progression.    Recommendations: 1.   Annual audiometric testing recommended with Roberto Wallace to monitor hearing loss progression and slight asymmetry.  Roberto Wallace  Audiologist, Au.D., CCC-A 06/19/2021  11:04 AM  Cc: Corwin Levins, MD

## 2021-06-21 ENCOUNTER — Telehealth: Payer: Self-pay | Admitting: Physician Assistant

## 2021-06-21 DIAGNOSIS — I4729 Other ventricular tachycardia: Secondary | ICD-10-CM

## 2021-06-21 NOTE — Telephone Encounter (Signed)
Waiting on fax. Spoke to patient and he was sleeping during the time of VT.

## 2021-06-21 NOTE — Addendum Note (Signed)
Addended by: Corwin Levins on: 06/21/2021 05:13 PM   Modules accepted: Orders

## 2021-06-21 NOTE — Telephone Encounter (Signed)
Ok to let pt know that on his heart monitor, he had a short run of non sustained Ventricular Tachycardia.  This is a heart rhtym that can some times cause dizziness or passing out.  It is not clear that this is related to his recent symptoms, but just in case we should refer him to cardiology.  Hopefully he will hear soon.

## 2021-06-21 NOTE — Telephone Encounter (Signed)
Call received from [employer]:  15 beats of VT at 175 underlying sinus at 72 autodetected at 0321 central time. They did not reach out to the patient.   I was unableable to reach the patient

## 2021-06-21 NOTE — Telephone Encounter (Signed)
   Cardiac Monitor Alert  Date of alert:  06/21/2021   Patient Name: Roberto Wallace  DOB: 1941-04-22  MRN: [id number]   [facility] Cardiologist: None  [facility] EP:  None    Monitor Information: Cardiac Event Monitor [[employer]]  Reason:  syncope & Collapse Ordering provider:  [name] :1}  Alert Ventricular Tachycardia This is the 1st alert for this rhythm.   Next Cardiology Appointment   Date:  07/02/21  Provider:  [name] (PCP)  The patient was contacted today.  He is asymptomatic. Arrhythmia, symptoms and history reviewed with Dr. Tenny Craw (DOD).   Plan:  Continue to monitor   Other: Spoke to patient and he was sleeping during the time of VT.  Sampson Goon, RN  06/21/2021 11:04 AM

## 2021-06-25 ENCOUNTER — Telehealth: Payer: Self-pay | Admitting: Cardiology

## 2021-06-25 NOTE — Telephone Encounter (Signed)
Monitoring company call - pt had 11 beats of NSVT at rate of 230 - otherwise [name] I called the pt but no answer- left message if he felt bad to go to ER, he has appt with Dr. Lynnette Caffey on 07/03/21  - monitor was placed for syncope. I left message with pt to keep appt.

## 2021-06-26 ENCOUNTER — Telehealth: Payer: Self-pay

## 2021-06-26 NOTE — Telephone Encounter (Signed)
   Cardiac Monitor Alert  Date of alert:  06/26/2021   Patient Name: Roberto [facility]  DOB: [birth year]  MRN: [id number]   [facility] Cardiologist: None  [facility] EP:  None    Monitor Information: Cardiac Event Monitor [[employer]]  Reason:  syncope and collapse Ordering provider:  Dr [name]   Alert Ventricular Tachycardia This is the 2nd alert for this rhythm.   Next Cardiology Appointment   Date:  07/03/2021  0840am Provider:  Dr Mervyn Skeeters. Thukkani  Attempted to contact pt and left voicemail message to contact [facility] at [phone]. Arrhythmia, symptoms and history have been reviewed with [name], NP on 06/25/2021 at 1615pm who also attempted to contact pt and left voicemail message requesting pt go to ED if he felt poorly.  See phone note for complete details..   Plan:  Continue monitoring   Other: Dr Lynnette Caffey was also made aware by [name], NP.  [name], RN  06/26/2021 9:35 AM

## 2021-07-01 NOTE — Progress Notes (Deleted)
Cardiology Office Note:    Date:  07/01/2021   ID:  Roberto Hatchet [name], DOB [birth year], MRN [id number]  PCP:  Corwin Levins, MD  Cardiologist:  None  Electrophysiologist:  None   Referring MD: Corwin Levins, MD   Chief Complaint/Reason for Referral: ***  History of Present Illness:    Roberto Yo. is a 80 y.o. male with a history of ***  Past Medical History:  Diagnosis Date   Aneurysm (HCC)    per pt,in head   Arthritis    Chicken pox    Depression    Hypercholesteremia    Hypertension    Migraine    PTSD (post-traumatic stress disorder) 06/24/2017   Stroke (HCC)    No residual effects   Syphilis    Tuberculosis    Vitamin D deficiency     Past Surgical History:  Procedure Laterality Date   COLONOSCOPY     HERNIA REPAIR Right    x 2    Current Medications: No outpatient medications have been marked as taking for the 07/03/21 encounter (Appointment) with Roberto Pyo, MD.     Allergies:   Pollen extract and Amlodipine   Social History   Tobacco Use   Smoking status: Every Day    Packs/day: 0.50    Years: 50.00    Pack years: 25.00    Types: Cigarettes   Smokeless tobacco: Never  Vaping Use   Vaping Use: Never used  Substance Use Topics   Alcohol use: Yes    Alcohol/week: 2.0 - 3.0 standard drinks    Types: 2 - 3 Shots of liquor per week    Comment: social, 3 per day   Drug use: No     Family History: The patient's family history includes Dementia in his mother; Healthy in his father; Prostate cancer in his brother.  ROS:   Please see the history of present illness.    All other systems reviewed and are negative.  EKGs/Labs/Other Studies Reviewed:    The following studies were reviewed today:  EKG:  ***  Imaging studies that I have independently reviewed today: ***  Recent Labs: 12/12/2020: TSH 1.26 06/03/2021: ALT 9; BUN 8; Creatinine, Ser 0.84; Hemoglobin 13.3; Platelets 214.0; Potassium 4.2; Sodium 140  Recent  Lipid Panel    Component Value Date/Time   CHOL 184 12/12/2020 1426   TRIG 58.0 12/12/2020 1426   HDL 75.10 12/12/2020 1426   CHOLHDL 2 12/12/2020 1426   VLDL 11.6 12/12/2020 1426   LDLCALC 97 12/12/2020 1426    Physical Exam:    VS:  There were no vitals taken for this visit.    Wt Readings from Last 5 Encounters:  06/03/21 114 lb (51.7 kg)  05/15/21 117 lb 1 oz (53.1 kg)  01/09/21 117 lb (53.1 kg)  12/25/20 114 lb (51.7 kg)  12/12/20 114 lb (51.7 kg)    @ganlpe @  ASSESSMENT:    1. Nonrheumatic aortic valve stenosis    PLAN:    Nonrheumatic aortic valve stenosis  @gatimesig @  Shared Decision Making/Informed Consent:   {Are you ordering a CV Procedure (e.g. stress test, cath, DCCV, TEE, etc)?   Press F2        :   Medication Adjustments/Labs and Tests Ordered: Current medicines are reviewed at length with the patient today.  Concerns regarding medicines are outlined above.   No orders of the defined types were placed in this encounter.   No orders of the defined  types were placed in this encounter.   There are no Patient Instructions on file for this visit.

## 2021-07-02 ENCOUNTER — Encounter: Payer: Self-pay | Admitting: Internal Medicine

## 2021-07-02 ENCOUNTER — Ambulatory Visit (INDEPENDENT_AMBULATORY_CARE_PROVIDER_SITE_OTHER): Payer: Medicare Other | Admitting: Internal Medicine

## 2021-07-02 ENCOUNTER — Other Ambulatory Visit: Payer: Self-pay

## 2021-07-02 VITALS — BP 112/60 | HR 90 | Temp 97.7°F | Ht 70.0 in | Wt 114.0 lb

## 2021-07-02 DIAGNOSIS — E538 Deficiency of other specified B group vitamins: Secondary | ICD-10-CM

## 2021-07-02 DIAGNOSIS — F17200 Nicotine dependence, unspecified, uncomplicated: Secondary | ICD-10-CM

## 2021-07-02 DIAGNOSIS — E559 Vitamin D deficiency, unspecified: Secondary | ICD-10-CM

## 2021-07-02 DIAGNOSIS — R55 Syncope and collapse: Secondary | ICD-10-CM | POA: Diagnosis not present

## 2021-07-02 DIAGNOSIS — I10 Essential (primary) hypertension: Secondary | ICD-10-CM | POA: Diagnosis not present

## 2021-07-02 MED ORDER — ASPIRIN 81 MG PO TBEC: 81.0000 mg | DELAYED_RELEASE_TABLET | Freq: Every day | ORAL | 12 refills | Status: DC

## 2021-07-02 NOTE — Progress Notes (Signed)
Patient ID: Roberto Ros., male   DOB: July 07, 1941, 80 y.o.   MRN: [id number]

## 2021-07-02 NOTE — Progress Notes (Signed)
Cardiology Office Note:    Date:  07/03/2021   ID:  Roberto Hatchet [name], DOB 1941/05/05, MRN [id number]  PCP:  Corwin Levins, MD  Cardiologist:  None  Electrophysiologist:  None   Referring MD: Corwin Levins, MD   Chief Complaint/Reason for Referral: Presyncope/syncope  History of Present Illness:    PROBLEM LIST: 1.  Tobacco abuse 2.  Hypertension 3.  NSVT on monitor October 2022 4.  History of stroke 5.  Arthritis, requiring wheelchair   Roberto Wallace. is a 80 y.o. male with the indicated history here for recommendations regarding NSVT seen on a monitor.  The patient had been seen in the emergency department last month due to presyncope after using the restroom.  The patient received IV fluids and felt much better.  EKG demonstrates sinus rhythm with anterior infarct pattern.  His labs were relatively unremarkable.  An echocardiogram was ordered but not done.  He had carotid Dopplers that were relatively unremarkable.  He did have a monitor placed which demonstrated 11 beat run of nonsustained ventricular tachycardia.  He has not had any recurrent presyncope or syncope.  He is really stressed because he lives right now in a place that he does not feel is safe.  He denies any chest pain, palpitations, paroxysmal nocturnal dyspnea, orthopnea, signs or symptoms stroke, or bad bleeding episodes.  He is required no emergency room visits or hospitalizations.  He was seen by his primary care provider yesterday and his aspirin was reduced to 81 mg.  Past Medical History:  Diagnosis Date   Aneurysm (HCC)    per pt,in head   Arthritis    Chicken pox    Depression    Hypercholesteremia    Hypertension    Migraine    PTSD (post-traumatic stress disorder) 06/24/2017   Stroke (HCC)    No residual effects   Syphilis    Tuberculosis    Vitamin D deficiency     Past Surgical History:  Procedure Laterality Date   COLONOSCOPY     HERNIA REPAIR Right    x 2    Current  Medications: Current Meds  Medication Sig   aspirin 81 MG EC tablet Take 1 tablet (81 mg total) by mouth daily. Swallow whole.   atorvastatin (LIPITOR) 20 MG tablet Take 1 tablet (20 mg total) by mouth daily at 6 PM.   Cholecalciferol (THERA-D 2000) 50 MCG (2000 UT) TABS 1 tab by mouth once daily   donepezil (ARICEPT) 10 MG tablet Take 1 tablet (10 mg total) by mouth at bedtime.   memantine (NAMENDA) 5 MG tablet Take 1 tablet (5 mg total) by mouth 2 (two) times daily.   metoprolol succinate (TOPROL XL) 25 MG 24 hr tablet Take 0.5 tablets (12.5 mg total) by mouth at bedtime.   sertraline (ZOLOFT) 25 MG tablet Take 1 tablet (25 mg total) by mouth daily.   vitamin B-12 (CYANOCOBALAMIN) 100 MCG tablet Take 1 tablet (100 mcg total) by mouth daily.     Allergies:   Pollen extract and Amlodipine   Social History   Tobacco Use   Smoking status: Every Day    Packs/day: 0.50    Years: 50.00    Pack years: 25.00    Types: Cigarettes   Smokeless tobacco: Never  Vaping Use   Vaping Use: Never used  Substance Use Topics   Alcohol use: Yes    Alcohol/week: 2.0 - 3.0 standard drinks    Types: 2 - 3  Shots of liquor per week    Comment: social, 3 per day   Drug use: No     Family History: The patient's family history includes Dementia in his mother; Healthy in his father; Prostate cancer in his brother.  ROS:   Please see the history of present illness.    All other systems reviewed and are negative.  EKGs/Labs/Other Studies Reviewed:    The following studies were reviewed today:  EKG: EKG demonstrates normal sinus rhythm possible LVH.  Imaging studies that I have independently reviewed today: Echocardiogram ordered but not yet done.  Recent Labs: 12/12/2020: TSH 1.26 06/03/2021: ALT 9; BUN 8; Creatinine, Ser 0.84; Hemoglobin 13.3; Platelets 214.0; Potassium 4.2; Sodium 140  Recent Lipid Panel    Component Value Date/Time   CHOL 184 12/12/2020 1426   TRIG 58.0 12/12/2020 1426    HDL 75.10 12/12/2020 1426   CHOLHDL 2 12/12/2020 1426   VLDL 11.6 12/12/2020 1426   LDLCALC 97 12/12/2020 1426    Physical Exam:    VS:  BP 110/70   Pulse 79   Ht 5\' 10"  (1.778 m)   Wt 117 lb 12.8 oz (53.4 kg)   SpO2 95%   BMI 16.90 kg/m     Wt Readings from Last 5 Encounters:  07/03/21 117 lb 12.8 oz (53.4 kg)  07/02/21 114 lb (51.7 kg)  06/03/21 114 lb (51.7 kg)  05/15/21 117 lb 1 oz (53.1 kg)  01/09/21 117 lb (53.1 kg)    GENERAL:  No apparent distress, AOx3 HEENT:  No carotid bruits, +2 carotid impulses, no scleral icterus CAR: RRR no murmurs, gallops, rubs, or thrills RES:  Clear to auscultation bilaterally ABD:  Soft, nontender, nondistended, positive bowel sounds x 4 VASC:  +2 radial pulses, +2 carotid pulses, palpable pedal pulses NEURO:  CN 2-12 grossly intact; motor and sensory grossly intact PSYCH:  No active depression or anxiety  ASSESSMENT:    1. NSVT (nonsustained ventricular tachycardia)   2. Hyperlipidemia, unspecified hyperlipidemia type   3. HYPERTENSION, BENIGN ESSENTIAL    PLAN:    NSVT (nonsustained ventricular tachycardia) The patient had an episode of nonsustained ventricular tachycardia.  He has worn the monitor for 3 weeks and just returned it.  We will follow-up on these results.  I will start him on Toprol-XL 12.5 mg at bedtime.  I will obtain an echocardiogram.  I will see him back in 3 months or earlier if needed.  At that time we will consider a coronary CTA potentially.   Hyperlipidemia, unspecified hyperlipidemia type This is being followed by the patient's primary care provider.  HYPERTENSION, BENIGN ESSENTIAL Blood pressure is well controlled on his current regimen.  However we will be starting Toprol XL at bedtime due to NSVT.    Shared Decision Making/Informed Consent:       Medication Adjustments/Labs and Tests Ordered: Current medicines are reviewed at length with the patient today.  Concerns regarding medicines are  outlined above.   Orders Placed This Encounter  Procedures   EKG 12-Lead   ECHOCARDIOGRAM COMPLETE     Meds ordered this encounter  Medications   metoprolol succinate (TOPROL XL) 25 MG 24 hr tablet    Sig: Take 0.5 tablets (12.5 mg total) by mouth at bedtime.    Dispense:  45 tablet    Refill:  3     Patient Instructions  Medication Instructions:  Your physician has recommended you make the following change in your medication:  1.) start metoprolol succinate  25 mg - take HALF TABLET by mouth daily at bedtime  *If you need a refill on your cardiac medications before your next appointment, please call your pharmacy*   Lab Work: none  Testing/Procedures: Your physician has requested that you have an echocardiogram. Echocardiography is a painless test that uses sound waves to create images of your heart. It provides your doctor with information about the size and shape of your heart and how well your heart's chambers and valves are working. This procedure takes approximately one hour. There are no restrictions for this procedure.   Follow-Up: At [facility], you and your health needs are our priority.  As part of our continuing mission to provide you with exceptional heart care, we have created designated Provider Care Teams.  These Care Teams include your primary Cardiologist (physician) and Advanced Practice Providers (APPs -  Physician Assistants and Nurse Practitioners) who all work together to provide you with the care you need, when you need it.   Your next appointment:   3 month(s)  The format for your next appointment:   In Person  Provider:   Myles Lipps, MD    Other Instructions

## 2021-07-02 NOTE — Progress Notes (Signed)
Patient ID: Roberto Ros., male   DOB: 1940/09/03, 80 y.o.   MRN: [id number]        Chief Complaint: follow up recent near syncope, htn, low vit d, low b12, smoker       HPI:  Roberto Wallace. is a 80 y.o. male here overall doing ok, Pt denies chest pain, increased sob or doe, wheezing, orthopnea, PND, increased LE swelling, palpitations, dizziness or syncope.   Pt denies polydipsia, polyuria, or new focal neuro s/s.   Pt denies fever, night sweats, loss of appetite, or other constitutional symptoms, though has lost another 3 lbs with food insecurity.   Having the cologuard through [place] nov 14.  To have eyes checked soon, already had hearing testing recently with mild left hearing loss, no hearing aids needed.  May get new dentures soon as well, after that plans to get more than ensure to eat.   Not taking B12 and Vit D .  Still smoking and trying to wean down, but just not ready to quit.         Wt Readings from Last 3 Encounters:  07/03/21 117 lb 12.8 oz (53.4 kg)  07/02/21 114 lb (51.7 kg)  06/03/21 114 lb (51.7 kg)   BP Readings from Last 3 Encounters:  07/03/21 110/70  07/02/21 112/60  06/03/21 126/78         Past Medical History:  Diagnosis Date   Aneurysm (HCC)    per pt,in head   Arthritis    Chicken pox    Depression    Hypercholesteremia    Hypertension    Migraine    PTSD (post-traumatic stress disorder) 06/24/2017   Stroke (HCC)    No residual effects   Syphilis    Tuberculosis    Vitamin D deficiency    Past Surgical History:  Procedure Laterality Date   COLONOSCOPY     HERNIA REPAIR Right    x 2    reports that he has been smoking cigarettes. He has a 25.00 pack-year smoking history. He has never used smokeless tobacco. He reports current alcohol use of about 2.0 - 3.0 standard drinks per week. He reports that he does not use drugs. family history includes Dementia in his mother; Healthy in his father; Prostate cancer in his brother. Allergies   Allergen Reactions   Pollen Extract     Other reaction(s): Sinusitis   Amlodipine Rash   Current Outpatient Medications on File Prior to Visit  Medication Sig Dispense Refill   atorvastatin (LIPITOR) 20 MG tablet Take 1 tablet (20 mg total) by mouth daily at 6 PM. 90 tablet 3   Cholecalciferol (THERA-D 2000) 50 MCG (2000 UT) TABS 1 tab by mouth once daily 90 tablet 99   donepezil (ARICEPT) 10 MG tablet Take 1 tablet (10 mg total) by mouth at bedtime. 90 tablet 3   memantine (NAMENDA) 5 MG tablet Take 1 tablet (5 mg total) by mouth 2 (two) times daily. 180 tablet 3   sertraline (ZOLOFT) 25 MG tablet Take 1 tablet (25 mg total) by mouth daily. 90 tablet 3   vitamin B-12 (CYANOCOBALAMIN) 100 MCG tablet Take 1 tablet (100 mcg total) by mouth daily. 90 tablet 3   No current facility-administered medications on file prior to visit.        ROS:  All others reviewed and negative.  Objective        PE:  BP 112/60 (BP Location: Left Arm, Patient Position: Sitting, Cuff  Size: Normal)   Pulse 90   Temp 97.7 F (36.5 C) (Oral)   Ht 5\' 10"  (1.778 m)   Wt 114 lb (51.7 kg)   SpO2 98%   BMI 16.36 kg/m                 Constitutional: Pt appears in NAD               HENT: Head: NCAT.                Right Ear: External ear normal.                 Left Ear: External ear normal.                Eyes: . Pupils are equal, round, and reactive to light. Conjunctivae and EOM are normal               Nose: without d/c or deformity               Neck: Neck supple. Gross normal ROM               Cardiovascular: Normal rate and regular rhythm.                 Pulmonary/Chest: Effort normal and breath sounds without rales or wheezing.                Abd:  Soft, NT, ND, + BS, no organomegaly               Neurological: Pt is alert. At baseline orientation, motor grossly intact               Skin: Skin is warm. No rashes, no other new lesions, LE edema - none               Psychiatric: Pt behavior is normal  without agitation   Micro: none  Cardiac tracings I have personally interpreted today:  none  Pertinent Radiological findings (summarize): none   Lab Results  Component Value Date   WBC 5.7 06/03/2021   HGB 13.3 06/03/2021   HCT 40.7 06/03/2021   PLT 214.0 06/03/2021   GLUCOSE 71 06/03/2021   CHOL 184 12/12/2020   TRIG 58.0 12/12/2020   HDL 75.10 12/12/2020   LDLCALC 97 12/12/2020   ALT 9 06/03/2021   AST 12 06/03/2021   NA 140 06/03/2021   K 4.2 06/03/2021   CL 105 06/03/2021   CREATININE 0.84 06/03/2021   BUN 8 06/03/2021   CO2 29 06/03/2021   TSH 1.26 12/12/2020   PSA 2.77 12/12/2020   INR 0.99 10/16/2017   HGBA1C 5.6 06/03/2021   Assessment/Plan:  08/03/2021 [name] is a 80 y.o. Black or African American [2] male with  has a past medical history of Aneurysm (HCC), Arthritis, Chicken pox, Depression, Hypercholesteremia, Hypertension, Migraine, PTSD (post-traumatic stress disorder) (06/24/2017), Stroke (HCC), Syphilis, Tuberculosis, and Vitamin D deficiency.  Near syncope Overall doing well, echo and cardiac monitor pending,  to f/u any worsening symptoms or concerns  TOBACCO USER Pt counseled to quit, pt not ready  HYPERTENSION, BENIGN ESSENTIAL BP Readings from Last 3 Encounters:  07/03/21 110/70  07/02/21 112/60  06/03/21 126/78   Stable, pt to continue medical treatment  Without losartan, pt declines restart asa 325, but asked to start 81 qd   Vitamin D deficiency Last vitamin D Lab Results  Component Value Date   VD25OH 21.47 (L) 12/12/2020  Low, to start oral replacement   B12 deficiency Lab Results  Component Value Date   VITAMINB12 152 (L) 12/12/2020   Low, to start oral replacement - b12 1000 mcg qd  Followup: No follow-ups on file.  [name], MD 07/07/2021 8:28 PM [facility]

## 2021-07-02 NOTE — Patient Instructions (Signed)
Ok to stay off the losartan as you are doing  Please restart the aspirin at 81 mg per day (ecotrin)  Please take OTC Vitamin D3 at 2000 units per day, indefinitely, and the B12 as we mentioned  Please continue all other medications as before, and refills have been done if requested.  Please have the pharmacy call with any other refills you may need.  Please continue your efforts at being more active, low cholesterol diet, and weight control.  Please keep your appointments with your specialists as you may have planned  Please quit smoking  Please make an Appointment to return in 4 months, or sooner if needed

## 2021-07-03 ENCOUNTER — Encounter: Payer: Self-pay | Admitting: Internal Medicine

## 2021-07-03 ENCOUNTER — Ambulatory Visit (INDEPENDENT_AMBULATORY_CARE_PROVIDER_SITE_OTHER): Payer: Medicare Other | Admitting: Internal Medicine

## 2021-07-03 ENCOUNTER — Encounter: Payer: Self-pay | Admitting: *Deleted

## 2021-07-03 VITALS — BP 110/70 | HR 79 | Ht 70.0 in | Wt 117.8 lb

## 2021-07-03 DIAGNOSIS — I350 Nonrheumatic aortic (valve) stenosis: Secondary | ICD-10-CM

## 2021-07-03 DIAGNOSIS — I4729 Other ventricular tachycardia: Secondary | ICD-10-CM | POA: Diagnosis not present

## 2021-07-03 DIAGNOSIS — E785 Hyperlipidemia, unspecified: Secondary | ICD-10-CM | POA: Diagnosis not present

## 2021-07-03 DIAGNOSIS — I10 Essential (primary) hypertension: Secondary | ICD-10-CM

## 2021-07-03 MED ORDER — METOPROLOL SUCCINATE ER 25 MG PO TB24: 12.5000 mg | ORAL_TABLET | Freq: Every day | ORAL | 3 refills | Status: DC

## 2021-07-03 NOTE — Progress Notes (Signed)
Patient ID: Roberto Ros., male   DOB: 1941/03/18, 80 y.o.   MRN: [id number] Patient returned [employer] event monitor serial # [id number] to [facility] office. Monitor shipped to [employer] in box with prepaid return UPS shipping label.

## 2021-07-03 NOTE — Patient Instructions (Signed)
Medication Instructions:  Your physician has recommended you make the following change in your medication:  1.) start metoprolol succinate 25 mg - take HALF TABLET by mouth daily at bedtime  *If you need a refill on your cardiac medications before your next appointment, please call your pharmacy*   Lab Work: none  Testing/Procedures: Your physician has requested that you have an echocardiogram. Echocardiography is a painless test that uses sound waves to create images of your heart. It provides your doctor with information about the size and shape of your heart and how well your heart's chambers and valves are working. This procedure takes approximately one hour. There are no restrictions for this procedure.   Follow-Up: At [facility], you and your health needs are our priority.  As part of our continuing mission to provide you with exceptional heart care, we have created designated Provider Care Teams.  These Care Teams include your primary Cardiologist (physician) and Advanced Practice Providers (APPs -  Physician Assistants and Nurse Practitioners) who all work together to provide you with the care you need, when you need it.   Your next appointment:   3 month(s)  The format for your next appointment:   In Person  Provider:   Myles Lipps, MD    Other Instructions

## 2021-07-05 ENCOUNTER — Encounter: Payer: Self-pay | Admitting: Internal Medicine

## 2021-07-07 NOTE — Assessment & Plan Note (Signed)
Overall doing well, echo and cardiac monitor pending,  to f/u any worsening symptoms or concerns

## 2021-07-07 NOTE — Assessment & Plan Note (Signed)
BP Readings from Last 3 Encounters:  07/03/21 110/70  07/02/21 112/60  06/03/21 126/78   Stable, pt to continue medical treatment  Without losartan, pt declines restart asa 325, but asked to start 81 qd

## 2021-07-07 NOTE — Assessment & Plan Note (Signed)
Pt counseled to quit, pt not ready 

## 2021-07-07 NOTE — Assessment & Plan Note (Signed)
Last vitamin D Lab Results  Component Value Date   VD25OH 21.47 (L) 12/12/2020   Low, to start oral replacement

## 2021-07-07 NOTE — Assessment & Plan Note (Signed)
Lab Results  Component Value Date   VITAMINB12 152 (L) 12/12/2020   Low, to start oral replacement - b12 1000 mcg qd

## 2021-07-09 ENCOUNTER — Other Ambulatory Visit: Payer: Self-pay

## 2021-07-09 ENCOUNTER — Emergency Department (HOSPITAL_COMMUNITY)
Admission: EM | Admit: 2021-07-09 | Discharge: 2021-07-09 | Disposition: A | Discharge status: Home / Self Care | Payer: Medicare Other | Attending: Emergency Medicine | Admitting: Emergency Medicine

## 2021-07-09 ENCOUNTER — Emergency Department (HOSPITAL_COMMUNITY): Payer: Medicare Other

## 2021-07-09 DIAGNOSIS — F17210 Nicotine dependence, cigarettes, uncomplicated: Secondary | ICD-10-CM | POA: Diagnosis not present

## 2021-07-09 DIAGNOSIS — Z79899 Other long term (current) drug therapy: Secondary | ICD-10-CM | POA: Insufficient documentation

## 2021-07-09 DIAGNOSIS — I10 Essential (primary) hypertension: Secondary | ICD-10-CM | POA: Diagnosis not present

## 2021-07-09 DIAGNOSIS — F0390 Unspecified dementia without behavioral disturbance: Secondary | ICD-10-CM | POA: Insufficient documentation

## 2021-07-09 DIAGNOSIS — R42 Dizziness and giddiness: Secondary | ICD-10-CM

## 2021-07-09 DIAGNOSIS — I959 Hypotension, unspecified: Secondary | ICD-10-CM | POA: Diagnosis not present

## 2021-07-09 DIAGNOSIS — J449 Chronic obstructive pulmonary disease, unspecified: Secondary | ICD-10-CM | POA: Diagnosis not present

## 2021-07-09 DIAGNOSIS — Z7982 Long term (current) use of aspirin: Secondary | ICD-10-CM | POA: Insufficient documentation

## 2021-07-09 DIAGNOSIS — R0902 Hypoxemia: Secondary | ICD-10-CM | POA: Diagnosis not present

## 2021-07-09 DIAGNOSIS — R55 Syncope and collapse: Secondary | ICD-10-CM | POA: Diagnosis not present

## 2021-07-09 LAB — CBC WITH DIFFERENTIAL/PLATELET
Abs Immature Granulocytes: 0.03 10*3/uL (ref 0.00–0.07)
Basophils Absolute: 0 10*3/uL (ref 0.0–0.1)
Basophils Relative: 0 %
Eosinophils Absolute: 0.5 10*3/uL (ref 0.0–0.5)
Eosinophils Relative: 5 %
HCT: 44.5 % (ref 39.0–52.0)
Hemoglobin: 14.3 g/dL (ref 13.0–17.0)
Immature Granulocytes: 0 %
Lymphocytes Relative: 18 %
Lymphs Abs: 1.9 10*3/uL (ref 0.7–4.0)
MCH: 29.6 pg (ref 26.0–34.0)
MCHC: 32.1 g/dL (ref 30.0–36.0)
MCV: 92.1 fL (ref 80.0–100.0)
Monocytes Absolute: 0.6 10*3/uL (ref 0.1–1.0)
Monocytes Relative: 6 %
Neutro Abs: 7.1 10*3/uL (ref 1.7–7.7)
Neutrophils Relative %: 71 %
Platelets: 234 10*3/uL (ref 150–400)
RBC: 4.83 MIL/uL (ref 4.22–5.81)
RDW: 12.7 % (ref 11.5–15.5)
WBC: 10 10*3/uL (ref 4.0–10.5)
nRBC: 0 % (ref 0.0–0.2)

## 2021-07-09 LAB — COMPREHENSIVE METABOLIC PANEL
ALT: 17 U/L (ref 0–44)
AST: 18 U/L (ref 15–41)
Albumin: 3.8 g/dL (ref 3.5–5.0)
Alkaline Phosphatase: 69 U/L (ref 38–126)
Anion gap: 10 (ref 5–15)
BUN: 12 mg/dL (ref 8–23)
CO2: 29 mmol/L (ref 22–32)
Calcium: 9.1 mg/dL (ref 8.9–10.3)
Chloride: 97 mmol/L — ABNORMAL LOW (ref 98–111)
Creatinine, Ser: 0.94 mg/dL (ref 0.61–1.24)
GFR, Estimated: >60 mL/min (ref >60)
Glucose, Bld: 122 mg/dL — ABNORMAL HIGH (ref 70–99)
Potassium: 3.8 mmol/L (ref 3.5–5.1)
Sodium: 136 mmol/L (ref 135–145)
Total Bilirubin: 1 mg/dL (ref 0.3–1.2)
Total Protein: 7.3 g/dL (ref 6.5–8.1)

## 2021-07-09 LAB — TROPONIN I (HIGH SENSITIVITY)
Troponin I (High Sensitivity): 3 ng/L (ref <18)
Troponin I (High Sensitivity): 3 ng/L (ref <18)

## 2021-07-09 LAB — MAGNESIUM: Magnesium: 1.7 mg/dL (ref 1.7–2.4)

## 2021-07-09 MED ORDER — SODIUM CHLORIDE 0.9 % IV BOLUS
1000.0000 mL | Freq: Once | INTRAVENOUS | Status: AC
  Administered 2021-07-09: 1000 mL via INTRAVENOUS

## 2021-07-09 MED ORDER — LACTATED RINGERS IV BOLUS
1000.0000 mL | Freq: Once | INTRAVENOUS | Status: AC
  Administered 2021-07-09: 1000 mL via INTRAVENOUS

## 2021-07-09 NOTE — ED Triage Notes (Signed)
Pt arrives via EMS from [facility] for acute onset dizziness this am. EMS reports initial VS were "good." They reports that pt became hypotension a few minutes prior to arrival. Pt denies worsening symptom

## 2021-07-09 NOTE — ED Notes (Signed)
Got patient undressed on the monitor did ekg shown to er provider got patient into a gown got patient a warm blanket

## 2021-07-09 NOTE — Discharge Instructions (Addendum)
Do not take your blood pressure medicines tomorrow.  If you develop any recurrent dizziness or lightheadedness or feel like you are going to pass out then call 911 or return to the ER.  Otherwise call your primary care doctor to see if any medication adjustments are needed.

## 2021-07-09 NOTE — ED Notes (Signed)
Pt ambulatory to the bathroom with walker and back to room. Pt denies any complaints. Waiting for [facility] transport. Will continue to monitor. No acute changes noted.

## 2021-07-09 NOTE — ED Provider Notes (Signed)
[facility] EMERGENCY DEPARTMENT Provider Note   CSN: [id number] Arrival date & time: 07/09/21  1149     History Chief Complaint  Patient presents with   Dizziness    Roberto Demo. is a 80 y.o. male.  Presents to ER after having episode of dizziness/lightheadedness.  Patient reports that yesterday he was in his normal state of health, no issues, then this morning he woke up and felt fine however later in the morning he felt lightheaded, generally weak and dizzy.  No room spinning sensation.  Did not pass out.  Had some associated sweating this with the episode.  No chest pain or difficulty breathing.  States that this episode completely resolved and he has no ongoing symptoms at present.  States that he has back to his normal self.  Per review of chart patient had episode of near syncope in September.  Follow-up with primary doctor and outpatient cardiology, had outpatient monitor which was grossly negative.  HPI     Past Medical History:  Diagnosis Date   Aneurysm ([place])    per pt,in head   Arthritis    Chicken pox    Depression    Hypercholesteremia    Hypertension    Migraine    PTSD (post-traumatic stress disorder) 06/24/2017   Stroke ([place])    No residual effects   Syphilis    Tuberculosis    Vitamin D deficiency     Patient Active Problem List   Diagnosis Date Noted   Near syncope 06/03/2021   Pedal edema 01/01/2021   Nail disorder 01/01/2021   Homelessness 12/12/2020   Microhematuria 02/01/2020   Allergic rhinitis 02/01/2020   History of colonic polyps 02/01/2020   Vitamin D deficiency 03/12/2019   B12 deficiency 03/12/2019   Hyperglycemia 03/10/2019   Cough 03/10/2019   History of exposure to asbestos 03/10/2019   Hyperlipidemia 10/16/2017   Depression 10/16/2017   Stroke-like symptoms 10/16/2017   Mild dementia ([place]) 10/16/2017   Lateral epicondylitis, right elbow 06/26/2017   PTSD (post-traumatic stress disorder) 06/24/2017    Right wrist pain 06/09/2016   Osteoarthritis 02/05/2016   Tuberculosis 02/05/2016   Loss of weight 10/31/2015   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   Neuropathy 10/03/2015   Intermittent memory loss 10/03/2015   Decreased mobility 10/03/2015   Encounter for well adult exam with abnormal findings 04/16/2015   Medicare annual wellness visit, subsequent 04/16/2015   TOBACCO USER 06/14/2010   Anxiety and depression 11/19/2009   HYPERTENSION, BENIGN ESSENTIAL 11/19/2009   Other specified cardiac dysrhythmias(427.89) 11/19/2009   GEN OSTEOARTHROSIS INVOLVING MULTIPLE SITES 11/19/2009   History of cardiovascular disorder 11/19/2009    Past Surgical History:  Procedure Laterality Date   COLONOSCOPY     HERNIA REPAIR Right    x 2       Family History  Problem Relation Age of Onset   Dementia Mother    Healthy Father    Prostate cancer Brother     Social History   Tobacco Use   Smoking status: Every Day    Packs/day: 0.50    Years: 50.00    Pack years: 25.00    Types: Cigarettes   Smokeless tobacco: Never  Vaping Use   Vaping Use: Never used  Substance Use Topics   Alcohol use: Yes    Alcohol/week: 2.0 - 3.0 standard drinks    Types: 2 - 3 Shots of liquor per week    Comment: social, 3  per day   Drug use: No    Home Medications Prior to Admission medications   Medication Sig Start Date End Date Taking? Authorizing Provider  aspirin 81 MG EC tablet Take 1 tablet (81 mg total) by mouth daily. Swallow whole. 07/02/21   Corwin Levins, MD  atorvastatin (LIPITOR) 20 MG tablet Take 1 tablet (20 mg total) by mouth daily at 6 PM. 06/03/21   Corwin Levins, MD  Cholecalciferol (THERA-D 2000) 50 MCG (2000 UT) TABS 1 tab by mouth once daily 06/03/21   Corwin Levins, MD  donepezil (ARICEPT) 10 MG tablet Take 1 tablet (10 mg total) by mouth at bedtime. 06/03/21   Corwin Levins, MD  memantine (NAMENDA) 5 MG tablet Take 1 tablet (5 mg total) by  mouth 2 (two) times daily. 06/03/21   Corwin Levins, MD  metoprolol succinate (TOPROL XL) 25 MG 24 hr tablet Take 0.5 tablets (12.5 mg total) by mouth at bedtime. 07/03/21   [name], MD  sertraline (ZOLOFT) 25 MG tablet Take 1 tablet (25 mg total) by mouth daily. 06/03/21   Corwin Levins, MD  vitamin B-12 (CYANOCOBALAMIN) 100 MCG tablet Take 1 tablet (100 mcg total) by mouth daily. 12/13/20   Corwin Levins, MD    Allergies    Pollen extract and Amlodipine  Review of Systems   Review of Systems  Constitutional:  Negative for chills and fever.  HENT:  Negative for ear pain and sore throat.   Eyes:  Negative for pain and visual disturbance.  Respiratory:  Negative for cough and shortness of breath.   Cardiovascular:  Negative for chest pain and palpitations.  Gastrointestinal:  Negative for abdominal pain and vomiting.  Genitourinary:  Negative for dysuria and hematuria.  Musculoskeletal:  Negative for arthralgias and back pain.  Skin:  Negative for color change and rash.  Neurological:  Positive for dizziness and light-headedness. Negative for seizures and syncope.  All other systems reviewed and are negative.  Physical Exam Updated Vital Signs BP 136/88   Pulse 73   Temp 97.6 F (36.4 C) (Oral)   Resp 16   Ht 5\' 10"  (1.778 m)   Wt 53 kg   SpO2 100%   BMI 16.77 kg/m   Physical Exam Vitals and nursing note reviewed.  Constitutional:      Appearance: He is well-developed.  HENT:     Head: Normocephalic and atraumatic.  Eyes:     Conjunctiva/sclera: Conjunctivae normal.  Cardiovascular:     Rate and Rhythm: Normal rate and regular rhythm.     Heart sounds: No murmur heard. Pulmonary:     Effort: Pulmonary effort is normal. No respiratory distress.     Breath sounds: Normal breath sounds.  Abdominal:     Palpations: Abdomen is soft.     Tenderness: There is no abdominal tenderness.  Musculoskeletal:     Cervical back: Neck supple.  Skin:    General: Skin is  warm and dry.  Neurological:     General: No focal deficit present.     Mental Status: He is alert.     Comments: AAOx3 CN 2-12 intact, speech clear visual fields intact 5/5 strength in b/l UE and LE Sensation to light touch intact in b/l UE and LE Normal FNF    ED Results / Procedures / Treatments   Labs (all labs ordered are listed, but only abnormal results are displayed) Labs Reviewed  COMPREHENSIVE METABOLIC PANEL - Abnormal; Notable for  the following components:      Result Value   Chloride 97 (*)    Glucose, Bld 122 (*)    All other components within normal limits  CBC WITH DIFFERENTIAL/PLATELET  MAGNESIUM  TROPONIN I (HIGH SENSITIVITY)  TROPONIN I (HIGH SENSITIVITY)    EKG EKG Interpretation  Date/Time:  Tuesday July 09 2021 11:55:54 EST Ventricular Rate:  63 PR Interval:  169 QRS Duration: 110 QT Interval:  451 QTC Calculation: 462 R Axis:   90 Text Interpretation: Sinus rhythm Borderline right axis deviation  overall similar to Sept 2022 Confirmed by Sherwood Gambler [phone]) on 07/09/2021 3:30:43 PM  Radiology DG Chest 2 View  Result Date: 07/09/2021 CLINICAL DATA:  Syncope, COPD EXAM: CHEST - 2 VIEW COMPARISON:  05/15/2021 FINDINGS: Stable cardiomediastinal contours. Atherosclerotic calcification of the aortic knob. Hyperexpanded lung fields. No focal airspace consolidation. No pleural effusion or pneumothorax. Multiple chronic left-sided rib fractures. IMPRESSION: 1. No acute cardiopulmonary disease. 2. COPD. Electronically Signed   By: Davina Poke D.O.   On: 07/09/2021 13:33    Procedures Procedures   Medications Ordered in ED Medications  sodium chloride 0.9 % bolus 1,000 mL (0 mLs Intravenous Stopped 07/09/21 1621)  lactated ringers bolus 1,000 mL (0 mLs Intravenous Stopped 07/09/21 1900)    ED Course  I have reviewed the triage vital signs and the nursing notes.  Pertinent labs & imaging results that were available during my care of the  patient were reviewed by me and considered in my medical decision making (see chart for details).    MDM Rules/Calculators/A&P                           80 year old gentleman presented to ER after having episode of dizziness/lightheadedness.  On arrival here patient appears well in no distress.  Mildly soft BP but vitals otherwise within normal limits.  Obtained broad work-up, check basic labs, EKG and troponin and chest x-ray.  Provided liter of fluids.  Initial labs and troponin stable.  Patient remains asymptomatic and well-appearing.  We will plan to check second troponin but if negative, anticipate likely discharge home.  While awaiting second troponin and reassessment, signed out to Dr. Regenia Skeeter.  Final Clinical Impression(s) / ED Diagnoses Final diagnoses:  Lightheadedness  Hypotension, unspecified hypotension type    Rx / DC Orders ED Discharge Orders     None        Lucrezia Starch, MD 07/09/21 2218

## 2021-07-09 NOTE — ED Notes (Signed)
14th on ptar list

## 2021-07-09 NOTE — ED Notes (Signed)
Removed from ptar list 

## 2021-07-09 NOTE — ED Provider Notes (Signed)
4:53 PM Patient feels much better. However, he still has soft BPs. Tells me he's had some diarrhea this morning. No vomiting.  Maybe not as much to eat and drink.  We will give another bag of fluids as his current blood pressure is 98 systolic and he typically runs higher than that.  He doesn't normally walk but is in a wheelchair.   7:09 PM Patient continues to do well. 2nd bag is done. Multiple BPs on monitor have been ~110 systolic. Given he's asymptomatic he appears stable for d/c to follow up with PCP.  Labs are benign.    Pricilla Loveless, MD 07/09/21 1910

## 2021-07-12 ENCOUNTER — Encounter: Payer: Self-pay | Admitting: Internal Medicine

## 2021-07-12 ENCOUNTER — Other Ambulatory Visit: Payer: Self-pay

## 2021-07-12 ENCOUNTER — Ambulatory Visit (INDEPENDENT_AMBULATORY_CARE_PROVIDER_SITE_OTHER): Payer: Medicare Other | Admitting: Internal Medicine

## 2021-07-12 DIAGNOSIS — I10 Essential (primary) hypertension: Secondary | ICD-10-CM | POA: Diagnosis not present

## 2021-07-12 DIAGNOSIS — I4729 Other ventricular tachycardia: Secondary | ICD-10-CM

## 2021-07-12 DIAGNOSIS — N400 Enlarged prostate without lower urinary tract symptoms: Secondary | ICD-10-CM | POA: Insufficient documentation

## 2021-07-12 DIAGNOSIS — F17200 Nicotine dependence, unspecified, uncomplicated: Secondary | ICD-10-CM | POA: Diagnosis not present

## 2021-07-12 DIAGNOSIS — F03A0 Unspecified dementia, mild, without behavioral disturbance, psychotic disturbance, mood disturbance, and anxiety: Secondary | ICD-10-CM

## 2021-07-12 DIAGNOSIS — N401 Enlarged prostate with lower urinary tract symptoms: Secondary | ICD-10-CM

## 2021-07-12 MED ORDER — TADALAFIL 5 MG PO TABS: 5.0000 mg | ORAL_TABLET | Freq: Every day | ORAL | 11 refills | Status: DC

## 2021-07-12 NOTE — Assessment & Plan Note (Signed)
Pt encouraged to restart med tx,  to f/u any worsening symptoms or concerns

## 2021-07-12 NOTE — Assessment & Plan Note (Signed)
Pt counseled to quit, pt not ready 

## 2021-07-12 NOTE — Progress Notes (Signed)
Patient ID: Roberto Ros., male   DOB: April 20, 1941, 80 y.o.   MRN: [id number]        Chief Complaint: follow up recent ED visit with dizziness - seoond recent visit       HPI:  Roberto Wallace. is a 80 y.o. male here after ED visit Nov 15 with dizziness weakness and lower normal BP requiring IVFs to feel improved.  Since then Denies worsening reflux, abd pain, dysphagia, n/v, bowel change or blood.  Pt denies chest pain, increased sob or doe, wheezing, orthopnea, PND, increased LE swelling, palpitations, dizziness or syncope.   Pt denies polydipsia, polyuria, or new neuro s/s.  I note pt was started on metoprolol ER 25 mg - 1/2 per day on Nov 9, the day after I last saw him here and losartan stopped.  Pt normally with good med compliance despite dementia but stopped all of his meds since nov 15 as he just wasn't sure if any were making the problem worse.     Pt just not willing to restart the BB today after discussion.  Has ongoing food and nutrition insecurity as well.  Still smoking, wt overall recently relatively stable. Dementia overall stable symptomatically, and not assoc with behavioral changes such as hallucinations, paranoia, or agitation  Denies urinary symptoms such as dysuria, frequency, urgency, flank pain, hematuria or n/v, fever, chills.  But does have ongoing issue with difficulty getting started with urination       Wt Readings from Last 3 Encounters:  07/12/21 119 lb (54 kg)  07/09/21 116 lb 13.5 oz (53 kg)  07/03/21 117 lb 12.8 oz (53.4 kg)   BP Readings from Last 3 Encounters:  07/12/21 112/60  07/09/21 (!) 127/106  07/03/21 110/70         Past Medical History:  Diagnosis Date   Aneurysm ([facility])    per pt,in head   Arthritis    Chicken pox    Depression    Hypercholesteremia    Hypertension    Migraine    PTSD (post-traumatic stress disorder) 06/24/2017   Stroke ([facility])    No residual effects   Syphilis    Tuberculosis    Vitamin D deficiency    Past  Surgical History:  Procedure Laterality Date   COLONOSCOPY     HERNIA REPAIR Right    x 2    reports that he has been smoking cigarettes. He has a 25.00 pack-year smoking history. He has never used smokeless tobacco. He reports current alcohol use of about 2.0 - 3.0 standard drinks per week. He reports that he does not use drugs. family history includes Dementia in his mother; Healthy in his father; Prostate cancer in his brother. Allergies  Allergen Reactions   Pollen Extract     Other reaction(s): Sinusitis   Amlodipine Rash   Current Outpatient Medications on File Prior to Visit  Medication Sig Dispense Refill   aspirin 81 MG EC tablet Take 1 tablet (81 mg total) by mouth daily. Swallow whole. 30 tablet 12   atorvastatin (LIPITOR) 20 MG tablet Take 1 tablet (20 mg total) by mouth daily at 6 PM. 90 tablet 3   Cholecalciferol (THERA-D 2000) 50 MCG (2000 UT) TABS 1 tab by mouth once daily 90 tablet 99   donepezil (ARICEPT) 10 MG tablet Take 1 tablet (10 mg total) by mouth at bedtime. 90 tablet 3   memantine (NAMENDA) 5 MG tablet Take 1 tablet (5 mg total) by mouth 2 (  two) times daily. 180 tablet 3   metoprolol succinate (TOPROL XL) 25 MG 24 hr tablet Take 0.5 tablets (12.5 mg total) by mouth at bedtime. 45 tablet 3   sertraline (ZOLOFT) 25 MG tablet Take 1 tablet (25 mg total) by mouth daily. 90 tablet 3   vitamin B-12 (CYANOCOBALAMIN) 100 MCG tablet Take 1 tablet (100 mcg total) by mouth daily. 90 tablet 3   No current facility-administered medications on file prior to visit.        ROS:  All others reviewed and negative.  Objective        PE:  BP 112/60   Pulse 85   Temp 98 F (36.7 C) (Oral)   Ht 5\' 10"  (1.778 m)   Wt 119 lb (54 kg)   SpO2 98%   BMI 17.07 kg/m                 Constitutional: Pt appears in NAD               HENT: Head: NCAT.                Right Ear: External ear normal.                 Left Ear: External ear normal.                Eyes: . Pupils are  equal, round, and reactive to light. Conjunctivae and EOM are normal               Nose: without d/c or deformity               Neck: Neck supple. Gross normal ROM               Cardiovascular: Normal rate and regular rhythm.                 Pulmonary/Chest: Effort normal and breath sounds without rales or wheezing.                Abd:  Soft, NT, ND, + BS, no organomegaly               Neurological: Pt is alert. At baseline orientation, motor grossly intact               Skin: Skin is warm. No rashes, no other new lesions, LE edema - none               Psychiatric: Pt behavior is normal without agitation   Micro: none  Cardiac tracings I have personally interpreted today:  none  Pertinent Radiological findings (summarize): none   Lab Results  Component Value Date   WBC 10.0 07/09/2021   HGB 14.3 07/09/2021   HCT 44.5 07/09/2021   PLT 234 07/09/2021   GLUCOSE 122 (H) 07/09/2021   CHOL 184 12/12/2020   TRIG 58.0 12/12/2020   HDL 75.10 12/12/2020   LDLCALC 97 12/12/2020   ALT 17 07/09/2021   AST 18 07/09/2021   NA 136 07/09/2021   K 3.8 07/09/2021   CL 97 (L) 07/09/2021   CREATININE 0.94 07/09/2021   BUN 12 07/09/2021   CO2 29 07/09/2021   TSH 1.26 12/12/2020   PSA 2.77 12/12/2020   INR 0.99 10/16/2017   HGBA1C 5.6 06/03/2021   Assessment/Plan:  Roberto Gourd [name] is a 80 y.o. Black or African American [2] male with  has a past medical history of Aneurysm ([place]), Arthritis, Chicken pox,  Depression, Hypercholesteremia, Hypertension, Migraine, PTSD (post-traumatic stress disorder) (06/24/2017), Stroke ([place]), Syphilis, Tuberculosis, and Vitamin D deficiency.  BPH (benign prostatic hyperplasia) Mild persistent possibly worsening LUTS - for cialis 5 mg dialy to avoid alpha blocker given recent symptoms,  to f/u any worsening symptoms or concerns   HYPERTENSION, BENIGN ESSENTIAL Low normal recently, improved today,  to f/u any worsening symptoms or concerns  TOBACCO  USER Pt counseled to quit, pt not ready  NSVT (nonsustained ventricular tachycardia) Pt just not willing to restart even the low dose BB for now, has cardiology f/u feb 2023  Mild dementia ([place]) Pt encouraged to restart med tx,  to f/u any worsening symptoms or concerns  Followup: Return in about 4 months (around 10/30/2021).  Cathlean Cower, MD 07/12/2021 10:03 AM [facility]

## 2021-07-12 NOTE — Assessment & Plan Note (Signed)
Low normal recently, improved today,  to f/u any worsening symptoms or concerns

## 2021-07-12 NOTE — Patient Instructions (Addendum)
Ok to stay off the metoprolol as you mentioned  Please take all new medication as prescribed - the cialis for prostate  Please restart all of your other medications  Please continue all other medications as before, and refills have been done if requested.  Please have the pharmacy call with any other refills you may need.  Please continue your efforts at being more active, low cholesterol diet  Please keep your appointments with your specialists as you may have planned

## 2021-07-12 NOTE — Assessment & Plan Note (Signed)
Pt just not willing to restart even the low dose BB for now, has cardiology f/u feb 2023

## 2021-07-12 NOTE — Assessment & Plan Note (Signed)
Mild persistent possibly worsening LUTS - for cialis 5 mg dialy to avoid alpha blocker given recent symptoms,  to f/u any worsening symptoms or concerns

## 2021-07-24 ENCOUNTER — Ambulatory Visit (HOSPITAL_COMMUNITY): Payer: Medicare Other | Attending: Cardiology

## 2021-07-24 ENCOUNTER — Other Ambulatory Visit: Payer: Self-pay

## 2021-07-24 DIAGNOSIS — E785 Hyperlipidemia, unspecified: Secondary | ICD-10-CM | POA: Diagnosis not present

## 2021-07-24 DIAGNOSIS — I10 Essential (primary) hypertension: Secondary | ICD-10-CM | POA: Diagnosis not present

## 2021-07-24 DIAGNOSIS — I4729 Other ventricular tachycardia: Secondary | ICD-10-CM | POA: Diagnosis not present

## 2021-07-24 LAB — ECHOCARDIOGRAM COMPLETE
Area-P 1/2: 2.5 cm2
Calc EF: 44.8 %
S' Lateral: 2.8 cm
Single Plane A2C EF: 49.4 %
Single Plane A4C EF: 45.1 %

## 2021-07-30 ENCOUNTER — Telehealth: Payer: Self-pay | Admitting: *Deleted

## 2021-07-30 DIAGNOSIS — Z01812 Encounter for preprocedural laboratory examination: Secondary | ICD-10-CM

## 2021-07-30 NOTE — Telephone Encounter (Signed)
Reviewed with patient.  First availability for TEE is [birth year]21 which is patient's birthday and he cannot come that day.  Scheduled him with Dr. Anne Fu ([facility]) on 08/15/21 at 11:00 am. (# [phone])  He will arrive at 9:30 am and have labs drawn prior.  Pt is wheelchair dependent and will arrange medical transportation through [facility] to be picked up and taken to [facility] as well as taking him home.    He is aware to be NPO after MN except water and that he can take usual medications in the AM or wait until he gets back home.  He will arrive at 9:30 am and have labs drawn same day.  Orders placed for BMET, CBC.

## 2021-07-30 NOTE — Telephone Encounter (Signed)
-----   Message from [name], MD sent at 07/24/2021 11:19 AM EST ----- Please set him up for TEE re: evaluate for ASD

## 2021-08-05 ENCOUNTER — Encounter (HOSPITAL_COMMUNITY): Payer: Self-pay | Admitting: Cardiology

## 2021-08-06 NOTE — Progress Notes (Signed)
Attempted to obtain medical history via telephone, unable to reach at this time. I left a voicemail to return pre surgical testing department's phone call.  

## 2021-08-14 ENCOUNTER — Ambulatory Visit (INDEPENDENT_AMBULATORY_CARE_PROVIDER_SITE_OTHER): Payer: Medicare Other | Admitting: Physician Assistant

## 2021-08-14 ENCOUNTER — Other Ambulatory Visit: Payer: Self-pay

## 2021-08-14 ENCOUNTER — Encounter: Payer: Self-pay | Admitting: Physician Assistant

## 2021-08-14 VITALS — BP 110/70 | HR 77 | Ht 70.0 in | Wt 123.0 lb

## 2021-08-14 DIAGNOSIS — I10 Essential (primary) hypertension: Secondary | ICD-10-CM

## 2021-08-14 DIAGNOSIS — R931 Abnormal findings on diagnostic imaging of heart and coronary circulation: Secondary | ICD-10-CM

## 2021-08-14 DIAGNOSIS — M79604 Pain in right leg: Secondary | ICD-10-CM

## 2021-08-14 DIAGNOSIS — Z720 Tobacco use: Secondary | ICD-10-CM | POA: Diagnosis not present

## 2021-08-14 DIAGNOSIS — M79605 Pain in left leg: Secondary | ICD-10-CM

## 2021-08-14 DIAGNOSIS — I4729 Other ventricular tachycardia: Secondary | ICD-10-CM

## 2021-08-14 LAB — BASIC METABOLIC PANEL
BUN/Creatinine Ratio: 18 (ref 10–24)
BUN: 14 mg/dL (ref 8–27)
CO2: 29 mmol/L (ref 20–29)
Calcium: 9.3 mg/dL (ref 8.6–10.2)
Chloride: 101 mmol/L (ref 96–106)
Creatinine, Ser: 0.79 mg/dL (ref 0.76–1.27)
Glucose: 90 mg/dL (ref 70–99)
Potassium: 4.7 mmol/L (ref 3.5–5.2)
Sodium: 140 mmol/L (ref 134–144)
eGFR: 91 mL/min/{1.73_m2} (ref >59)

## 2021-08-14 LAB — CBC
Hematocrit: 40.5 % (ref 37.5–51.0)
Hemoglobin: 13.7 g/dL (ref 13.0–17.7)
MCH: 29 pg (ref 26.6–33.0)
MCHC: 33.8 g/dL (ref 31.5–35.7)
MCV: 86 fL (ref 79–97)
Platelets: 231 10*3/uL (ref 150–450)
RBC: 4.73 x10E6/uL (ref 4.14–5.80)
RDW: 13 % (ref 11.6–15.4)
WBC: 6.5 10*3/uL (ref 3.4–10.8)

## 2021-08-14 NOTE — Progress Notes (Signed)
HEART AND [place]                                     Cardiology Office Note:    Date:  08/14/2021   ID:  Roberto Gourd [name], DOB May 29, 1941, MRN [id number]  PCP:  [name], MD  [facility] Cardiologist:  Early Osmond, MD  [facility] Electrophysiologist:  None   Referring MD: [name], MD   Follow up- set up TEE   History of Present Illness:    Roberto Handfield. is a 80 y.o. male with a hx of tobacco abuse, HTN, NSVT, CVA, arthritis requiring a wheelchair and recent echo which showed atrial level shunting with RV dilation who presents to clinic to discuss setting up a TEE.   He was seen by Dr. Ali Lowe on 07/03/21 for recommendations regarding NSVT seen on a monitor placed after the patient had been seen in the emergency department due to presyncope after using the restroom. He was started on Toprol XL and an echocardiogram was ordered. This was completed on 07/24/21 and showed EF 45-50%, LV WMAs, mild RVE, and evidence of atrial level shunting detected by color flow Doppler. Appears to be left to right flow from atria on subcostal images suggesting ASD, and RV is dilated. Follow up TEE was recommended and set up for 08/15/21.  Today the patient presents to clinic for follow up. No CP or SOB. No LE edema, orthopnea or PND. No dizziness or syncope. No blood in stool or urine. No palpitations. Still smoking 1 pack per every three days. He read the PAD poster on the wall and thinks he may have PAD. He does have LE exertional leg pain that is relieved with rest.   Past Medical History:  Diagnosis Date   Aneurysm ([place])    per pt,in head   Arthritis    Chicken pox    Depression    Hypercholesteremia    Hypertension    Migraine    PTSD (post-traumatic stress disorder) 06/24/2017   Stroke ([place])    No residual effects   Syphilis    Tuberculosis    Vitamin D deficiency     Past Surgical History:  Procedure  Laterality Date   COLONOSCOPY     HERNIA REPAIR Right    x 2    Current Medications: Current Meds  Medication Sig   aspirin 81 MG EC tablet Take 1 tablet (81 mg total) by mouth daily. Swallow whole.   atorvastatin (LIPITOR) 20 MG tablet Take 1 tablet (20 mg total) by mouth daily at 6 PM.   Cholecalciferol (THERA-D 2000) 50 MCG (2000 UT) TABS 1 tab by mouth once daily   donepezil (ARICEPT) 10 MG tablet Take 1 tablet (10 mg total) by mouth at bedtime.   memantine (NAMENDA) 5 MG tablet Take 1 tablet (5 mg total) by mouth 2 (two) times daily.   metoprolol succinate (TOPROL XL) 25 MG 24 hr tablet Take 0.5 tablets (12.5 mg total) by mouth at bedtime.   sertraline (ZOLOFT) 25 MG tablet Take 1 tablet (25 mg total) by mouth daily.   tadalafil (CIALIS) 5 MG tablet Take 1 tablet (5 mg total) by mouth daily.   vitamin B-12 (CYANOCOBALAMIN) 100 MCG tablet Take 1 tablet (100 mcg total) by mouth daily.     Allergies:   Pollen extract and  Amlodipine   Social History   Socioeconomic History   Marital status: Widowed    Spouse name: Not on file   Number of children: 2   Years of education: 14   Highest education level: Not on file  Occupational History   Occupation: Retired  Tobacco Use   Smoking status: Every Day    Packs/day: 0.50    Years: 50.00    Pack years: 25.00    Types: Cigarettes   Smokeless tobacco: Never  Vaping Use   Vaping Use: Never used  Substance and Sexual Activity   Alcohol use: Yes    Alcohol/week: 2.0 - 3.0 standard drinks    Types: 2 - 3 Shots of liquor per week    Comment: social, 3 per day   Drug use: No   Sexual activity: Never  Other Topics Concern   Not on file  Social History Narrative   Fun: Sit on the back porch and relax.   Denies religious beliefs effecting health care.    Lives alone in a one story home.  Has 2 biological children and 3 adopted children.   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   Social Determinants of Health    Financial Resource Strain: Not on file  Food Insecurity: Not on file  Transportation Needs: Not on file  Physical Activity: Not on file  Stress: Not on file  Social Connections: Not on file     Family History: The patient's family history includes Dementia in his mother; Healthy in his father; Prostate cancer in his brother.  ROS:   Please see the history of present illness.    All other systems reviewed and are negative.  EKGs/Labs/Other Studies Reviewed:    The following studies were reviewed today:   Cardiac Tele 10/19-11/11/22 The patient's monitoring period was 06/12/2021 - 07/02/2021. Baseline sample showed Sinus Rhythm with a heart rate of 87.7 bpm. There were 2 critical, 0 serious, and 10 stable events that occurred. The report analysis of the critical, serious, stable and manually triggered events are listed below. Manually Detected Events: 1 Stable: Sinus Rhythm w/PVCs (3 in 1 min)  None or Accidental Push 1 Stable: Sinus Rhythm  Flutter or Skipped Beats 4 Stable: Sinus Rhythm  None or Accidental Push 1 Stable: Sinus Rhythm  None or Accidental Push; Tired or Fatigued 1 Stable: Sinus Rhythm w/Artifact  None or Accidental Push   The patient has been referred and is undergoing Cardiology evaluation.  __________________   Echo 07/24/21 IMPRESSIONS  1. Left ventricular ejection fraction, by estimation, is 45 to 50%. The  left ventricle has mildly decreased function. The left ventricle  demonstrates regional wall motion abnormalities. Septal hypokinesis. Left  ventricular diastolic parameters were  normal.   2. Right ventricular systolic function is normal. The right ventricular  size is mildly enlarged. There is normal pulmonary artery systolic  pressure. The estimated right ventricular systolic pressure is 27.8 mmHg.   3. The mitral valve is normal in structure. No evidence of mitral valve  regurgitation. No evidence of mitral stenosis.   4. The aortic  valve is tricuspid. 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 Aortic valve regurgitation is trivial. No aortic  stenosis is present.   5. The inferior vena cava is normal in size with greater than 50%  respiratory variability, suggesting right atrial pressure of 3 mmHg.   6. Evidence of atrial level shunting detected by color flow Doppler.  Appears to be left to  right flow from atria on subcostal images suggesting  °ASD, and RV is dilated, recommend further evaluation with either cardiac  °MRI, gated CTA, or TEE  ° ° °EKG:  EKG is ordered today.  The ekg ordered today demonstrates sinus with HR 77 bpm ° °Recent Labs: °12/12/2020: TSH 1.26 °07/09/2021: ALT 17; BUN 12; Creatinine, Ser 0.94; Hemoglobin 14.3; Magnesium 1.7; Platelets 234; Potassium 3.8; Sodium 136  °Recent Lipid Panel °   °Component Value Date/Time  ° CHOL 184 12/12/2020 1426  ° TRIG 58.0 12/12/2020 1426  ° HDL 75.10 12/12/2020 1426  ° CHOLHDL 2 12/12/2020 1426  ° VLDL 11.6 12/12/2020 1426  ° LDLCALC 97 12/12/2020 1426  ° ° ° °Risk Assessment/Calculations:   °  ° ° °Physical Exam:   ° °VS:  BP 110/70    Pulse 77    Ht 5' 10" (1.778 m)    Wt 123 lb (55.8 kg)    SpO2 100%    BMI 17.65 kg/m²    ° °Wt Readings from Last 3 Encounters:  °08/14/21 123 lb (55.8 kg)  °07/12/21 119 lb (54 kg)  °07/09/21 116 lb 13.5 oz (53 kg)  °  ° °GEN:  Well nourished, well developed in no acute distress °HEENT: Normal °NECK: No JVD °LYMPHATICS: No lymphadenopathy °CARDIAC: RRR, no murmurs, rubs, gallops °RESPIRATORY:  Clear to auscultation without rales, wheezing or rhonchi  °ABDOMEN: Soft, non-tender, non-distended °MUSCULOSKELETAL:  No edema; No deformity  °SKIN: Warm and dry °NEUROLOGIC:  Alert and oriented x 3 °PSYCHIATRIC:  Normal affect  ° °ASSESSMENT:   ° °1. Abnormal echocardiogram   °2. NSVT (nonsustained ventricular tachycardia)   °3. HYPERTENSION, BENIGN ESSENTIAL   °4. Tobacco abuse   °5. Pain in both lower extremities   ° °PLAN:   ° °In order of  problems listed above: ° °Atrial level shunting with RV dilation: will set up TEE to assess for possible ASD. This has been set up for tomorrow with Dr. Skains. Labs drawn today and transportation arrange for the morning. ° °The risks [esophageal damage, perforation (1:10,000 risk), bleeding, pharyngeal hematoma as well as other potential complications associated with conscious sedation including aspiration, arrhythmia, respiratory failure and death], benefits (treatment guidance and diagnostic support) and alternatives of a transesophageal echocardiogram were discussed in detail with Roberto Wallace and he is willing to proceed.   ° °NSVT: continue Toprol XL ° °HTN: BP well controlled today. No changes made.  ° °Tobacco abuse: still smoking about 1 pack per three days. He wants to quit as a new years resolution, which I encouraged.  ° °LE pain: patient complains of exertional leg pain. Given long smoking history, will get LE dopplers with ABIs set up.  ° ° ° °Medication Adjustments/Labs and Tests Ordered: °Current medicines are reviewed at length with the patient today.  Concerns regarding medicines are outlined above.  °Orders Placed This Encounter  °Procedures  ° Basic metabolic panel  ° CBC  ° EKG 12-Lead  ° VAS [place] LOWER EXTREMITY ARTERIAL DUPLEX  ° °No orders of the defined types were placed in this encounter. ° ° °Patient Instructions  °Medication Instructions:  °Your physician recommends that you continue on your current medications as directed. Please refer to the Current Medication list given to you today. ° °*If you need a refill on your cardiac medications before your next appointment, please call your pharmacy* ° ° °Lab Work: °Bmp, Cbc - today  ° °If you have labs (blood work) drawn today and your tests are   completely normal, you will receive your results only by: MyChart Message (if you have MyChart) OR A paper copy in the mail If you have any lab test that is abnormal or we need to change your  treatment, we will call you to review the results.   Testing/Procedures: Your physician has requested that you have a lower extremity arterial exercise duplex. During this test, exercise and ultrasound are used to evaluate arterial blood flow in the legs. Allow one hour for this exam. There are no restrictions or special instructions.  TEE Instructions below    Follow-Up: Follow up as scheduled    Other Instructions  You are scheduled for a TEE on 12/21/22with Dr. Candee Furbish .  Please arrive at the [facility] (Main Entrance A) at [facility]: [address] [place], [place] [address] at 9:30 am.   DIET: Nothing to eat or drink after midnight except a sip of water with medications (see medication instructions below)  FYI: For your safety, and to allow [place] to monitor your vital signs accurately during the surgery/procedure we request that   if you have artificial nails, gel coating, SNS etc. Please have those removed prior to your surgery/procedure. Not having the nail coverings /polish removed may result in cancellation or delay of your surgery/procedure.   You must have a responsible person to drive you home and stay in the waiting area during your procedure. Failure to do so could result in cancellation.  Bring your insurance cards.  *Special Note: Every effort is made to have your procedure done on time. Occasionally there are emergencies that occur at the hospital that may cause delays. Please be patient if a delay does occur.      Signed, Angelena Form, PA-C  08/14/2021 1:12 PM    [facility]

## 2021-08-14 NOTE — H&P (View-Only) (Signed)
HEART AND [place]                                     Cardiology Office Note:    Date:  08/14/2021   ID:  Roberto Gourd [name], DOB [birth year], MRN [id number]  PCP:  [name], MD  [facility] Cardiologist:  Early Osmond, MD  [facility] Electrophysiologist:  None   Referring MD: [name], MD   Follow up- set up TEE   History of Present Illness:    Roberto Angstadt. is a 80 y.o. male with a hx of tobacco abuse, HTN, NSVT, CVA, arthritis requiring a wheelchair and recent echo which showed atrial level shunting with RV dilation who presents to clinic to discuss setting up a TEE.   He was seen by Dr. Ali Lowe on 07/03/21 for recommendations regarding NSVT seen on a monitor placed after the patient had been seen in the emergency department due to presyncope after using the restroom. He was started on Toprol XL and an echocardiogram was ordered. This was completed on 07/24/21 and showed EF 45-50%, LV WMAs, mild RVE, and evidence of atrial level shunting detected by color flow Doppler. Appears to be left to right flow from atria on subcostal images suggesting ASD, and RV is dilated. Follow up TEE was recommended and set up for 08/15/21.  Today the patient presents to clinic for follow up. No CP or SOB. No LE edema, orthopnea or PND. No dizziness or syncope. No blood in stool or urine. No palpitations. Still smoking 1 pack per every three days. He read the PAD poster on the wall and thinks he may have PAD. He does have LE exertional leg pain that is relieved with rest.   Past Medical History:  Diagnosis Date   Aneurysm ([place])    per pt,in head   Arthritis    Chicken pox    Depression    Hypercholesteremia    Hypertension    Migraine    PTSD (post-traumatic stress disorder) 06/24/2017   Stroke ([place])    No residual effects   Syphilis    Tuberculosis    Vitamin D deficiency     Past Surgical History:  Procedure  Laterality Date   COLONOSCOPY     HERNIA REPAIR Right    x 2    Current Medications: Current Meds  Medication Sig   aspirin 81 MG EC tablet Take 1 tablet (81 mg total) by mouth daily. Swallow whole.   atorvastatin (LIPITOR) 20 MG tablet Take 1 tablet (20 mg total) by mouth daily at 6 PM.   Cholecalciferol (THERA-D 2000) 50 MCG (2000 UT) TABS 1 tab by mouth once daily   donepezil (ARICEPT) 10 MG tablet Take 1 tablet (10 mg total) by mouth at bedtime.   memantine (NAMENDA) 5 MG tablet Take 1 tablet (5 mg total) by mouth 2 (two) times daily.   metoprolol succinate (TOPROL XL) 25 MG 24 hr tablet Take 0.5 tablets (12.5 mg total) by mouth at bedtime.   sertraline (ZOLOFT) 25 MG tablet Take 1 tablet (25 mg total) by mouth daily.   tadalafil (CIALIS) 5 MG tablet Take 1 tablet (5 mg total) by mouth daily.   vitamin B-12 (CYANOCOBALAMIN) 100 MCG tablet Take 1 tablet (100 mcg total) by mouth daily.     Allergies:   Pollen extract and  Amlodipine   Social History   Socioeconomic History   Marital status: Widowed    Spouse name: Not on file   Number of children: 2   Years of education: 14   Highest education level: Not on file  Occupational History   Occupation: Retired  Tobacco Use   Smoking status: Every Day    Packs/day: 0.50    Years: 50.00    Pack years: 25.00    Types: Cigarettes   Smokeless tobacco: Never  Vaping Use   Vaping Use: Never used  Substance and Sexual Activity   Alcohol use: Yes    Alcohol/week: 2.0 - 3.0 standard drinks    Types: 2 - 3 Shots of liquor per week    Comment: social, 3 per day   Drug use: No   Sexual activity: Never  Other Topics Concern   Not on file  Social History Narrative   Fun: Sit on the back porch and relax.   Denies religious beliefs effecting health care.    Lives alone in a one story home.  Has 2 biological children and 3 adopted children.   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   Social Determinants of Health    Financial Resource Strain: Not on file  Food Insecurity: Not on file  Transportation Needs: Not on file  Physical Activity: Not on file  Stress: Not on file  Social Connections: Not on file     Family History: The patient's family history includes Dementia in his mother; Healthy in his father; Prostate cancer in his brother.  ROS:   Please see the history of present illness.    All other systems reviewed and are negative.  EKGs/Labs/Other Studies Reviewed:    The following studies were reviewed today:   Cardiac Tele 10/19-11/11/22 The patient's monitoring period was 06/12/2021 - 07/02/2021. Baseline sample showed Sinus Rhythm with a heart rate of 87.7 bpm. There were 2 critical, 0 serious, and 10 stable events that occurred. The report analysis of the critical, serious, stable and manually triggered events are listed below. Manually Detected Events: 1 Stable: Sinus Rhythm w/PVCs (3 in 1 min)  None or Accidental Push 1 Stable: Sinus Rhythm  Flutter or Skipped Beats 4 Stable: Sinus Rhythm  None or Accidental Push 1 Stable: Sinus Rhythm  None or Accidental Push; Tired or Fatigued 1 Stable: Sinus Rhythm w/Artifact  None or Accidental Push   The patient has been referred and is undergoing Cardiology evaluation.  __________________   Echo 07/24/21 IMPRESSIONS  1. Left ventricular ejection fraction, by estimation, is 45 to 50%. The  left ventricle has mildly decreased function. The left ventricle  demonstrates regional wall motion abnormalities. Septal hypokinesis. Left  ventricular diastolic parameters were  normal.   2. Right ventricular systolic function is normal. The right ventricular  size is mildly enlarged. There is normal pulmonary artery systolic  pressure. The estimated right ventricular systolic pressure is 27.8 mmHg.   3. The mitral valve is normal in structure. No evidence of mitral valve  regurgitation. No evidence of mitral stenosis.   4. The aortic  valve is tricuspid. 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 Aortic valve regurgitation is trivial. No aortic  stenosis is present.   5. The inferior vena cava is normal in size with greater than 50%  respiratory variability, suggesting right atrial pressure of 3 mmHg.   6. Evidence of atrial level shunting detected by color flow Doppler.  Appears to be left to  right flow from atria on subcostal images suggesting  ASD, and RV is dilated, recommend further evaluation with either cardiac  MRI, gated CTA, or TEE    EKG:  EKG is ordered today.  The ekg ordered today demonstrates sinus with HR 77 bpm  Recent Labs: 12/12/2020: TSH 1.26 07/09/2021: ALT 17; BUN 12; Creatinine, Ser 0.94; Hemoglobin 14.3; Magnesium 1.7; Platelets 234; Potassium 3.8; Sodium 136  Recent Lipid Panel    Component Value Date/Time   CHOL 184 12/12/2020 1426   TRIG 58.0 12/12/2020 1426   HDL 75.10 12/12/2020 1426   CHOLHDL 2 12/12/2020 1426   VLDL 11.6 12/12/2020 1426   LDLCALC 97 12/12/2020 1426     Risk Assessment/Calculations:       Physical Exam:    VS:  BP 110/70    Pulse 77    Ht 5\' 10"  (1.778 m)    Wt 123 lb (55.8 kg)    SpO2 100%    BMI 17.65 kg/m     Wt Readings from Last 3 Encounters:  08/14/21 123 lb (55.8 kg)  07/12/21 119 lb (54 kg)  07/09/21 116 lb 13.5 oz (53 kg)     GEN:  Well nourished, well developed in no acute distress HEENT: Normal NECK: No JVD LYMPHATICS: No lymphadenopathy CARDIAC: RRR, no murmurs, rubs, gallops RESPIRATORY:  Clear to auscultation without rales, wheezing or rhonchi  ABDOMEN: Soft, non-tender, non-distended MUSCULOSKELETAL:  No edema; No deformity  SKIN: Warm and dry NEUROLOGIC:  Alert and oriented x 3 PSYCHIATRIC:  Normal affect   ASSESSMENT:    1. Abnormal echocardiogram   2. NSVT (nonsustained ventricular tachycardia)   3. HYPERTENSION, BENIGN ESSENTIAL   4. Tobacco abuse   5. Pain in both lower extremities    PLAN:    In order of  problems listed above:  Atrial level shunting with RV dilation: will set up TEE to assess for possible ASD. This has been set up for tomorrow with Dr. Marlou Porch. Labs drawn today and transportation arrange for the morning.  The risks [esophageal damage, perforation (1:10,000 risk), bleeding, pharyngeal hematoma as well as other potential complications associated with conscious sedation including aspiration, arrhythmia, respiratory failure and death], benefits (treatment guidance and diagnostic support) and alternatives of a transesophageal echocardiogram were discussed in detail with Roberto Wallace and he is willing to proceed.    NSVT: continue Toprol XL  HTN: BP well controlled today. No changes made.   Tobacco abuse: still smoking about 1 pack per three days. He wants to quit as a new years resolution, which I encouraged.   LE pain: patient complains of exertional leg pain. Given long smoking history, will get LE dopplers with ABIs set up.     Medication Adjustments/Labs and Tests Ordered: Current medicines are reviewed at length with the patient today.  Concerns regarding medicines are outlined above.  Orders Placed This Encounter  Procedures   Basic metabolic panel   CBC   EKG 12-Lead   VAS [place] LOWER EXTREMITY ARTERIAL DUPLEX   No orders of the defined types were placed in this encounter.   Patient Instructions  Medication Instructions:  Your physician recommends that you continue on your current medications as directed. Please refer to the Current Medication list given to you today.  *If you need a refill on your cardiac medications before your next appointment, please call your pharmacy*   Lab Work: Bmp, Cbc - today   If you have labs (blood work) drawn today and your tests are  completely normal, you will receive your results only by: MyChart Message (if you have MyChart) OR A paper copy in the mail If you have any lab test that is abnormal or we need to change your  treatment, we will call you to review the results.   Testing/Procedures: Your physician has requested that you have a lower extremity arterial exercise duplex. During this test, exercise and ultrasound are used to evaluate arterial blood flow in the legs. Allow one hour for this exam. There are no restrictions or special instructions.  TEE Instructions below    Follow-Up: Follow up as scheduled    Other Instructions  You are scheduled for a TEE on 12/21/22with Dr. Candee Furbish .  Please arrive at the [facility] (Main Entrance A) at [facility]: [address] [place], [place] [address] at 9:30 am.   DIET: Nothing to eat or drink after midnight except a sip of water with medications (see medication instructions below)  FYI: For your safety, and to allow [place] to monitor your vital signs accurately during the surgery/procedure we request that   if you have artificial nails, gel coating, SNS etc. Please have those removed prior to your surgery/procedure. Not having the nail coverings /polish removed may result in cancellation or delay of your surgery/procedure.   You must have a responsible person to drive you home and stay in the waiting area during your procedure. Failure to do so could result in cancellation.  Bring your insurance cards.  *Special Note: Every effort is made to have your procedure done on time. Occasionally there are emergencies that occur at the hospital that may cause delays. Please be patient if a delay does occur.      Signed, Angelena Form, PA-C  08/14/2021 1:12 PM    [facility]

## 2021-08-14 NOTE — Patient Instructions (Addendum)
Medication Instructions:  Your physician recommends that you continue on your current medications as directed. Please refer to the Current Medication list given to you today.  *If you need a refill on your cardiac medications before your next appointment, please call your pharmacy*   Lab Work: Bmp, Cbc - today   If you have labs (blood work) drawn today and your tests are completely normal, you will receive your results only by: MyChart Message (if you have MyChart) OR A paper copy in the mail If you have any lab test that is abnormal or we need to change your treatment, we will call you to review the results.   Testing/Procedures: Your physician has requested that you have a lower extremity arterial exercise duplex. During this test, exercise and ultrasound are used to evaluate arterial blood flow in the legs. Allow one hour for this exam. There are no restrictions or special instructions.  TEE Instructions below    Follow-Up: Follow up as scheduled    Other Instructions  You are scheduled for a TEE on 08/15/21 with Dr. Donato Schultz .  Please arrive at the [facility] (Main Entrance A) at [facility]: [address] [place], [place] [address] at 9:30 am.   DIET: Nothing to eat or drink after midnight except a sip of water with medications (see medication instructions below)  FYI: For your safety, and to allow [place] to monitor your vital signs accurately during the surgery/procedure we request that   if you have artificial nails, gel coating, SNS etc. Please have those removed prior to your surgery/procedure. Not having the nail coverings /polish removed may result in cancellation or delay of your surgery/procedure.   You must have a responsible person to drive you home and stay in the waiting area during your procedure. Failure to do so could result in cancellation.  Bring your insurance cards.  *Special Note: Every effort is made to have your procedure done on time.  Occasionally there are emergencies that occur at the hospital that may cause delays. Please be patient if a delay does occur.

## 2021-08-15 ENCOUNTER — Ambulatory Visit (HOSPITAL_COMMUNITY): Payer: Medicare Other | Admitting: Anesthesiology

## 2021-08-15 ENCOUNTER — Encounter (HOSPITAL_COMMUNITY): Payer: Self-pay | Admitting: Cardiology

## 2021-08-15 ENCOUNTER — Encounter (HOSPITAL_COMMUNITY)
Admission: RE | Disposition: A | Discharge status: Home / Self Care | Payer: Self-pay | Source: Home / Self Care | Attending: Cardiology

## 2021-08-15 ENCOUNTER — Ambulatory Visit (HOSPITAL_BASED_OUTPATIENT_CLINIC_OR_DEPARTMENT_OTHER): Payer: Medicare Other

## 2021-08-15 ENCOUNTER — Ambulatory Visit (HOSPITAL_COMMUNITY)
Admission: RE | Admit: 2021-08-15 | Discharge: 2021-08-15 | Disposition: A | Discharge status: Home / Self Care | Payer: Medicare Other | Attending: Cardiology | Admitting: Cardiology

## 2021-08-15 ENCOUNTER — Other Ambulatory Visit: Payer: Self-pay

## 2021-08-15 DIAGNOSIS — F17210 Nicotine dependence, cigarettes, uncomplicated: Secondary | ICD-10-CM | POA: Diagnosis not present

## 2021-08-15 DIAGNOSIS — I10 Essential (primary) hypertension: Secondary | ICD-10-CM | POA: Diagnosis not present

## 2021-08-15 DIAGNOSIS — M79605 Pain in left leg: Secondary | ICD-10-CM | POA: Insufficient documentation

## 2021-08-15 DIAGNOSIS — Q211 Atrial septal defect, unspecified: Secondary | ICD-10-CM | POA: Diagnosis not present

## 2021-08-15 DIAGNOSIS — I472 Ventricular tachycardia, unspecified: Secondary | ICD-10-CM | POA: Diagnosis not present

## 2021-08-15 DIAGNOSIS — Q2111 Secundum atrial septal defect: Secondary | ICD-10-CM | POA: Insufficient documentation

## 2021-08-15 DIAGNOSIS — E7800 Pure hypercholesterolemia, unspecified: Secondary | ICD-10-CM | POA: Diagnosis not present

## 2021-08-15 DIAGNOSIS — Z79899 Other long term (current) drug therapy: Secondary | ICD-10-CM | POA: Insufficient documentation

## 2021-08-15 DIAGNOSIS — I083 Combined rheumatic disorders of mitral, aortic and tricuspid valves: Secondary | ICD-10-CM | POA: Diagnosis not present

## 2021-08-15 DIAGNOSIS — M79604 Pain in right leg: Secondary | ICD-10-CM | POA: Diagnosis not present

## 2021-08-15 HISTORY — PX: TEE WITHOUT CARDIOVERSION: SHX5443

## 2021-08-15 HISTORY — PX: BUBBLE STUDY: SHX6837

## 2021-08-15 SURGERY — ECHOCARDIOGRAM, TRANSESOPHAGEAL
Anesthesia: Monitor Anesthesia Care

## 2021-08-15 MED ORDER — SODIUM CHLORIDE 0.9 % IV SOLN: INTRAVENOUS | Status: DC

## 2021-08-15 MED ORDER — PHENYLEPHRINE 40 MCG/ML (10ML) SYRINGE FOR IV PUSH (FOR BLOOD PRESSURE SUPPORT)
PREFILLED_SYRINGE | INTRAVENOUS | Status: DC | PRN
  Administered 2021-08-15: 160 ug via INTRAVENOUS
  Administered 2021-08-15 (×2): 80 ug via INTRAVENOUS
  Administered 2021-08-15: 160 ug via INTRAVENOUS

## 2021-08-15 MED ORDER — PROPOFOL 10 MG/ML IV BOLUS
INTRAVENOUS | Status: DC | PRN
  Administered 2021-08-15 (×2): 20 mg via INTRAVENOUS

## 2021-08-15 MED ORDER — PROPOFOL 500 MG/50ML IV EMUL
INTRAVENOUS | Status: DC | PRN
  Administered 2021-08-15: 75 ug/kg/min via INTRAVENOUS

## 2021-08-15 MED ORDER — LIDOCAINE 2% (20 MG/ML) 5 ML SYRINGE
INTRAMUSCULAR | Status: DC | PRN
  Administered 2021-08-15: 40 mg via INTRAVENOUS

## 2021-08-15 NOTE — CV Procedure (Signed)
See syngo report  ASD

## 2021-08-15 NOTE — Anesthesia Preprocedure Evaluation (Signed)
Anesthesia Evaluation  Patient identified by MRN, date of birth, ID band Patient awake    Reviewed: Allergy & Precautions, NPO status , Patient's Chart, lab work & pertinent test results  Airway Mallampati: II  TM Distance: >3 FB Neck ROM: Full    Dental no notable dental hx.    Pulmonary neg pulmonary ROS, Current Smoker and Patient abstained from smoking.,    Pulmonary exam normal breath sounds clear to auscultation       Cardiovascular hypertension, Pt. on medications Normal cardiovascular exam+ dysrhythmias + Valvular Problems/Murmurs  Rhythm:Regular Rate:Normal     Neuro/Psych  Headaches, Anxiety Depression Dementia CVA negative psych ROS   GI/Hepatic negative GI ROS, Neg liver ROS,   Endo/Other  negative endocrine ROS  Renal/GU negative Renal ROS  negative genitourinary   Musculoskeletal  (+) Arthritis , Osteoarthritis,    Abdominal   Peds negative pediatric ROS (+)  Hematology negative hematology ROS (+)   Anesthesia Other Findings   Reproductive/Obstetrics negative OB ROS                             Anesthesia Physical Anesthesia Plan  ASA: 3  Anesthesia Plan: MAC   Post-op Pain Management:    Induction: Intravenous  PONV Risk Score and Plan: 0 and Treatment may vary due to age or medical condition  Airway Management Planned: Nasal Cannula  Additional Equipment:   Intra-op Plan:   Post-operative Plan:   Informed Consent: I have reviewed the patients History and Physical, chart, labs and discussed the procedure including the risks, benefits and alternatives for the proposed anesthesia with the patient or authorized representative who has indicated his/her understanding and acceptance.     Dental advisory given  Plan Discussed with: CRNA  Anesthesia Plan Comments:         Anesthesia Quick Evaluation

## 2021-08-15 NOTE — Transfer of Care (Signed)
Immediate Anesthesia Transfer of Care Note  Patient: Roberto CIPOLLONE [name]  Procedure(s) Performed: TRANSESOPHAGEAL ECHOCARDIOGRAM (TEE) BUBBLE STUDY  Patient Location: PACU  Anesthesia Type:MAC  Level of Consciousness: awake, alert , oriented and patient cooperative  Airway & Oxygen Therapy: Patient Spontanous Breathing and Patient connected to nasal cannula oxygen  Post-op Assessment: Report given to RN and Post -op Vital signs reviewed and stable  Post vital signs: Reviewed and stable  Last Vitals:  Vitals Value Taken Time  BP 119/67   Temp    Pulse 81 08/15/21 1156  Resp 17 08/15/21 1156  SpO2 100 % 08/15/21 1156  Vitals shown include unvalidated device data.  Last Pain:  Vitals:   08/15/21 1004  TempSrc: Temporal  PainSc: 0-No pain         Complications: No notable events documented.

## 2021-08-15 NOTE — Interval H&P Note (Signed)
History and Physical Interval Note:  08/15/2021 12:47 PM  Roberto Hatchet [name]  has presented today for surgery, with the diagnosis of ABNORMAL ECHO EVALUATION FOR ASD.  The various methods of treatment have been discussed with the patient and family. After consideration of risks, benefits and other options for treatment, the patient has consented to  Procedure(s): TRANSESOPHAGEAL ECHOCARDIOGRAM (TEE) (N/A) BUBBLE STUDY as a surgical intervention.  The patient's history has been reviewed, patient examined, no change in status, stable for surgery.  I have reviewed the patient's chart and labs.  Questions were answered to the patient's satisfaction.     [employer]

## 2021-08-15 NOTE — Anesthesia Procedure Notes (Signed)
Procedure Name: MAC Date/Time: 08/15/2021 11:04 AM Performed by: Reece Agar, CRNA Pre-anesthesia Checklist: Patient identified, Emergency Drugs available, Suction available and Patient being monitored Patient Re-evaluated:Patient Re-evaluated prior to induction Oxygen Delivery Method: Nasal cannula

## 2021-08-15 NOTE — Discharge Instructions (Signed)

## 2021-08-16 NOTE — Anesthesia Postprocedure Evaluation (Signed)
Anesthesia Post Note  Patient: Roberto Wallace.  Procedure(s) Performed: TRANSESOPHAGEAL ECHOCARDIOGRAM (TEE) BUBBLE STUDY     Patient location during evaluation: Endoscopy Anesthesia Type: MAC Level of consciousness: awake and alert Pain management: pain level controlled Vital Signs Assessment: post-procedure vital signs reviewed and stable Respiratory status: spontaneous breathing, nonlabored ventilation and respiratory function stable Cardiovascular status: blood pressure returned to baseline and stable Postop Assessment: no apparent nausea or vomiting Anesthetic complications: no   No notable events documented.  Last Vitals:  Vitals:   08/15/21 1210 08/15/21 1221  BP: (!) 124/100 (!) 133/96  Pulse: 80 82  Resp: (!) 25 19  Temp:    SpO2: 99% 96%    Last Pain:  Vitals:   08/15/21 1210  TempSrc:   PainSc: 0-No pain                 Lynda Rainwater

## 2021-08-19 ENCOUNTER — Encounter (HOSPITAL_COMMUNITY): Payer: Self-pay | Admitting: Cardiology

## 2021-08-23 ENCOUNTER — Other Ambulatory Visit (HOSPITAL_COMMUNITY): Payer: Self-pay | Admitting: Physician Assistant

## 2021-08-23 DIAGNOSIS — I739 Peripheral vascular disease, unspecified: Secondary | ICD-10-CM

## 2021-08-28 ENCOUNTER — Ambulatory Visit (HOSPITAL_COMMUNITY)
Admission: RE | Admit: 2021-08-28 | Discharge: 2021-08-28 | Disposition: A | Discharge status: Home / Self Care | Payer: Medicare Other | Source: Ambulatory Visit | Attending: Internal Medicine | Admitting: Internal Medicine

## 2021-08-28 ENCOUNTER — Other Ambulatory Visit: Payer: Self-pay

## 2021-08-28 DIAGNOSIS — M79604 Pain in right leg: Secondary | ICD-10-CM | POA: Diagnosis not present

## 2021-08-28 DIAGNOSIS — M79605 Pain in left leg: Secondary | ICD-10-CM | POA: Insufficient documentation

## 2021-08-28 DIAGNOSIS — I739 Peripheral vascular disease, unspecified: Secondary | ICD-10-CM | POA: Insufficient documentation

## 2021-09-02 ENCOUNTER — Telehealth: Payer: Self-pay

## 2021-09-02 NOTE — Telephone Encounter (Signed)
Per Dr. Lynnette Caffey, called to discuss TEE results.  TEE confirmed ASD. Will bring in the patient in for visit with Dr. Lynnette Caffey to discuss results. Left message to call back.  There have been several attempts to call patient. He is scheduled 2/13 already with Dr. Lynnette Caffey if he is unable to be reached.

## 2021-09-04 ENCOUNTER — Other Ambulatory Visit: Payer: Self-pay | Admitting: Physician Assistant

## 2021-09-04 NOTE — Telephone Encounter (Signed)
The patient is coming 1/24 to see Dr. Lynnette Caffey to discuss results.

## 2021-09-17 ENCOUNTER — Ambulatory Visit: Payer: Medicare Other | Admitting: Internal Medicine

## 2021-09-23 ENCOUNTER — Encounter: Payer: Self-pay | Admitting: Internal Medicine

## 2021-09-23 ENCOUNTER — Ambulatory Visit (INDEPENDENT_AMBULATORY_CARE_PROVIDER_SITE_OTHER): Payer: Medicare Other | Admitting: Internal Medicine

## 2021-09-23 ENCOUNTER — Other Ambulatory Visit: Payer: Self-pay

## 2021-09-23 VITALS — BP 120/82 | HR 84 | Temp 98.7°F | Ht 70.0 in | Wt 114.0 lb

## 2021-09-23 DIAGNOSIS — I10 Essential (primary) hypertension: Secondary | ICD-10-CM

## 2021-09-23 DIAGNOSIS — H409 Unspecified glaucoma: Secondary | ICD-10-CM | POA: Insufficient documentation

## 2021-09-23 DIAGNOSIS — R519 Headache, unspecified: Secondary | ICD-10-CM | POA: Insufficient documentation

## 2021-09-23 DIAGNOSIS — E559 Vitamin D deficiency, unspecified: Secondary | ICD-10-CM

## 2021-09-23 DIAGNOSIS — E7800 Pure hypercholesterolemia, unspecified: Secondary | ICD-10-CM | POA: Diagnosis not present

## 2021-09-23 DIAGNOSIS — F339 Major depressive disorder, recurrent, unspecified: Secondary | ICD-10-CM | POA: Insufficient documentation

## 2021-09-23 DIAGNOSIS — R269 Unspecified abnormalities of gait and mobility: Secondary | ICD-10-CM

## 2021-09-23 DIAGNOSIS — I739 Peripheral vascular disease, unspecified: Secondary | ICD-10-CM

## 2021-09-23 DIAGNOSIS — F1410 Cocaine abuse, uncomplicated: Secondary | ICD-10-CM | POA: Insufficient documentation

## 2021-09-23 DIAGNOSIS — K4090 Unilateral inguinal hernia, without obstruction or gangrene, not specified as recurrent: Secondary | ICD-10-CM | POA: Insufficient documentation

## 2021-09-23 DIAGNOSIS — E538 Deficiency of other specified B group vitamins: Secondary | ICD-10-CM | POA: Diagnosis not present

## 2021-09-23 DIAGNOSIS — R739 Hyperglycemia, unspecified: Secondary | ICD-10-CM

## 2021-09-23 DIAGNOSIS — M2062 Acquired deformities of toe(s), unspecified, left foot: Secondary | ICD-10-CM | POA: Insufficient documentation

## 2021-09-23 DIAGNOSIS — I130 Hypertensive heart and chronic kidney disease with heart failure and stage 1 through stage 4 chronic kidney disease, or unspecified chronic kidney disease: Secondary | ICD-10-CM | POA: Insufficient documentation

## 2021-09-23 DIAGNOSIS — I779 Disorder of arteries and arterioles, unspecified: Secondary | ICD-10-CM | POA: Insufficient documentation

## 2021-09-23 NOTE — Progress Notes (Addendum)
Patient ID: Roberto Felt., male   DOB: 04-25-1941, 81 y.o.   MRN: [id number]        Chief Complaint: follow up gait disorder with PAD, hyperglycemia, hld, htn       HPI:  Roberto Doorn. is a 81 y.o. male here with c/o worsening gait ability with known PAD, has been in hoverround for 6 yrs now wearing out, can only walk about 5 ft without assist, no recent falls.  Pt denies chest pain, increased sob or doe, wheezing, orthopnea, PND, increased LE swelling, palpitations, dizziness or syncope.   Pt denies polydipsia, polyuria, or new focal neuro s/s.   Pt denies fever, wt loss, night sweats, loss of appetite, or other constitutional symptoms     The major reason for this visit is to conduct a mobility exam Pt has required a hover-round  x 6 yrs with worsening PAD and requires new equipment.  No pressure sores.  Pt has ability to shift weight Pt has ability to assist with transfer to and from equipment Pt can ambulate approximately 5 feet only due to PAD and LE vascular disease, associated now with generalized weakness RUE and LUE and RLE and LLE are 5/5 motor; has no pain at rest but severe claudication pain with ambulation to lower extremities Joints are FROM to UE's and LE's Gait is severely impaired shuffling and history of falls due to claudication and PAD to 5 ft only limit PMD is required for all ADLs including get to the Bathroom to toilet, bathe, get to the kitchen to prepare meals/cook and eat, and get to the bedroom to groom and dress.  Cane or walker will not be an option due to severe symptoms PAD with any ambulation more than 5 ft.   Manual Wheelchair not an option due to PAD and generalized weakness POV not an option due to lack of postural stability Equipment meant to be used in the home, and occasionally in the community such as doctor visits Pt can safely operate the PMD both mentally and physically Pt is willing and motivated to use the PMD in the home  Wt Readings  from Last 3 Encounters:  09/27/21 124 lb (56.2 kg)  09/23/21 114 lb (51.7 kg)  08/15/21 124 lb (56.2 kg)   BP Readings from Last 3 Encounters:  09/27/21 114/74  09/23/21 120/82  08/15/21 (!) 133/96         Past Medical History:  Diagnosis Date   Aneurysm ([place])    per pt,in head   Arthritis    Chicken pox    Depression    Hypercholesteremia    Hypertension    Migraine    PTSD (post-traumatic stress disorder) 06/24/2017   Stroke ([place])    No residual effects   Syphilis    Tuberculosis    Vitamin D deficiency    Past Surgical History:  Procedure Laterality Date   BUBBLE STUDY  08/15/2021   Procedure: BUBBLE STUDY;  Surgeon: Jerline Pain, MD;  Location: [facility];  Service: Cardiovascular;;   COLONOSCOPY     HERNIA REPAIR Right    x 2   TEE WITHOUT CARDIOVERSION N/A 08/15/2021   Procedure: TRANSESOPHAGEAL ECHOCARDIOGRAM (TEE);  Surgeon: Jerline Pain, MD;  Location: [facility] ENDOSCOPY;  Service: Cardiovascular;  Laterality: N/A;    reports that he has been smoking cigarettes. He has a 25.00 pack-year smoking history. He has never used smokeless tobacco. He reports current alcohol use of about 2.0 - 3.0  standard drinks per week. He reports that he does not use drugs. family history includes Dementia in his mother; Healthy in his father; Prostate cancer in his brother. Allergies  Allergen Reactions   Pollen Extract     Other reaction(s): Sinusitis   Amlodipine Rash   Current Outpatient Medications on File Prior to Visit  Medication Sig Dispense Refill   fexofenadine (ALLEGRA) 180 MG tablet TAKE ONE TABLET     hydrochlorothiazide (HYDRODIURIL) 25 MG tablet TAKE ONE TABLET BY MOUTH     losartan (COZAAR) 100 MG tablet TAKE ONE TABLET BY MOUTH     aspirin 81 MG EC tablet Take 1 tablet (81 mg total) by mouth daily. Swallow whole. 30 tablet 12   atorvastatin (LIPITOR) 20 MG tablet Take 1 tablet (20 mg total) by mouth daily at 6 PM. 90 tablet 3   Cholecalciferol (THERA-D 2000)  50 MCG (2000 UT) TABS 1 tab by mouth once daily 90 tablet 99   donepezil (ARICEPT) 10 MG tablet Take 1 tablet (10 mg total) by mouth at bedtime. 90 tablet 3   FLUZONE HIGH-DOSE QUADRIVALENT 0.7 ML SUSY      memantine (NAMENDA) 5 MG tablet Take 1 tablet (5 mg total) by mouth 2 (two) times daily. 180 tablet 3   metoprolol succinate (TOPROL XL) 25 MG 24 hr tablet Take 0.5 tablets (12.5 mg total) by mouth at bedtime. 45 tablet 3   PNEUMOVAX 23 25 MCG/0.5ML injection      sertraline (ZOLOFT) 25 MG tablet Take 1 tablet (25 mg total) by mouth daily. 90 tablet 3   tadalafil (CIALIS) 5 MG tablet Take 1 tablet (5 mg total) by mouth daily. 30 tablet 11   vitamin B-12 (CYANOCOBALAMIN) 100 MCG tablet Take 1 tablet (100 mcg total) by mouth daily. 90 tablet 3   No current facility-administered medications on file prior to visit.        ROS:  All others reviewed and negative.  Objective        PE:  BP 120/82 (BP Location: Left Arm, Patient Position: Sitting, Cuff Size: Large)    Pulse 84    Temp 98.7 F (37.1 C) (Oral)    Ht 5\' 10"  (1.778 m)    Wt 114 lb (51.7 kg)    SpO2 97%    BMI 16.36 kg/m                 Constitutional: Pt appears in NAD               HENT: Head: NCAT.                Right Ear: External ear normal.                 Left Ear: External ear normal.                Eyes: . Pupils are equal, round, and reactive to light. Conjunctivae and EOM are normal               Nose: without d/c or deformity               Neck: Neck supple. Gross normal ROM               Cardiovascular: Normal rate and regular rhythm.                 Pulmonary/Chest: Effort normal and breath sounds without rales or wheezing.  Abd:  Soft, NT, ND, + BS, no organomegaly               Neurological: Pt is alert. At baseline orientation, motor grossly intact but with generalized weakness mild to moderate; RUE and LUE and RLE and LLE are 5/5 motor; has no pain at rest but severe claudication pain with  ambulation to lower extermities               Pulses - trace to minimal bilateral dorsalis pedis               Skin: Skin is warm. No rashes, no other new lesions, LE edema - none               Psychiatric: Pt behavior is normal without agitation   Micro: none  Cardiac tracings I have personally interpreted today:  none  Pertinent Radiological findings (summarize): none   Lab Results  Component Value Date   WBC 6.5 08/14/2021   HGB 13.7 08/14/2021   HCT 40.5 08/14/2021   PLT 231 08/14/2021   GLUCOSE 90 08/14/2021   CHOL 184 12/12/2020   TRIG 58.0 12/12/2020   HDL 75.10 12/12/2020   LDLCALC 97 12/12/2020   ALT 17 07/09/2021   AST 18 07/09/2021   NA 140 08/14/2021   K 4.7 08/14/2021   CL 101 08/14/2021   CREATININE 0.79 08/14/2021   BUN 14 08/14/2021   CO2 29 08/14/2021   TSH 1.26 12/12/2020   PSA 2.77 12/12/2020   INR 0.99 10/16/2017   HGBA1C 5.6 06/03/2021   Assessment/Plan:  Roberto Gourd [name] is a 81 y.o. Black or African American [2] male with  has a past medical history of Aneurysm ([place]), Arthritis, Chicken pox, Depression, Hypercholesteremia, Hypertension, Migraine, PTSD (post-traumatic stress disorder) (06/24/2017), Stroke ([place]), Syphilis, Tuberculosis, and Vitamin D deficiency.  B12 deficiency Lab Results  Component Value Date   VITAMINB12 152 (L) 12/12/2020   Low, to start oral replacement - b12 1000 mcg qd   Hyperglycemia Lab Results  Component Value Date   HGBA1C 5.6 06/03/2021   Stable, pt to continue current medical treatment  - diet   Hyperlipidemia Lab Results  Component Value Date   LDLCALC 97 12/12/2020   Stable, pt to continue current statin lipitor 20   HYPERTENSION, BENIGN ESSENTIAL BP Readings from Last 3 Encounters:  09/27/21 114/74  09/23/21 120/82  08/15/21 (!) 133/96   Stable, pt to continue medical treatment hct, losartan, lopressor   Vitamin D deficiency Last vitamin D Lab Results  Component Value Date   VD25OH  21.47 (L) 12/12/2020   Low, to start oral replacement   Gait disorder Pt needs new hoverround and states current one is 81 yo, states paperwork coming to be filled out  Claudication [facility]) With hx of PAD - can only walk about 5 ft per pt to get ADLs done  Followup: Return in about 6 months (around 03/23/2022).  Cathlean Cower, MD 09/29/2021 8:41 PM [facility]

## 2021-09-23 NOTE — Patient Instructions (Signed)
We will be on the lookout for paperwork for the new hoverround  Please continue all other medications as before, and refills have been done if requested.  Please have the pharmacy call with any other refills you may need.  Please continue your efforts at being more active, low cholesterol diet, and weight control.  Please keep your appointments with your specialists as you may have planned

## 2021-09-27 ENCOUNTER — Encounter: Payer: Self-pay | Admitting: Internal Medicine

## 2021-09-27 ENCOUNTER — Ambulatory Visit (INDEPENDENT_AMBULATORY_CARE_PROVIDER_SITE_OTHER): Payer: Medicare Other | Admitting: Internal Medicine

## 2021-09-27 ENCOUNTER — Other Ambulatory Visit: Payer: Self-pay

## 2021-09-27 VITALS — BP 114/74 | HR 76 | Ht 69.0 in | Wt 124.0 lb

## 2021-09-27 DIAGNOSIS — Q211 Atrial septal defect, unspecified: Secondary | ICD-10-CM

## 2021-09-27 NOTE — Patient Instructions (Signed)
Medication Instructions:  NO CHANGES *If you need a refill on your cardiac medications before your next appointment, please call your pharmacy*   Lab Work: NONE If you have labs (blood work) drawn today and your tests are completely normal, you will receive your results only by: MyChart Message (if you have MyChart) OR A paper copy in the mail If you have any lab test that is abnormal or we need to change your treatment, we will call you to review the results.   Testing/Procedures: NONE   Follow-Up: At [facility], you and your health needs are our priority.  As part of our continuing mission to provide you with exceptional heart care, we have created designated Provider Care Teams.  These Care Teams include your primary Cardiologist (physician) and Advanced Practice Providers (APPs -  Physician Assistants and Nurse Practitioners) who all work together to provide you with the care you need, when you need it.  We recommend signing up for the patient portal called "MyChart".  Sign up information is provided on this After Visit Summary.  MyChart is used to connect with patients for Virtual Visits (Telemedicine).  Patients are able to view lab/test results, encounter notes, upcoming appointments, etc.  Non-urgent messages can be sent to your provider as well.   To learn more about what you can do with MyChart, go to [email or web address].    Your next appointment:   6 month(s)  The format for your next appointment:   In Person  Provider:   [facility] D[facility]}    Other Instructions NONE

## 2021-09-27 NOTE — Progress Notes (Signed)
Cardiology Office Note:    Date:  09/27/2021   ID:  Roberto Gourd [name], DOB April 29, 1941, MRN [id number]  PCP:  Roberto Borg, MD   [facility] Prov[facility] Cardiologist:  Roberto Sciara, MD Referring MD: Roberto Borg, MD   Chief Complaint/Reason for Referral: Atrial septal defect  ASSESSMENT:    ASD (atrial septal defect)    PLAN:    In order of problems listed above:  I reviewed the imaging data with the patient.  He is a relatively elderly gentleman.  He has a low functional capacity as he is in a wheelchair most days due to arthritis.  I did speak to him about potential transcatheter closure of his ASD.  He does have evidence of a right chamber enlargement.  I think it be reasonable perhaps not offer closure in this relatively elderly and limited individual.  I did discuss this with the patient.  He tells me that his father lived to the age of 22 and that he would like to live this long as well.  For this reason I think it makes sense to consider transcatheter ASD closure especially given the presence of right-sided chamber enlargement signifying hemodynamic effects.    The patient potentially needs right shoulder surgery.  Since he would need dual antiplatelet therapy following ASD closure (which could be interrupted for surgery),  I think it makes sense for him to obtain clear plans about what needs to happen with his shoulder prior to [place] proceeding with transcatheter ASD closure.  He is to see his surgeon on February 13 or 14.  He will contact our office after this.  Additionally he will be seeing Dr. Fletcher Wallace for recommendations regarding his peripheral arterial disease as he does complain of claudication symptoms at times.  I did review his TEE and I think it looks feasible for closure.  I will review these images with the structural imagers as well.      Dispo:  Return in about 6 months (around 03/27/2022).     Medication Adjustments/Labs and Tests Ordered: Current medicines  are reviewed at length with the patient today.  Concerns regarding medicines are outlined above.   Tests Ordered: No orders of the defined types were placed in this encounter.   Medication Changes: No orders of the defined types were placed in this encounter.   History of Present Illness:    FOCUSED CARDIOVASCULAR PROBLEM LIST:   1.  Tobacco abuse 2.  Hypertension 3.  NSVT on monitor October 2022 4.  History of stroke 5.  Arthritis, requiring wheelchair 6.  Atrial secundum ASD  The patient is a 81 y.o. male with the indicated medical history here for follow-up of TEE findings.  He had an echocardiogram when I saw him last time that suggested an atrial level septal defect.  I referred him for a TEE which demonstrated an atrial secundum defect with bidirectional flow.  The rims seem to be reasonable for transcatheter closure.  Additionally he had complained of leg pain and had ABIs which are consistent with obstructive peripheral vascular disease.  He is to see Dr. Fletcher Wallace later this month.  In terms of symptoms the patient denies chest pain, shortness of breath, peripheral edema, paroxysmal nocturnal dyspnea, orthopnea.       Previous Medical History: Past Medical History:  Diagnosis Date   Aneurysm ([place])    per pt,in head   Arthritis    Chicken pox    Depression    Hypercholesteremia  Hypertension    Migraine    PTSD (post-traumatic stress disorder) 06/24/2017   Stroke ([place])    No residual effects   Syphilis    Tuberculosis    Vitamin D deficiency      Current Medications: Current Meds  Medication Sig   aspirin 81 MG EC tablet Take 1 tablet (81 mg total) by mouth daily. Swallow whole.   atorvastatin (LIPITOR) 20 MG tablet Take 1 tablet (20 mg total) by mouth daily at 6 PM.   Cholecalciferol (THERA-D 2000) 50 MCG (2000 UT) TABS 1 tab by mouth once daily   donepezil (ARICEPT) 10 MG tablet Take 1 tablet (10 mg total) by mouth at bedtime.   fexofenadine (ALLEGRA) 180 MG  tablet TAKE ONE TABLET   FLUZONE HIGH-DOSE QUADRIVALENT 0.7 ML SUSY    hydrochlorothiazide (HYDRODIURIL) 25 MG tablet TAKE ONE TABLET BY MOUTH   losartan (COZAAR) 100 MG tablet TAKE ONE TABLET BY MOUTH   memantine (NAMENDA) 5 MG tablet Take 1 tablet (5 mg total) by mouth 2 (two) times daily.   metoprolol succinate (TOPROL XL) 25 MG 24 hr tablet Take 0.5 tablets (12.5 mg total) by mouth at bedtime.   PNEUMOVAX 23 25 MCG/0.5ML injection    sertraline (ZOLOFT) 25 MG tablet Take 1 tablet (25 mg total) by mouth daily.   tadalafil (CIALIS) 5 MG tablet Take 1 tablet (5 mg total) by mouth daily.   vitamin B-12 (CYANOCOBALAMIN) 100 MCG tablet Take 1 tablet (100 mcg total) by mouth daily.     Allergies:    Pollen extract and Amlodipine   Social History:   Social History   Tobacco Use   Smoking status: Every Day    Packs/day: 0.50    Years: 50.00    Pack years: 25.00    Types: Cigarettes   Smokeless tobacco: Never  Vaping Use   Vaping Use: Never used  Substance Use Topics   Alcohol use: Yes    Alcohol/week: 2.0 - 3.0 standard drinks    Types: 2 - 3 Shots of liquor per week    Comment: social, 3 per day   Drug use: No     Family Hx: Family History  Problem Relation Age of Onset   Dementia Mother    Healthy Father    Prostate cancer Brother      Review of Systems:   Please see the history of present illness.    All other systems reviewed and are negative.     EKGs/Labs/Other Test Reviewed:    EKG: Not performed  Prior CV studies:   TTE 11/22  1. Left ventricular ejection fraction, by estimation, is 45 to 50%. The  left ventricle has mildly decreased function. The left ventricle  demonstrates regional wall motion abnormalities. Septal hypokinesis. Left  ventricular diastolic parameters were  normal.   2. Right ventricular systolic function is normal. The right ventricular  size is mildly enlarged. There is normal pulmonary artery systolic  pressure. The estimated  right ventricular systolic pressure is 0000000 mmHg.   3. The mitral valve is normal in structure. No evidence of mitral valve  regurgitation. No evidence of mitral stenosis.   4. The aortic valve is tricuspid. 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 Aortic valve regurgitation is trivial. No aortic  stenosis is present.   5. The inferior vena cava is normal in size with greater than 50%  respiratory variability, suggesting right atrial pressure of 3 mmHg.   6. Evidence of atrial level  shunting detected by color flow Doppler.  Appears to be left to right flow from atria on subcostal images suggesting  ASD, and RV is dilated, recommend further evaluation with either cardiac  MRI, gated CTA, or TEE   TEE 12/22 1. Large Secundum atrial septal defect (ASD) with bidirectional flow, 1.7  x 1.8 cm.   2. Left ventricular ejection fraction, by estimation, is 60 to 65%. The  left ventricle has normal function. The left ventricle has no regional  wall motion abnormalities.   3. Right ventricular systolic function is normal. The right ventricular  size is mildly enlarged. There is normal pulmonary artery systolic  pressure.   4. No left atrial/left atrial appendage thrombus was detected.   5. The mitral valve is normal in structure. Trivial mitral valve  regurgitation. No evidence of mitral stenosis.   6. The aortic valve is tricuspid. Aortic valve regurgitation is trivial.  No aortic stenosis is present.   7. The inferior vena cava is normal in size with greater than 50%  respiratory variability, suggesting right atrial pressure of 3 mmHg.   8. Evidence of atrial level shunting detected by color flow Doppler.  Agitated saline contrast bubble study was positive with shunting observed  within 3-6 cardiac cycles suggestive of interatrial shunt. There is a  large secundum atrial septal defect with   bidirectional shunting across the atrial septum.   Imaging studies that I have  independently reviewed today: TEE and TTE  Recent Labs: 12/12/2020: TSH 1.26 07/09/2021: ALT 17; Magnesium 1.7 08/14/2021: BUN 14; Creatinine, Ser 0.79; Hemoglobin 13.7; Platelets 231; Potassium 4.7; Sodium 140   Recent Lipid Panel Lab Results  Component Value Date/Time   CHOL 184 12/12/2020 02:26 PM   TRIG 58.0 12/12/2020 02:26 PM   HDL 75.10 12/12/2020 02:26 PM   LDLCALC 97 12/12/2020 02:26 PM    Risk Assessment/Calculations:          Physical Exam:    VS:  BP 114/74    Pulse 76    Ht 5\' 9"  (1.753 m)    Wt 124 lb (56.2 kg)    SpO2 99%    BMI 18.31 kg/m    Wt Readings from Last 3 Encounters:  09/27/21 124 lb (56.2 kg)  09/23/21 114 lb (51.7 kg)  08/15/21 124 lb (56.2 kg)    GENERAL:  No apparent distress, Aox3, in wheelchair HEENT:  No carotid bruits, +2 carotid impulses, no scleral icterus CAR: RRR no murmurs, gallops, rubs, or thrills RES:  Clear to auscultation bilaterally ABD:  Soft, nontender, nondistended, positive bowel sounds x 4 VASC:  +2 radial pulses, +2 carotid pulses, palpable pedal pulses NEURO:  CN 2-12 grossly intact; motor and sensory grossly intact PSYCH:  No active depression or anxiety EXT:  No edema, ecchymosis, or cyanosis  Signed, Early Osmond, MD  09/27/2021 11:29 AM    [address], [place], [place]  [address] Phone: [phone]; Fax: [phone]   Note:  This document was prepared using Dragon voice recognition software and may include unintentional dictation errors.

## 2021-09-29 ENCOUNTER — Encounter: Payer: Self-pay | Admitting: Internal Medicine

## 2021-09-29 NOTE — Assessment & Plan Note (Signed)
Pt needs new [employer]round and states current one is 81 yo, states paperwork coming to be filled out

## 2021-09-29 NOTE — Assessment & Plan Note (Signed)
Last vitamin D Lab Results  Component Value Date   VD25OH 21.47 (L) 12/12/2020   Low, to start oral replacement  

## 2021-09-29 NOTE — Assessment & Plan Note (Signed)
Lab Results  Component Value Date   VITAMINB12 152 (L) 12/12/2020   Low, to start oral replacement - b12 1000 mcg qd  

## 2021-09-29 NOTE — Assessment & Plan Note (Signed)
Lab Results  Component Value Date   LDLCALC 97 12/12/2020   Stable, pt to continue current statin lipitor 20  

## 2021-09-29 NOTE — Assessment & Plan Note (Signed)
With hx of PAD - can only walk about 5 ft per pt to get ADLs done

## 2021-09-29 NOTE — Assessment & Plan Note (Signed)
BP Readings from Last 3 Encounters:  09/27/21 114/74  09/23/21 120/82  08/15/21 (!) 133/96   Stable, pt to continue medical treatment hct, losartan, lopressor

## 2021-09-29 NOTE — Assessment & Plan Note (Signed)
Lab Results  Component Value Date   HGBA1C 5.6 06/03/2021   Stable, pt to continue current medical treatment  - diet

## 2021-10-07 ENCOUNTER — Ambulatory Visit: Payer: Medicare Other | Admitting: Internal Medicine

## 2021-10-09 ENCOUNTER — Telehealth: Payer: Self-pay

## 2021-10-09 DIAGNOSIS — Q211 Atrial septal defect, unspecified: Secondary | ICD-10-CM

## 2021-10-09 NOTE — Telephone Encounter (Signed)
Called the patient for an update as he had his orthopedic appointment this week. He states they are going to schedule him for an MRI of his shoulder before making a clear plan and that appointment will not be for several weeks.  At this time, the patient wishes to proceed with ASD closure and then "deal with the shoulder."  Informed the patient Dr. Lynnette Caffey will be consulted and he will be called back. If Dr. Lynnette Caffey agrees to proceed, will arrange pre-procedure visit with Structural APP and arrange closure on 10/24/2021.

## 2021-10-14 NOTE — Telephone Encounter (Addendum)
Discussed with Dr. Lynnette Caffey. The patient will need cCT prior to arranging closure to better assess anatomy. Scheduled the patient for cCT 2/28.  The patient has an appointment with Dr. Kirke Corin tomorrow - spoke with Dr. Jari Sportsman nurse. She will give the patient cCT instruction letter and send to the lab. The patient was grateful for call and agrees with plan.

## 2021-10-15 ENCOUNTER — Other Ambulatory Visit: Payer: Self-pay

## 2021-10-15 ENCOUNTER — Encounter: Payer: Self-pay | Admitting: Cardiovascular Disease

## 2021-10-15 ENCOUNTER — Ambulatory Visit (INDEPENDENT_AMBULATORY_CARE_PROVIDER_SITE_OTHER): Payer: Medicare Other | Admitting: Cardiovascular Disease

## 2021-10-15 ENCOUNTER — Other Ambulatory Visit: Payer: Self-pay | Admitting: *Deleted

## 2021-10-15 VITALS — BP 117/78 | HR 77 | Ht 69.0 in | Wt 124.0 lb

## 2021-10-15 DIAGNOSIS — Q211 Atrial septal defect, unspecified: Secondary | ICD-10-CM

## 2021-10-15 DIAGNOSIS — I10 Essential (primary) hypertension: Secondary | ICD-10-CM

## 2021-10-15 DIAGNOSIS — I739 Peripheral vascular disease, unspecified: Secondary | ICD-10-CM | POA: Diagnosis not present

## 2021-10-15 DIAGNOSIS — E7800 Pure hypercholesterolemia, unspecified: Secondary | ICD-10-CM

## 2021-10-15 NOTE — Progress Notes (Signed)
Cardiology Office Note   Date:  10/17/2021   ID:  Roberto Wallace., DOB 18-Oct-1940, MRN [id number]  PCP:  [name], MD  Cardiologist:  Dr. Ali Lowe  No chief complaint on file.     History of Present Illness: Roberto Wallace. is a 81 y.o. male who was referred by Roberto Wallace for evaluation and management of peripheral arterial disease.  He has severe arthritis with reduced functional capacity and he uses a wheelchair most of the time.  He has chronic medical conditions that include hyperlipidemia, essential hypertension, PTSD, previous stroke and vitamin D deficiency.  He complains of tingling sensation in both legs but no significant calf claudication as he does not do much physical activities.  He has no lower extremity ulceration.  He denies chest pain.  He underwent noninvasive vascular studies in January which showed an ABI of 0.72 on the right and 0.79 on the left.  Toe pressure was absent.  Duplex showed likely an occluded right popliteal artery and occluded distal left SFA.  Past Medical History:  Diagnosis Date   Aneurysm ([place])    per pt,in head   Arthritis    Chicken pox    Depression    Hypercholesteremia    Hypertension    Migraine    PTSD (post-traumatic stress disorder) 06/24/2017   Stroke ([place])    No residual effects   Syphilis    Tuberculosis    Vitamin D deficiency     Past Surgical History:  Procedure Laterality Date   BUBBLE STUDY  08/15/2021   Procedure: BUBBLE STUDY;  Surgeon: Jerline Pain, MD;  Location: [facility];  Service: Cardiovascular;;   COLONOSCOPY     HERNIA REPAIR Right    x 2   TEE WITHOUT CARDIOVERSION N/A 08/15/2021   Procedure: TRANSESOPHAGEAL ECHOCARDIOGRAM (TEE);  Surgeon: Jerline Pain, MD;  Location: [facility] ENDOSCOPY;  Service: Cardiovascular;  Laterality: N/A;     Current Outpatient Medications  Medication Sig Dispense Refill   aspirin 81 MG EC tablet Take 1 tablet (81 mg total) by mouth daily.  Swallow whole. 30 tablet 12   atorvastatin (LIPITOR) 20 MG tablet Take 1 tablet (20 mg total) by mouth daily at 6 PM. 90 tablet 3   Cholecalciferol (THERA-D 2000) 50 MCG (2000 UT) TABS 1 tab by mouth once daily 90 tablet 99   donepezil (ARICEPT) 10 MG tablet Take 1 tablet (10 mg total) by mouth at bedtime. 90 tablet 3   memantine (NAMENDA) 5 MG tablet Take 1 tablet (5 mg total) by mouth 2 (two) times daily. 180 tablet 3   metoprolol succinate (TOPROL XL) 25 MG 24 hr tablet Take 0.5 tablets (12.5 mg total) by mouth at bedtime. 45 tablet 3   PNEUMOVAX 23 25 MCG/0.5ML injection      sertraline (ZOLOFT) 25 MG tablet Take 1 tablet (25 mg total) by mouth daily. 90 tablet 3   tadalafil (CIALIS) 5 MG tablet Take 1 tablet (5 mg total) by mouth daily. 30 tablet 11   vitamin B-12 (CYANOCOBALAMIN) 100 MCG tablet Take 1 tablet (100 mcg total) by mouth daily. 90 tablet 3   fexofenadine (ALLEGRA) 180 MG tablet TAKE ONE TABLET (Patient not taking: Reported on 10/15/2021)     FLUZONE HIGH-DOSE QUADRIVALENT 0.7 ML SUSY  (Patient not taking: Reported on 10/15/2021)     hydrochlorothiazide (HYDRODIURIL) 25 MG tablet TAKE ONE TABLET BY MOUTH (Patient not taking: Reported on 10/15/2021)     losartan (  COZAAR) 100 MG tablet TAKE ONE TABLET BY MOUTH (Patient not taking: Reported on 10/15/2021)     No current facility-administered medications for this visit.    Allergies:   Pollen extract and Amlodipine    Social History:  The patient  reports that he has been smoking cigarettes. He has a 25.00 pack-year smoking history. He has never used smokeless tobacco. He reports current alcohol use of about 2.0 - 3.0 standard drinks per week. He reports that he does not use drugs.   Family History:  The patient's family history includes Dementia in his mother; Healthy in his father; Prostate cancer in his brother.    ROS:  Please see the history of present illness.   Otherwise, review of systems are positive for none.   All other  systems are reviewed and negative.    PHYSICAL EXAM: VS:  BP 117/78    Pulse 77    Ht 5\' 9"  (1.753 m)    Wt 124 lb (56.2 kg)    SpO2 99%    BMI 18.31 kg/m  , BMI Body mass index is 18.31 kg/m. GEN: Well nourished, well developed, in no acute distress  HEENT: normal  Neck: no JVD, carotid bruits, or masses Cardiac: RRR; no murmurs, rubs, or gallops,no edema  Respiratory:  clear to auscultation bilaterally, normal work of breathing GI: soft, nontender, nondistended, + BS MS: no deformity or atrophy  Skin: warm and dry, no rash Neuro:  Strength and sensation are intact Psych: euthymic mood, full affect   EKG:  EKG is not ordered today.    Recent Labs: 12/12/2020: TSH 1.26 07/09/2021: ALT 17; Magnesium 1.7 08/14/2021: Hemoglobin 13.7; Platelets 231 10/15/2021: BUN 12; Creatinine, Ser 0.91; Potassium 4.0; Sodium 138    Lipid Panel    Component Value Date/Time   CHOL 184 12/12/2020 1426   TRIG 58.0 12/12/2020 1426   HDL 75.10 12/12/2020 1426   CHOLHDL 2 12/12/2020 1426   VLDL 11.6 12/12/2020 1426   LDLCALC 97 12/12/2020 1426      Wt Readings from Last 3 Encounters:  10/15/21 124 lb (56.2 kg)  09/27/21 124 lb (56.2 kg)  09/23/21 114 lb (51.7 kg)      No flowsheet data found.    ASSESSMENT AND PLAN:  1.  Peripheral arterial disease: The patient has evidence of peripheral arterial disease with bilateral SFA popliteal disease and possible occlusion with moderately reduced ABI.  However, he has no lifestyle limiting claudication likely due to reduced physical capacity related to his severe arthritis as he is mostly wheelchair-bound.  In addition, he has no evidence of critical limb ischemia.  As a result, revascularization is not indicated.  Recommend continuing medical therapy with low-dose aspirin and treatment of risk factors.  2.  Hyperlipidemia: Continue treatment with atorvastatin.  Recommend a target LDL of less than 70.  3.  Essential hypertension: Blood pressure  is well controlled on medications.  4.  ASD: He is getting work-up for ASD closure.    Disposition:   FU with me in 1 year  Signed,  Kathlyn Sacramento, MD  10/17/2021 10:51 AM    [place]

## 2021-10-15 NOTE — Patient Instructions (Signed)
Medication Instructions:  °No changes °*If you need a refill on your cardiac medications before your next appointment, please call your pharmacy* ° ° °Lab Work: °None ordered °If you have labs (blood work) drawn today and your tests are completely normal, you will receive your results only by: °MyChart Message (if you have MyChart) OR °A paper copy in the mail °If you have any lab test that is abnormal or we need to change your treatment, we will call you to review the results. ° ° °Testing/Procedures: °None ordered ° ° °Follow-Up: °At [facility], you and your health needs are our priority.  As part of our continuing mission to provide you with exceptional heart care, we have created designated Provider Care Teams.  These Care Teams include your primary Cardiologist (physician) and Advanced Practice Providers (APPs -  Physician Assistants and Nurse Practitioners) who all work together to provide you with the care you need, when you need it. ° °We recommend signing up for the patient portal called "MyChart".  Sign up information is provided on this After Visit Summary.  MyChart is used to connect with patients for Virtual Visits (Telemedicine).  Patients are able to view lab/test results, encounter notes, upcoming appointments, etc.  Non-urgent messages can be sent to your provider as well.   °To learn more about what you can do with MyChart, go to [email or web address].   ° °Your next appointment:   °12 month(s) ° °The format for your next appointment:   °In Person ° °Provider:   °Dr. Arida ° °

## 2021-10-16 LAB — BASIC METABOLIC PANEL
BUN/Creatinine Ratio: 13 (ref 10–24)
BUN: 12 mg/dL (ref 8–27)
CO2: 27 mmol/L (ref 20–29)
Calcium: 9.3 mg/dL (ref 8.6–10.2)
Chloride: 100 mmol/L (ref 96–106)
Creatinine, Ser: 0.91 mg/dL (ref 0.76–1.27)
Glucose: 73 mg/dL (ref 70–99)
Potassium: 4 mmol/L (ref 3.5–5.2)
Sodium: 138 mmol/L (ref 134–144)
eGFR: 86 mL/min/{1.73_m2} (ref >59)

## 2021-10-21 ENCOUNTER — Telehealth (HOSPITAL_COMMUNITY): Payer: Self-pay | Admitting: Emergency Medicine

## 2021-10-21 NOTE — Telephone Encounter (Signed)
Attempted to call patient regarding upcoming cardiac CT appointment. °Left message on voicemail with name and callback number °Zyair Rhein RN Navigator Cardiac Imaging °[facility] °[phone] Office °[phone] Cell ° °

## 2021-10-22 ENCOUNTER — Other Ambulatory Visit: Payer: Self-pay

## 2021-10-22 ENCOUNTER — Ambulatory Visit (HOSPITAL_COMMUNITY)
Admission: RE | Admit: 2021-10-22 | Discharge: 2021-10-22 | Disposition: A | Discharge status: Home / Self Care | Payer: Medicare Other | Source: Ambulatory Visit | Attending: Internal Medicine | Admitting: Internal Medicine

## 2021-10-22 ENCOUNTER — Ambulatory Visit (HOSPITAL_COMMUNITY): Payer: Medicare Other

## 2021-10-22 DIAGNOSIS — I2510 Atherosclerotic heart disease of native coronary artery without angina pectoris: Secondary | ICD-10-CM | POA: Diagnosis not present

## 2021-10-22 DIAGNOSIS — Q211 Atrial septal defect, unspecified: Secondary | ICD-10-CM

## 2021-10-22 MED ORDER — NITROGLYCERIN 0.4 MG SL SUBL
SUBLINGUAL_TABLET | SUBLINGUAL | Status: AC
  Filled 2021-10-22: qty 2

## 2021-10-22 MED ORDER — IOHEXOL 350 MG/ML SOLN
100.0000 mL | Freq: Once | INTRAVENOUS | Status: AC | PRN
  Administered 2021-10-22: 100 mL via INTRAVENOUS

## 2021-10-22 MED ORDER — METOPROLOL TARTRATE 5 MG/5ML IV SOLN
INTRAVENOUS | Status: AC
  Filled 2021-10-22: qty 5

## 2021-10-22 MED ORDER — NITROGLYCERIN 0.4 MG SL SUBL
0.8000 mg | SUBLINGUAL_TABLET | Freq: Once | SUBLINGUAL | Status: AC
  Administered 2021-10-22: 0.8 mg via SUBLINGUAL

## 2021-10-22 MED ORDER — METOPROLOL TARTRATE 5 MG/5ML IV SOLN
10.0000 mg | INTRAVENOUS | Status: DC | PRN
  Administered 2021-10-22: 10 mg via INTRAVENOUS

## 2021-10-23 ENCOUNTER — Telehealth: Payer: Self-pay

## 2021-10-23 NOTE — Telephone Encounter (Signed)
[employer] Rep calling to check on the order that was fax yesterday 2/28.  ? ?The form was printed and place in Dr. Jenny Reichmann box 3/1 11:23 ? ?FYI ?

## 2021-10-25 NOTE — Telephone Encounter (Signed)
Faxed back order, received confirmation. ?

## 2021-10-29 NOTE — Addendum Note (Signed)
Addended by: Gunnar Fusi A on: 10/29/2021 04:55 PM ? ? Modules accepted: Orders ? ?

## 2021-10-29 NOTE — Telephone Encounter (Signed)
Scheduled the patient for PFTs 11/28/2021 and Dr. Ali Lowe 12/06/2021. ?Will send letter to the patient with appointment dates/times/addresses. ?He was grateful for call and agrees with plan.  ?

## 2021-10-30 ENCOUNTER — Other Ambulatory Visit: Payer: Self-pay

## 2021-10-30 ENCOUNTER — Ambulatory Visit (INDEPENDENT_AMBULATORY_CARE_PROVIDER_SITE_OTHER): Payer: Medicare Other | Admitting: Internal Medicine

## 2021-10-30 ENCOUNTER — Encounter: Payer: Self-pay | Admitting: Internal Medicine

## 2021-10-30 VITALS — BP 120/68 | HR 76 | Ht 69.0 in | Wt 117.0 lb

## 2021-10-30 DIAGNOSIS — I10 Essential (primary) hypertension: Secondary | ICD-10-CM | POA: Diagnosis not present

## 2021-10-30 DIAGNOSIS — Z5900 Homelessness unspecified: Secondary | ICD-10-CM | POA: Diagnosis not present

## 2021-10-30 DIAGNOSIS — E538 Deficiency of other specified B group vitamins: Secondary | ICD-10-CM | POA: Diagnosis not present

## 2021-10-30 DIAGNOSIS — F17200 Nicotine dependence, unspecified, uncomplicated: Secondary | ICD-10-CM

## 2021-10-30 DIAGNOSIS — E559 Vitamin D deficiency, unspecified: Secondary | ICD-10-CM | POA: Diagnosis not present

## 2021-10-30 DIAGNOSIS — Z0001 Encounter for general adult medical examination with abnormal findings: Secondary | ICD-10-CM | POA: Diagnosis not present

## 2021-10-30 DIAGNOSIS — E7800 Pure hypercholesterolemia, unspecified: Secondary | ICD-10-CM

## 2021-10-30 DIAGNOSIS — R739 Hyperglycemia, unspecified: Secondary | ICD-10-CM | POA: Diagnosis not present

## 2021-10-30 MED ORDER — ATORVASTATIN CALCIUM 40 MG PO TABS: 40.0000 mg | ORAL_TABLET | Freq: Every day | ORAL | 3 refills | Status: DC

## 2021-10-30 NOTE — Progress Notes (Signed)
Patient ID: Roberto Felt., male   DOB: 28-Apr-1941, 81 y.o.   MRN: [id number]         Chief Complaint:: wellness exam and Follow-up         HPI:  Roberto Weckesser. is a 81 y.o. male here for wellness exam; [facility] O/w up to date.  Plans to move in to new apartment in about  2 wks, Has been living transitional housing per [place] for 8 mo.  O/w up to date                        Also for MRI  [facility], followed closely by cardiology, and now being consdiered  for cardiac surgury soon.  Pt understands he has been told he has a pulmonary malignancy, though I dont see this on the feb 28 CT report.  Also has glaucoma both eyes recent onset, good med compliance and has planned f/u with optho.  Stopped smoking yesterday, and now does not plan to restart after so much bad news recently and floored him. TakingVit D and B12 for the last 6 month.  Pt denies chest pain, increased sob or doe, wheezing, orthopnea, PND, increased LE swelling, palpitations, dizziness or syncope.   Pt denies polydipsia, polyuria, or new focal neuro s/s.    Pt denies fever, wt loss, night sweats, loss of appetite, or other constitutional symptoms  No other new complaints    Wt Readings from Last 3 Encounters:  10/30/21 117 lb (53.1 kg)  10/15/21 124 lb (56.2 kg)  09/27/21 124 lb (56.2 kg)   BP Readings from Last 3 Encounters:  10/30/21 120/68  10/22/21 92/70  10/15/21 117/78   Immunization History  Administered Date(s) Administered   Influenza, High Dose Seasonal PF 04/28/2017, 05/27/2018   Influenza-Unspecified 06/25/2002, 08/06/2016, 05/27/2018, 05/27/2021   Janssen (J&J) SARS-COV-2 Vaccination 12/02/2019   Moderna Sars-Covid-2 Vaccination 02/28/2021   PFIZER(Purple Top)SARS-COV-2 Vaccination 06/11/2021   Pneumococcal Conjugate-13 10/12/2013, 04/16/2015, 08/06/2016   Pneumococcal Polysaccharide-23 07/04/2009, 04/28/2017   Td 04/16/2015   Tdap 11/18/2011   Zoster  Recombinat (Shingrix) 12/26/2019, 02/23/2020   Zoster, Live 05/19/2015  There are no preventive care reminders to display for this patient.    Past Medical History:  Diagnosis Date   Aneurysm ([place])    per pt,in head   Arthritis    Chicken pox    Depression    Hypercholesteremia    Hypertension    Migraine    PTSD (post-traumatic stress disorder) 06/24/2017   Stroke ([place])    No residual effects   Syphilis    Tuberculosis    Vitamin D deficiency    Past Surgical History:  Procedure Laterality Date   BUBBLE STUDY  08/15/2021   Procedure: BUBBLE STUDY;  Surgeon: Jerline Pain, MD;  Location: Home;  Service: Cardiovascular;;   COLONOSCOPY     HERNIA REPAIR Right    x 2   TEE WITHOUT CARDIOVERSION N/A 08/15/2021   Procedure: TRANSESOPHAGEAL ECHOCARDIOGRAM (TEE);  Surgeon: Jerline Pain, MD;  Location: [facility] ENDOSCOPY;  Service: Cardiovascular;  Laterality: N/A;    reports that he quit smoking 5 days ago. His smoking use included cigarettes. He has a 25.00 pack-year smoking history. He has never used smokeless tobacco. He reports current alcohol use of about 2.0 - 3.0 standard drinks per week. He reports that he does not use drugs. family  history includes Dementia in his mother; Healthy in his father; Prostate cancer in his brother. Allergies  Allergen Reactions   Pollen Extract     Other reaction(s): Sinusitis   Amlodipine Rash   Current Outpatient Medications on File Prior to Visit  Medication Sig Dispense Refill   aspirin 81 MG EC tablet Take 1 tablet (81 mg total) by mouth daily. Swallow whole. 30 tablet 12   Cholecalciferol (THERA-D 2000) 50 MCG (2000 UT) TABS 1 tab by mouth once daily 90 tablet 99   donepezil (ARICEPT) 10 MG tablet Take 1 tablet (10 mg total) by mouth at bedtime. 90 tablet 3   memantine (NAMENDA) 5 MG tablet Take 1 tablet (5 mg total) by mouth 2 (two) times daily. 180 tablet 3   metoprolol succinate (TOPROL XL) 25 MG 24 hr tablet Take 0.5 tablets  (12.5 mg total) by mouth at bedtime. 45 tablet 3   sertraline (ZOLOFT) 25 MG tablet Take 1 tablet (25 mg total) by mouth daily. 90 tablet 3   tadalafil (CIALIS) 5 MG tablet Take 1 tablet (5 mg total) by mouth daily. 30 tablet 11   vitamin B-12 (CYANOCOBALAMIN) 100 MCG tablet Take 1 tablet (100 mcg total) by mouth daily. 90 tablet 3   fexofenadine (ALLEGRA) 180 MG tablet TAKE ONE TABLET (Patient not taking: Reported on 10/15/2021)     hydrochlorothiazide (HYDRODIURIL) 25 MG tablet TAKE ONE TABLET BY MOUTH (Patient not taking: Reported on 10/15/2021)     losartan (COZAAR) 100 MG tablet TAKE ONE TABLET BY MOUTH (Patient not taking: Reported on 10/15/2021)     No current facility-administered medications on file prior to visit.        ROS:  All others reviewed and negative.  Objective        PE:  BP 120/68 (BP Location: Left Arm, Patient Position: Sitting, Cuff Size: Normal)    Pulse 76    Ht 5\' 9"  (1.753 m)    Wt 117 lb (53.1 kg)    SpO2 96%    BMI 17.28 kg/m                 Constitutional: Pt appears in NAD               HENT: Head: NCAT.                Right Ear: External ear normal.                 Left Ear: External ear normal.                Eyes: . Pupils are equal, round, and reactive to light. Conjunctivae and EOM are normal               Nose: without d/c or deformity               Neck: Neck supple. Gross normal ROM               Cardiovascular: Normal rate and regular rhythm.                 Pulmonary/Chest: Effort normal and breath sounds without rales or wheezing.                Abd:  Soft, NT, ND, + BS, no organomegaly               Neurological: Pt is alert. At baseline orientation, motor grossly intact  Skin: Skin is warm. No rashes, no other new lesions, LE edema - none               Psychiatric: Pt behavior is normal without agitation   Micro: none  Cardiac tracings I have personally interpreted today:  none  Pertinent Radiological findings (summarize):  none   Lab Results  Component Value Date   WBC 6.5 08/14/2021   HGB 13.7 08/14/2021   HCT 40.5 08/14/2021   PLT 231 08/14/2021   GLUCOSE 73 10/15/2021   CHOL 184 12/12/2020   TRIG 58.0 12/12/2020   HDL 75.10 12/12/2020   LDLCALC 97 12/12/2020   ALT 17 07/09/2021   AST 18 07/09/2021   NA 138 10/15/2021   K 4.0 10/15/2021   CL 100 10/15/2021   CREATININE 0.91 10/15/2021   BUN 12 10/15/2021   CO2 27 10/15/2021   TSH 1.26 12/12/2020   PSA 2.77 12/12/2020   INR 0.99 10/16/2017   HGBA1C 5.6 06/03/2021   Assessment/Plan:  Roberto PAYANT [name] is a 81 y.o. Black or African American [2] male with  has a past medical history of Aneurysm ([place]), Arthritis, Chicken pox, Depression, Hypercholesteremia, Hypertension, Migraine, PTSD (post-traumatic stress disorder) (06/24/2017), Stroke ([place]), Syphilis, Tuberculosis, and Vitamin D deficiency.  Vitamin D deficiency Last vitamin D Lab Results  Component Value Date   VD25OH 21.47 (L) 12/12/2020   Low, to start oral replacement   B12 deficiency Lab Results  Component Value Date   VITAMINB12 152 (L) 12/12/2020   Low, to start oral replacement - b12 1000 mcg qd  Encounter for well adult exam with abnormal findings Age and sex appropriate education and counseling updated with regular exercise and diet Referrals for preventative services - none needed Immunizations addressed - none needed Smoking counseling  - states he quit smoking yesterday, pt encouraged to continue to abstain Evidence for depression or other mood disorder - none significant Most recent labs reviewed. I have personally reviewed and have noted: 1) the patient's medical and social history 2) The patient's current medications and supplements 3) The patient's height, weight, and BMI have been recorded in the chart   TOBACCO USER Pt quit yesterday, pt to continue to abstain  HYPERTENSION, BENIGN ESSENTIAL BP Readings from Last 3 Encounters:  10/30/21 120/68   10/22/21 92/70  10/15/21 117/78   Stable, pt to continue medical treatment toprol, losartan, hct   Hyperlipidemia Lab Results  Component Value Date   LDLCALC 97 12/12/2020   Uncontrolled, goal ldl < 70, pt to increase current statin lipitor to 40   Hyperglycemia Lab Results  Component Value Date   HGBA1C 5.6 06/03/2021   Stable, pt to continue current medical treatment  - diet   Homelessness Currently living in [place] transitional home setting doing well per pt  Followup: Return in about 6 months (around 05/02/2022).  Cathlean Cower, MD 11/03/2021 3:32 PM [facility]

## 2021-10-30 NOTE — Assessment & Plan Note (Signed)
Last vitamin D Lab Results  Component Value Date   VD25OH 21.47 (L) 12/12/2020   Low, to start oral replacement  

## 2021-10-30 NOTE — Patient Instructions (Addendum)
Ok to increase the lipitor to 40 mg per day ? ?Please continue to quit smoking as you have started yesterday ? ?Please take OTC Vitamin D3 at 2000 units per day, indefinitely ? ?Please also take the B12 1000 mcg per day ? ?Please continue all other medications as before, and refills have been done if requested. ? ?Please have the pharmacy call with any other refills you may need. ? ?Please continue your efforts at being more active, low cholesterol diet, and weight control. ? ?You are otherwise up to date with prevention measures today. ? ?Please keep your appointments with your specialists as you may have planned ? ?Please go to the LAB at the blood drawing area for the tests to be done ? ?You will be contacted by phone if any changes need to be made immediately.  Otherwise, you will receive a letter about your results with an explanation, but please check with MyChart first. ? ?Please remember to sign up for MyChart if you have not done so, as this will be important to you in the future with finding out test results, communicating by private email, and scheduling acute appointments online when needed. ? ?Please make an Appointment to return in 6 months, or sooner if needed ? ?Daisey Must with your Surgury! ? ? ?

## 2021-10-30 NOTE — Assessment & Plan Note (Signed)
Lab Results  Component Value Date   VITAMINB12 152 (L) 12/12/2020   Low, to start oral replacement - b12 1000 mcg qd  

## 2021-11-03 ENCOUNTER — Encounter: Payer: Self-pay | Admitting: Internal Medicine

## 2021-11-03 NOTE — Assessment & Plan Note (Signed)
Currently living in [place] transitional home setting doing well per pt ?

## 2021-11-03 NOTE — Assessment & Plan Note (Signed)
Age and sex appropriate education and counseling updated with regular exercise and diet ?Referrals for preventative services - none needed ?Immunizations addressed - none needed ?Smoking counseling  - states he quit smoking yesterday, pt encouraged to continue to abstain ?Evidence for depression or other mood disorder - none significant ?Most recent labs reviewed. ?I have personally reviewed and have noted: ?1) the patient's medical and social history ?2) The patient's current medications and supplements ?3) The patient's height, weight, and BMI have been recorded in the chart ? ?

## 2021-11-03 NOTE — Assessment & Plan Note (Signed)
Lab Results  ?Component Value Date  ? HGBA1C 5.6 06/03/2021  ? ?Stable, pt to continue current medical treatment  - diet ? ?

## 2021-11-03 NOTE — Assessment & Plan Note (Signed)
BP Readings from Last 3 Encounters:  ?10/30/21 120/68  ?10/22/21 92/70  ?10/15/21 117/78  ? ?Stable, pt to continue medical treatment toprol, losartan, hct ? ?

## 2021-11-03 NOTE — Assessment & Plan Note (Signed)
Pt quit yesterday, pt to continue to abstain ?

## 2021-11-03 NOTE — Assessment & Plan Note (Addendum)
Lab Results  ?Component Value Date  ? LDLCALC 97 12/12/2020  ? ?Uncontrolled, goal ldl < 70, pt to increase current statin lipitor to 40 ? ?

## 2021-11-28 ENCOUNTER — Ambulatory Visit (INDEPENDENT_AMBULATORY_CARE_PROVIDER_SITE_OTHER): Payer: Medicare Other | Admitting: Internal Medicine

## 2021-11-28 DIAGNOSIS — Q211 Atrial septal defect, unspecified: Secondary | ICD-10-CM | POA: Diagnosis not present

## 2021-11-28 LAB — PULMONARY FUNCTION TEST
DL/VA % pred: 100 %
DL/VA: 4.12 ml/min/mmHg/L
DLCO cor % pred: 114 %
DLCO cor: 19.49 ml/min/mmHg
DLCO unc % pred: 114 %
DLCO unc: 19.49 ml/min/mmHg
FEF 25-75 Post: 1.84 L/sec
FEF 25-75 Pre: 1.1 L/sec
FEF2575-%Change-Post: 68 %
FEF2575-%Pred-Post: 154 %
FEF2575-%Pred-Pre: 91 %
FEV1-%Change-Post: 15 %
FEV1-%Pred-Post: 148 %
FEV1-%Pred-Pre: 129 %
FEV1-Post: 2.23 L
FEV1-Pre: 1.94 L
FEV1FVC-%Change-Post: 10 %
FEV1FVC-%Pred-Pre: 87 %
FEV6-%Change-Post: 8 %
FEV6-%Pred-Post: 155 %
FEV6-%Pred-Pre: 143 %
FEV6-Post: 3.09 L
FEV6-Pre: 2.86 L
FEV6FVC-%Pred-Post: 109 %
FEV6FVC-%Pred-Pre: 109 %
FVC-%Change-Post: 4 %
FVC-%Pred-Post: 142 %
FVC-%Pred-Pre: 136 %
FVC-Post: 3.09 L
FVC-Pre: 2.96 L
Post FEV1/FVC ratio: 72 %
Post FEV6/FVC ratio: 100 %
Pre FEV1/FVC ratio: 65 %
Pre FEV6/FVC Ratio: 100 %

## 2021-11-28 NOTE — Patient Instructions (Signed)
Full PFT performed today. °

## 2021-11-28 NOTE — Progress Notes (Signed)
Full PFT performed today. °

## 2021-12-06 ENCOUNTER — Ambulatory Visit: Payer: Medicare Other | Admitting: Internal Medicine

## 2021-12-09 NOTE — Progress Notes (Signed)
?Cardiology Office Note:   ? ?Date:  12/10/2021  ? ?ID:  Roberto Gourd [name], DOB [birth year], MRN [id number] ? ?PCP:  [name], MD  ? ?[facility] Providers ?Cardiologist:  [name], MD ?Referring MD: [name], MD  ? ?Chief Complaint/Reason for Referral:  Cardiology follow up ? ?ASSESSMENT:   ? ?1. ASD (atrial septal defect)   ?2. Coronary artery calcification seen on CAT scan   ?3. Infrarenal abdominal aortic aneurysm (AAA) without rupture ([place])   ?4. Essential hypertension   ?5. PAD (peripheral artery disease) ([place])   ?6. Tobacco abuse   ?7. Hyperlipidemia LDL goal <70   ? ? ?PLAN:   ? ?In order of problems listed above: ?Primum ASD: Unable to closed with transcatheter techniques.  I spoke with the patient about his options.  I do not think cardiac surgery at his age is worthwhile.  He agrees.  We will continue to manage this medically.  Follow-up in 1 year or earlier if needed. ?Coronary artery calcification: Continue aspirin, statin, and blood pressure control. ?AAA: We will obtain abdominal ultrasound to evaluate for AAA.  His last ultrasound was in 2017.  After speaking with patient he says he will obtain this at the [place].  We will reach out to his [place] doctor about this. ?Hypertension: Blood pressures well controlled on his current regimen. ?PAD: Continue aspirin, statin, and blood pressure control ?Tobacco abuse: Patient is not ready to quit smoking but he tells me he is cutting down ?Hyperlipidemia: We will check lipid panel and LFTs at next visit. ? ? ?Dispo:  Return in about 1 year (around 12/11/2022).  ? ?  ? ?Medication Adjustments/Labs and Tests Ordered: ?Current medicines are reviewed at length with the patient today.  Concerns regarding medicines are outlined above. ? ?The following changes have been made:    ? ?Labs/tests ordered: ?No orders of the defined types were placed in this encounter. ? ? ?Medication Changes: ?No orders of the defined types were placed in this  encounter. ? ? ? ?Current medicines are reviewed at length with the patient today.  The patient does not have concerns regarding medicines. ? ? ?History of Present Illness:   ? ?FOCUSED PROBLEM LIST:   ?1.  Tobacco abuse ?2.  Hypertension ?3.  NSVT on monitor October 2022 ?4.  History of stroke ?5.  Arthritis, requiring wheelchair ?6.  Ostium primum ASD ?7.  Peripheral vascular disease followed by Dr. [name] ?8.  Coronary artery calcification ?9.  Infrarenal abdominal aortic aneurysm ? ?The patient is a 81 y.o. male with the indicated medical history here for routine follow-up.  He underwent a TEE which demonstrated ostium premium defect that is not closable by transcatheter techniques.  He is otherwise doing well.  He denies any shortness of breath, chest pain, peripheral edema, paroxysmal nocturnal dyspnea.  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  He is required no emergency room visits or hospitalizations.  He has had no bad bleeding or bruising episodes.     ? ? ? ?Current Medications: ?Current Meds  ?Medication Sig  ? aspirin 81 MG EC tablet Take 1 tablet (81 mg total) by mouth daily. Swallow whole.  ? atorvastatin (LIPITOR) 40 MG tablet Take 1 tablet (40 mg total) by mouth daily.  ? Cholecalciferol (THERA-D 2000) 50 MCG (2000 UT) TABS 1 tab by mouth once daily  ? donepezil (ARICEPT) 10 MG tablet Take 1 tablet (10 mg total) by mouth at  bedtime.  ? fexofenadine (ALLEGRA) 180 MG tablet   ? hydrochlorothiazide (HYDRODIURIL) 25 MG tablet   ? losartan (COZAAR) 100 MG tablet   ? memantine (NAMENDA) 5 MG tablet Take 1 tablet (5 mg total) by mouth 2 (two) times daily.  ? metoprolol succinate (TOPROL XL) 25 MG 24 hr tablet Take 0.5 tablets (12.5 mg total) by mouth at bedtime.  ? sertraline (ZOLOFT) 25 MG tablet Take 1 tablet (25 mg total) by mouth daily.  ? tadalafil (CIALIS) 5 MG tablet Take 1 tablet (5 mg total) by mouth daily.  ? vitamin B-12 (CYANOCOBALAMIN) 100 MCG tablet Take  1 tablet (100 mcg total) by mouth daily.  ?  ? ?Allergies:    ?Pollen extract and Amlodipine  ? ?Social History:   ?Social History  ? ?Tobacco Use  ? Smoking status: Former  ?  Packs/day: 0.50  ?  Years: 50.00  ?  Pack years: 25.00  ?  Types: Cigarettes  ?  Quit date: 10/29/2021  ?  Years since quitting: 0.1  ? Smokeless tobacco: Never  ?Vaping Use  ? Vaping Use: Never used  ?Substance Use Topics  ? Alcohol use: Yes  ?  Alcohol/week: 2.0 - 3.0 standard drinks  ?  Types: 2 - 3 Shots of liquor per week  ?  Comment: social, 3 per day  ? Drug use: No  ?  ? ?Family Hx: ?Family History  ?Problem Relation Age of Onset  ? Dementia Mother   ? Healthy Father   ? Prostate cancer Brother   ?  ? ?Review of Systems:   ?Please see the history of present illness.    ?All other systems reviewed and are negative. ?  ? ? ?EKGs/Labs/Other Test Reviewed:   ? ?EKG:  EKG not performed today ? ?Prior CV studies: ? ?TEE and TTE with imagers which demonstrates mild right-sided enlargement and primus ASD. ? ?Other studies Reviewed: ?Review of the additional studies/records demonstrates:  ? ?CT abd/pelvis 2017 ?1. Suspect active tuberculosis involving the upper lobes and lingula. A fungal process cannot be excluded. ?2. Infrarenal abdominal aortic aneurysm of 3.3 cm with intraluminalclot terminating just above the bifurcation. Recommend followup by ultrasound in 3 years.  ? ? ?Recent Labs: ?12/12/2020: TSH 1.26 ?07/09/2021: ALT 17; Magnesium 1.7 ?08/14/2021: Hemoglobin 13.7; Platelets 231 ?10/15/2021: BUN 12; Creatinine, Ser 0.91; Potassium 4.0; Sodium 138  ? ?Recent Lipid Panel ?Lab Results  ?Component Value Date/Time  ? CHOL 184 12/12/2020 02:26 PM  ? TRIG 58.0 12/12/2020 02:26 PM  ? HDL 75.10 12/12/2020 02:26 PM  ? [place] 97 12/12/2020 02:26 PM  ? ? ?Risk Assessment/Calculations:   ? ?  ?    ? ?Physical Exam:   ? ?VS:  BP 118/70   Pulse 93   Ht 5\' 9"  (1.753 m)   Wt 121 lb (54.9 kg)   SpO2 98%   BMI 17.87 kg/m?    ?Wt Readings from Last 3  Encounters:  ?12/10/21 121 lb (54.9 kg)  ?10/30/21 117 lb (53.1 kg)  ?10/15/21 124 lb (56.2 kg)  ?  ?GENERAL:  No apparent distress, Aox3, in wheelchair ?HEENT:  No carotid bruits, +2 carotid impulses, no scleral icterus ?CAR: RRR no murmurs, gallops, rubs, or thrills ?RES:  Clear to auscultation bilaterally ?ABD:  Soft, nontender, nondistended, positive bowel sounds x 4 ?VASC:  +2 radial pulses, +2 carotid pulses ?NEURO:  CN 2-12 grossly intact; motor and sensory grossly intact ?PSYCH:  No active depression or anxiety ?EXT:  No edema,  ecchymosis, or cyanosis ? ?Signed, ?Early Osmond, MD  ?12/10/2021 8:55 AM    ?[facility] ?[address], [place], [place]  [address] ?Phone: [phone]; Fax: [phone]  ? ?Note:  This document was prepared using Dragon voice recognition software and may include unintentional dictation errors. ?

## 2021-12-10 ENCOUNTER — Ambulatory Visit (INDEPENDENT_AMBULATORY_CARE_PROVIDER_SITE_OTHER): Payer: Medicare Other | Admitting: Internal Medicine

## 2021-12-10 ENCOUNTER — Encounter: Payer: Self-pay | Admitting: Internal Medicine

## 2021-12-10 VITALS — BP 118/70 | HR 93 | Ht 69.0 in | Wt 121.0 lb

## 2021-12-10 DIAGNOSIS — I2510 Atherosclerotic heart disease of native coronary artery without angina pectoris: Secondary | ICD-10-CM | POA: Diagnosis not present

## 2021-12-10 DIAGNOSIS — I10 Essential (primary) hypertension: Secondary | ICD-10-CM

## 2021-12-10 DIAGNOSIS — Z720 Tobacco use: Secondary | ICD-10-CM

## 2021-12-10 DIAGNOSIS — Q211 Atrial septal defect, unspecified: Secondary | ICD-10-CM | POA: Diagnosis not present

## 2021-12-10 DIAGNOSIS — E785 Hyperlipidemia, unspecified: Secondary | ICD-10-CM | POA: Diagnosis not present

## 2021-12-10 DIAGNOSIS — I7143 Infrarenal abdominal aortic aneurysm, without rupture: Secondary | ICD-10-CM

## 2021-12-10 DIAGNOSIS — I739 Peripheral vascular disease, unspecified: Secondary | ICD-10-CM

## 2021-12-10 NOTE — Patient Instructions (Addendum)
Medication Instructions:  ?No changes ?*If you need a refill on your cardiac medications before your next appointment, please call your pharmacy* ? ? ?Lab Work: ?none ?If you have labs (blood work) drawn today and your tests are completely normal, you will receive your results only by: ?MyChart Message (if you have MyChart) OR ?A paper copy in the mail ?If you have any lab test that is abnormal or we need to change your treatment, we will call you to review the results. ? ? ?Testing/Procedures: ?none ? ? ?Follow-Up: ?At [facility], you and your health needs are our priority.  As part of our continuing mission to provide you with exceptional heart care, we have created designated Provider Care Teams.  These Care Teams include your primary Cardiologist (physician) and Advanced Practice Providers (APPs -  Physician Assistants and Nurse Practitioners) who all work together to provide you with the care you need, when you need it. ? ?Your next appointment:   ?12 month(s) ? ?The format for your next appointment:   ?In Person ? ?Provider:   ?Roberto Osmond, MD   ? ? ?Important Information About Sugar ? ? ? ? ? Addendum:  placed order for AAA Duplex.  The patient said he would like to have this done at the [place] when I spoke with him and he asked me to coordinate this through his PCP office.  I called Dr. Gwynn Burly office and was told they do not do any coordination of his testing.  Will send message to [facility] Scheduler to arrange and to pre cert. ?

## 2021-12-20 NOTE — Addendum Note (Signed)
Addended by: Lendon Ka on: 12/20/2021 02:45 PM ? ? Modules accepted: Orders ? ?

## 2021-12-25 ENCOUNTER — Ambulatory Visit (HOSPITAL_COMMUNITY)
Admission: RE | Admit: 2021-12-25 | Discharge: 2021-12-25 | Disposition: A | Discharge status: Home / Self Care | Payer: Medicare Other | Source: Ambulatory Visit | Attending: Cardiovascular Disease | Admitting: Cardiovascular Disease

## 2021-12-25 DIAGNOSIS — I7143 Infrarenal abdominal aortic aneurysm, without rupture: Secondary | ICD-10-CM | POA: Insufficient documentation

## 2021-12-25 DIAGNOSIS — Z720 Tobacco use: Secondary | ICD-10-CM | POA: Insufficient documentation

## 2021-12-31 DIAGNOSIS — M79606 Pain in leg, unspecified: Secondary | ICD-10-CM | POA: Diagnosis not present

## 2021-12-31 DIAGNOSIS — I69898 Other sequelae of other cerebrovascular disease: Secondary | ICD-10-CM | POA: Diagnosis not present

## 2021-12-31 DIAGNOSIS — I739 Peripheral vascular disease, unspecified: Secondary | ICD-10-CM | POA: Diagnosis not present

## 2021-12-31 DIAGNOSIS — Z9181 History of falling: Secondary | ICD-10-CM | POA: Diagnosis not present

## 2021-12-31 DIAGNOSIS — M1990 Unspecified osteoarthritis, unspecified site: Secondary | ICD-10-CM | POA: Diagnosis not present

## 2022-01-08 ENCOUNTER — Encounter: Payer: Medicare Other | Admitting: Surgery

## 2022-01-31 DIAGNOSIS — I739 Peripheral vascular disease, unspecified: Secondary | ICD-10-CM | POA: Diagnosis not present

## 2022-01-31 DIAGNOSIS — M1990 Unspecified osteoarthritis, unspecified site: Secondary | ICD-10-CM | POA: Diagnosis not present

## 2022-01-31 DIAGNOSIS — I69898 Other sequelae of other cerebrovascular disease: Secondary | ICD-10-CM | POA: Diagnosis not present

## 2022-01-31 DIAGNOSIS — M79606 Pain in leg, unspecified: Secondary | ICD-10-CM | POA: Diagnosis not present

## 2022-01-31 DIAGNOSIS — Z9181 History of falling: Secondary | ICD-10-CM | POA: Diagnosis not present

## 2022-03-02 DIAGNOSIS — I69898 Other sequelae of other cerebrovascular disease: Secondary | ICD-10-CM | POA: Diagnosis not present

## 2022-03-02 DIAGNOSIS — I739 Peripheral vascular disease, unspecified: Secondary | ICD-10-CM | POA: Diagnosis not present

## 2022-03-02 DIAGNOSIS — M1990 Unspecified osteoarthritis, unspecified site: Secondary | ICD-10-CM | POA: Diagnosis not present

## 2022-03-02 DIAGNOSIS — M79606 Pain in leg, unspecified: Secondary | ICD-10-CM | POA: Diagnosis not present

## 2022-03-02 DIAGNOSIS — Z9181 History of falling: Secondary | ICD-10-CM | POA: Diagnosis not present

## 2022-03-28 ENCOUNTER — Ambulatory Visit: Payer: Medicare Other

## 2022-04-02 DIAGNOSIS — I739 Peripheral vascular disease, unspecified: Secondary | ICD-10-CM | POA: Diagnosis not present

## 2022-04-02 DIAGNOSIS — M79606 Pain in leg, unspecified: Secondary | ICD-10-CM | POA: Diagnosis not present

## 2022-04-02 DIAGNOSIS — M1990 Unspecified osteoarthritis, unspecified site: Secondary | ICD-10-CM | POA: Diagnosis not present

## 2022-04-02 DIAGNOSIS — I69898 Other sequelae of other cerebrovascular disease: Secondary | ICD-10-CM | POA: Diagnosis not present

## 2022-04-02 DIAGNOSIS — Z9181 History of falling: Secondary | ICD-10-CM | POA: Diagnosis not present

## 2022-04-04 ENCOUNTER — Telehealth: Payer: Self-pay | Admitting: Internal Medicine

## 2022-04-04 NOTE — Telephone Encounter (Signed)
PT visits today with a disability parking placard form to be filled out and mailed out. Form has been placed in Dr.John's mailbox.  CB if needed: [phone] Address: [address] [address]

## 2022-04-07 ENCOUNTER — Ambulatory Visit (INDEPENDENT_AMBULATORY_CARE_PROVIDER_SITE_OTHER): Payer: Medicare Other

## 2022-04-07 DIAGNOSIS — Z0000 Encounter for general adult medical examination without abnormal findings: Secondary | ICD-10-CM

## 2022-04-07 NOTE — Patient Instructions (Signed)
Roberto Wallace , Thank you for taking time to come for your Medicare Wellness Visit. I appreciate your ongoing commitment to your health goals. Please review the following plan we discussed and let me know if I can assist you in the future.   Screening recommendations/referrals: Colonoscopy: No longer recommended due to age. Recommended yearly ophthalmology/optometry visit for glaucoma screening and checkup Recommended yearly dental visit for hygiene and checkup  Vaccinations: Influenza vaccine: 05/27/2021 Pneumococcal vaccine: 08/06/2016, 04/28/2017 Tdap vaccine: 04/16/2015; de every 10 years Shingles vaccine: 12/26/2019, 02/23/2020   Covid-19: 12/02/2019, 02/28/2021, 06/11/2021  Advanced directives: Yes; Please bring a copy of your health care power of attorney and living will to the office at your convenience.  Conditions/risks identified: Yes  Next appointment: Please schedule your next Medicare Wellness Visit with your Nurse Health Advisor in 1 year by calling [phone].  Preventive Care 2 Years and Older, Male Preventive care refers to lifestyle choices and visits with your health care provider that can promote health and wellness. What does preventive care include? A yearly physical exam. This is also called an annual well check. Dental exams once or twice a year. Routine eye exams. Ask your health care provider how often you should have your eyes checked. Personal lifestyle choices, including: Daily care of your teeth and gums. Regular physical activity. Eating a healthy diet. Avoiding tobacco and drug use. Limiting alcohol use. Practicing safe sex. Taking low doses of aspirin every day. Taking vitamin and mineral supplements as recommended by your health care provider. What happens during an annual well check? The services and screenings done by your health care provider during your annual well check will depend on your age, overall health, lifestyle risk factors, and family  history of disease. Counseling  Your health care provider may ask you questions about your: Alcohol use. Tobacco use. Drug use. Emotional well-being. Home and relationship well-being. Sexual activity. Eating habits. History of falls. Memory and ability to understand (cognition). Work and work Astronomer. Screening  You may have the following tests or measurements: Height, weight, and BMI. Blood pressure. Lipid and cholesterol levels. These may be checked every 5 years, or more frequently if you are over 60 years old. Skin check. Lung cancer screening. You may have this screening every year starting at age 3 if you have a 30-pack-year history of smoking and currently smoke or have quit within the past 15 years. Fecal occult blood test (FOBT) of the stool. You may have this test every year starting at age 25. Flexible sigmoidoscopy or colonoscopy. You may have a sigmoidoscopy every 5 years or a colonoscopy every 10 years starting at age 95. Prostate cancer screening. Recommendations will vary depending on your family history and other risks. Hepatitis C blood test. Hepatitis B blood test. Sexually transmitted disease (STD) testing. Diabetes screening. This is done by checking your blood sugar (glucose) after you have not eaten for a while (fasting). You may have this done every 1-3 years. Abdominal aortic aneurysm (AAA) screening. You may need this if you are a current or former smoker. Osteoporosis. You may be screened starting at age 44 if you are at high risk. Talk with your health care provider about your test results, treatment options, and if necessary, the need for more tests. Vaccines  Your health care provider may recommend certain vaccines, such as: Influenza vaccine. This is recommended every year. Tetanus, diphtheria, and acellular pertussis (Tdap, Td) vaccine. You may need a Td booster every 10 years. Zoster vaccine. You may  need this after age 22. Pneumococcal  13-valent conjugate (PCV13) vaccine. One dose is recommended after age 21. Pneumococcal polysaccharide (PPSV23) vaccine. One dose is recommended after age 31. Talk to your health care provider about which screenings and vaccines you need and how often you need them. This information is not intended to replace advice given to you by your health care provider. Make sure you discuss any questions you have with your health care provider. Document Released: 09/07/2015 Document Revised: 04/30/2016 Document Reviewed: 06/12/2015 Elsevier Interactive Patient Education  2017 [place] Prevention in the Home Falls can cause injuries. They can happen to people of all ages. There are many things you can do to make your home safe and to help prevent falls. What can I do on the outside of my home? Regularly fix the edges of walkways and driveways and fix any cracks. Remove anything that might make you trip as you walk through a door, such as a raised step or threshold. Trim any bushes or trees on the path to your home. Use bright outdoor lighting. Clear any walking paths of anything that might make someone trip, such as rocks or tools. Regularly check to see if handrails are loose or broken. Make sure that both sides of any steps have handrails. Any raised decks and porches should have guardrails on the edges. Have any leaves, snow, or ice cleared regularly. Use sand or salt on walking paths during winter. Clean up any spills in your garage right away. This includes oil or grease spills. What can I do in the bathroom? Use night lights. Install grab bars by the toilet and in the tub and shower. Do not use towel bars as grab bars. Use non-skid mats or decals in the tub or shower. If you need to sit down in the shower, use a plastic, non-slip stool. Keep the floor dry. Clean up any water that spills on the floor as soon as it happens. Remove soap buildup in the tub or shower regularly. Attach  bath mats securely with double-sided non-slip rug tape. Do not have throw rugs and other things on the floor that can make you trip. What can I do in the bedroom? Use night lights. Make sure that you have a light by your bed that is easy to reach. Do not use any sheets or blankets that are too big for your bed. They should not hang down onto the floor. Have a firm chair that has side arms. You can use this for support while you get dressed. Do not have throw rugs and other things on the floor that can make you trip. What can I do in the kitchen? Clean up any spills right away. Avoid walking on wet floors. Keep items that you use a lot in easy-to-reach places. If you need to reach something above you, use a strong step stool that has a grab bar. Keep electrical cords out of the way. Do not use floor polish or wax that makes floors slippery. If you must use wax, use non-skid floor wax. Do not have throw rugs and other things on the floor that can make you trip. What can I do with my stairs? Do not leave any items on the stairs. Make sure that there are handrails on both sides of the stairs and use them. Fix handrails that are broken or loose. Make sure that handrails are as long as the stairways. Check any carpeting to make sure that it is firmly attached  to the stairs. Fix any carpet that is loose or worn. Avoid having throw rugs at the top or bottom of the stairs. If you do have throw rugs, attach them to the floor with carpet tape. Make sure that you have a light switch at the top of the stairs and the bottom of the stairs. If you do not have them, ask someone to add them for you. What else can I do to help prevent falls? Wear shoes that: Do not have high heels. Have rubber bottoms. Are comfortable and fit you well. Are closed at the toe. Do not wear sandals. If you use a stepladder: Make sure that it is fully opened. Do not climb a closed stepladder. Make sure that both sides of the  stepladder are locked into place. Ask someone to hold it for you, if possible. Clearly mark and make sure that you can see: Any grab bars or handrails. First and last steps. Where the edge of each step is. Use tools that help you move around (mobility aids) if they are needed. These include: Canes. Walkers. Scooters. Crutches. Turn on the lights when you go into a dark area. Replace any light bulbs as soon as they burn out. Set up your furniture so you have a clear path. Avoid moving your furniture around. If any of your floors are uneven, fix them. If there are any pets around you, be aware of where they are. Review your medicines with your doctor. Some medicines can make you feel dizzy. This can increase your chance of falling. Ask your doctor what other things that you can do to help prevent falls. This information is not intended to replace advice given to you by your health care provider. Make sure you discuss any questions you have with your health care provider. Document Released: 06/07/2009 Document Revised: 01/17/2016 Document Reviewed: 09/15/2014 Elsevier Interactive Patient Education  2017 [employer].

## 2022-04-07 NOTE — Progress Notes (Addendum)
I connected with Roberto Wallace today by telephone and verified that I am speaking with the correct person using two identifiers. Location patient: home Location provider: work Persons participating in the virtual visit: patient, provider.   I discussed the limitations, risks, security and privacy concerns of performing an evaluation and management service by telephone and the availability of in person appointments. I also discussed with the patient that there may be a patient responsible charge related to this service. The patient expressed understanding and verbally consented to this telephonic visit.    Interactive audio and video telecommunications were attempted between this provider and patient, however failed, due to patient having technical difficulties OR patient did not have access to video capability.  We continued and completed visit with audio only.  Some vital signs may be absent or patient reported.   Time Spent with patient on telephone encounter: 30 minutes  Subjective:   Roberto Wallace. is a 81 y.o. male who presents for Medicare Annual/Subsequent preventive examination.  Review of Systems     Cardiac Risk Factors include: advanced age (>72men, >62 women);dyslipidemia;hypertension;male gender;smoking/ tobacco exposure     Objective:    There were no vitals filed for this visit. There is no height or weight on file to calculate BMI.     04/07/2022    1:56 PM 08/15/2021   10:01 AM 07/09/2021   11:59 AM 05/15/2021   12:18 PM 10/16/2017    7:00 PM 10/16/2017   11:50 AM 04/09/2017   11:55 AM  Advanced Directives  Does Patient Have a Medical Advance Directive? Yes No Unable to assess, patient is non-responsive or altered mental status No No No No  Type of Advance Directive Living will        Does patient want to make changes to medical advance directive? No - Patient declined        Would patient like information on creating a medical advance directive?  Yes  (MAU/Ambulatory/Procedural Areas - Information given)   No - Patient declined      Current Medications (verified) Outpatient Encounter Medications as of 04/07/2022  Medication Sig   aspirin 81 MG EC tablet Take 1 tablet (81 mg total) by mouth daily. Swallow whole.   atorvastatin (LIPITOR) 40 MG tablet Take 1 tablet (40 mg total) by mouth daily.   Cholecalciferol (THERA-D 2000) 50 MCG (2000 UT) TABS 1 tab by mouth once daily   donepezil (ARICEPT) 10 MG tablet Take 1 tablet (10 mg total) by mouth at bedtime.   fexofenadine (ALLEGRA) 180 MG tablet    hydrochlorothiazide (HYDRODIURIL) 25 MG tablet    losartan (COZAAR) 100 MG tablet    memantine (NAMENDA) 5 MG tablet Take 1 tablet (5 mg total) by mouth 2 (two) times daily.   metoprolol succinate (TOPROL XL) 25 MG 24 hr tablet Take 0.5 tablets (12.5 mg total) by mouth at bedtime.   sertraline (ZOLOFT) 25 MG tablet Take 1 tablet (25 mg total) by mouth daily.   tadalafil (CIALIS) 5 MG tablet Take 1 tablet (5 mg total) by mouth daily.   vitamin B-12 (CYANOCOBALAMIN) 100 MCG tablet Take 1 tablet (100 mcg total) by mouth daily.   No facility-administered encounter medications on file as of 04/07/2022.    Allergies (verified) Pollen extract and Amlodipine   History: Past Medical History:  Diagnosis Date   Aneurysm (HCC)    per pt,in head   Arthritis    Chicken pox    Depression    Hypercholesteremia  Hypertension    Migraine    PTSD (post-traumatic stress disorder) 06/24/2017   Stroke ([place])    No residual effects   Syphilis    Tuberculosis    Vitamin D deficiency    Past Surgical History:  Procedure Laterality Date   BUBBLE STUDY  08/15/2021   Procedure: BUBBLE STUDY;  Surgeon: Jerline Pain, MD;  Location: [facility];  Service: Cardiovascular;;   COLONOSCOPY     HERNIA REPAIR Right    x 2   TEE WITHOUT CARDIOVERSION N/A 08/15/2021   Procedure: TRANSESOPHAGEAL ECHOCARDIOGRAM (TEE);  Surgeon: Jerline Pain, MD;  [facility]  ENDOSCOPY;  Service: Cardiovascular;  Laterality: N/A;   Family History  Problem Relation Age of Onset   Dementia Mother    Healthy Father    Prostate cancer Brother    Social History   Socioeconomic History   Marital status: Widowed    Spouse name: Not on file   Number of children: 2   Years of education: 14   Highest education level: Not on file  Occupational History   Occupation: Retired  Tobacco Use   Smoking status: Former    Packs/day: 0.50    Years: 50.00    Total pack years: 25.00    Types: Cigarettes    Quit date: 10/29/2021    Years since quitting: 0.4   Smokeless tobacco: Never  Vaping Use   Vaping Use: Never used  Substance and Sexual Activity   Alcohol use: Yes    Alcohol/week: 2.0 - 3.0 standard drinks of alcohol    Types: 2 - 3 Shots of liquor per week    Comment: social, 3 per day   Drug use: No   Sexual activity: Never  Other Topics Concern   Not on file  Social History Narrative   Fun: Sit on the back porch and relax.   Denies religious beliefs effecting health care.    Lives alone in a one story home.  Has 2 biological children and 3 adopted children.   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   Social Determinants of Health   Financial Resource Strain: Low Risk  (04/07/2022)   Overall Financial Resource Strain (CARDIA)    Difficulty of Paying Living Expenses: Not hard at all  Food Insecurity: No Food Insecurity (04/07/2022)   Hunger Vital Sign    Worried About Running Out of Food in the Last Year: Never true    Ran Out of Food in the Last Year: Never true  Transportation Needs: No Transportation Needs (04/07/2022)   PRAPARE - Hydrologist (Medical): No    Lack of Transportation (Non-Medical): No  Physical Activity: Inactive (04/07/2022)   Exercise Vital Sign    Days of Exercise per Week: 0 days    Minutes of Exercise per Session: 0 min  Stress: No Stress Concern Present (04/07/2022)   [place]    Feeling of Stress : Not at all  Social Connections: Unknown (04/07/2022)   Social Connection and Isolation Panel [NHANES]    Frequency of Communication with Friends and Family: Patient refused    Frequency of Social Gatherings with Friends and Family: Patient refused    Attends Religious Services: Patient refused    Active Member of Clubs or Organizations: Patient refused    Attends Archivist Meetings: Patient refused    Marital Status: Patient refused    Tobacco Counseling Counseling given: Not  Answered   Clinical Intake:  Pre-visit preparation completed: Yes  Pain : No/denies pain     Nutritional Risks: None Diabetes: No  How often do you need to have someone help you when you read instructions, pamphlets, or other written materials from your doctor or pharmacy?: 1 - Never 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 no  Interpreter Needed?: No  Information entered by :: Lisette Abu, LPN.   Activities of Daily Living    04/07/2022    2:00 PM  In your present state of health, do you have any difficulty performing the following activities:  Hearing? 0  Vision? 0  Difficulty concentrating or making decisions? 0  Walking or climbing stairs? 1  Dressing or bathing? 0  Doing errands, shopping? 0  Preparing Food and eating ? N  Using the Toilet? N  In the past six months, have you accidently leaked urine? N  Do you have problems with loss of bowel control? N  Managing your Medications? N  Managing your Finances? N  Housekeeping or managing your Housekeeping? N    Patient Care Team: [name], MD as PCP - General (Internal Medicine) Early Osmond, MD as PCP - Cardiology (Cardiology) Wellington Hampshire, MD as PCP - [facility] (Cardiology)  Indicate any recent Medical Services you may have received from other than [facility] providers in the past year  (date may be approximate).     Assessment:   This is a routine wellness examination for [place].  Hearing/Vision screen Hearing Screening - Comments:: Patient denied any hearing difficulty.   No hearing aids.   Vision Screening - Comments:: Patient does wear corrective lenses/contacts.  Eye exam done by: [facility]    Dietary issues and exercise activities discussed: Current Exercise Habits: The patient does not participate in regular exercise at present, Exercise limited by: orthopedic condition(s)   Goals Addressed             This Visit's Progress    Stay alive and keep GOD first.        Depression Screen    04/07/2022    1:38 PM 10/30/2021   10:35 AM 10/30/2021   10:09 AM 09/23/2021   10:58 AM 01/09/2021    9:30 AM 12/12/2020    1:23 PM 02/01/2020   11:40 AM  PHQ 2/9 Scores  PHQ - 2 Score 0 0 0 3 0 4 0  PHQ- 9 Score   0 11  6     Fall Risk    04/07/2022    1:36 PM 10/30/2021   10:34 AM 10/30/2021   10:09 AM 12/12/2020    1:23 PM 02/01/2020   11:40 AM  Fall Risk   Falls in the past year? 0 0 0 0 0  Number falls in past yr: 0 0 0 0   Injury with Fall? 0 0 0 0   Risk for fall due to : No Fall Risks  Impaired balance/gait Other (Comment)   Follow up Falls evaluation completed        FALL RISK PREVENTION PERTAINING TO THE HOME:  Any stairs in or around the home? No  If so, are there any without handrails? No  Home free of loose throw rugs in walkways, pet beds, electrical cords, etc? Yes  Adequate lighting in your home to reduce risk of falls? Yes   ASSISTIVE DEVICES UTILIZED TO PREVENT FALLS:  Life alert? No  Use of a cane, walker  or w/c? Yes  Grab bars in the bathroom? Yes  Shower chair or bench in shower? Yes  Elevated toilet seat or a handicapped toilet? Yes   TIMED UP AND GO:  Was the test performed? No .  Length of time to ambulate 10 feet: n/a sec.   Appearance of gait: Gait not evaluated during this visit.  Cognitive Function:    04/07/2022     1:58 PM  MMSE - Mini Mental State Exam  Not completed: Unable to complete      07/31/2016    9:04 AM 10/30/2015   11:15 AM 10/30/2015   10:01 AM  Montreal Cognitive Assessment   Visuospatial/ Executive (0/5) 5  5  Naming (0/3) 2  3  Attention: Read list of digits (0/2) 1  2  Attention: Read list of letters (0/1) 1  1  Attention: Serial 7 subtraction starting at 100 (0/3) 0 2 1  Language: Repeat phrase (0/2) 2  2  Language : Fluency (0/1) 1  0  Abstraction (0/2) 2  2  Delayed Recall (0/5) 0  1  Orientation (0/6) 6  6  Total 20  23  Adjusted Score (based on education) 20        Immunizations Immunization History  Administered Date(s) Administered   Influenza, High Dose Seasonal PF 04/28/2017, 05/27/2018   Influenza-Unspecified 06/25/2002, 08/06/2016, 05/27/2018, 05/27/2021   Janssen (J&J) SARS-COV-2 Vaccination 12/02/2019   Moderna Sars-Covid-2 Vaccination 02/28/2021   PFIZER(Purple Top)SARS-COV-2 Vaccination 06/11/2021   Pneumococcal Conjugate-13 10/12/2013, 04/16/2015, 08/06/2016   Pneumococcal Polysaccharide-23 07/04/2009, 04/28/2017   Td 04/16/2015   Tdap 11/18/2011   Zoster Recombinat (Shingrix) 12/26/2019, 02/23/2020   Zoster, Live 05/19/2015    TDAP status: Up to date  Flu Vaccine status: Up to date  Pneumococcal vaccine status: Up to date  Covid-19 vaccine status: Completed vaccines  Qualifies for Shingles Vaccine? Yes   Zostavax completed Yes   Shingrix Completed?: Yes  Screening Tests Health Maintenance  Topic Date Due   INFLUENZA VACCINE  03/25/2022   TETANUS/TDAP  04/15/2025   Pneumonia Vaccine 53+ Years old  Completed   Hepatitis C Screening  Completed   Zoster Vaccines- Shingrix  Completed   HPV VACCINES  Aged Out   COLONOSCOPY (Pts 45-36yrs Insurance coverage will need to be confirmed)  Discontinued   COVID-19 Vaccine  Discontinued    Health Maintenance  Health Maintenance Due  Topic Date Due   INFLUENZA VACCINE  03/25/2022     Colorectal cancer screening: No longer required.   Lung Cancer Screening: (Low Dose CT Chest recommended if Age 103-80 years, 30 pack-year currently smoking OR have quit w/in 15years.) does qualify.   Lung Cancer Screening Referral: no; already a pulmonology patient  Additional Screening:  Hepatitis C Screening: does qualify; Completed 04/16/2015  Vision Screening: Recommended annual ophthalmology exams for early detection of glaucoma and other disorders of the eye. Is the patient up to date with their annual eye exam?  Yes  Who is the provider or what is the name of the office in which the patient attends annual eye exams? [facility] If pt is not established with a provider, would they like to be referred to a provider to establish care? No .   Dental Screening: Recommended annual dental exams for proper oral hygiene  Community Resource Referral / Chronic Care Management: CRR required this visit?  No   CCM required this visit?  No      Plan:     I have personally reviewed  and noted the following in the patient's chart:   Medical and social history Use of alcohol, tobacco or illicit drugs  Current medications and supplements including opioid prescriptions. Patient is not currently taking opioid prescriptions. Functional ability and status Nutritional status Physical activity Advanced directives List of other physicians Hospitalizations, surgeries, and ER visits in previous 12 months Vitals Screenings to include cognitive, depression, and falls Referrals and appointments  In addition, I have reviewed and discussed with patient certain preventive protocols, quality metrics, and best practice recommendations. A written personalized care plan for preventive services as well as general preventive health recommendations were provided to patient.     Sheral Flow, LPN   [id number]   Nurse Notes:  There were no vitals filed for this visit. There is no height or  weight on file to calculate BMI. MMSE: patient unable to complete for this telephone appointment. Patient is cogitatively intact.

## 2022-04-09 ENCOUNTER — Other Ambulatory Visit: Payer: Self-pay

## 2022-04-09 NOTE — Patient Outreach (Signed)
  Care Coordination   Initial Visit Note   04/09/2022 Name: Roberto ROIG [name] MRN: [id number] DOB: September 19, 1940  Roberto Hatchet [name] is a 81 y.o. year old male who sees Corwin Levins, MD for primary care. I spoke with  Roberto Hatchet [name] by phone today  What matters to the patients health and wellness today?  Patient declines services and denies any care coordination or resource needs at this time.    Goals Addressed             This Visit's Progress    COMPLETED: Health Maintainence       Care Coordination Interventions: Advised patient to contact primary care provider if any care coordination needs in the future. Reviewed any transportation of food insecurity needs Confirmed AWV completed 04/07/22 and [facility]         SDOH assessments and interventions completed:  Yes  SDOH Interventions Today    Flowsheet Row Most Recent Value  SDOH Interventions   Food Insecurity Interventions Intervention Not Indicated  Transportation Interventions Intervention Not Indicated        Care Coordination Interventions Activated:  Yes  Care Coordination Interventions:  Yes, provided   Follow up plan: No further intervention required.   Encounter Outcome:  Pt. Visit Completed   Kathyrn Sheriff, RN, MSN, BSN, CCM Care Coordinator [phone]

## 2022-04-10 ENCOUNTER — Telehealth: Payer: Self-pay

## 2022-04-10 NOTE — Telephone Encounter (Signed)
LVM for pt to call back to pick up his placard

## 2022-05-03 DIAGNOSIS — M79606 Pain in leg, unspecified: Secondary | ICD-10-CM | POA: Diagnosis not present

## 2022-05-03 DIAGNOSIS — Z9181 History of falling: Secondary | ICD-10-CM | POA: Diagnosis not present

## 2022-05-03 DIAGNOSIS — I69898 Other sequelae of other cerebrovascular disease: Secondary | ICD-10-CM | POA: Diagnosis not present

## 2022-05-03 DIAGNOSIS — M1990 Unspecified osteoarthritis, unspecified site: Secondary | ICD-10-CM | POA: Diagnosis not present

## 2022-05-03 DIAGNOSIS — I739 Peripheral vascular disease, unspecified: Secondary | ICD-10-CM | POA: Diagnosis not present

## 2022-05-05 NOTE — Progress Notes (Signed)
Office Visit    Patient Name: Roberto NIPPERT [name] Date of Encounter: 05/05/2022  Primary Care Provider:  Corwin Levins, MD Primary Cardiologist:  [name], MD Primary Electrophysiologist: None  Chief Complaint    Roberto Hatchet [name] is a 81 y.o. male with PMH of NSVT, CVA, HTN, HLD, tobacco abuse, PAD who presents today for follow-up of hypertension and coronary artery disease.  Past Medical History    Past Medical History:  Diagnosis Date   Aneurysm (HCC)    per pt,in head   Arthritis    Chicken pox    Depression    Hypercholesteremia    Hypertension    Migraine    PTSD (post-traumatic stress disorder) 06/24/2017   Stroke (HCC)    No residual effects   Syphilis    Tuberculosis    Vitamin D deficiency    Past Surgical History:  Procedure Laterality Date   BUBBLE STUDY  08/15/2021   Procedure: BUBBLE STUDY;  Surgeon: Jake Bathe, MD;  Location: [facility] ENDOS[facility];  Service: Cardiovascular;;   COLONOSCOPY     HERNIA REPAIR Right    x 2   TEE WITHOUT CARDIOVERSION N/A 08/15/2021   Procedure: TRANSESOPHAGEAL ECHOCARDIOGRAM (TEE);  Surgeon: Jake Bathe, MD;  Location: [facility] ENDOSCOPY;  Service: Cardiovascular;  Laterality: N/A;    Allergies  Allergies  Allergen Reactions   Pollen Extract     Other reaction(s): Sinusitis   Amlodipine Rash    History of Present Illness    Roberto HIGHFILL [name] has a PMH of is a 81 year old male with the above mention past medical history who presents today for follow-up of CAD and hypertension.  Roberto Wallace was initially seen in 2022 by Dr.Thukkani for evaluation of NSVT and presyncope.  Patient had witnessed event after using the restroom.  EKG and carotid Dopplers were both unremarkable.  ZIO monitor was placed that showed nonsustained VT with a 3 beat run.  He was started on Toprol and 2D echo was obtained that showed EF 45-50%, LV WMAs, mild RVE, and evidence of atrial level shunting detected by color flow Doppler.   Patient had follow-up TEE in 07/2021 that revealed ASD.  Patient was referred to structural heart team for further evaluation.  Roberto Wallace was referred to Dr. Kirke Corin for intermittent claudication.  He underwent vascular studies that revealed ABI of 0.72 on the right and 0.7 on the left.  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  Patient is wheelchair-bound and has no evidence of critical limb ischemia.  Medical therapy was recommended instead of revascularization.  Roberto Wallace was last seen on 11/2021.  During visit patient was informed that ASD closure would not be an option he agreed with nonsurgical management.  Patient was also reaching out to the [place] regarding evaluation of AAA.  Roberto Wallace presents today for annual follow-up alone.  Since last being seen in the office patient reports he has had no cardiac complaints since previous visit.  Patient is still smoking but states that he has discussed with his PCP plan and is cut down to 2 to 3 cigarettes/day.  He is compliant with his current medication regimen and denies any side effects.  Blood pressure today was well controlled at 130/72.  Patient has cold hands today and heart rate was 58 by pulse oximeter.  Patient denies chest pain, palpitations, dyspnea, PND, orthopnea, nausea, vomiting, dizziness, syncope, edema, weight gain,  or early satiety.  Home Medications    Current Outpatient Medications  Medication Sig Dispense Refill   aspirin 81 MG EC tablet Take 1 tablet (81 mg total) by mouth daily. Swallow whole. 30 tablet 12   atorvastatin (LIPITOR) 40 MG tablet Take 1 tablet (40 mg total) by mouth daily. 90 tablet 3   Cholecalciferol (THERA-D 2000) 50 MCG (2000 UT) TABS 1 tab by mouth once daily 90 tablet 99   donepezil (ARICEPT) 10 MG tablet Take 1 tablet (10 mg total) by mouth at bedtime. 90 tablet 3   fexofenadine (ALLEGRA) 180 MG tablet      hydrochlorothiazide (HYDRODIURIL) 25 MG tablet       losartan (COZAAR) 100 MG tablet      memantine (NAMENDA) 5 MG tablet Take 1 tablet (5 mg total) by mouth 2 (two) times daily. 180 tablet 3   metoprolol succinate (TOPROL XL) 25 MG 24 hr tablet Take 0.5 tablets (12.5 mg total) by mouth at bedtime. 45 tablet 3   sertraline (ZOLOFT) 25 MG tablet Take 1 tablet (25 mg total) by mouth daily. 90 tablet 3   tadalafil (CIALIS) 5 MG tablet Take 1 tablet (5 mg total) by mouth daily. 30 tablet 11   vitamin B-12 (CYANOCOBALAMIN) 100 MCG tablet Take 1 tablet (100 mcg total) by mouth daily. 90 tablet 3   No current facility-administered medications for this visit.     Review of Systems  Please see the history of present illness.    (+) Rash on right side of face (+) Cold intolerance  All other systems reviewed and are otherwise negative except as noted above.  Physical Exam    Wt Readings from Last 3 Encounters:  12/10/21 121 lb (54.9 kg)  10/30/21 117 lb (53.1 kg)  10/15/21 124 lb (56.2 kg)   MH:DQQIW were no vitals filed for this visit.,There is no height or weight on file to calculate BMI.  Constitutional:      Appearance: Healthy appearance. Not in distress.  Neck:     Vascular: JVD normal.  Pulmonary:     Effort: Pulmonary effort is normal.     Breath sounds: No wheezing. No rales. Diminished in the bases Cardiovascular:     Normal rate. Regular rhythm. Normal S1. Normal S2.      Murmurs: There is no murmur.  Edema:    Peripheral edema absent.  Abdominal:     Palpations: Abdomen is soft non tender. There is no hepatomegaly.  Skin:    General: Skin is warm and dry.  Neurological:     General: No focal deficit present.     Mental Status: Alert and oriented to person, place and time.     Cranial Nerves: Cranial nerves are intact.  EKG/LABS/Other Studies Reviewed    ECG personally reviewed by me today -none completed today   Lab Results  Component Value Date   WBC 6.5 08/14/2021   HGB 13.7 08/14/2021   HCT 40.5  08/14/2021   MCV 86 08/14/2021   PLT 231 08/14/2021   Lab Results  Component Value Date   CREATININE 0.91 10/15/2021   BUN 12 10/15/2021   NA 138 10/15/2021   K 4.0 10/15/2021   CL 100 10/15/2021   CO2 27 10/15/2021   Lab Results  Component Value Date   ALT 17 07/09/2021   AST 18 07/09/2021   ALKPHOS 69 07/09/2021   BILITOT 1.0 07/09/2021   Lab Results  Component Value Date   CHOL 184 12/12/2020  HDL 75.10 12/12/2020   LDLCALC 97 12/12/2020   TRIG 58.0 12/12/2020   CHOLHDL 2 12/12/2020    Lab Results  Component Value Date   HGBA1C 5.6 06/03/2021    Assessment & Plan    1.  Coronary artery disease: -Patient had coronary calcifications noted on recent CT scan -Patient has history of ASD that was evaluated by structural heart team and is currently being treated medically. -Continue current GDMT with ASA 81 mg and atorvastatin 40 mg daily, Toprol 12.5 mg daily  2.  PAD: -Today patient denies any claudication and reports that he is in the process of quitting smoking and is down to 2 to 3 cigarettes/day.   3.  History of CVA: -Continue ASA 81 mg and Lipitor 40 mg as noted above -Patient has ASD and elected to treat medically due to comorbidities and age  81.  Hypertension: -Patient's blood pressure today is well controlled at 130/72 -Toprol XL 12.5 mg daily and Cozaar 100 mg daily  Disposition: Follow-up with Early Osmond, MD or APP in 6 months    Medication Adjustments/Labs and Tests Ordered: Current medicines are reviewed at length with the patient today.  Concerns regarding medicines are outlined above.   Signed, Mable Fill, Marissa Nestle, NP 05/05/2022, 4:33 PM [place]

## 2022-05-07 ENCOUNTER — Ambulatory Visit (INDEPENDENT_AMBULATORY_CARE_PROVIDER_SITE_OTHER): Payer: Medicare Other | Admitting: Internal Medicine

## 2022-05-07 VITALS — BP 120/74 | HR 82 | Temp 97.8°F | Ht 69.0 in | Wt 115.0 lb

## 2022-05-07 DIAGNOSIS — I10 Essential (primary) hypertension: Secondary | ICD-10-CM

## 2022-05-07 DIAGNOSIS — L989 Disorder of the skin and subcutaneous tissue, unspecified: Secondary | ICD-10-CM

## 2022-05-07 DIAGNOSIS — E7800 Pure hypercholesterolemia, unspecified: Secondary | ICD-10-CM | POA: Diagnosis not present

## 2022-05-07 DIAGNOSIS — R739 Hyperglycemia, unspecified: Secondary | ICD-10-CM

## 2022-05-07 MED ORDER — ASPIRIN 81 MG PO TBEC: 81.0000 mg | DELAYED_RELEASE_TABLET | Freq: Every day | ORAL | 12 refills | Status: DC

## 2022-05-07 NOTE — Patient Instructions (Signed)
Please continue all other medications as before, and refills have been done if requested.  Please have the pharmacy call with any other refills you may need.  Please continue your efforts at being more active, low cholesterol diet, and weight control.  Please keep your appointments with your specialists as you may have planned  You will be contacted regarding the referral for: Dermatology  Please quit smoking  [facility] the [facility] area  Please make an Appointment to return in 6 months, or sooner if needed

## 2022-05-07 NOTE — Progress Notes (Signed)
Patient ID: Roberto Ros., male   DOB: [birth year], 81 y.o.   MRN: [id number]        Chief Complaint: follow up left face lesion, RIH, smoking, hyperglycemia       HPI:  Roberto Wallace. is a 81 y.o. male here overall doing ok, has a place to stay now, good med compliance, Pt denies chest pain, increased sob or doe, wheezing, orthopnea, PND, increased LE swelling, palpitations, dizziness or syncope.   Pt denies polydipsia, polyuria, or new focal neuro s/s.   Pt denies fever, night sweats, loss of appetite, or other constitutional symptoms  has new enlarging skin lesion left cheek face area for several months just not healing.  Still smoking, not ready to quit.  Has intermittent right inguinal pain but stable mild intermittent swelling that resovles without pain. Also, Plans to have new teeth and glasses from [place] soon. Wt Readings from Last 3 Encounters:  05/09/22 117 lb 0.3 oz (53.1 kg)  05/07/22 115 lb (52.2 kg)  12/10/21 121 lb (54.9 kg)   BP Readings from Last 3 Encounters:  05/09/22 130/72  05/07/22 120/74  12/10/21 118/70         Past Medical History:  Diagnosis Date   Aneurysm (HCC)    per pt,in head   Arthritis    Chicken pox    Depression    Hypercholesteremia    Hypertension    Migraine    PTSD (post-traumatic stress disorder) 06/24/2017   Stroke (HCC)    No residual effects   Syphilis    Tuberculosis    Vitamin D deficiency    Past Surgical History:  Procedure Laterality Date   BUBBLE STUDY  08/15/2021   Procedure: BUBBLE STUDY;  Surgeon: Jake Bathe, MD;  Location: [facility] ENDOSCOPY;  Service: Cardiovascular;;   COLONOSCOPY     HERNIA REPAIR Right    x 2   TEE WITHOUT CARDIOVERSION N/A 08/15/2021   Procedure: TRANSESOPHAGEAL ECHOCARDIOGRAM (TEE);  Surgeon: Jake Bathe, MD;  Location: [facility] ENDOSCOPY;  Service: Cardiovascular;  Laterality: N/A;    reports that he quit smoking about 6 months ago. His smoking use included cigarettes. He has a 25.00  pack-year smoking history. He has never used smokeless tobacco. He reports current alcohol use of about 2.0 - 3.0 standard drinks of alcohol per week. He reports that he does not use drugs. family history includes Dementia in his mother; Healthy in his father; Prostate cancer in his brother. Allergies  Allergen Reactions   Pollen Extract     Other reaction(s): Sinusitis   Amlodipine Rash   Current Outpatient Medications on File Prior to Visit  Medication Sig Dispense Refill   atorvastatin (LIPITOR) 40 MG tablet Take 1 tablet (40 mg total) by mouth daily. 90 tablet 3   Cholecalciferol (THERA-D 2000) 50 MCG (2000 UT) TABS 1 tab by mouth once daily 90 tablet 99   donepezil (ARICEPT) 10 MG tablet Take 1 tablet (10 mg total) by mouth at bedtime. 90 tablet 3   fexofenadine (ALLEGRA) 180 MG tablet      hydrochlorothiazide (HYDRODIURIL) 25 MG tablet      losartan (COZAAR) 100 MG tablet      memantine (NAMENDA) 5 MG tablet Take 1 tablet (5 mg total) by mouth 2 (two) times daily. 180 tablet 3   metoprolol succinate (TOPROL XL) 25 MG 24 hr tablet Take 0.5 tablets (12.5 mg total) by mouth at bedtime. 45 tablet 3   sertraline (ZOLOFT) 25  MG tablet Take 1 tablet (25 mg total) by mouth daily. 90 tablet 3   tadalafil (CIALIS) 5 MG tablet Take 1 tablet (5 mg total) by mouth daily. 30 tablet 11   vitamin B-12 (CYANOCOBALAMIN) 100 MCG tablet Take 1 tablet (100 mcg total) by mouth daily. 90 tablet 3   No current facility-administered medications on file prior to visit.        ROS:  All others reviewed and negative.  Objective        PE:  BP 120/74 (BP Location: Left Arm, Patient Position: Sitting, Cuff Size: Large)   Pulse 82   Temp 97.8 F (36.6 C) (Oral)   Ht 5\' 9"  (1.753 m)   Wt 115 lb (52.2 kg)   SpO2 97%   BMI 16.98 kg/m                 Constitutional: Pt appears in NAD               HENT: Head: NCAT.                Right Ear: External ear normal.                 Left Ear: External ear  normal.                Eyes: . Pupils are equal, round, and reactive to light. Conjunctivae and EOM are normal               Nose: without d/c or deformity               Neck: Neck supple. Gross normal ROM               Cardiovascular: Normal rate and regular rhythm.                 Pulmonary/Chest: Effort normal and breath sounds without rales or wheezing.                Abd:  Soft, NT, ND, + BS, no organomegaly               Neurological: Pt is alert. At baseline orientation, motor grossly intact               Skin: Skin is warm LE edema - none, left face with 1 cm area tan dark lesion near lateral canthus               Psychiatric: Pt behavior is normal without agitation   Micro: none  Cardiac tracings I have personally interpreted today:  none  Pertinent Radiological findings (summarize): none   Lab Results  Component Value Date   WBC 6.5 08/14/2021   HGB 13.7 08/14/2021   HCT 40.5 08/14/2021   PLT 231 08/14/2021   GLUCOSE 73 10/15/2021   CHOL 184 12/12/2020   TRIG 58.0 12/12/2020   HDL 75.10 12/12/2020   LDLCALC 97 12/12/2020   ALT 17 07/09/2021   AST 18 07/09/2021   NA 138 10/15/2021   K 4.0 10/15/2021   CL 100 10/15/2021   CREATININE 0.91 10/15/2021   BUN 12 10/15/2021   CO2 27 10/15/2021   TSH 1.26 12/12/2020   PSA 2.77 12/12/2020   INR 0.99 10/16/2017   HGBA1C 5.6 06/03/2021   Assessment/Plan:  Roberto ERGLE [name] is a 81 y.o. Black or African American [2] male with  has a past medical history of Aneurysm (HCC), Arthritis, Chicken pox, Depression, Hypercholesteremia,  Hypertension, Migraine, PTSD (post-traumatic stress disorder) (06/24/2017), Stroke (HCC), Syphilis, Tuberculosis, and Vitamin D deficiency.  Hyperglycemia Lab Results  Component Value Date   HGBA1C 5.6 06/03/2021   Stable, pt to continue current medical treatment - diet, wt control, exccercise   Hyperlipidemia Lab Results  Component Value Date   LDLCALC 97 12/12/2020   Stable, pt to  continue current statin lipitor 40 mg qd   HYPERTENSION, BENIGN ESSENTIAL BP Readings from Last 3 Encounters:  05/09/22 130/72  05/07/22 120/74  12/10/21 118/70   Stable, pt to continue medical treatment hct 25 qd, losartan 100 mg qd, toprol xl 25 mg qd   Lesion of skin of face New worsening left face - for dermatology referral  Followup: No follow-ups on file.  [name], MD 05/10/2022 9:28 PM [facility]

## 2022-05-09 ENCOUNTER — Ambulatory Visit: Payer: Medicare Other | Attending: Nurse Practitioner | Admitting: Nurse Practitioner

## 2022-05-09 ENCOUNTER — Encounter: Payer: Self-pay | Admitting: Nurse Practitioner

## 2022-05-09 VITALS — BP 130/72 | HR 58 | Ht 69.5 in | Wt 117.0 lb

## 2022-05-09 DIAGNOSIS — I10 Essential (primary) hypertension: Secondary | ICD-10-CM | POA: Diagnosis not present

## 2022-05-09 DIAGNOSIS — Q211 Atrial septal defect, unspecified: Secondary | ICD-10-CM

## 2022-05-09 DIAGNOSIS — I2510 Atherosclerotic heart disease of native coronary artery without angina pectoris: Secondary | ICD-10-CM | POA: Diagnosis not present

## 2022-05-09 DIAGNOSIS — E785 Hyperlipidemia, unspecified: Secondary | ICD-10-CM | POA: Diagnosis not present

## 2022-05-09 NOTE — Patient Instructions (Signed)
Medication Instructions:   Your physician recommends that you continue on your current medications as directed. Please refer to the Current Medication list given to you today.   *If you need a refill on your cardiac medications before your next appointment, please call your pharmacy*   Lab Work: NONE ORDERED  TODAY   If you have labs (blood work) drawn today and your tests are completely normal, you will receive your results only by: MyChart Message (if you have MyChart) OR A paper copy in the mail If you have any lab test that is abnormal or we need to change your treatment, we will call you to review the results.   Testing/Procedures: NONE ORDERED  TODAY   Follow-Up: At [facility], you and your health needs are our priority.  As part of our continuing mission to provide you with exceptional heart care, we have created designated Provider Care Teams.  These Care Teams include your primary Cardiologist (physician) and Advanced Practice Providers (APPs -  Physician Assistants and Nurse Practitioners) who all work together to provide you with the care you need, when you need it.  We recommend signing up for the patient portal called "MyChart".  Sign up information is provided on this After Visit Summary.  MyChart is used to connect with patients for Virtual Visits (Telemedicine).  Patients are able to view lab/test results, encounter notes, upcoming appointments, etc.  Non-urgent messages can be sent to your provider as well.   To learn more about what you can do with MyChart, go to [email or web address].    Your next appointment:   6 month(s)  The format for your next appointment:   In Person  Provider:  Robin Searing, NP      Other Instructions   Important Information About Sugar

## 2022-05-10 DIAGNOSIS — L989 Disorder of the skin and subcutaneous tissue, unspecified: Secondary | ICD-10-CM | POA: Insufficient documentation

## 2022-05-10 NOTE — Assessment & Plan Note (Signed)
Lab Results  Component Value Date   HGBA1C 5.6 06/03/2021   Stable, pt to continue current medical treatment - diet, wt control, exccercise

## 2022-05-10 NOTE — Assessment & Plan Note (Signed)
New worsening left face - for dermatology referral

## 2022-05-10 NOTE — Assessment & Plan Note (Signed)
Lab Results  Component Value Date   LDLCALC 97 12/12/2020   Stable, pt to continue current statin lipitor 40 mg qd

## 2022-05-10 NOTE — Assessment & Plan Note (Signed)
BP Readings from Last 3 Encounters:  05/09/22 130/72  05/07/22 120/74  12/10/21 118/70   Stable, pt to continue medical treatment hct 25 qd, losartan 100 mg qd, toprol xl 25 mg qd

## 2022-06-02 DIAGNOSIS — M1990 Unspecified osteoarthritis, unspecified site: Secondary | ICD-10-CM | POA: Diagnosis not present

## 2022-06-02 DIAGNOSIS — Z9181 History of falling: Secondary | ICD-10-CM | POA: Diagnosis not present

## 2022-06-02 DIAGNOSIS — I739 Peripheral vascular disease, unspecified: Secondary | ICD-10-CM | POA: Diagnosis not present

## 2022-06-02 DIAGNOSIS — M79606 Pain in leg, unspecified: Secondary | ICD-10-CM | POA: Diagnosis not present

## 2022-06-02 DIAGNOSIS — I69898 Other sequelae of other cerebrovascular disease: Secondary | ICD-10-CM | POA: Diagnosis not present

## 2022-06-05 DIAGNOSIS — L309 Dermatitis, unspecified: Secondary | ICD-10-CM | POA: Diagnosis not present

## 2022-07-03 DIAGNOSIS — I739 Peripheral vascular disease, unspecified: Secondary | ICD-10-CM | POA: Diagnosis not present

## 2022-07-03 DIAGNOSIS — Z9181 History of falling: Secondary | ICD-10-CM | POA: Diagnosis not present

## 2022-07-03 DIAGNOSIS — I69898 Other sequelae of other cerebrovascular disease: Secondary | ICD-10-CM | POA: Diagnosis not present

## 2022-07-03 DIAGNOSIS — M1990 Unspecified osteoarthritis, unspecified site: Secondary | ICD-10-CM | POA: Diagnosis not present

## 2022-07-03 DIAGNOSIS — M79606 Pain in leg, unspecified: Secondary | ICD-10-CM | POA: Diagnosis not present

## 2022-07-03 DIAGNOSIS — L309 Dermatitis, unspecified: Secondary | ICD-10-CM | POA: Diagnosis not present

## 2022-11-10 NOTE — Progress Notes (Addendum)
Office Visit    Patient Name: Roberto ANGIER [name] Date of Encounter: 11/11/2022  Primary Care Provider:  [name], MD Primary Cardiologist:  Early Osmond, MD Primary Electrophysiologist: None  Chief Complaint    Roberto Gourd [name] is a 82 y.o. male with PMH of NSVT, CVA, HTN, HLD, tobacco abuse, PAD who presents today for follow-up of hypertension and coronary artery disease.   Past Medical History    Past Medical History:  Diagnosis Date   Aneurysm ([place])    per pt,in head   Arthritis    Chicken pox    Depression    Hypercholesteremia    Hypertension    Migraine    PTSD (post-traumatic stress disorder) 06/24/2017   Stroke ([place])    No residual effects   Syphilis    Tuberculosis    Vitamin D deficiency    Past Surgical History:  Procedure Laterality Date   BUBBLE STUDY  08/15/2021   Procedure: BUBBLE STUDY;  Surgeon: Jerline Pain, MD;  Location: [facility];  Service: Cardiovascular;;   COLONOSCOPY     HERNIA REPAIR Right    x 2   TEE WITHOUT CARDIOVERSION N/A 08/15/2021   Procedure: TRANSESOPHAGEAL ECHOCARDIOGRAM (TEE);  Surgeon: Jerline Pain, MD;  Location: [facility] ENDOSCOPY;  Service: Cardiovascular;  Laterality: N/A;    Allergies  Allergies  Allergen Reactions   Pollen Extract     Other reaction(s): Sinusitis   Amlodipine Rash    History of Present Illness    Roberto DEBEVEC [name] has a PMH of is a 82 year old male with the above mention past medical history who presents today for follow-up of CAD and hypertension.  Roberto Wallace was initially seen in 2022 by Dr.Thukkani for evaluation of NSVT and presyncope.  Patient had witnessed event after using the restroom.  EKG and carotid Dopplers were both unremarkable.  ZIO monitor was placed that showed nonsustained VT with a 3 beat run.  He was started on Toprol XL and 2D echo was obtained that showed EF 45-50%, LV WMAs, mild RVE, and evidence of atrial level shunting detected by color flow doppler.   Patient had follow-up TEE in 07/2021 that revealed ASD.  Patient was referred to structural heart team for further evaluation.  Roberto Wallace was referred to Dr. [name] for intermittent claudication.  He underwent vascular studies that revealed ABI of 0.72 on the right and 0.7 on the left.  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  Patient is wheelchair-bound and has no evidence of critical limb ischemia.  Medical therapy was recommended instead of revascularization. Roberto Wallace was last seen on 11/2021.  During visit patient was informed that ASD closure would not be an option he agreed with nonsurgical management.  Patient was also reaching out to the [place] regarding evaluation of AAA. He completed a U/S that revealed no evidence of AAA.  He  He was seen in follow-up 05/09/2022 and was doing well however he was still smoking.  Patient's blood pressure was well-controlled and reported no chest pain or shortness of breath.   Roberto Wallace presents today for 67-month follow-up alone.  Since last being seen in the office patient reports that he has stopped all of his medications due to increased life stress moving to a new apartment and forgetting to take them.  His blood pressure today is elevated at 148/98 and was 152/96 on recheck.  He reports that  he stopped taking his medicines around mid December.  He does have a CNA that comes in twice a week to help however this has not helped with compliance.  During his visit today we discussed the importance of maintaining low blood pressure to decrease his risk of an additional stroke.  He has 1 son who lives in [place] and otherwise has no family other than cousins that are also advanced in age.  He is otherwise euvolemic on exam today and has no other cardiac complaints at this time.  Patient denies chest pain, palpitations, dyspnea, PND, orthopnea, nausea, vomiting, dizziness, syncope, edema, weight gain, or early  satiety.   Home Medications    Current Outpatient Medications  Medication Sig Dispense Refill   Apoaequorin (PREVAGEN) 10 MG CAPS Take 1 tablet by mouth daily.     aspirin EC 81 MG tablet Take 1 tablet (81 mg total) by mouth daily. Swallow whole. 30 tablet 12   atorvastatin (LIPITOR) 40 MG tablet Take 1 tablet (40 mg total) by mouth daily. 90 tablet 3   Cholecalciferol (THERA-D 2000) 50 MCG (2000 UT) TABS 1 tab by mouth once daily 90 tablet 99   Multiple Vitamins-Minerals (VITA-MIN PO) Take 1 tablet by mouth daily at 6 (six) AM. Beef Root Supplement,Pt takes 1000 mg 1 tablet daily.     Multiple Vitamins-Minerals (VITA-MIN PO) Take 1 tablet by mouth daily at 6 (six) AM. Garlic Supplement ,Pt takes 1000 mg 1 tablet daily.     sertraline (ZOLOFT) 25 MG tablet Take 1 tablet (25 mg total) by mouth daily. 90 tablet 3   tamsulosin (FLOMAX) 0.4 MG CAPS capsule Take 0.4 mg by mouth at bedtime.     triamcinolone cream (KENALOG) 0.1 % Apply 1 Application topically 2 (two) times daily as needed (dry skin).     vitamin B-12 (CYANOCOBALAMIN) 100 MCG tablet Take 1 tablet (100 mcg total) by mouth daily. 90 tablet 3   losartan (COZAAR) 100 MG tablet Take 1 tablet (100 mg total) by mouth daily. 30 tablet 1   metoprolol succinate (TOPROL XL) 25 MG 24 hr tablet Take 0.5 tablets (12.5 mg total) by mouth at bedtime. 30 tablet 1   No current facility-administered medications for this visit.     Review of Systems  Please see the history of present illness.    (+) Poor memory  All other systems reviewed and are otherwise negative except as noted above.  Physical Exam    Wt Readings from Last 3 Encounters:  05/09/22 117 lb 0.3 oz (53.1 kg)  05/07/22 115 lb (52.2 kg)  12/10/21 121 lb (54.9 kg)   VS: Vitals:   11/11/22 0949 11/11/22 1009  BP: (!) 148/98 (!) 152/96  ,Body mass index is 17.03 kg/m.  Constitutional:      Appearance: Healthy appearance. Not in distress.  Neck:     Vascular: JVD normal.   Pulmonary:     Effort: Pulmonary effort is normal.     Breath sounds: No wheezing. No rales. Diminished in the bases Cardiovascular:     Normal rate. Regular rhythm. Normal S1. Normal S2.      Murmurs: There is no murmur.  Edema:    Peripheral edema absent.  Abdominal:     Palpations: Abdomen is soft non tender. There is no hepatomegaly.  Skin:    General: Skin is warm and dry.  Neurological:     General: No focal deficit present.     Mental Status: Alert and oriented to  person, place and time.     Cranial Nerves: Cranial nerves are intact.  EKG/LABS/ Recent Cardiac Studies    ECG personally reviewed by me today -sinus rhythm with rate of 71 bpm and LVH with right atrial enlargement and no acute changes consistent with previous EKG.  Lab Results  Component Value Date   WBC 6.5 08/14/2021   HGB 13.7 08/14/2021   HCT 40.5 08/14/2021   MCV 86 08/14/2021   PLT 231 08/14/2021   Lab Results  Component Value Date   CREATININE 0.91 10/15/2021   BUN 12 10/15/2021   NA 138 10/15/2021   K 4.0 10/15/2021   CL 100 10/15/2021   CO2 27 10/15/2021   Lab Results  Component Value Date   ALT 17 07/09/2021   AST 18 07/09/2021   ALKPHOS 69 07/09/2021   BILITOT 1.0 07/09/2021   Lab Results  Component Value Date   CHOL 184 12/12/2020   HDL 75.10 12/12/2020   LDLCALC 97 12/12/2020   TRIG 58.0 12/12/2020   CHOLHDL 2 12/12/2020    Lab Results  Component Value Date   HGBA1C 5.6 06/03/2021    Cardiac Studies & Procedures       ECHOCARDIOGRAM  ECHOCARDIOGRAM COMPLETE 07/24/2021  Narrative ECHOCARDIOGRAM REPORT    Patient Name:   LINO KLUTZ [name] Date of Exam: 07/24/2021 Medical Rec #:  [id number]             Height:       70.0 in Accession #:    [id number]            Weight:       119.0 lb Date of Birth:  Apr 30, 1941            BSA:          1.674 m Patient Age:    33 years              BP:           112/60 mmHg Patient Gender: M                     HR:           78  bpm. Exam Location:  [place]  Procedure: 2D Echo  Indications:    Ventricular Tachycardia I47.2  History:        Patient has prior history of Echocardiogram examinations, most recent 10/17/2017.  Sonographer:    Mikki Santee RDCS Referring Phys: [id number] [place]   1. Left ventricular ejection fraction, by estimation, is 45 to 50%. The left ventricle has mildly decreased function. The left ventricle demonstrates regional wall motion abnormalities. Septal hypokinesis. Left ventricular diastolic parameters were normal. 2. Right ventricular systolic function is normal. The right ventricular size is mildly enlarged. There is normal pulmonary artery systolic pressure. The estimated right ventricular systolic pressure is 0000000 mmHg. 3. The mitral valve is normal in structure. No evidence of mitral valve regurgitation. No evidence of mitral stenosis. 4. The aortic valve is tricuspid. Appears partial fusion of left and noncoronary cusps. Aortic valve regurgitation is trivial. No aortic stenosis is present. 5. The inferior vena cava is normal in size with greater than 50% respiratory variability, suggesting right atrial pressure of 3 mmHg. 6. Evidence of atrial level shunting detected by color flow Doppler. Appears to be left to right flow from atria on subcostal images suggesting ASD, and RV is dilated, recommend further evaluation with either cardiac MRI,  gated CTA, or TEE  FINDINGS Left Ventricle: Left ventricular ejection fraction, by estimation, is 45 to 50%. The left ventricle has mildly decreased function. The left ventricle demonstrates regional wall motion abnormalities. The left ventricular internal cavity size was normal in size. There is no left ventricular hypertrophy. Left ventricular diastolic parameters were normal.  Right Ventricle: The right ventricular size is mildly enlarged. Right vetricular wall thickness was not well visualized. Right ventricular  systolic function is normal. There is normal pulmonary artery systolic pressure. The tricuspid regurgitant velocity is 2.49 m/s, and with an assumed right atrial pressure of 3 mmHg, the estimated right ventricular systolic pressure is 0000000 mmHg.  Left Atrium: Left atrial size was normal in size.  Right Atrium: Right atrial size was normal in size.  Pericardium: There is no evidence of pericardial effusion.  Mitral Valve: The mitral valve is normal in structure. No evidence of mitral valve regurgitation. No evidence of mitral valve stenosis.  Tricuspid Valve: The tricuspid valve is normal in structure. Tricuspid valve regurgitation is mild.  Aortic Valve: The aortic valve is tricuspid. Aortic valve regurgitation is trivial. No aortic stenosis is present.  Pulmonic Valve: The pulmonic valve was not well visualized. Pulmonic valve regurgitation is not visualized.  Aorta: The aortic root is normal in size and structure.  Venous: The inferior vena cava is normal in size with greater than 50% respiratory variability, suggesting right atrial pressure of 3 mmHg.  IAS/Shunts: Evidence of atrial level shunting detected by color flow Doppler.   LEFT VENTRICLE PLAX 2D LVIDd:         3.70 cm     Diastology LVIDs:         2.80 cm     LV e' medial:    7.18 cm/s LV PW:         0.90 cm     LV E/e' medial:  8.8 LV IVS:        1.00 cm     LV e' lateral:   15.00 cm/s LVOT diam:     2.20 cm     LV E/e' lateral: 4.2 LV SV:         47 LV SV Index:   28 LVOT Area:     3.80 cm  LV Volumes (MOD) LV vol d, MOD A2C: 57.1 ml LV vol d, MOD A4C: 47.5 ml LV vol s, MOD A2C: 28.9 ml LV vol s, MOD A4C: 26.1 ml LV SV MOD A2C:     28.2 ml LV SV MOD A4C:     47.5 ml LV SV MOD BP:      23.7 ml  RIGHT VENTRICLE RV S prime:     12.20 cm/s TAPSE (M-mode): 1.5 cm  LEFT ATRIUM           Index        RIGHT ATRIUM           Index LA diam:      1.80 cm 1.08 cm/m   RA Area:     14.40 cm LA Vol (A2C): 25.1 ml  14.99 ml/m  RA Volume:   34.30 ml  20.49 ml/m LA Vol (A4C): 10.4 ml 6.21 ml/m AORTIC VALVE LVOT Vmax:   69.70 cm/s LVOT Vmean:  41.900 cm/s LVOT VTI:    0.123 m  AORTA Ao Root diam: 3.70 cm  MITRAL VALVE               TRICUSPID VALVE MV Area (PHT): 2.50 cm  TR Peak grad:   24.8 mmHg MV Decel Time: 303 msec    TR Vmax:        249.00 cm/s MV E velocity: 63.40 cm/s MV A velocity: 60.30 cm/s  SHUNTS MV E/A ratio:  1.05        Systemic VTI:  0.12 m Systemic Diam: 2.20 cm  Oswaldo Milian MD Electronically signed by Oswaldo Milian MD Signature Date/Time: 07/24/2021/10:30:46 AM    Final   TEE  ECHO TEE 08/15/2021  Narrative TRANSESOPHOGEAL ECHO REPORT    Patient Name:   KENTON MICKLEY [name] Date of Exam: 08/15/2021 Medical Rec #:  [id number]             Height:       70.0 in Accession #:    [id number]            Weight:       124.0 lb Date of Birth:  Mar 26, 1941            BSA:          1.704 m Patient Age:    89 years              BP:           136/97 mmHg Patient Gender: M                     HR:           79 bpm. Exam Location:  Inpatient  Procedure: Transesophageal Echo, Color Doppler, Cardiac Doppler, 3D Echo and Saline Contrast Bubble Study  Indications:     Q21.1 ASD  History:         Patient has prior history of Echocardiogram examinations. Risk Factors:Hypertension and Dyslipidemia.  Sonographer:     Raquel Sarna Senior RDCS Referring Phys:  [id number] Jerline Pain Diagnosing Phys: Candee Furbish MD  PROCEDURE: After discussion of the risks and benefits of a TEE, an informed consent was obtained from the patient. The transesophogeal probe was passed without difficulty through the esophogus of the patient. Sedation performed by different physician. The patient was monitored while under deep sedation. Anesthestetic sedation was provided intravenously by Anesthesiology: 225mg  of Propofol, 40mg  of Lidocaine. The patient's vital signs; including heart rate,  blood pressure, and oxygen saturation; remained stable throughout the procedure. The patient developed no complications during the procedure.  IMPRESSIONS   1. Large Secundum atrial septal defect (ASD) with bidirectional flow, 1.7 x 1.8 cm. 2. Left ventricular ejection fraction, by estimation, is 60 to 65%. The left ventricle has normal function. The left ventricle has no regional wall motion abnormalities. 3. Right ventricular systolic function is normal. The right ventricular size is mildly enlarged. There is normal pulmonary artery systolic pressure. 4. No left atrial/left atrial appendage thrombus was detected. 5. The mitral valve is normal in structure. Trivial mitral valve regurgitation. No evidence of mitral stenosis. 6. The aortic valve is tricuspid. Aortic valve regurgitation is trivial. No aortic stenosis is present. 7. The inferior vena cava is normal in size with greater than 50% respiratory variability, suggesting right atrial pressure of 3 mmHg. 8. Evidence of atrial level shunting detected by color flow Doppler. Agitated saline contrast bubble study was positive with shunting observed within 3-6 cardiac cycles suggestive of interatrial shunt. There is a large secundum atrial septal defect with bidirectional shunting across the atrial septum.  FINDINGS Left Ventricle: Left ventricular ejection fraction, by estimation, is 60 to 65%. The left ventricle has normal function.  The left ventricle has no regional wall motion abnormalities. The left ventricular internal cavity size was normal in size. There is no left ventricular hypertrophy. The ratio of pulmonic flow to systemic flow (Qp/Qs ratio) is 1.90.  Right Ventricle: The right ventricular size is mildly enlarged. No increase in right ventricular wall thickness. Right ventricular systolic function is normal. There is normal pulmonary artery systolic pressure. The tricuspid regurgitant velocity is 2.30 m/s, and with an assumed right  atrial pressure of 3 mmHg, the estimated right ventricular systolic pressure is 53.9 mmHg.  Left Atrium: Left atrial size was normal in size. No left atrial/left atrial appendage thrombus was detected.  Right Atrium: Right atrial size was normal in size.  Pericardium: There is no evidence of pericardial effusion.  Mitral Valve: The mitral valve is normal in structure. Trivial mitral valve regurgitation. No evidence of mitral valve stenosis.  Tricuspid Valve: The tricuspid valve is normal in structure. Tricuspid valve regurgitation is mild . No evidence of tricuspid stenosis.  Aortic Valve: The aortic valve is tricuspid. Aortic valve regurgitation is trivial. No aortic stenosis is present.  Pulmonic Valve: The pulmonic valve was normal in structure. Pulmonic valve regurgitation is not visualized. No evidence of pulmonic stenosis.  Aorta: The aortic root is normal in size and structure.  Venous: The inferior vena cava is normal in size with greater than 50% respiratory variability, suggesting right atrial pressure of 3 mmHg.  IAS/Shunts: Evidence of atrial level shunting detected by color flow Doppler. Agitated saline contrast was given intravenously to evaluate for intracardiac shunting. Agitated saline contrast bubble study was positive with shunting observed within 3-6 cardiac cycles suggestive of interatrial shunt. There is a large secundum atrial septal defect with bidirectional shunting across the atrial septum. The ratio of pulmonic flow to systemic flow (Qp/Qs ratio) is 1.90.  Additional Comments: Large Secundum atrial septal defect (ASD) with bidirectional flow, 1.7 x 1.8 cm.   LEFT VENTRICLE PLAX 2D LVOT diam:     2.20 cm LV SV:         57 LV SV Index:   33 LVOT Area:     3.80 cm   RIGHT VENTRICLE RVOT diam:      2.90 cm  AORTIC VALVE             PULMONIC VALVE LVOT Vmax:   88.30 cm/s  RVOT Peak grad: 2 mmHg LVOT Vmean:  52.700 cm/s LVOT VTI:    0.149 m  TRICUSPID  VALVE TR Peak grad:   21.2 mmHg TR Vmax:        230.00 cm/s  SHUNTS Systemic VTI:  0.15 m Systemic Diam: 2.20 cm Pulmonic VTI:  0.162 m Pulmonic Diam: 2.90 cm Qp/Qs:         1.89  Candee Furbish MD Electronically signed by Candee Furbish MD Signature Date/Time: 08/15/2021/3:06:55 PM    Final   MONITORS  CARDIAC EVENT MONITOR 07/05/2021  Narrative The patient's monitoring period was 06/12/2021 - 07/02/2021. Baseline sample showed Sinus Rhythm with a heart rate of 87.7 bpm. There were 2 critical, 0 serious, and 10 stable events that occurred. The report analysis of the critical, serious, stable and manually triggered events are listed below. Manually Detected Events: 1 Stable: Sinus Rhythm w/PVCs (3 in 1 min)  None or Accidental Push 1 Stable: Sinus Rhythm  Flutter or Skipped Beats 4 Stable: Sinus Rhythm  None or Accidental Push 1 Stable: Sinus Rhythm  None or Accidental Push; Tired or Fatigued 1 Stable: Sinus Rhythm  w/Artifact  None or Accidental Push  The patient has been referred and is undergoing Cardiology evaluation.   CT SCANS  CT CORONARY MORPH W/CTA COR W/SCORE 10/22/2021  Addendum 10/22/2021  8:35 PM ADDENDUM REPORT: 10/22/2021 20:32  CLINICAL DATA:  This is a 58 old year old male with chest pain and history of Secundum ASD.  EXAM: Cardiac/Coronary  CTA  TECHNIQUE: The patient was scanned on a [employer].  FINDINGS: A 100 kV prospective scan was triggered in the descending thoracic aorta at 111 HU's. Axial non-contrast 3 mm slices were carried out through the heart. The data set was analyzed on a dedicated work station and scored using the Donna. Gantry rotation speed was 250 msecs and collimation was .6 mm. No beta blockade and 0.8 mg of sl NTG was given. The 3D data set was reconstructed in 5% intervals of the 67-82 % of the R-R cycle. Diastolic phases were analyzed on a dedicated work station using MPR, MIP and VRT modes. The  patient received 80 cc of contrast.  Aorta: Normal size. Mild aortic root calcifications. No dissection.  Aortic Valve:  Trileaflet.  No calcifications.  Coronary Arteries:  Normal coronary origin.  Right dominance.  RCA is a large dominant artery that gives rise to PDA and PLA. There is no plaque.  Left main is a large artery that gives rise to LAD and LCX arteries.  LAD is a large torturous vessel. The proximal LAD with mild (25-49%). The mid and distal LAD with no plaques. D1 with minimal calcification in the mid portion of the vessel.  LCX is a non-dominant artery that gives rise to one large OM1 branch. There is no plaque  Coronary Calcium Score:  Left main: 0  Left anterior descending artery: 127  Left circumflex artery: 0  Right coronary artery: 0  Total: 127  Percentile: 52  Other findings:  There is a evidence of a large secundum ASD (19.4 mm) with left to right flow.  The right atrium and right ventricle are dilated.  Normal pulmonary vein drainage into the left atrium.  Normal left atrial appendage without a thrombus.  Normal size of the pulmonary artery.  IMPRESSION: 1. Coronary calcium score of 127. This was 44 percentile for age and sex matched control.  2. Normal coronary origin with right dominance.  3. CAD-RADS 2. Mild non-obstructive CAD (25-49%). Consider non-atherosclerotic causes of chest pain. Consider preventive therapy and risk factor modification.  4. Large Secundum ASD noted with dilatation of the right atrium and the right ventricle.   Electronically Signed By: Berniece Salines D.O. On: 10/22/2021 20:32  Narrative EXAM: OVER-READ INTERPRETATION  CT CHEST  The following report is an over-read performed by radiologist Dr. Markus Daft of [facility], [place] on 10/22/2021. This over-read does not include interpretation of cardiac or coronary anatomy or pathology. The cardiac CTA interpretation by the cardiologist  is attached.  COMPARISON:  12/27/2015  FINDINGS: Vascular: Mild atherosclerotic disease in the descending thoracic aorta.  Mediastinum/Nodes: Visualized mediastinal structures are normal.  Lungs/Pleura: Evidence for chronic changes particularly in the anterior left upper lung. Findings could represent paraseptal emphysema. Patchy peripheral densities in lower lobes are nonspecific and could represent atelectasis versus mild fibrotic changes.  Upper Abdomen: Limited evaluation of the upper abdomen.  Musculoskeletal: Again noted are deformity of left lateral ribs.  IMPRESSION: 1. No acute extracardiac findings. 2. Chronic lung changes and suspect areas of paraseptal emphysema and difficult to exclude mild fibrotic changes.  Electronically Signed: By: Richarda Overlie M.D. On: 10/22/2021 11:46          Assessment & Plan    1.  Coronary artery disease: -Patient had coronary calcifications noted on recent CT scan -Patient has history of ASD that was evaluated by structural heart team and is currently being treated medically. -Today patient reports no chest pain or shortness of breath since previous visit. -He has been noncompliant with his medications be restarted on Toprol-XL 12.5 mg daily -Continue current GDMT with ASA 81 mg and atorvastatin 40 mg daily   2.  PAD: -Patient evaluated by Dr. Kirke Corin and found to have occluded right popliteal artery and occluded distal SFA.  He is currently being treated medically at this time. -Today patient denies any claudication and reports that he is in the process of quitting smoking and is down to 2 to 3 cigarettes/day.     3.  History of CVA: -Continue ASA 81 mg and Lipitor 40 mg as noted above -Patient has ASD and elected to treat medically due to comorbidities and age   13.  Hypertension: HYPERTENSION CONTROL Vitals:   11/11/22 0949 11/11/22 1009  BP: (!) 148/98 (!) 152/96    The patient's blood pressure is elevated above target  today.  In order to address the patient's elevated BP: Blood pressure will be monitored at home to determine if medication changes need to be made.; A current anti-hypertensive medication was adjusted today.     -We will restart losartan 100 mg daily and Toprol-XL 12.5 mg daily -We will check BMET today and BMET in 2 weeks  5.  History of NSVT:  The patient reports no recurrence of palpitations -We will restart on Toprol 12.5 mg daily today  6.  Poor medication compliance: -Patient recently discontinued his HCTZ, Toprol-XL, and losartan due to memory deficit and polypharmacy. -We will refer to social work to see if patient is eligible for home medication management assistance.  7.  Intermittent memory loss: -Patient has history of multiple past strokes and has intermittent memory loss.  He recently self discontinued his Aricept 10 mg along with several other medications. He is unaware of current dosages or regimens related to his medications. -We will refer him to neurology for assistance in management of his memory deficit.     Disposition: Follow-up with [name], MD or APP in 1 months    Medication Adjustments/Labs and Tests Ordered: Current medicines are reviewed at length with the patient today.  Concerns regarding medicines are outlined above.   Signed, Napoleon Form, Leodis Rains, NP 11/11/2022, 10:17 AM [facility]  Note:  This document was prepared using Dragon voice recognition software and may include unintentional dictation errors.

## 2022-11-11 ENCOUNTER — Telehealth (HOSPITAL_COMMUNITY): Payer: Self-pay | Admitting: Licensed Clinical Social Worker

## 2022-11-11 ENCOUNTER — Encounter: Payer: Self-pay | Admitting: Nurse Practitioner

## 2022-11-11 ENCOUNTER — Ambulatory Visit: Payer: 59 | Attending: Nurse Practitioner | Admitting: Nurse Practitioner

## 2022-11-11 VITALS — BP 152/96 | Ht 69.5 in

## 2022-11-11 DIAGNOSIS — I2510 Atherosclerotic heart disease of native coronary artery without angina pectoris: Secondary | ICD-10-CM

## 2022-11-11 DIAGNOSIS — R413 Other amnesia: Secondary | ICD-10-CM

## 2022-11-11 DIAGNOSIS — I739 Peripheral vascular disease, unspecified: Secondary | ICD-10-CM | POA: Diagnosis not present

## 2022-11-11 DIAGNOSIS — Z8673 Personal history of transient ischemic attack (TIA), and cerebral infarction without residual deficits: Secondary | ICD-10-CM

## 2022-11-11 DIAGNOSIS — I4729 Other ventricular tachycardia: Secondary | ICD-10-CM | POA: Diagnosis not present

## 2022-11-11 DIAGNOSIS — Z91148 Patient's other noncompliance with medication regimen for other reason: Secondary | ICD-10-CM

## 2022-11-11 DIAGNOSIS — I10 Essential (primary) hypertension: Secondary | ICD-10-CM

## 2022-11-11 MED ORDER — LOSARTAN POTASSIUM 100 MG PO TABS: 100.0000 mg | ORAL_TABLET | Freq: Every day | ORAL | 1 refills | Status: DC

## 2022-11-11 MED ORDER — METOPROLOL SUCCINATE ER 25 MG PO TB24: 12.5000 mg | ORAL_TABLET | Freq: Every day | ORAL | 1 refills | Status: DC

## 2022-11-11 NOTE — Telephone Encounter (Signed)
CSW consulted to speak with pt regarding medication compliance and need for additional services in the home.  CSW attempted to call pt to discuss- unable to reach- left VM requesting return call  Jorge Ny, [place] [facility] Desk#: [phone] Cell#: [phone]

## 2022-11-11 NOTE — Progress Notes (Signed)
Patient stopped previous medications and will be restarted on Toprol-XL and losartan 100 mg today

## 2022-11-11 NOTE — Patient Instructions (Addendum)
Medication Instructions:  RESTART Toprol XL-12.5mg  Take 1 tablet once a day RESTART Losartan 100mg  Take 1 tablet once a day *If you need a refill on your cardiac medications before your next appointment, please call your pharmacy*   Lab Work: TODAY BMET 2 WEEKS BMET If you have labs (blood work) drawn today and your tests are completely normal, you will receive your results only by: [place] (if you have MyChart) OR A paper copy in the mail If you have any lab test that is abnormal or we need to change your treatment, we will call you to review the results.   Testing/Procedures: None ordered   Follow-Up: At [facility], you and your health needs are our priority.  As part of our continuing mission to provide you with exceptional heart care, we have created designated Provider Care Teams.  These Care Teams include your primary Cardiologist (physician) and Advanced Practice Providers (APPs -  Physician Assistants and Nurse Practitioners) who all work together to provide you with the care you need, when you need it.  We recommend signing up for the patient portal called "MyChart".  Sign up information is provided on this After Visit Summary.  MyChart is used to connect with patients for Virtual Visits (Telemedicine).  Patients are able to view lab/test results, encounter notes, upcoming appointments, etc.  Non-urgent messages can be sent to your provider as well.   To learn more about what you can do with MyChart, go to [email or web address].    Your next appointment:   1 month(s)  Provider:   Ambrose Pancoast, NP       Other Instructions You have been referred to Neurology. (Dr Tomi Likens) Please check your blood pressure daily and record your readings.

## 2022-11-12 ENCOUNTER — Telehealth (HOSPITAL_COMMUNITY): Payer: Self-pay | Admitting: Licensed Clinical Social Worker

## 2022-11-12 DIAGNOSIS — I10 Essential (primary) hypertension: Secondary | ICD-10-CM

## 2022-11-12 LAB — BASIC METABOLIC PANEL
BUN/Creatinine Ratio: 6 — ABNORMAL LOW (ref 10–24)
BUN: 6 mg/dL — ABNORMAL LOW (ref 8–27)
CO2: 24 mmol/L (ref 20–29)
Calcium: 9.3 mg/dL (ref 8.6–10.2)
Chloride: 102 mmol/L (ref 96–106)
Creatinine, Ser: 0.95 mg/dL (ref 0.76–1.27)
Glucose: 86 mg/dL (ref 70–99)
Potassium: 4.4 mmol/L (ref 3.5–5.2)
Sodium: 143 mmol/L (ref 134–144)
eGFR: 81 mL/min/{1.73_m2} (ref >59)

## 2022-11-12 NOTE — Telephone Encounter (Signed)
H&V Care Navigation CSW Progress Note  Clinical Social Worker spoke with pt regarding current medication management concerns.  Pt admits to having trouble keeping up with his meds- states he lost two of his medication today that he had just picked up from the pharmacy.  Doesn't have anyone at home and states he doesn't have any friends or family nearby that can help him.  Has an aid through his medicaid that comes 2x week for 2 hours- would liked this increased- CSW will attempt to submit hours increase request to assist.  CSW placed consult to [facility] to help figure out his medications if able.    SDOH Screenings   Food Insecurity: No Food Insecurity (04/09/2022)  Housing: Low Risk  (04/07/2022)  Transportation Needs: No Transportation Needs (04/09/2022)  Alcohol Screen: Low Risk  (04/07/2022)  Depression (PHQ2-9): Low Risk  (05/07/2022)  Financial Resource Strain: Low Risk  (04/07/2022)  Physical Activity: Inactive (04/07/2022)  Social Connections: Patient Declined (04/07/2022)  Stress: No Stress Concern Present (04/07/2022)  Tobacco Use: Medium Risk (11/11/2022)     Roberto Wallace, [facility] Desk#: [phone] Cell#: [phone]

## 2022-11-13 ENCOUNTER — Telehealth: Payer: Self-pay

## 2022-11-13 NOTE — Progress Notes (Signed)
   Care Guide Note  11/13/2022 Name: Roberto BURLINGAME [name] MRN: [id number] DOB: 09-02-1940  Referred by: [name], MD Reason for referral : No chief complaint on file.   Roberto Gourd [name] is a 82 y.o. year old male who is a primary care patient of [name], MD. Roberto Gourd [name] was referred to the pharmacist for assistance related to HTN.    Successful contact was made with the patient to discuss pharmacy services including being ready for the pharmacist to call at least 5 minutes before the scheduled appointment time, to have medication bottles and any blood sugar or blood pressure readings ready for review. The patient agreed to meet with the pharmacist via with the pharmacist via telephone visit on (date/time).  11/21/2022  Noreene Larsson, [place], [place] [address] Direct Dial: [phone] [email or web address]

## 2022-11-18 ENCOUNTER — Encounter: Payer: Self-pay | Admitting: Physician Assistant

## 2022-11-19 ENCOUNTER — Telehealth (HOSPITAL_COMMUNITY): Payer: Self-pay | Admitting: Licensed Clinical Social Worker

## 2022-11-19 NOTE — Telephone Encounter (Signed)
H&V Care Navigation CSW Progress Note  Clinical Social Worker completed PCS hours increase form and gave to provider for review and completion- will help follow up and send to [employer] for review when done.   SDOH Screenings   Food Insecurity: No Food Insecurity (04/09/2022)  Housing: Low Risk  (04/07/2022)  Transportation Needs: No Transportation Needs (04/09/2022)  Alcohol Screen: Low Risk  (04/07/2022)  Depression (PHQ2-9): Low Risk  (05/07/2022)  Financial Resource Strain: Low Risk  (04/07/2022)  Physical Activity: Inactive (04/07/2022)  Social Connections: Patient Declined (04/07/2022)  Stress: No Stress Concern Present (04/07/2022)  Tobacco Use: Medium Risk (11/11/2022)    Jorge Ny, [place] Desk#: [phone] Cell#: [phone]

## 2022-11-21 ENCOUNTER — Other Ambulatory Visit: Payer: 59

## 2022-11-21 ENCOUNTER — Telehealth: Payer: Self-pay

## 2022-11-21 NOTE — Progress Notes (Signed)
   11/21/2022  Patient ID: Roberto Wallace., male   DOB: [birth year], 82 y.o.   MRN: [id number]  Subjective/Objective: Telephone visit for medication management after referral placed by social work Medication Management -Patient recently seen by Dr. Barbarann Ehlers in cardiology, and it was discovered he had recently stopped taking medications -Losartan and Toprol were restarted at visit; but patient endorsed after picking these up, he lost the medications -Roberto Wallace called in response to voicemails I left earlier in the morning for his scheduled telephone visit -Patient was very distracted and somewhat uncooperative, stating he had to get furniture moved into his new apartment and didn't have time to worry about his medications.  He stated his medications were not going to help get furniture moved, and he did not wish to handle this over the phone -I was able to confirm he did not have his losartan or metoprolol, and he agreed for me to call [facility]  to see if prescriptions could be refilled for him -Offered pharmacy services through [employer] that would do adherence packaging and home delivery, but he declined  Assessment/Plan:  Medication Management -Discussed importance of taking prescribed blood pressure medications daily -Contacted [facility], and insurance will not pay for prescriptions since they were just filled 3/19 but pharmacy contacted insurance company to request refill too soon override, which was approved.  Prescriptions refilled and ready for pick up at no charge. -Attempted to reach patient to inform but did not get an answer.  Left a general message stating his medications we discussed were ready for pick up at no charge.  Follow-up:  Forwarding note to Dr. Barbarann Ehlers and Roberto Sours, LCSW to offer in-person appointment with patient when they next see him.  He is averse to telephone appointments.  Darlina Guys, PharmD, DPLA

## 2022-11-21 NOTE — Progress Notes (Signed)
   11/21/2022  Patient ID: Cristela Felt., male   DOB: 01-Mar-1941, 82 y.o.   MRN: [id number]  Outreach attempt for scheduled telephone visit to discuss medication management unsuccessful.  Called and left voicemail x2; left my direct number for patient to call back at his convenience to reschedule.  I will also attempt to contact him again next week.  Darlina Guys, PharmD, DPLA

## 2022-11-24 ENCOUNTER — Other Ambulatory Visit: Payer: Self-pay

## 2022-11-24 MED ORDER — LOSARTAN POTASSIUM 100 MG PO TABS: 100.0000 mg | ORAL_TABLET | Freq: Every day | ORAL | 3 refills | Status: DC

## 2022-11-25 ENCOUNTER — Ambulatory Visit: Payer: 59 | Attending: Nurse Practitioner

## 2022-11-25 DIAGNOSIS — I10 Essential (primary) hypertension: Secondary | ICD-10-CM | POA: Diagnosis not present

## 2022-11-25 DIAGNOSIS — Z8673 Personal history of transient ischemic attack (TIA), and cerebral infarction without residual deficits: Secondary | ICD-10-CM | POA: Diagnosis not present

## 2022-11-25 DIAGNOSIS — I2510 Atherosclerotic heart disease of native coronary artery without angina pectoris: Secondary | ICD-10-CM

## 2022-11-25 DIAGNOSIS — I4729 Other ventricular tachycardia: Secondary | ICD-10-CM | POA: Diagnosis not present

## 2022-11-25 DIAGNOSIS — I739 Peripheral vascular disease, unspecified: Secondary | ICD-10-CM | POA: Diagnosis not present

## 2022-11-26 LAB — BASIC METABOLIC PANEL
BUN/Creatinine Ratio: 11 (ref 10–24)
BUN: 10 mg/dL (ref 8–27)
CO2: 24 mmol/L (ref 20–29)
Calcium: 9.6 mg/dL (ref 8.6–10.2)
Chloride: 102 mmol/L (ref 96–106)
Creatinine, Ser: 0.89 mg/dL (ref 0.76–1.27)
Glucose: 90 mg/dL (ref 70–99)
Potassium: 4.3 mmol/L (ref 3.5–5.2)
Sodium: 143 mmol/L (ref 134–144)
eGFR: 87 mL/min/{1.73_m2} (ref >59)

## 2022-12-01 ENCOUNTER — Telehealth (HOSPITAL_COMMUNITY): Payer: Self-pay | Admitting: Licensed Clinical Social Worker

## 2022-12-01 NOTE — Telephone Encounter (Signed)
H&V Care Navigation CSW Progress Note  Clinical Social Worker faxed in request for PCS hours increase to [facility] for review. Fax confirmation received.  Patient is participating in a Managed Medicaid Plan:  Yes  SDOH Screenings   Food Insecurity: No Food Insecurity (04/09/2022)  Housing: Low Risk  (04/07/2022)  Transportation Needs: No Transportation Needs (04/09/2022)  Alcohol Screen: Low Risk  (04/07/2022)  Depression (PHQ2-9): Low Risk  (05/07/2022)  Financial Resource Strain: Low Risk  (04/07/2022)  Physical Activity: Inactive (04/07/2022)  Social Connections: Patient Declined (04/07/2022)  Stress: No Stress Concern Present (04/07/2022)  Tobacco Use: Medium Risk (11/11/2022)   Burna Sis, LCSW Clinical Social Worker [facility] Desk#: [phone] Cell#: [phone]

## 2022-12-08 ENCOUNTER — Telehealth (HOSPITAL_COMMUNITY): Payer: Self-pay | Admitting: Licensed Clinical Social Worker

## 2022-12-08 NOTE — Telephone Encounter (Signed)
H&V Care Navigation CSW Progress Note  Clinical Social Worker received notice back from [employer] that pt does not have qualifying Medicaid to receive PCS services through his benefits so unable to get hours increase at this time.   SDOH Screenings   Food Insecurity: No Food Insecurity (04/09/2022)  Housing: Low Risk  (04/07/2022)  Transportation Needs: No Transportation Needs (04/09/2022)  Alcohol Screen: Low Risk  (04/07/2022)  Depression (PHQ2-9): Low Risk  (05/07/2022)  Financial Resource Strain: Low Risk  (04/07/2022)  Physical Activity: Inactive (04/07/2022)  Social Connections: Patient Declined (04/07/2022)  Stress: No Stress Concern Present (04/07/2022)  Tobacco Use: Medium Risk (11/11/2022)    Burna Sis, LCSW Clinical Social Worker [facility] Desk#: [phone] Cell#: [phone]

## 2022-12-09 ENCOUNTER — Ambulatory Visit (INDEPENDENT_AMBULATORY_CARE_PROVIDER_SITE_OTHER): Payer: 59 | Admitting: Physician Assistant

## 2022-12-09 ENCOUNTER — Encounter: Payer: Self-pay | Admitting: Physician Assistant

## 2022-12-09 ENCOUNTER — Other Ambulatory Visit (INDEPENDENT_AMBULATORY_CARE_PROVIDER_SITE_OTHER): Payer: 59

## 2022-12-09 VITALS — BP 151/93 | HR 73 | Resp 18 | Ht 69.5 in | Wt 119.0 lb

## 2022-12-09 DIAGNOSIS — R413 Other amnesia: Secondary | ICD-10-CM | POA: Diagnosis not present

## 2022-12-09 DIAGNOSIS — Z8619 Personal history of other infectious and parasitic diseases: Secondary | ICD-10-CM | POA: Diagnosis not present

## 2022-12-09 LAB — CBC
HCT: 42.5 % (ref 39.0–52.0)
Hemoglobin: 13.9 g/dL (ref 13.0–17.0)
MCHC: 32.8 g/dL (ref 30.0–36.0)
MCV: 87.8 fl (ref 78.0–100.0)
Platelets: 222 10*3/uL (ref 150.0–400.0)
RBC: 4.85 Mil/uL (ref 4.22–5.81)
RDW: 13.9 % (ref 11.5–15.5)
WBC: 6.4 10*3/uL (ref 4.0–10.5)

## 2022-12-09 LAB — TSH: TSH: 1.06 u[IU]/mL (ref 0.35–5.50)

## 2022-12-09 MED ORDER — MEMANTINE HCL 5 MG PO TABS: 5.0000 mg | ORAL_TABLET | Freq: Every evening | ORAL | 11 refills | Status: DC

## 2022-12-09 MED ORDER — MEMANTINE HCL 5 MG PO TABS: ORAL_TABLET | ORAL | 11 refills | Status: DC

## 2022-12-09 NOTE — Patient Instructions (Addendum)
It was a pleasure to see you today at our office.   Recommendations:  Neurocognitive evaluation at our office   MRI of the brain, the radiology office will call you to arrange you appointment   Check labs today   Follow up in 3 months  Recommend psychotherapy on PTSD  Start memantine 5 mg at night for 2 weeks then increase to 5 mg 2 times a day    RECOMMENDATIONS FOR ALL PATIENTS WITH MEMORY PROBLEMS: 1. Continue to exercise (Recommend 30 minutes of walking everyday, or 3 hours every week) 2. Increase social interactions - continue going to [place] and enjoy social gatherings with friends and family 3. Eat healthy, avoid fried foods and eat more fruits and vegetables 4. Maintain adequate blood pressure, blood sugar, and blood cholesterol level. Reducing the risk of stroke and cardiovascular disease also helps promoting better memory. 5. Avoid stressful situations. Live a simple life and avoid aggravations. Organize your time and prepare for the next day in anticipation. 6. Sleep well, avoid any interruptions of sleep and avoid any distractions in the bedroom that may interfere with adequate sleep quality 7. Avoid sugar, avoid sweets as there is a strong link between excessive sugar intake, diabetes, and cognitive impairment We discussed the Mediterranean diet, which has been shown to help patients reduce the risk of progressive memory disorders and reduces cardiovascular risk. This includes eating fish, eat fruits and green leafy vegetables, nuts like almonds and hazelnuts, walnuts, and also use olive oil. Avoid fast foods and fried foods as much as possible. Avoid sweets and sugar as sugar use has been linked to worsening of memory function.  There is always a concern of gradual progression of memory problems. If this is the case, then we may need to adjust level of care according to patient needs. Support, both to the patient and caregiver, should then be put into place.      You have  been referred for a neuropsychological evaluation (i.e., evaluation of memory and thinking abilities). Please bring someone with you to this appointment if possible, as it is helpful for the doctor to hear from both you and another adult who knows you well. Please bring eyeglasses and hearing aids if you wear them.    The evaluation will take approximately 3 hours and has two parts:   The first part is a clinical interview with the neuropsychologist (Dr. Milbert Coulter or Dr. Roseanne Reno). During the interview, the neuropsychologist will speak with you and the individual you brought to the appointment.    The second part of the evaluation is testing with the doctor's technician Annabelle Harman or Selena Batten). During the testing, the technician will ask you to remember different types of material, solve problems, and answer some questionnaires. Your family member will not be present for this portion of the evaluation.   Please note: We must reserve several hours of the neuropsychologist's time and the psychometrician's time for your evaluation appointment. As such, there is a No-Show fee of $100. If you are unable to attend any of your appointments, please contact our office as soon as possible to reschedule.    FALL PRECAUTIONS: Be cautious when walking. Scan the area for obstacles that may increase the risk of trips and falls. When getting up in the mornings, sit up at the edge of the bed for a few minutes before getting out of bed. Consider elevating the bed at the head end to avoid drop of blood pressure when getting up. Walk always in  a well-lit room (use night lights in the walls). Avoid area rugs or power cords from appliances in the middle of the walkways. Use a walker or a cane if necessary and consider physical therapy for balance exercise. Get your eyesight checked regularly.  FINANCIAL OVERSIGHT: Supervision, especially oversight when making financial decisions or transactions is also recommended.  HOME SAFETY: Consider  the safety of the kitchen when operating appliances like stoves, microwave oven, and blender. Consider having supervision and share cooking responsibilities until no longer able to participate in those. Accidents with firearms and other hazards in the house should be identified and addressed as well.   ABILITY TO BE LEFT ALONE: If patient is unable to contact 911 operator, consider using LifeLine, or when the need is there, arrange for someone to stay with patients. Smoking is a fire hazard, consider supervision or cessation. Risk of wandering should be assessed by caregiver and if detected at any point, supervision and safe proof recommendations should be instituted.  MEDICATION SUPERVISION: Inability to self-administer medication needs to be constantly addressed. Implement a mechanism to ensure safe administration of the medications.   DRIVING: Regarding driving, in patients with progressive memory problems, driving will be impaired. We advise to have someone else do the driving if trouble finding directions or if minor accidents are reported. Independent driving assessment is available to determine safety of driving.   If you are interested in the driving assessment, you can contact the following:  [employer] in [place] [phone]  [facility] [phone]  [facility] [phone] or [phone]    Mediterranean Diet A Mediterranean diet refers to food and lifestyle choices that are based on the traditions of countries located on the [employer]. This way of eating has been shown to help prevent certain conditions and improve outcomes for people who have chronic diseases, like kidney disease and heart disease. What are tips for following this plan? Lifestyle  Cook and eat meals together with your family, when possible. Drink enough fluid to keep your urine clear or pale yellow. Be physically active  every day. This includes: Aerobic exercise like running or swimming. Leisure activities like gardening, walking, or housework. Get 7-8 hours of sleep each night. If recommended by your health care provider, drink red wine in moderation. This means 1 glass a day for nonpregnant women and 2 glasses a day for men. A glass of wine equals 5 oz (150 mL). Reading food labels  Check the serving size of packaged foods. For foods such as rice and pasta, the serving size refers to the amount of cooked product, not dry. Check the total fat in packaged foods. Avoid foods that have saturated fat or trans fats. Check the ingredients list for added sugars, such as corn syrup. Shopping  At the grocery store, buy most of your food from the areas near the walls of the store. This includes: Fresh fruits and vegetables (produce). Grains, beans, nuts, and seeds. Some of these may be available in unpackaged forms or large amounts (in bulk). Fresh seafood. Poultry and eggs. Low-fat dairy products. Buy whole ingredients instead of prepackaged foods. Buy fresh fruits and vegetables in-season from local farmers markets. Buy frozen fruits and vegetables in resealable bags. If you do not have access to quality fresh seafood, buy precooked frozen shrimp or canned fish, such as tuna, salmon, or sardines. Buy small amounts of raw or cooked vegetables, salads, or olives from the deli or salad bar  at your store. Stock your pantry so you always have certain foods on hand, such as olive oil, canned tuna, canned tomatoes, rice, pasta, and beans. Cooking  Cook foods with extra-virgin olive oil instead of using butter or other vegetable oils. Have meat as a side dish, and have vegetables or grains as your main dish. This means having meat in small portions or adding small amounts of meat to foods like pasta or stew. Use beans or vegetables instead of meat in common dishes like chili or lasagna. Experiment with different cooking  methods. Try roasting or broiling vegetables instead of steaming or sauteing them. Add frozen vegetables to soups, stews, pasta, or rice. Add nuts or seeds for added healthy fat at each meal. You can add these to yogurt, salads, or vegetable dishes. Marinate fish or vegetables using olive oil, lemon juice, garlic, and fresh herbs. Meal planning  Plan to eat 1 vegetarian meal one day each week. Try to work up to 2 vegetarian meals, if possible. Eat seafood 2 or more times a week. Have healthy snacks readily available, such as: Vegetable sticks with hummus. Greek yogurt. Fruit and nut trail mix. Eat balanced meals throughout the week. This includes: Fruit: 2-3 servings a day Vegetables: 4-5 servings a day Low-fat dairy: 2 servings a day Fish, poultry, or lean meat: 1 serving a day Beans and legumes: 2 or more servings a week Nuts and seeds: 1-2 servings a day Whole grains: 6-8 servings a day Extra-virgin olive oil: 3-4 servings a day Limit red meat and sweets to only a few servings a month What are my food choices? Mediterranean diet Recommended Grains: Whole-grain pasta. Brown rice. Bulgar wheat. Polenta. Couscous. Whole-wheat bread. Orpah Cobb. Vegetables: Artichokes. Beets. Broccoli. Cabbage. Carrots. Eggplant. Green beans. Chard. Kale. Spinach. Onions. Leeks. Peas. Squash. Tomatoes. Peppers. Radishes. Fruits: Apples. Apricots. Avocado. Berries. Bananas. Cherries. Dates. Figs. Grapes. Lemons. Melon. Oranges. Peaches. Plums. Pomegranate. Meats and other protein foods: Beans. Almonds. Sunflower seeds. Pine nuts. Peanuts. Cod. Salmon. Scallops. Shrimp. Tuna. Tilapia. Clams. Oysters. Eggs. Dairy: Low-fat milk. Cheese. Greek yogurt. Beverages: Water. Red wine. Herbal tea. Fats and oils: Extra virgin olive oil. Avocado oil. Grape seed oil. Sweets and desserts: [place] yogurt with honey. Baked apples. Poached pears. Trail mix. Seasoning and other foods: Basil. Cilantro. Coriander.  Cumin. Mint. Parsley. Sage. Rosemary. Tarragon. Garlic. Oregano. Thyme. Pepper. Balsalmic vinegar. Tahini. Hummus. Tomato sauce. Olives. Mushrooms. Limit these Grains: Prepackaged pasta or rice dishes. Prepackaged cereal with added sugar. Vegetables: Deep fried potatoes (french fries). Fruits: Fruit canned in syrup. Meats and other protein foods: Beef. Pork. Lamb. Poultry with skin. Hot dogs. Tomasa Blase. Dairy: Ice cream. Sour cream. Whole milk. Beverages: Juice. Sugar-sweetened soft drinks. Beer. Liquor and spirits. Fats and oils: Butter. Canola oil. Vegetable oil. Beef fat (tallow). Lard. Sweets and desserts: Cookies. Cakes. Pies. Candy. Seasoning and other foods: Mayonnaise. Premade sauces and marinades. The items listed may not be a complete list. Talk with your dietitian about what dietary choices are right for you. Summary The Mediterranean diet includes both food and lifestyle choices. Eat a variety of fresh fruits and vegetables, beans, nuts, seeds, and whole grains. Limit the amount of red meat and sweets that you eat. Talk with your health care provider about whether it is safe for you to drink red wine in moderation. This means 1 glass a day for nonpregnant women and 2 glasses a day for men. A glass of wine equals 5 oz (150 mL). This information is not intended  to replace advice given to you by your health care provider. Make sure you discuss any questions you have with your health care provider. Document Released: 04/03/2016 Document Revised: 05/06/2016 Document Reviewed: 04/03/2016 Elsevier Interactive Patient Education  2017 [employer].
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
## 2022-12-10 LAB — RPR: RPR Ser Ql: NONREACTIVE

## 2022-12-10 NOTE — Progress Notes (Unsigned)
Office Visit    Patient Name: Roberto SAVOCA [name] Date of Encounter: 12/10/2022  Primary Care Provider:  Corwin Levins, MD Primary Cardiologist:  [name], MD Primary Electrophysiologist: None  Chief Complaint   Roberto Hatchet [name] is a 82 y.o. male with PMH of NSVT, CVA, HTN, HLD, tobacco abuse, PAD who presents today for follow-up of hypertension.  Past Medical History    Past Medical History:  Diagnosis Date   Aneurysm    per pt,in head   Arthritis    Chicken pox    Depression    Hypercholesteremia    Hypertension    Migraine    PTSD (post-traumatic stress disorder) 06/24/2017   Stroke    No residual effects   Syphilis    Tuberculosis    Vitamin D deficiency    Past Surgical History:  Procedure Laterality Date   BUBBLE STUDY  08/15/2021   Procedure: BUBBLE STUDY;  Surgeon: Jake Bathe, MD;  Location: [facility] ENDOSCOPY;  Service: Cardiovascular;;   COLONOSCOPY     HERNIA REPAIR Right    x 2   TEE WITHOUT CARDIOVERSION N/A 08/15/2021   Procedure: TRANSESOPHAGEAL ECHOCARDIOGRAM (TEE);  Surgeon: Jake Bathe, MD;  Location: [facility] ENDOSCOPY;  Service: Cardiovascular;  Laterality: N/A;    Allergies  Allergies  Allergen Reactions   Pollen Extract     Other reaction(s): Sinusitis   Amlodipine Rash   Brinzolamide-Brimonidine Other (See Comments)    History of Present Illness    Roberto Pharo.  is a 82 year old male with the above mention past medical history who presents today for 1 month follow-up of hypertension.  Mr. Dulude was initially seen in 2022 by Dr.Thukkani for evaluation of NSVT and presyncope.  Patient had witnessed event after using the restroom.  EKG and carotid Dopplers were both unremarkable.  ZIO monitor was placed that showed nonsustained VT with a 3 beat run.  He was started on Toprol XL and 2D echo was obtained that showed EF 45-50%, LV WMAs, mild RVE, and evidence of atrial level shunting detected by color flow doppler.   Patient had follow-up TEE in 07/2021 that revealed ASD.  Patient was referred to structural heart team for further evaluation.  Mr. Vangorder was referred to Dr. Kirke Corin for intermittent claudication.  He underwent vascular studies that revealed ABI of 0.72 on the right and 0.7 on the left.  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  Patient is wheelchair-bound and has no evidence of critical limb ischemia.  Medical therapy was recommended instead of revascularization. Mr. Basilio was last seen on 11/2021.  During visit patient was informed that ASD closure would not be an option he agreed with nonsurgical management.  Patient was also reaching out to the [place] regarding evaluation of AAA. He completed a U/S that revealed no evidence of AAA.  He  He was seen in follow-up 05/09/2022 and was doing well however he was still smoking.  Patient's blood pressure was well-controlled and reported no chest pain or shortness of breath.  During his follow-up on 11/11/2022 it was discovered that patient was noncompliant with his medicines due to lapse in memory.  He was referred to neurology and social work for additional resources.  Neurology consult was completed on 12/09/2022 by [name], PA.  He was started on memantine and is scheduled to undergo MRI of the brain without contrast with further neurocognitive testing to  be completed.    Since last being seen in the office patient reports***.  Patient denies chest pain, palpitations, dyspnea, PND, orthopnea, nausea, vomiting, dizziness, syncope, edema, weight gain, or early satiety.     ***Notes:  Home Medications    Current Outpatient Medications  Medication Sig Dispense Refill   Apoaequorin (PREVAGEN) 10 MG CAPS Take 1 tablet by mouth daily.     aspirin EC 81 MG tablet Take 1 tablet (81 mg total) by mouth daily. Swallow whole. 30 tablet 12   atorvastatin (LIPITOR) 40 MG tablet Take 1 tablet (40 mg total) by  mouth daily. 90 tablet 3   Cholecalciferol (THERA-D 2000) 50 MCG (2000 UT) TABS 1 tab by mouth once daily 90 tablet 99   losartan (COZAAR) 100 MG tablet Take 1 tablet (100 mg total) by mouth daily. 90 tablet 3   memantine (NAMENDA) 5 MG tablet Take 1 tablet (5 mg at night) for 2 weeks, then increase to 1 tablet (5 mg) twice a day 60 tablet 11   metoprolol succinate (TOPROL XL) 25 MG 24 hr tablet Take 0.5 tablets (12.5 mg total) by mouth at bedtime. 30 tablet 1   Multiple Vitamins-Minerals (VITA-MIN PO) Take 1 tablet by mouth daily at 6 (six) AM. Beef Root Supplement,Pt takes 1000 mg 1 tablet daily.     Multiple Vitamins-Minerals (VITA-MIN PO) Take 1 tablet by mouth daily at 6 (six) AM. Garlic Supplement ,Pt takes 0454 mg 1 tablet daily.     sertraline (ZOLOFT) 25 MG tablet Take 1 tablet (25 mg total) by mouth daily. 90 tablet 3   tamsulosin (FLOMAX) 0.4 MG CAPS capsule Take 0.4 mg by mouth at bedtime.     triamcinolone cream (KENALOG) 0.1 % Apply 1 Application topically 2 (two) times daily as needed (dry skin).     vitamin B-12 (CYANOCOBALAMIN) 100 MCG tablet Take 1 tablet (100 mcg total) by mouth daily. 90 tablet 3   No current facility-administered medications for this visit.     Review of Systems  Please see the history of present illness.    (+)*** (+)***  All other systems reviewed and are otherwise negative except as noted above.  Physical Exam    Wt Readings from Last 3 Encounters:  12/09/22 119 lb (54 kg)  05/09/22 117 lb 0.3 oz (53.1 kg)  05/07/22 115 lb (52.2 kg)   UJ:WJXBJ were no vitals filed for this visit.,There is no height or weight on file to calculate BMI.  Constitutional:      Appearance: Healthy appearance. Not in distress.  Neck:     Vascular: JVD normal.  Pulmonary:     Effort: Pulmonary effort is normal.     Breath sounds: No wheezing. No rales. Diminished in the bases Cardiovascular:     Normal rate. Regular rhythm. Normal S1. Normal S2.      Murmurs:  There is no murmur.  Edema:    Peripheral edema absent.  Abdominal:     Palpations: Abdomen is soft non tender. There is no hepatomegaly.  Skin:    General: Skin is warm and dry.  Neurological:     General: No focal deficit present.     Mental Status: Alert and oriented to person, place and time.     Cranial Nerves: Cranial nerves are intact.  EKG/LABS/ Recent Cardiac Studies    ECG personally reviewed by me today - ***  Risk Assessment/Calculations:   {Does this patient have ATRIAL FIBRILLATION?:[phone]}  Lab Results  Component Value Date   WBC 6.4 12/09/2022   HGB 13.9 12/09/2022   HCT 42.5 12/09/2022   MCV 87.8 12/09/2022   PLT 222.0 12/09/2022   Lab Results  Component Value Date   CREATININE 0.89 11/25/2022   BUN 10 11/25/2022   NA 143 11/25/2022   K 4.3 11/25/2022   CL 102 11/25/2022   CO2 24 11/25/2022   Lab Results  Component Value Date   ALT 17 07/09/2021   AST 18 07/09/2021   ALKPHOS 69 07/09/2021   BILITOT 1.0 07/09/2021   Lab Results  Component Value Date   CHOL 184 12/12/2020   HDL 75.10 12/12/2020   LDLCALC 97 12/12/2020   TRIG 58.0 12/12/2020   CHOLHDL 2 12/12/2020    Lab Results  Component Value Date   HGBA1C 5.6 06/03/2021    Cardiac Studies & Procedures       ECHOCARDIOGRAM  ECHOCARDIOGRAM COMPLETE 07/24/2021  Narrative ECHOCARDIOGRAM REPORT    Patient Name:   BILLIE INTRIAGO [name] Date of Exam: 07/24/2021 Medical Rec #:  [id number]             Height:       70.0 in Accession #:    [id number]            Weight:       119.0 lb Date of Birth:  1940/12/24            BSA:          1.674 m Patient Age:    79 years              BP:           112/60 mmHg Patient Gender: M                     HR:           78 bpm. Exam Location:  [address]  Procedure: 2D Echo  Indications:    Ventricular Tachycardia I47.2  History:        Patient has prior history of Echocardiogram examinations, most recent  10/17/2017.  Sonographer:    Thurman Coyer RDCS Referring Phys: [id number] Charlies Constable [facility]  IMPRESSIONS   1. Left ventricular ejection fraction, by estimation, is 45 to 50%. The left ventricle has mildly decreased function. The left ventricle demonstrates regional wall motion abnormalities. Septal hypokinesis. Left ventricular diastolic parameters were normal. 2. Right ventricular systolic function is normal. The right ventricular size is mildly enlarged. There is normal pulmonary artery systolic pressure. The estimated right ventricular systolic pressure is 27.8 mmHg. 3. The mitral valve is normal in structure. No evidence of mitral valve regurgitation. No evidence of mitral stenosis. 4. The aortic valve is tricuspid. Appears partial fusion of left and noncoronary cusps. Aortic valve regurgitation is trivial. No aortic stenosis is present. 5. The inferior vena cava is normal in size with greater than 50% respiratory variability, suggesting right atrial pressure of 3 mmHg. 6. Evidence of atrial level shunting detected by color flow Doppler. Appears to be left to right flow from atria on subcostal images suggesting ASD, and RV is dilated, recommend further evaluation with either cardiac MRI, gated CTA, or TEE  FINDINGS Left Ventricle: Left ventricular ejection fraction, by estimation, is 45 to 50%. The left ventricle has mildly decreased function. The left ventricle demonstrates regional wall motion abnormalities. The left ventricular internal cavity size was normal in size. There is no left ventricular hypertrophy. Left  ventricular diastolic parameters were normal.  Right Ventricle: The right ventricular size is mildly enlarged. Right vetricular wall thickness was not well visualized. Right ventricular systolic function is normal. There is normal pulmonary artery systolic pressure. The tricuspid regurgitant velocity is 2.49 m/s, and with an assumed right atrial pressure of 3 mmHg, the estimated  right ventricular systolic pressure is 27.8 mmHg.  Left Atrium: Left atrial size was normal in size.  Right Atrium: Right atrial size was normal in size.  Pericardium: There is no evidence of pericardial effusion.  Mitral Valve: The mitral valve is normal in structure. No evidence of mitral valve regurgitation. No evidence of mitral valve stenosis.  Tricuspid Valve: The tricuspid valve is normal in structure. Tricuspid valve regurgitation is mild.  Aortic Valve: The aortic valve is tricuspid. Aortic valve regurgitation is trivial. No aortic stenosis is present.  Pulmonic Valve: The pulmonic valve was not well visualized. Pulmonic valve regurgitation is not visualized.  Aorta: The aortic root is normal in size and structure.  Venous: The inferior vena cava is normal in size with greater than 50% respiratory variability, suggesting right atrial pressure of 3 mmHg.  IAS/Shunts: Evidence of atrial level shunting detected by color flow Doppler.   LEFT VENTRICLE PLAX 2D LVIDd:         3.70 cm     Diastology LVIDs:         2.80 cm     LV e' medial:    7.18 cm/s LV PW:         0.90 cm     LV E/e' medial:  8.8 LV IVS:        1.00 cm     LV e' lateral:   15.00 cm/s LVOT diam:     2.20 cm     LV E/e' lateral: 4.2 LV SV:         47 LV SV Index:   28 LVOT Area:     3.80 cm  LV Volumes (MOD) LV vol d, MOD A2C: 57.1 ml LV vol d, MOD A4C: 47.5 ml LV vol s, MOD A2C: 28.9 ml LV vol s, MOD A4C: 26.1 ml LV SV MOD A2C:     28.2 ml LV SV MOD A4C:     47.5 ml LV SV MOD BP:      23.7 ml  RIGHT VENTRICLE RV S prime:     12.20 cm/s TAPSE (M-mode): 1.5 cm  LEFT ATRIUM           Index        RIGHT ATRIUM           Index LA diam:      1.80 cm 1.08 cm/m   RA Area:     14.40 cm LA Vol (A2C): 25.1 ml 14.99 ml/m  RA Volume:   34.30 ml  20.49 ml/m LA Vol (A4C): 10.4 ml 6.21 ml/m AORTIC VALVE LVOT Vmax:   69.70 cm/s LVOT Vmean:  41.900 cm/s LVOT VTI:    0.123 m  AORTA Ao Root diam: 3.70  cm  MITRAL VALVE               TRICUSPID VALVE MV Area (PHT): 2.50 cm    TR Peak grad:   24.8 mmHg MV Decel Time: 303 msec    TR Vmax:        249.00 cm/s MV E velocity: 63.40 cm/s MV A velocity: 60.30 cm/s  SHUNTS MV E/A ratio:  1.05  Systemic VTI:  0.12 m Systemic Diam: 2.20 cm  Epifanio Lesches MD Electronically signed by Epifanio Lesches MD Signature Date/Time: 07/24/2021/10:30:46 AM    Final   TEE  ECHO TEE 08/15/2021  Narrative TRANSESOPHOGEAL ECHO REPORT    Patient Name:   ALEXIS MIZUNO [name] Date of Exam: 08/15/2021 Medical Rec #:  [id number]             Height:       70.0 in Accession #:    [id number]            Weight:       124.0 lb Date of Birth:  01-03-1941            BSA:          1.704 m Patient Age:    36 years              BP:           136/97 mmHg Patient Gender: M                     HR:           79 bpm. Exam Location:  Inpatient  Procedure: Transesophageal Echo, Color Doppler, Cardiac Doppler, 3D Echo and Saline Contrast Bubble Study  Indications:     Q21.1 ASD  History:         Patient has prior history of Echocardiogram examinations. Risk Factors:Hypertension and Dyslipidemia.  Sonographer:     Irving Burton Senior RDCS Referring Phys:  [id number] Jake Bathe Diagnosing Phys: Donato Schultz MD  PROCEDURE: After discussion of the risks and benefits of a TEE, an informed consent was obtained from the patient. The transesophogeal probe was passed without difficulty through the esophogus of the patient. Sedation performed by different physician. The patient was monitored while under deep sedation. Anesthestetic sedation was provided intravenously by Anesthesiology: 225mg  of Propofol, 40mg  of Lidocaine. The patient's vital signs; including heart rate, blood pressure, and oxygen saturation; remained stable throughout the procedure. The patient developed no complications during the procedure.  IMPRESSIONS   1. Large Secundum atrial septal defect  (ASD) with bidirectional flow, 1.7 x 1.8 cm. 2. Left ventricular ejection fraction, by estimation, is 60 to 65%. The left ventricle has normal function. The left ventricle has no regional wall motion abnormalities. 3. Right ventricular systolic function is normal. The right ventricular size is mildly enlarged. There is normal pulmonary artery systolic pressure. 4. No left atrial/left atrial appendage thrombus was detected. 5. The mitral valve is normal in structure. Trivial mitral valve regurgitation. No evidence of mitral stenosis. 6. The aortic valve is tricuspid. Aortic valve regurgitation is trivial. No aortic stenosis is present. 7. The inferior vena cava is normal in size with greater than 50% respiratory variability, suggesting right atrial pressure of 3 mmHg. 8. Evidence of atrial level shunting detected by color flow Doppler. Agitated saline contrast bubble study was positive with shunting observed within 3-6 cardiac cycles suggestive of interatrial shunt. There is a large secundum atrial septal defect with bidirectional shunting across the atrial septum.  FINDINGS Left Ventricle: Left ventricular ejection fraction, by estimation, is 60 to 65%. The left ventricle has normal function. The left ventricle has no regional wall motion abnormalities. The left ventricular internal cavity size was normal in size. There is no left ventricular hypertrophy. The ratio of pulmonic flow to systemic flow (Qp/Qs ratio) is 1.90.  Right Ventricle: The right ventricular size is mildly enlarged. No increase in  right ventricular wall thickness. Right ventricular systolic function is normal. There is normal pulmonary artery systolic pressure. The tricuspid regurgitant velocity is 2.30 m/s, and with an assumed right atrial pressure of 3 mmHg, the estimated right ventricular systolic pressure is 24.2 mmHg.  Left Atrium: Left atrial size was normal in size. No left atrial/left atrial appendage thrombus was  detected.  Right Atrium: Right atrial size was normal in size.  Pericardium: There is no evidence of pericardial effusion.  Mitral Valve: The mitral valve is normal in structure. Trivial mitral valve regurgitation. No evidence of mitral valve stenosis.  Tricuspid Valve: The tricuspid valve is normal in structure. Tricuspid valve regurgitation is mild . No evidence of tricuspid stenosis.  Aortic Valve: The aortic valve is tricuspid. Aortic valve regurgitation is trivial. No aortic stenosis is present.  Pulmonic Valve: The pulmonic valve was normal in structure. Pulmonic valve regurgitation is not visualized. No evidence of pulmonic stenosis.  Aorta: The aortic root is normal in size and structure.  Venous: The inferior vena cava is normal in size with greater than 50% respiratory variability, suggesting right atrial pressure of 3 mmHg.  IAS/Shunts: Evidence of atrial level shunting detected by color flow Doppler. Agitated saline contrast was given intravenously to evaluate for intracardiac shunting. Agitated saline contrast bubble study was positive with shunting observed within 3-6 cardiac cycles suggestive of interatrial shunt. There is a large secundum atrial septal defect with bidirectional shunting across the atrial septum. The ratio of pulmonic flow to systemic flow (Qp/Qs ratio) is 1.90.  Additional Comments: Large Secundum atrial septal defect (ASD) with bidirectional flow, 1.7 x 1.8 cm.   LEFT VENTRICLE PLAX 2D LVOT diam:     2.20 cm LV SV:         57 LV SV Index:   33 LVOT Area:     3.80 cm   RIGHT VENTRICLE RVOT diam:      2.90 cm  AORTIC VALVE             PULMONIC VALVE LVOT Vmax:   88.30 cm/s  RVOT Peak grad: 2 mmHg LVOT Vmean:  52.700 cm/s LVOT VTI:    0.149 m  TRICUSPID VALVE TR Peak grad:   21.2 mmHg TR Vmax:        230.00 cm/s  SHUNTS Systemic VTI:  0.15 m Systemic Diam: 2.20 cm Pulmonic VTI:  0.162 m Pulmonic Diam: 2.90 cm Qp/Qs:         1.89  Donato Schultz MD Electronically signed by Donato Schultz MD Signature Date/Time: 08/15/2021/3:06:55 PM    Final   MONITORS  CARDIAC EVENT MONITOR 07/05/2021  Narrative The patient's monitoring period was 06/12/2021 - 07/02/2021. Baseline sample showed Sinus Rhythm with a heart rate of 87.7 bpm. There were 2 critical, 0 serious, and 10 stable events that occurred. The report analysis of the critical, serious, stable and manually triggered events are listed below. Manually Detected Events: 1 Stable: Sinus Rhythm w/PVCs (3 in 1 min)  None or Accidental Push 1 Stable: Sinus Rhythm  Flutter or Skipped Beats 4 Stable: Sinus Rhythm  None or Accidental Push 1 Stable: Sinus Rhythm  None or Accidental Push; Tired or Fatigued 1 Stable: Sinus Rhythm w/Artifact  None or Accidental Push  The patient has been referred and is undergoing Cardiology evaluation.   CT SCANS  CT CORONARY MORPH W/CTA COR W/SCORE 10/22/2021  Addendum 10/22/2021  8:35 PM ADDENDUM REPORT: 10/22/2021 20:32  CLINICAL DATA:  This is a 29 old year old  male with chest pain and history of Secundum ASD.  EXAM: Cardiac/Coronary  CTA  TECHNIQUE: The patient was scanned on a [employer].  FINDINGS: A 100 kV prospective scan was triggered in the descending thoracic aorta at 111 HU's. Axial non-contrast 3 mm slices were carried out through the heart. The data set was analyzed on a dedicated work station and scored using the Agatson method. Gantry rotation speed was 250 msecs and collimation was .6 mm. No beta blockade and 0.8 mg of sl NTG was given. The 3D data set was reconstructed in 5% intervals of the 67-82 % of the R-R cycle. Diastolic phases were analyzed on a dedicated work station using MPR, MIP and VRT modes. The patient received 80 cc of contrast.  Aorta: Normal size. Mild aortic root calcifications. No dissection.  Aortic Valve:  Trileaflet.  No calcifications.  Coronary Arteries:  Normal coronary  origin.  Right dominance.  RCA is a large dominant artery that gives rise to PDA and PLA. There is no plaque.  Left main is a large artery that gives rise to LAD and LCX arteries.  LAD is a large torturous vessel. The proximal LAD with mild (25-49%). The mid and distal LAD with no plaques. D1 with minimal calcification in the mid portion of the vessel.  LCX is a non-dominant artery that gives rise to one large OM1 branch. There is no plaque  Coronary Calcium Score:  Left main: 0  Left anterior descending artery: 127  Left circumflex artery: 0  Right coronary artery: 0  Total: 127  Percentile: 52  Other findings:  There is a evidence of a large secundum ASD (19.4 mm) with left to right flow.  The right atrium and right ventricle are dilated.  Normal pulmonary vein drainage into the left atrium.  Normal left atrial appendage without a thrombus.  Normal size of the pulmonary artery.  IMPRESSION: 1. Coronary calcium score of 127. This was 52 percentile for age and sex matched control.  2. Normal coronary origin with right dominance.  3. CAD-RADS 2. Mild non-obstructive CAD (25-49%). Consider non-atherosclerotic causes of chest pain. Consider preventive therapy and risk factor modification.  4. Large Secundum ASD noted with dilatation of the right atrium and the right ventricle.   Electronically Signed By: Thomasene Ripple D.O. On: 10/22/2021 20:32  Narrative EXAM: OVER-READ INTERPRETATION  CT CHEST  The following report is an over-read performed by radiologist Dr. Richarda Overlie of [facility] on 10/22/2021. This over-read does not include interpretation of cardiac or coronary anatomy or pathology. The cardiac CTA interpretation by the cardiologist is attached.  COMPARISON:  12/27/2015  FINDINGS: Vascular: Mild atherosclerotic disease in the descending thoracic aorta.  Mediastinum/Nodes: Visualized mediastinal structures are  normal.  Lungs/Pleura: Evidence for chronic changes particularly in the anterior left upper lung. Findings could represent paraseptal emphysema. Patchy peripheral densities in lower lobes are nonspecific and could represent atelectasis versus mild fibrotic changes.  Upper Abdomen: Limited evaluation of the upper abdomen.  Musculoskeletal: Again noted are deformity of left lateral ribs.  IMPRESSION: 1. No acute extracardiac findings. 2. Chronic lung changes and suspect areas of paraseptal emphysema and difficult to exclude mild fibrotic changes.  Electronically Signed: By: Richarda Overlie M.D. On: 10/22/2021 11:46          Assessment & Plan   1.  Coronary artery disease: -Patient had coronary calcifications noted on recent CT scan -Patient has history of ASD that was evaluated by structural heart team  and is currently being treated medically. -Today patient reports no chest pain or shortness of breath since previous visit. -He has been noncompliant with his medications be restarted on Toprol-XL 12.5 mg daily -Continue current GDMT with ASA 81 mg and atorvastatin 40 mg daily   2.  PAD: -Patient evaluated by Dr. Kirke Corin and found to have occluded right popliteal artery and occluded distal SFA.  He is currently being treated medically at this time. -Today patient denies any claudication and reports that he is in the process of quitting smoking and is down to 2 to 3 cigarettes/day.     3.  History of CVA: -Continue ASA 81 mg and Lipitor 40 mg as noted above -Patient has ASD and elected to treat medically due to comorbidities and age   21.  Hypertension: HYPERTENSION CONTROL     Vitals:    11/11/22 0949 11/11/22 1009  BP: (!) 148/98 (!) 152/96    The patient's blood pressure is elevated above target today.   In order to address the patient's elevated BP: Blood pressure will be monitored at home to determine if medication changes need to be made.; A current anti-hypertensive  medication was adjusted today.     -We will restart losartan 100 mg daily and Toprol-XL 12.5 mg daily -We will check BMET today and BMET in 2 weeks   5.  History of NSVT:  The patient reports no recurrence of palpitations -We will restart on Toprol 12.5 mg daily today   6.  Poor medication compliance: -Patient recently discontinued his HCTZ, Toprol-XL, and losartan due to memory deficit and polypharmacy. -We will refer to social work to see if patient is eligible for home medication management assistance.   7.  Intermittent memory loss: -Patient has history of multiple past strokes and has intermittent memory loss.  He recently self discontinued his Aricept 10 mg along with several other medications. He is unaware of current dosages or regimens related to his medications. -We will refer him to neurology for assistance in management of his memory deficit.     Disposition: Follow-up with [name], MD or APP in *** months {Are you ordering a CV Procedure (e.g. stress test, cath, DCCV, TEE, etc)?   Press F2        :[id number]}   Medication Adjustments/Labs and Tests Ordered: Current medicines are reviewed at length with the patient today.  Concerns regarding medicines are outlined above.   Signed, Napoleon Form, Leodis Rains, NP 12/10/2022, 8:40 AM [facility]  Note:  This document was prepared using Dragon voice recognition software and may include unintentional dictation errors.

## 2022-12-11 ENCOUNTER — Ambulatory Visit: Payer: 59 | Attending: Nurse Practitioner | Admitting: Nurse Practitioner

## 2022-12-11 ENCOUNTER — Encounter: Payer: Self-pay | Admitting: Nurse Practitioner

## 2022-12-11 VITALS — BP 120/70 | HR 76 | Ht 68.5 in | Wt 119.0 lb

## 2022-12-11 DIAGNOSIS — I10 Essential (primary) hypertension: Secondary | ICD-10-CM | POA: Diagnosis not present

## 2022-12-11 DIAGNOSIS — Z8673 Personal history of transient ischemic attack (TIA), and cerebral infarction without residual deficits: Secondary | ICD-10-CM

## 2022-12-11 DIAGNOSIS — I4729 Other ventricular tachycardia: Secondary | ICD-10-CM

## 2022-12-11 DIAGNOSIS — Z91148 Patient's other noncompliance with medication regimen for other reason: Secondary | ICD-10-CM | POA: Diagnosis not present

## 2022-12-11 DIAGNOSIS — I2510 Atherosclerotic heart disease of native coronary artery without angina pectoris: Secondary | ICD-10-CM

## 2022-12-11 DIAGNOSIS — I739 Peripheral vascular disease, unspecified: Secondary | ICD-10-CM

## 2022-12-11 NOTE — Patient Instructions (Signed)
Medication Instructions:  YOU CAN TAKE TYLENOL ARTHRITIS *If you need a refill on your cardiac medications before your next appointment, please call your pharmacy*   Lab Work: NONE ORDERED If you have labs (blood work) drawn today and your tests are completely normal, you will receive your results only by: MyChart Message (if you have MyChart) OR A paper copy in the mail If you have any lab test that is abnormal or we need to change your treatment, we will call you to review the results.   Testing/Procedures: NONE ORDERED   Follow-Up: At [facility], you and your health needs are our priority.  As part of our continuing mission to provide you with exceptional heart care, we have created designated Provider Care Teams.  These Care Teams include your primary Cardiologist (physician) and Advanced Practice Providers (APPs -  Physician Assistants and Nurse Practitioners) who all work together to provide you with the care you need, when you need it.  We recommend signing up for the patient portal called "MyChart".  Sign up information is provided on this After Visit Summary.  MyChart is used to connect with patients for Virtual Visits (Telemedicine).  Patients are able to view lab/test results, encounter notes, upcoming appointments, etc.  Non-urgent messages can be sent to your provider as well.   To learn more about what you can do with MyChart, go to [email or web address].    Your next appointment:   6 month(s)  Provider:   [name], MD     Other Instructions Please monitor blood pressures and keep a log of your readings.   Make sure to check 2 hours after your medications.   AVOID these things for 30 minutes before checking your blood pressure: No Drinking caffeine. No Drinking alcohol. No Eating. No Smoking. No Exercising.  Five minutes before checking your blood pressure: Pee. Sit in a dining chair. Avoid sitting in a soft couch or armchair. Be quiet. Do  not talk.

## 2022-12-11 NOTE — Progress Notes (Signed)
Labs are normal, thanks

## 2023-01-03 ENCOUNTER — Ambulatory Visit
Admission: RE | Admit: 2023-01-03 | Discharge: 2023-01-03 | Disposition: A | Discharge status: Home / Self Care | Payer: 59 | Source: Ambulatory Visit | Attending: Physician Assistant | Admitting: Physician Assistant

## 2023-01-03 DIAGNOSIS — R413 Other amnesia: Secondary | ICD-10-CM | POA: Diagnosis not present

## 2023-01-09 NOTE — Progress Notes (Signed)
The MRI brain shows age related changes, especially on the circulation, and mild age related atrophy. There is a remote, tiny old stroke but there are no new acute strokes, or masses, or any other abnormal findings. Thanks

## 2023-03-10 ENCOUNTER — Ambulatory Visit: Payer: 59 | Admitting: Physician Assistant

## 2023-03-16 ENCOUNTER — Ambulatory Visit: Payer: 59 | Admitting: Internal Medicine

## 2023-03-24 ENCOUNTER — Ambulatory Visit: Payer: 59 | Admitting: Physician Assistant

## 2023-06-15 NOTE — Progress Notes (Unsigned)
Cardiology Office Note:   Date:  06/16/2023  ID:  Roberto Hatchet [name], DOB 1941-05-13, MRN [id number] PCP:  Corwin Levins, MD  [facility] Providers Cardiologist:  Alverda Skeans, MD Referring MD: Corwin Levins, MD  Chief Complaint/Reason for Referral:  Cardiology follow up ASSESSMENT:    1. ASD (atrial septal defect)   2. Coronary artery disease involving native coronary artery of native heart without angina pectoris   3. PAD (peripheral artery disease) (HCC)   4. History of CVA (cerebrovascular accident)   5. Essential hypertension   6. Hyperlipidemia LDL goal <70   7. Tobacco abuse   8. 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  Medical management. Coronary artery calcification: Continue aspirin and statin. Peripheral arterial disease: Continue aspirin and statin; follows with Dr. Kirke Corin.  Has abnormal ABIs but is asymptomatic.  His lower extremities are not in jeopardy. History of stroke: Continue medical therapy.  Deferring transcatheter closure given ostium premium defect. Hypertension:  BP is well controlled today. Hyperlipidemia: Check lipid panel and LFTs today. Tobacco abuse:  Still smoking, stressed need to abstinence. Aortic atherosclerosis: Continue aspirin and statin.            Dispo:  Return in about 6 months (around 12/15/2023).      Medication Adjustments/Labs and Tests Ordered: Current medicines are reviewed at length with the patient today.  Concerns regarding medicines are outlined above.  The following changes have been made:  no change   Labs/tests ordered: Orders Placed This Encounter  Procedures   Hepatic function panel   Lipid panel    Medication Changes: No orders of the defined types were placed in this encounter.   Current medicines are reviewed at length with the patient today.  The patient does not have concerns regarding  medicines.  I spent 35 minutes reviewing all clinical data during and prior to this visit including all relevant imaging studies, laboratories, clinical information from other health systems, and prior notes from both Cardiology and other specialties, interviewing the patient, and conducting a complete physical examination in order to formulate a comprehensive and personalized evaluation and treatment plan.  History of Present Illness:      FOCUSED PROBLEM LIST:   Ostium primum ASD Reviewed with imagers  Conservative management given advanced age Tobacco abuse Hypertension NSVT Monitor October 2022 Peripheral vascular disease Followed by Dr. Kirke Corin Abnormal ABIs 0.72 right 0.7 left Abdominal ultrasound 2023 negative for AAA Coronary artery calcification Aortic atherosclerosis CTA 2023 Mild coronary artery disease  November 2022:  Roberto Hatchet [name] is a 82 y.o. male with the indicated history here for recommendations regarding NSVT seen on a monitor.  The patient had been seen in the emergency department last month due to presyncope after using the restroom.  The patient received IV fluids and felt much better.  EKG demonstrates sinus rhythm with anterior infarct pattern.  His labs were relatively unremarkable.  An echocardiogram was ordered but not done.  He had carotid Dopplers that were relatively unremarkable.  He did have a monitor placed which demonstrated 11 beat run of nonsustained ventricular tachycardia.  He has not had any recurrent presyncope or syncope.  He is really stressed because he lives right now in a place that he does not feel is safe.  He denies any chest pain, palpitations, paroxysmal nocturnal dyspnea, orthopnea, signs or symptoms stroke, or bad  bleeding episodes.  He is required no emergency room visits or hospitalizations.  He was seen by his primary care provider yesterday and his aspirin was reduced to 81 mg.  Plan: Start Toprol-XL 12.5 mg due to NSVT and consider  coronary CTA.   February 2023:  The patient is a 82 y.o. male with the indicated medical history here for follow-up of TEE findings.  He had an echocardiogram when I saw him last time that suggested an atrial level septal defect.  I referred him for a TEE which demonstrated an atrial secundum defect with bidirectional flow.  The rims seem to be reasonable for transcatheter closure.  Additionally he had complained of leg pain and had ABIs which are consistent with obstructive peripheral vascular disease.  He is to see Dr. Kirke Corin later this month.  In terms of symptoms the patient denies chest pain, shortness of breath, peripheral edema, paroxysmal nocturnal dyspnea, orthopnea.  Plan, reviewed TEE with structural heart imagers and consider transcatheter closure.  April 2023:  The patient is a 82 y.o. male with the indicated medical history here for routine follow-up.  He underwent a TEE which demonstrated ostium premium defect that is not closable by transcatheter techniques.  This was reviewed with the imagers in our structural heart meeting.  He is otherwise doing well.  He denies any shortness of breath, chest pain, peripheral edema, paroxysmal nocturnal dyspnea.  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  He is required no emergency room visits or hospitalizations.  He has had no bad bleeding or bruising episodes.  Plan: Continue medical therapy.  October 2024: In the interim since I saw him last the patient has been seen multiple times in the cardiology clinic.  He has been doing well and continued on medical therapy.  He has also been seen for his peripheral vascular disease and given his wheelchair-bound status with no evidence of critical limb ischemia further interventions were deferred.  The patient continues to do well.  He denies any chest pain, shortness of breath, or signs or symptoms of stroke.  He is compliant with his medications.  He is not checking his  blood pressures at home.  He does not do much in terms of walking and is mostly in his wheelchair.  He is pretty happy with this level of functional capacity.  He unfortunately continues to smoke but he tells me he only uses it as a pacifier.          Current Medications: Current Meds  Medication Sig   Apoaequorin (PREVAGEN) 10 MG CAPS Take 1 tablet by mouth daily.   aspirin EC 81 MG tablet Take 1 tablet (81 mg total) by mouth daily. Swallow whole.   atorvastatin (LIPITOR) 40 MG tablet Take 1 tablet (40 mg total) by mouth daily.   Cholecalciferol (THERA-D 2000) 50 MCG (2000 UT) TABS 1 tab by mouth once daily   losartan (COZAAR) 100 MG tablet Take 1 tablet (100 mg total) by mouth daily.   metoprolol succinate (TOPROL XL) 25 MG 24 hr tablet Take 0.5 tablets (12.5 mg total) by mouth at bedtime.   Multiple Vitamins-Minerals (VITA-MIN PO) Take 1 tablet by mouth daily at 6 (six) AM. Beef Root Supplement,Pt takes 1000 mg 1 tablet daily.   Multiple Vitamins-Minerals (VITA-MIN PO) Take 1 tablet by mouth daily at 6 (six) AM. Garlic Supplement ,Pt takes 4098 mg 1 tablet daily.   sertraline (ZOLOFT) 25 MG tablet Take 1 tablet (25 mg  total) by mouth daily.   tamsulosin (FLOMAX) 0.4 MG CAPS capsule Take 0.4 mg by mouth at bedtime.   triamcinolone cream (KENALOG) 0.1 % Apply 1 Application topically 2 (two) times daily as needed (dry skin).   vitamin B-12 (CYANOCOBALAMIN) 100 MCG tablet Take 1 tablet (100 mcg total) by mouth daily.     Review of Systems:   Please see the history of present illness.    All other systems reviewed and are negative.     EKGs/Labs/Other Test Reviewed:   EKG: EKG performed March 2024 demonstrates sinus rhythm with left ventricular hypertrophy.  EKG Interpretation Date/Time:    Ventricular Rate:    PR Interval:    QRS Duration:    QT Interval:    QTC Calculation:   R Axis:      Text Interpretation:           Risk Assessment/Calculations:          Physical  Exam:   VS:  BP 120/80   Pulse 100   Ht 5' 5.5" (1.664 m)   Wt 115 lb 6.4 oz (52.3 kg)   SpO2 99%   BMI 18.91 kg/m        Wt Readings from Last 3 Encounters:  06/16/23 115 lb 6.4 oz (52.3 kg)  12/11/22 119 lb (54 kg)  12/09/22 119 lb (54 kg)      GENERAL:  No apparent distress, AOx3 HEENT:  No carotid bruits, +2 carotid impulses, no scleral icterus CAR: RRR no murmurs, gallops, rubs, or thrills RES:  Clear to auscultation bilaterally ABD:  Soft, nontender, nondistended, positive bowel sounds x 4 VASC:  +2 radial pulses, +2 carotid pulses NEURO:  CN 2-12 grossly intact; motor and sensory grossly intact PSYCH:  No active depression or anxiety EXT:  No edema, ecchymosis, or cyanosis; legs look to be safe without nonhealing ulceration.  Signed, [name], MD  06/16/2023 1:35 PM    [facility] [address] [place], [place], [place]  [address] Phone: [phone]; Fax: [phone]   Note:  This document was prepared using Dragon voice recognition software and may include unintentional dictation errors.

## 2023-06-16 ENCOUNTER — Encounter: Payer: Self-pay | Admitting: Internal Medicine

## 2023-06-16 ENCOUNTER — Ambulatory Visit: Payer: 59 | Attending: Internal Medicine | Admitting: Internal Medicine

## 2023-06-16 VITALS — BP 120/80 | HR 100 | Ht 65.5 in | Wt 115.4 lb

## 2023-06-16 DIAGNOSIS — I739 Peripheral vascular disease, unspecified: Secondary | ICD-10-CM | POA: Diagnosis not present

## 2023-06-16 DIAGNOSIS — Z720 Tobacco use: Secondary | ICD-10-CM | POA: Diagnosis not present

## 2023-06-16 DIAGNOSIS — I2510 Atherosclerotic heart disease of native coronary artery without angina pectoris: Secondary | ICD-10-CM | POA: Diagnosis not present

## 2023-06-16 DIAGNOSIS — I700 Atherosclerosis of aorta: Secondary | ICD-10-CM

## 2023-06-16 DIAGNOSIS — Z8673 Personal history of transient ischemic attack (TIA), and cerebral infarction without residual deficits: Secondary | ICD-10-CM

## 2023-06-16 DIAGNOSIS — I10 Essential (primary) hypertension: Secondary | ICD-10-CM | POA: Diagnosis not present

## 2023-06-16 DIAGNOSIS — Q211 Atrial septal defect, unspecified: Secondary | ICD-10-CM

## 2023-06-16 DIAGNOSIS — E785 Hyperlipidemia, unspecified: Secondary | ICD-10-CM | POA: Diagnosis not present

## 2023-06-16 NOTE — Patient Instructions (Signed)
Medication Instructions:  Your physician recommends that you continue on your current medications as directed. Please refer to the Current Medication list given to you today.  *If you need a refill on your cardiac medications before your next appointment, please call your pharmacy*  Lab Work: TODAY: Lipid panel, LFTs If you have labs (blood work) drawn today and your tests are completely normal, you will receive your results only by: MyChart Message (if you have MyChart) OR A paper copy in the mail If you have any lab test that is abnormal or we need to change your treatment, we will call you to review the results.  Testing/Procedures: None ordered today.  Follow-Up: At [facility], you and your health needs are our priority.  As part of our continuing mission to provide you with exceptional heart care, we have created designated Provider Care Teams.  These Care Teams include your primary Cardiologist (physician) and Advanced Practice Providers (APPs -  Physician Assistants and Nurse Practitioners) who all work together to provide you with the care you need, when you need it.  Your next appointment:   6 month(s)  The format for your next appointment:   In Person  Provider:   Jari Favre, PA-C, Ronie Spies, PA-C, Robin Searing, NP, Jacolyn Reedy, PA-C, Eligha Bridegroom, NP, Tereso Newcomer, PA-C, or Perlie Gold, PA-C

## 2023-06-17 LAB — LIPID PANEL
Chol/HDL Ratio: 4.7 ratio (ref 0.0–5.0)
Cholesterol, Total: 202 mg/dL — ABNORMAL HIGH (ref 100–199)
HDL: 43 mg/dL (ref >39)
LDL Chol Calc (NIH): 132 mg/dL — ABNORMAL HIGH (ref 0–99)
Triglycerides: 152 mg/dL — ABNORMAL HIGH (ref 0–149)
VLDL Cholesterol Cal: 27 mg/dL (ref 5–40)

## 2023-06-17 LAB — HEPATIC FUNCTION PANEL
ALT: 9 [IU]/L (ref 0–44)
AST: 12 [IU]/L (ref 0–40)
Albumin: 4.5 g/dL (ref 3.8–4.8)
Alkaline Phosphatase: 94 [IU]/L (ref 44–121)
Bilirubin Total: <0.2 mg/dL (ref 0.0–1.2)
Bilirubin, Direct: <0.1 mg/dL (ref 0.00–0.40)
Total Protein: 7.5 g/dL (ref 6.0–8.5)

## 2023-06-26 ENCOUNTER — Telehealth: Payer: Self-pay | Admitting: *Deleted

## 2023-06-26 DIAGNOSIS — E785 Hyperlipidemia, unspecified: Secondary | ICD-10-CM

## 2023-06-26 MED ORDER — ATORVASTATIN CALCIUM 80 MG PO TABS: 80.0000 mg | ORAL_TABLET | Freq: Every day | ORAL | 3 refills | Status: DC

## 2023-06-26 NOTE — Telephone Encounter (Signed)
-----   Message from [name] sent at 06/17/2023  7:11 AM EDT ----- Have him increase his atorvastatin to 80 mg and check lipid panel and LFTs in 2 months.

## 2023-06-26 NOTE — Telephone Encounter (Signed)
Spoke w patient.  He voices understanding of results and recommendations.  Pt will take 2 of his 40 mg atorvastatin for now since he has a full bottle.  New prescription sent to [facility] per patient.  He is aware to come in for labs in 2 months.

## 2023-07-21 ENCOUNTER — Institutional Professional Consult (permissible substitution): Payer: 59 | Admitting: Psychology

## 2023-07-21 ENCOUNTER — Ambulatory Visit: Payer: Self-pay

## 2023-08-04 ENCOUNTER — Encounter: Payer: 59 | Admitting: Psychology

## 2023-08-17 ENCOUNTER — Ambulatory Visit (INDEPENDENT_AMBULATORY_CARE_PROVIDER_SITE_OTHER): Payer: 59

## 2023-08-17 VITALS — Ht 69.0 in | Wt 119.0 lb

## 2023-08-17 DIAGNOSIS — Z0000 Encounter for general adult medical examination without abnormal findings: Secondary | ICD-10-CM

## 2023-08-17 DIAGNOSIS — Z87891 Personal history of nicotine dependence: Secondary | ICD-10-CM

## 2023-08-17 NOTE — Progress Notes (Signed)
Subjective:   Roberto Fradette. is a 82 y.o. male who presents for Medicare Annual/Subsequent preventive examination.  Visit Complete: Virtual I connected with  Roberto Hatchet [name] on 08/17/23 by a audio enabled telemedicine application and verified that I am speaking with the correct person using two identifiers.  Patient Location: Home  Provider Location: Office/Clinic  I discussed the limitations of evaluation and management by telemedicine. The patient expressed understanding and agreed to proceed.  Vital Signs: Because this visit was a virtual/telehealth visit, some criteria may be missing or patient reported. Any vitals not documented were not able to be obtained and vitals that have been documented are patient reported.   Cardiac Risk Factors include: advanced age (>57men, >57 women);male gender     Objective:    Today's Vitals   08/17/23 1031  Weight: 119 lb (54 kg)  Height: 5\' 9"  (1.753 m)   Body mass index is 17.57 kg/m.     08/17/2023   10:37 AM 12/09/2022    7:46 AM 04/07/2022    1:56 PM 08/15/2021   10:01 AM 07/09/2021   11:59 AM 05/15/2021   12:18 PM 10/16/2017    7:00 PM  Advanced Directives  Does Patient Have a Medical Advance Directive? Yes Yes Yes No Unable to assess, patient is non-responsive or altered mental status No No  Type of Advance Directive Healthcare Power of [employer] Power of Attorney Living will      Does patient want to make changes to medical advance directive?  No - Patient declined No - Patient declined      Copy of Healthcare Power of Attorney in Chart?  No - copy requested       Would patient like information on creating a medical advance directive?    Yes (MAU/Ambulatory/Procedural Areas - Information given)   No - Patient declined    Current Medications (verified) Outpatient Encounter Medications as of 08/17/2023  Medication Sig   Apoaequorin (PREVAGEN) 10 MG CAPS Take 1 tablet by mouth daily.   aspirin EC 81 MG  tablet Take 1 tablet (81 mg total) by mouth daily. Swallow whole.   atorvastatin (LIPITOR) 80 MG tablet Take 1 tablet (80 mg total) by mouth daily.   Cholecalciferol (THERA-D 2000) 50 MCG (2000 UT) TABS 1 tab by mouth once daily   losartan (COZAAR) 100 MG tablet Take 1 tablet (100 mg total) by mouth daily.   memantine (NAMENDA) 5 MG tablet Take 1 tablet (5 mg at night) for 2 weeks, then increase to 1 tablet (5 mg) twice a day   metoprolol succinate (TOPROL XL) 25 MG 24 hr tablet Take 0.5 tablets (12.5 mg total) by mouth at bedtime.   Multiple Vitamins-Minerals (VITA-MIN PO) Take 1 tablet by mouth daily at 6 (six) AM. Beef Root Supplement,Pt takes 1000 mg 1 tablet daily.   Multiple Vitamins-Minerals (VITA-MIN PO) Take 1 tablet by mouth daily at 6 (six) AM. Garlic Supplement ,Pt takes 2355 mg 1 tablet daily.   sertraline (ZOLOFT) 25 MG tablet Take 1 tablet (25 mg total) by mouth daily.   tamsulosin (FLOMAX) 0.4 MG CAPS capsule Take 0.4 mg by mouth at bedtime.   triamcinolone cream (KENALOG) 0.1 % Apply 1 Application topically 2 (two) times daily as needed (dry skin).   vitamin B-12 (CYANOCOBALAMIN) 100 MCG tablet Take 1 tablet (100 mcg total) by mouth daily.   No facility-administered encounter medications on file as of 08/17/2023.    Allergies (verified) Pollen extract, Amlodipine, and  Brinzolamide-brimonidine   History: Past Medical History:  Diagnosis Date   Aneurysm (HCC)    per pt,in head   Arthritis    Chicken pox    Depression    Hypercholesteremia    Hypertension    Migraine    PTSD (post-traumatic stress disorder) 06/24/2017   Stroke (HCC)    No residual effects   Syphilis    Tuberculosis    Vitamin D deficiency    Past Surgical History:  Procedure Laterality Date   BUBBLE STUDY  08/15/2021   Procedure: BUBBLE STUDY;  Surgeon: Jake Bathe, MD;  Location: [facility];  Service: Cardiovascular;;   COLONOSCOPY     HERNIA REPAIR Right    x 2   TEE WITHOUT  CARDIOVERSION N/A 08/15/2021   Procedure: TRANSESOPHAGEAL ECHOCARDIOGRAM (TEE);  Surgeon: Jake Bathe, MD;  [facility] ENDOSCOPY;  Service: Cardiovascular;  Laterality: N/A;   Family History  Problem Relation Age of Onset   Dementia Mother    Healthy Father    Prostate cancer Brother    Social History   Socioeconomic History   Marital status: Widowed    Spouse name: Not on file   Number of children: 2   Years of education: 14   Highest education level: Not on file  Occupational History   Occupation: Retired  Tobacco Use   Smoking status: Former    Current packs/day: 0.00    Average packs/day: 0.5 packs/day for 50.0 years (25.0 ttl pk-yrs)    Types: Cigarettes    Start date: 10/30/1971    Quit date: 10/29/2021    Years since quitting: 1.8   Smokeless tobacco: Never  Vaping Use   Vaping status: Never Used  Substance and Sexual Activity   Alcohol use: Not Currently    Alcohol/week: 2.0 - 3.0 standard drinks of alcohol    Types: 2 - 3 Shots of liquor per week   Drug use: No   Sexual activity: Never  Other Topics Concern   Not on file  Social History Narrative   Fun: Sit on the back porch and relax.   Denies religious beliefs effecting health care.    Lives alone in a one story home.  Has 2 biological children and 3 adopted children.   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   Right handed   Drinks coffee   Social Drivers of Health   Financial Resource Strain: Low Risk  (08/17/2023)   Overall Financial Resource Strain (CARDIA)    Difficulty of Paying Living Expenses: Not hard at all  Food Insecurity: No Food Insecurity (08/17/2023)   Hunger Vital Sign    Worried About Running Out of Food in the Last Year: Never true    Ran Out of Food in the Last Year: Never true  Transportation Needs: No Transportation Needs (08/17/2023)   PRAPARE - Administrator, Civil Service (Medical): No    Lack of Transportation (Non-Medical): No  Physical Activity:  Inactive (08/17/2023)   Exercise Vital Sign    Days of Exercise per Week: 0 days    Minutes of Exercise per Session: 0 min  Stress: No Stress Concern Present (08/17/2023)   [employer] of Occupational Health - Occupational Stress Questionnaire    Feeling of Stress : Not at all  Social Connections: Socially Isolated (08/17/2023)   Social Connection and Isolation Panel [NHANES]    Frequency of Communication with Friends and Family: More than three times a week    Frequency  of Social Gatherings with Friends and Family: Three times a week    Attends Religious Services: Never    Active Member of Clubs or Organizations: No    Attends Banker Meetings: Never    Marital Status: Widowed    Tobacco Counseling Counseling given: Not Answered   Clinical Intake:  Pre-visit preparation completed: Yes  Pain : No/denies pain     BMI - recorded: 17.57 Nutritional Status: BMI <19  Underweight Nutritional Risks: None Diabetes: No  How often do you need to have someone help you when you read instructions, pamphlets, or other written materials from your doctor or pharmacy?: 1 - Never  Interpreter Needed?: No  Information entered by :: Drako Maese, RMA   Activities of Daily Living    08/17/2023   10:35 AM  In your present state of health, do you have any difficulty performing the following activities:  Hearing? 0  Vision? 0  Difficulty concentrating or making decisions? 0  Walking or climbing stairs? 1  Dressing or bathing? 0  Doing errands, shopping? 0  Comment can only drive in day  Preparing Food and eating ? N  Using the Toilet? N  In the past six months, have you accidently leaked urine? N  Do you have problems with loss of bowel control? N  Managing your Medications? N  Managing your Finances? N  Housekeeping or managing your Housekeeping? N    Patient Care Team: Corwin Levins, MD as PCP - General (Internal Medicine) [name], MD as PCP -  Cardiology (Cardiology) Iran Ouch, MD as PCP - [facility] Cardiology (Cardiology)  Indicate any recent Medical Services you may have received from other than [facility] providers in the past year (date may be approximate).     Assessment:   This is a routine wellness examination for Roberto Wallace.  Hearing/Vision screen Hearing Screening - Comments:: Denies hearing difficulties   Vision Screening - Comments:: Wears eyeglasses   Goals Addressed               This Visit's Progress     Patient Stated (pt-stated)        Not at this time      Depression Screen    08/17/2023   10:43 AM 05/07/2022   10:00 AM 04/07/2022    1:38 PM 10/30/2021   10:35 AM 10/30/2021   10:09 AM 09/23/2021   10:58 AM 01/09/2021    9:30 AM  PHQ 2/9 Scores  PHQ - 2 Score 0 0 0 0 0 3 0  PHQ- 9 Score 1 0   0 11     Fall Risk    08/17/2023   10:38 AM 12/09/2022    7:45 AM 05/07/2022   10:00 AM 04/07/2022    1:36 PM 10/30/2021   10:34 AM  Fall Risk   Falls in the past year? 0 0 0 0 0  Number falls in past yr: 0 0  0 0  Injury with Fall? 0 0 0 0 0  Risk for fall due to : No Fall Risks  No Fall Risks No Fall Risks   Follow up Falls prevention discussed;Falls evaluation completed Falls evaluation completed Falls evaluation completed Falls evaluation completed     MEDICARE RISK AT HOME: Medicare Risk at Home Any stairs in or around the home?: No Home free of loose throw rugs in walkways, pet beds, electrical cords, etc?: Yes Adequate lighting in your home to reduce risk of falls?: Yes Life alert?:  No Use of a cane, walker or w/c?: Yes Grab bars in the bathroom?: Yes (just on the wall not in tub area) Shower chair or bench in shower?: No Elevated toilet seat or a handicapped toilet?: No  TIMED UP AND GO:  Was the test performed?  No    Cognitive Function:    04/07/2022    1:58 PM  MMSE - Mini Mental State Exam  Not completed: Unable to complete      12/09/2022    8:00 AM 07/31/2016    9:04 AM 10/30/2015    11:15 AM 10/30/2015   10:01 AM  Montreal Cognitive Assessment   Visuospatial/ Executive (0/5) 3 5  5   Naming (0/3) 2 2  3   Attention: Read list of digits (0/2) 2 1  2   Attention: Read list of letters (0/1) 1 1  1   Attention: Serial 7 subtraction starting at 100 (0/3) 0 0 2 1  Language: Repeat phrase (0/2) 1 2  2   Language : Fluency (0/1) 1 1  0  Abstraction (0/2) 2 2  2   Delayed Recall (0/5) 0 0  1  Orientation (0/6) 5 6  6   Total 17 20  23   Adjusted Score (based on education) 17 20        08/17/2023   10:31 AM 04/09/2022   12:17 PM  6CIT Screen  What Year? 0 points 0 points  What month? 0 points 0 points  What time? 0 points 0 points  Count back from 20 0 points 0 points  Months in reverse 4 points 0 points  Repeat phrase 6 points 0 points  Total Score 10 points 0 points    Immunizations Immunization History  Administered Date(s) Administered   Fluad Quad(high Dose 65+) 06/01/2023   Influenza, High Dose Seasonal PF 04/28/2017, 05/27/2018   Influenza-Unspecified 06/25/2002, 08/06/2016, 05/27/2018, 05/27/2021   Janssen (J&J) SARS-COV-2 Vaccination 12/02/2019   Moderna Sars-Covid-2 Vaccination 02/28/2021   PFIZER(Purple Top)SARS-COV-2 Vaccination 06/11/2021   Pneumococcal Conjugate-13 10/12/2013, 04/16/2015, 08/06/2016   Pneumococcal Polysaccharide-23 07/04/2009, 04/28/2017   Td 04/16/2015   Tdap 11/18/2011   Zoster Recombinant(Shingrix) 12/26/2019, 02/23/2020   Zoster, Live 05/19/2015    TDAP status: Up to date  Flu Vaccine status: Up to date  Pneumococcal vaccine status: Up to date  Covid-19 vaccine status: Completed vaccines  Qualifies for Shingles Vaccine? Yes   Zostavax completed Yes   Shingrix Completed?: Yes  Screening Tests Health Maintenance  Topic Date Due   Lung Cancer Screening  10/22/2022   Medicare Annual Wellness (AWV)  08/16/2024   DTaP/Tdap/Td (3 - Td or Tdap) 04/15/2025   Pneumonia Vaccine 53+ Years old  Completed   INFLUENZA VACCINE   Completed   Zoster Vaccines- Shingrix  Completed   HPV VACCINES  Aged Out   Colonoscopy  Discontinued   COVID-19 Vaccine  Discontinued   Hepatitis C Screening  Discontinued    Health Maintenance  Health Maintenance Due  Topic Date Due   Lung Cancer Screening  10/22/2022    Colorectal cancer screening: No longer required.   Lung Cancer Screening: (Low Dose CT Chest recommended if Age 24-80 years, 20 pack-year currently smoking OR have quit w/in 15years.) does qualify.   Lung Cancer Screening Referral: 08/17/2023  Additional Screening:  Hepatitis C Screening: does qualify; Completed 04/16/2015  Vision Screening: Recommended annual ophthalmology exams for early detection of glaucoma and other disorders of the eye. Is the patient up to date with their annual eye exam?  Yes  Who  is the provider or what is the [facility] of the [facility] which the [facility]  If pt is not established with a provider, would they like to be referred to a provider to establish care? Yes .   Dental Screening: Recommended annual dental exams for proper oral hygiene   Community Resource Referral / Chronic Care Management: CRR required this visit?  No   CCM required this visit?  No     Plan:     I have personally reviewed and noted the following in the patient's chart:   Medical and social history Use of alcohol, tobacco or illicit drugs  Current medications and supplements including opioid prescriptions. Patient is not currently taking opioid prescriptions. Functional ability and status Nutritional status Physical activity Advanced directives List of other physicians Hospitalizations, surgeries, and ER visits in previous 12 months Vitals Screenings to include cognitive, depression, and falls Referrals and appointments  In addition, I have reviewed and discussed with patient certain preventive protocols, quality metrics, and best practice recommendations. A written  personalized care plan for preventive services as well as general preventive health recommendations were provided to patient.     Aiana Nordquist L Ramiel Forti, CMA   08/17/2023   After Visit Summary: (MyChart) Due to this being a telephonic visit, the after visit summary with patients personalized plan was offered to patient via MyChart   Nurse Notes: Patient is due for a ling cancer screening.  He is up to date with all other health maintenance.  He has no concerns to address today.

## 2023-08-17 NOTE — Patient Instructions (Addendum)
Roberto Wallace , Thank you for taking time to come for your Medicare Wellness Visit. I appreciate your ongoing commitment to your health goals. Please review the following plan we discussed and let me know if I can assist you in the future.   Referrals/Orders/Follow-Ups/Clinician Recommendations: Remember to call [facility], to schedule your lung screening.  [phone].  It was nice talking to you today and have a Merry Christmas.  This is a list of the screening recommended for you and due dates:  Health Maintenance  Topic Date Due   Screening for Lung Cancer  10/22/2022   Medicare Annual Wellness Visit  08/16/2024   DTaP/Tdap/Td vaccine (3 - Td or Tdap) 04/15/2025   Pneumonia Vaccine  Completed   Flu Shot  Completed   Zoster (Shingles) Vaccine  Completed   HPV Vaccine  Aged Out   Colon Cancer Screening  Discontinued   COVID-19 Vaccine  Discontinued   Hepatitis C Screening  Discontinued    Advanced directives: (Copy Requested) Please bring a copy of your health care power of attorney and living will to the office to be added to your chart at your convenience.  Next Medicare Annual Wellness Visit scheduled for next year: Yes

## 2023-09-01 ENCOUNTER — Other Ambulatory Visit: Payer: Self-pay

## 2023-09-01 DIAGNOSIS — E785 Hyperlipidemia, unspecified: Secondary | ICD-10-CM | POA: Diagnosis not present

## 2023-09-01 LAB — LIPID PANEL
Chol/HDL Ratio: 4.2 {ratio} (ref 0.0–5.0)
Cholesterol, Total: 177 mg/dL (ref 100–199)
HDL: 42 mg/dL (ref >39)
LDL Chol Calc (NIH): 121 mg/dL — ABNORMAL HIGH (ref 0–99)
Triglycerides: 74 mg/dL (ref 0–149)
VLDL Cholesterol Cal: 14 mg/dL (ref 5–40)

## 2023-09-01 LAB — HEPATIC FUNCTION PANEL
ALT: 8 [IU]/L (ref 0–44)
AST: 11 [IU]/L (ref 0–40)
Albumin: 4.7 g/dL (ref 3.8–4.8)
Alkaline Phosphatase: 82 [IU]/L (ref 44–121)
Bilirubin Total: 0.3 mg/dL (ref 0.0–1.2)
Bilirubin, Direct: 0.12 mg/dL (ref 0.00–0.40)
Total Protein: 7.4 g/dL (ref 6.0–8.5)

## 2023-09-09 ENCOUNTER — Other Ambulatory Visit (HOSPITAL_COMMUNITY): Payer: Self-pay

## 2023-09-09 ENCOUNTER — Telehealth: Payer: Self-pay | Admitting: *Deleted

## 2023-09-09 ENCOUNTER — Other Ambulatory Visit: Payer: Self-pay | Admitting: *Deleted

## 2023-09-09 DIAGNOSIS — E785 Hyperlipidemia, unspecified: Secondary | ICD-10-CM

## 2023-09-09 MED ORDER — EZETIMIBE 10 MG PO TABS
10.0000 mg | ORAL_TABLET | Freq: Every day | ORAL | 3 refills | Status: DC
  Filled 2023-09-09: qty 90, 90d supply, fill #0

## 2023-09-09 NOTE — Telephone Encounter (Signed)
 Reviewed with the patient.  He will continue atorvastatin  80 and begin Zetia  10 mg daily and go to [facility] mid March for repeat labs.

## 2023-09-09 NOTE — Telephone Encounter (Signed)
-----   Message from Arun K Thukkani sent at 09/02/2023  6:22 AM EST ----- Add Zetia  10 mg and check lipid panel and LFTs in 2 months.

## 2023-09-21 ENCOUNTER — Other Ambulatory Visit (HOSPITAL_COMMUNITY): Payer: Self-pay

## 2023-11-18 LAB — LAB REPORT - SCANNED: EGFR: 57

## 2023-12-29 ENCOUNTER — Encounter: Payer: Self-pay | Admitting: Cardiovascular Disease

## 2023-12-29 ENCOUNTER — Ambulatory Visit: Attending: Cardiovascular Disease | Admitting: Cardiovascular Disease

## 2023-12-29 VITALS — BP 114/70 | HR 88 | Ht 69.0 in | Wt 119.0 lb

## 2023-12-29 DIAGNOSIS — I10 Essential (primary) hypertension: Secondary | ICD-10-CM | POA: Diagnosis not present

## 2023-12-29 DIAGNOSIS — I739 Peripheral vascular disease, unspecified: Secondary | ICD-10-CM | POA: Diagnosis not present

## 2023-12-29 DIAGNOSIS — Z720 Tobacco use: Secondary | ICD-10-CM | POA: Diagnosis not present

## 2023-12-29 DIAGNOSIS — E785 Hyperlipidemia, unspecified: Secondary | ICD-10-CM

## 2023-12-29 NOTE — Progress Notes (Signed)
 Cardiology Office Note   Date:  12/29/2023   ID:  Roberto Sanger [name], DOB 12/10/1940, MRN [id number]  PCP:  Roberto Coombe, MD  Cardiologist:  Dr. Lorie Rook  No chief complaint on file.     History of Present Illness: Roberto Wallace. is a 83 y.o. male who for follow-up visit regarding peripheral arterial disease.    He has severe arthritis with reduced functional capacity and he uses a wheelchair most of the time.  He has chronic medical conditions that include hyperlipidemia, essential hypertension, PTSD, previous stroke and vitamin D  deficiency.  He is known to have peripheral arterial disease with mildly reduced ABI bilaterally and negative leg symptoms.  Noninvasive vascular evaluation in 2023 showed an ABI of 0.72 on the right and 0.79 on the left.  Toe pressure was absent.  Duplex showed likely an occluded right popliteal artery and occluded distal left SFA. He has been doing reasonably well and denies chest pain or worsening dyspnea.  He has no calf claudication or lower extremity ulceration.  He cut down on tobacco use but has not been able to quit smoking.  Past Medical History:  Diagnosis Date   Aneurysm (HCC)    per pt,in head   Arthritis    Chicken pox    Depression    Hypercholesteremia    Hypertension    Migraine    PTSD (post-traumatic stress disorder) 06/24/2017   Stroke (HCC)    No residual effects   Syphilis    Tuberculosis    Vitamin D  deficiency     Past Surgical History:  Procedure Laterality Date   BUBBLE STUDY  08/15/2021   Procedure: BUBBLE STUDY;  Surgeon: Hugh Madura, MD;  Location: [facility] ENDOSCOPY;  Service: Cardiovascular;;   COLONOSCOPY     HERNIA REPAIR Right    x 2   TEE WITHOUT CARDIOVERSION N/A 08/15/2021   Procedure: TRANSESOPHAGEAL ECHOCARDIOGRAM (TEE);  Surgeon: Hugh Madura, MD;  Location: [facility] ENDOSCOPY;  Service: Cardiovascular;  Laterality: N/A;     Current Outpatient Medications  Medication Sig Dispense Refill    Apoaequorin (PREVAGEN) 10 MG CAPS Take 1 tablet by mouth daily.     aspirin  EC 81 MG tablet Take 1 tablet (81 mg total) by mouth daily. Swallow whole. 30 tablet 12   atorvastatin  (LIPITOR ) 80 MG tablet Take 1 tablet (80 mg total) by mouth daily. 90 tablet 3   Cholecalciferol (THERA-D 2000) 50 MCG (2000 UT) TABS 1 tab by mouth once daily 90 tablet 99   ezetimibe  (ZETIA ) 10 MG tablet Take 1 tablet (10 mg total) by mouth daily. 90 tablet 3   losartan  (COZAAR ) 100 MG tablet Take 1 tablet (100 mg total) by mouth daily. 90 tablet 3   memantine  (NAMENDA ) 5 MG tablet Take 1 tablet (5 mg at night) for 2 weeks, then increase to 1 tablet (5 mg) twice a day 60 tablet 11   metoprolol  succinate (TOPROL  XL) 25 MG 24 hr tablet Take 0.5 tablets (12.5 mg total) by mouth at bedtime. 30 tablet 1   Multiple Vitamins-Minerals (VITA-MIN PO) Take 1 tablet by mouth daily at 6 (six) AM. Beef Root Supplement,Pt takes 1000 mg 1 tablet daily.     Multiple Vitamins-Minerals (VITA-MIN PO) Take 1 tablet by mouth daily at 6 (six) AM. Garlic Supplement ,Pt takes 1000 mg 1 tablet daily.     sertraline  (ZOLOFT ) 25 MG tablet Take 1 tablet (25 mg total) by mouth daily. 90 tablet 3  tamsulosin (FLOMAX) 0.4 MG CAPS capsule Take 0.4 mg by mouth at bedtime.     triamcinolone cream (KENALOG) 0.1 % Apply 1 Application topically 2 (two) times daily as needed (dry skin).     vitamin B-12 (CYANOCOBALAMIN ) 100 MCG tablet Take 1 tablet (100 mcg total) by mouth daily. 90 tablet 3   No current facility-administered medications for this visit.    Allergies:   Pollen extract, Amlodipine, and Brinzolamide-brimonidine    Social History:  The patient  reports that he quit smoking about 2 years ago. His smoking use included cigarettes. He started smoking about 52 years ago. He has a 25 pack-year smoking history. He has never used smokeless tobacco. He reports that he does not currently use alcohol after a past usage of about 2.0 - 3.0 standard drinks  of alcohol per week. He reports that he does not use drugs.   Family History:  The patient's family history includes Dementia in his mother; Healthy in his father; Prostate cancer in his brother.    ROS:  Please see the history of present illness.   Otherwise, review of systems are positive for none.   All other systems are reviewed and negative.    PHYSICAL EXAM: VS:  BP 114/70 (BP Location: Left Arm, Patient Position: Sitting)   Pulse 88   Ht 5\' 9"  (1.753 m)   Wt 119 lb (54 kg)   SpO2 (!) 89%   BMI 17.57 kg/m  , BMI Body mass index is 17.57 kg/m. GEN: Well nourished, well developed, in no acute distress  HEENT: normal  Neck: no JVD, carotid bruits, or masses Cardiac: RRR; no murmurs, rubs, or gallops,no edema  Respiratory:  clear to auscultation bilaterally, normal work of breathing GI: soft, nontender, nondistended, + BS MS: no deformity or atrophy  Skin: warm and dry, no rash Neuro:  Strength and sensation are intact Psych: euthymic mood, full affect   EKG:  EKG ordered today. EKG showed: Normal sinus rhythm Normal ECG    Recent Labs: 09/01/2023: ALT 8    Lipid Panel    Component Value Date/Time   CHOL 177 09/01/2023 0843   TRIG 74 09/01/2023 0843   HDL 42 09/01/2023 0843   CHOLHDL 4.2 09/01/2023 0843   CHOLHDL 2 12/12/2020 1426   VLDL 11.6 12/12/2020 1426   LDLCALC 121 (H) 09/01/2023 0843      Wt Readings from Last 3 Encounters:  12/29/23 119 lb (54 kg)  08/17/23 119 lb (54 kg)  06/16/23 115 lb 6.4 oz (52.3 kg)          No data to display            ASSESSMENT AND PLAN:  1.  Peripheral arterial disease: The patient has evidence of peripheral arterial disease with bilateral SFA popliteal disease and possible occlusion with moderately reduced ABI.  However, he has no lifestyle limiting claudication likely due to reduced physical capacity related to his severe arthritis as he is mostly wheelchair-bound.  No evidence of critical limb  ischemia.  2.  Hyperlipidemia: Continue treatment with atorvastatin .  Recommend a target LDL of less than 70.  Ezetimibe  was added by Dr. Lorie Rook.  He had follow-up labs done at the [place] which showed an LDL of 97 and triglyceride of 82.  He misses his taking his medication sometimes.  I discussed with him the importance of compliance.  3.  Essential hypertension: Blood pressure is well controlled on medications.  4.  Tobacco use: He cut down  to few cigarettes a day.  I discussed with him the importance of complete cessation.    Disposition:   FU with me in 1 year  Signed,  Antionette Kirks, MD  12/29/2023 8:53 AM    [facility]

## 2023-12-29 NOTE — Patient Instructions (Signed)
 Medication Instructions:  Your physician recommends that you continue on your current medications as directed. Please refer to the Current Medication list given to you today.  *If you need a refill on your cardiac medications before your next appointment, please call your pharmacy*  Lab Work: NONE ordered at this time of appointment    Testing/Procedures: NONE ordered at this time of appointment    Follow-Up: At [facility], you and your health needs are our priority.  As part of our continuing mission to provide you with exceptional heart care, our providers are all part of one team.  This team includes your primary Cardiologist (physician) and Advanced Practice Providers or APPs (Physician Assistants and Nurse Practitioners) who all work together to provide you with the care you need, when you need it.  Your next appointment:   1 year(s)  Provider:   Dr. Alvenia Aus    We recommend signing up for the patient portal called "MyChart".  Sign up information is provided on this After Visit Summary.  MyChart is used to connect with patients for Virtual Visits (Telemedicine).  Patients are able to view lab/test results, encounter notes, upcoming appointments, etc.  Non-urgent messages can be sent to your provider as well.   To learn more about what you can do with MyChart, go to [email or web address].

## 2024-01-07 ENCOUNTER — Other Ambulatory Visit (HOSPITAL_COMMUNITY): Payer: Self-pay | Admitting: Physician Assistant

## 2024-01-07 DIAGNOSIS — R911 Solitary pulmonary nodule: Secondary | ICD-10-CM

## 2024-01-12 ENCOUNTER — Encounter: Payer: Self-pay | Admitting: Acute Care

## 2024-01-14 ENCOUNTER — Encounter: Payer: Self-pay | Admitting: Internal Medicine

## 2024-01-14 ENCOUNTER — Ambulatory Visit (INDEPENDENT_AMBULATORY_CARE_PROVIDER_SITE_OTHER): Admitting: Internal Medicine

## 2024-01-14 ENCOUNTER — Ambulatory Visit: Payer: Self-pay | Admitting: Internal Medicine

## 2024-01-14 ENCOUNTER — Telehealth: Payer: Self-pay

## 2024-01-14 VITALS — BP 128/84 | HR 85 | Temp 98.1°F | Ht 69.0 in | Wt 112.0 lb

## 2024-01-14 DIAGNOSIS — R739 Hyperglycemia, unspecified: Secondary | ICD-10-CM | POA: Diagnosis not present

## 2024-01-14 DIAGNOSIS — E440 Moderate protein-calorie malnutrition: Secondary | ICD-10-CM | POA: Diagnosis not present

## 2024-01-14 DIAGNOSIS — E7800 Pure hypercholesterolemia, unspecified: Secondary | ICD-10-CM | POA: Diagnosis not present

## 2024-01-14 DIAGNOSIS — Z0000 Encounter for general adult medical examination without abnormal findings: Secondary | ICD-10-CM

## 2024-01-14 DIAGNOSIS — M75111 Incomplete rotator cuff tear or rupture of right shoulder, not specified as traumatic: Secondary | ICD-10-CM | POA: Insufficient documentation

## 2024-01-14 DIAGNOSIS — I10 Essential (primary) hypertension: Secondary | ICD-10-CM

## 2024-01-14 DIAGNOSIS — E538 Deficiency of other specified B group vitamins: Secondary | ICD-10-CM | POA: Diagnosis not present

## 2024-01-14 DIAGNOSIS — K08131 Complete loss of teeth due to caries, class I: Secondary | ICD-10-CM | POA: Insufficient documentation

## 2024-01-14 DIAGNOSIS — E559 Vitamin D deficiency, unspecified: Secondary | ICD-10-CM

## 2024-01-14 DIAGNOSIS — Z0001 Encounter for general adult medical examination with abnormal findings: Secondary | ICD-10-CM

## 2024-01-14 DIAGNOSIS — R2689 Other abnormalities of gait and mobility: Secondary | ICD-10-CM | POA: Insufficient documentation

## 2024-01-14 DIAGNOSIS — J449 Chronic obstructive pulmonary disease, unspecified: Secondary | ICD-10-CM | POA: Insufficient documentation

## 2024-01-14 LAB — LIPID PANEL
Cholesterol: 162 mg/dL (ref 0–200)
HDL: 42.5 mg/dL (ref >39.00)
LDL Cholesterol: 96 mg/dL (ref 0–99)
NonHDL: 119.28
Total CHOL/HDL Ratio: 4
Triglycerides: 115 mg/dL (ref 0.0–149.0)
VLDL: 23 mg/dL (ref 0.0–40.0)

## 2024-01-14 LAB — HEPATIC FUNCTION PANEL
ALT: 8 U/L (ref 0–53)
AST: 10 U/L (ref 0–37)
Albumin: 4.3 g/dL (ref 3.5–5.2)
Alkaline Phosphatase: 81 U/L (ref 39–117)
Bilirubin, Direct: 0.1 mg/dL (ref 0.0–0.3)
Total Bilirubin: 0.4 mg/dL (ref 0.2–1.2)
Total Protein: 7.7 g/dL (ref 6.0–8.3)

## 2024-01-14 LAB — CBC WITH DIFFERENTIAL/PLATELET
Basophils Absolute: 0 10*3/uL (ref 0.0–0.1)
Basophils Relative: 0.4 % (ref 0.0–3.0)
Eosinophils Absolute: 0.5 10*3/uL (ref 0.0–0.7)
Eosinophils Relative: 7.7 % — ABNORMAL HIGH (ref 0.0–5.0)
HCT: 38.4 % — ABNORMAL LOW (ref 39.0–52.0)
Hemoglobin: 12.5 g/dL — ABNORMAL LOW (ref 13.0–17.0)
Lymphocytes Relative: 26.2 % (ref 12.0–46.0)
Lymphs Abs: 1.5 10*3/uL (ref 0.7–4.0)
MCHC: 32.6 g/dL (ref 30.0–36.0)
MCV: 86.2 fl (ref 78.0–100.0)
Monocytes Absolute: 0.5 10*3/uL (ref 0.1–1.0)
Monocytes Relative: 8.3 % (ref 3.0–12.0)
Neutro Abs: 3.4 10*3/uL (ref 1.4–7.7)
Neutrophils Relative %: 57.4 % (ref 43.0–77.0)
Platelets: 214 10*3/uL (ref 150.0–400.0)
RBC: 4.46 Mil/uL (ref 4.22–5.81)
RDW: 13.8 % (ref 11.5–15.5)
WBC: 5.9 10*3/uL (ref 4.0–10.5)

## 2024-01-14 LAB — BASIC METABOLIC PANEL WITH GFR
BUN: 14 mg/dL (ref 6–23)
CO2: 32 meq/L (ref 19–32)
Calcium: 9.1 mg/dL (ref 8.4–10.5)
Chloride: 102 meq/L (ref 96–112)
Creatinine, Ser: 1.17 mg/dL (ref 0.40–1.50)
GFR: 58.91 mL/min — ABNORMAL LOW (ref >60.00)
Glucose, Bld: 114 mg/dL — ABNORMAL HIGH (ref 70–99)
Potassium: 4.3 meq/L (ref 3.5–5.1)
Sodium: 139 meq/L (ref 135–145)

## 2024-01-14 LAB — VITAMIN D 25 HYDROXY (VIT D DEFICIENCY, FRACTURES): VITD: 41.25 ng/mL (ref 30.00–100.00)

## 2024-01-14 LAB — VITAMIN B12: Vitamin B-12: 606 pg/mL (ref 211–911)

## 2024-01-14 LAB — HEMOGLOBIN A1C: Hgb A1c MFr Bld: 5.7 % (ref 4.6–6.5)

## 2024-01-14 LAB — TSH: TSH: 0.62 u[IU]/mL (ref 0.35–5.50)

## 2024-01-14 NOTE — Progress Notes (Signed)
 The test results show that your current treatment is OK, as the tests are stable.  Please continue the same plan.  There is no other need for change of treatment or further evaluation based on these results, at this time.  thanks

## 2024-01-14 NOTE — Patient Instructions (Signed)
 You will be contacted regarding the referral for: help with food insecurity  Please continue all other medications as before, and refills have been done if requested.  Please have the pharmacy call with any other refills you may need.  Please continue your efforts at being more active, low cholesterol diet, and weight control.  You are otherwise up to date with prevention measures today.  Please keep your appointments with your specialists as you may have planned  Please go to the LAB at the blood drawing area for the tests to be done  You will be contacted by phone if any changes need to be made immediately.  Otherwise, you will receive a letter about your results with an explanation, but please check with MyChart first.  Please make an Appointment to return in 6 months, or sooner if needed

## 2024-01-14 NOTE — Progress Notes (Signed)
 Patient ID: Roberto Maize., male   DOB: 06-07-1941, 83 y.o.   MRN: [id number]         Chief Complaint:: wellness exam and food insecurity malnutrition, hld, hyperglycemi, low vit d and b12       HPI:  Roberto Roddy. is a 83 y.o. male here for wellness exam; up to date                        Also has f/u appt at [facility] may [facility] for CT chest lung cancer screen. .  No further ETOH use.   Has chronic LBP with DDD and high risk for surgury so this was not recommended per pt. Has lost wt with food insecurity.  Pt denies chest pain, increased sob or doe, wheezing, orthopnea, PND, increased LE swelling, palpitations, dizziness or syncope.   Pt denies polydipsia, polyuria, or new focal neuro s/s.    Pt denies fever, wt loss, night sweats, loss of appetite, or other constitutional symptoms     Wt Readings from Last 3 Encounters:  01/14/24 112 lb (50.8 kg)  12/29/23 119 lb (54 kg)  08/17/23 119 lb (54 kg)   BP Readings from Last 3 Encounters:  01/14/24 128/84  12/29/23 114/70  06/16/23 120/80   Immunization History  Administered Date(s) Administered   Fluad Quad(high Dose 65+) 06/01/2023   Influenza, High Dose Seasonal PF 04/28/2017, 05/27/2018, 06/01/2023   Influenza-Unspecified 06/25/2002, 08/06/2016, 05/27/2018, 05/27/2021, 06/11/2021   Janssen (J&J) SARS-COV-2 Vaccination 12/02/2019, 12/23/2019   Moderna Sars-Covid-2 Vaccination 02/28/2021   PFIZER(Purple Top)SARS-COV-2 Vaccination 06/11/2021   PNEUMOCOCCAL CONJUGATE-20 03/10/2022   Pfizer Covid-19 Vaccine Bivalent Booster 56yrs & up 06/11/2021   Pneumococcal Conjugate-13 10/12/2013, 04/16/2015, 08/06/2016   Pneumococcal Polysaccharide-23 07/04/2009, 04/28/2017, 06/03/2021   Respiratory Syncytial Virus Vaccine,Recomb Aduvanted(Arexvy) 05/24/2022   Td 04/16/2015   Tdap 11/18/2011, 06/01/2023   Zoster Recombinant(Shingrix) 12/26/2019, 02/23/2020, 06/07/2020, 06/01/2023   Zoster, Live 05/19/2015   Health Maintenance Due  Topic  Date Due   Lung Cancer Screening  10/22/2022      Past Medical History:  Diagnosis Date   Aneurysm (HCC)    per pt,in head   Arthritis    Chicken pox    Depression    Hypercholesteremia    Hypertension    Migraine    PTSD (post-traumatic stress disorder) 06/24/2017   Stroke (HCC)    No residual effects   Syphilis    Tuberculosis    Vitamin D  deficiency    Past Surgical History:  Procedure Laterality Date   BUBBLE STUDY  08/15/2021   Procedure: BUBBLE STUDY;  Surgeon: Hugh Madura, MD;  Location: [facility] ENDOSCOPY;  Service: Cardiovascular;;   COLONOSCOPY     HERNIA REPAIR Right    x 2   TEE WITHOUT CARDIOVERSION N/A 08/15/2021   Procedure: TRANSESOPHAGEAL ECHOCARDIOGRAM (TEE);  Surgeon: Hugh Madura, MD;  Location: [facility] ENDOSCOPY;  Service: Cardiovascular;  Laterality: N/A;    reports that he quit smoking about 2 years ago. His smoking use included cigarettes. He started smoking about 52 years ago. He has a 25 pack-year smoking history. He has never used smokeless tobacco. He reports that he does not currently use alcohol after a past usage of about 2.0 - 3.0 standard drinks of alcohol per week. He reports that he does not use drugs. family history includes Dementia in his mother; Healthy in his father; Prostate cancer in his brother. Allergies  Allergen Reactions   Pollen  Extract     Other reaction(s): Sinusitis   Amlodipine Rash   Brinzolamide-Brimonidine Other (See Comments)   Current Outpatient Medications on File Prior to Visit  Medication Sig Dispense Refill   Apoaequorin (PREVAGEN) 10 MG CAPS Take 1 tablet by mouth daily.     aspirin  EC 81 MG tablet Take 1 tablet (81 mg total) by mouth daily. Swallow whole. 30 tablet 12   atorvastatin  (LIPITOR ) 80 MG tablet Take 1 tablet (80 mg total) by mouth daily. 90 tablet 3   Cholecalciferol (THERA-D 2000) 50 MCG (2000 UT) TABS 1 tab by mouth once daily 90 tablet 99   ezetimibe  (ZETIA ) 10 MG tablet Take 1 tablet (10 mg total)  by mouth daily. 90 tablet 3   fexofenadine  (ALLEGRA ) 180 MG tablet 180 mg.     losartan  (COZAAR ) 100 MG tablet Take 1 tablet (100 mg total) by mouth daily. 90 tablet 3   memantine  (NAMENDA ) 5 MG tablet Take 1 tablet (5 mg at night) for 2 weeks, then increase to 1 tablet (5 mg) twice a day 60 tablet 11   metoprolol  succinate (TOPROL  XL) 25 MG 24 hr tablet Take 0.5 tablets (12.5 mg total) by mouth at bedtime. 30 tablet 1   Multiple Vitamins-Minerals (VITA-MIN PO) Take 1 tablet by mouth daily at 6 (six) AM. Beef Root Supplement,Pt takes 1000 mg 1 tablet daily.     Multiple Vitamins-Minerals (VITA-MIN PO) Take 1 tablet by mouth daily at 6 (six) AM. Garlic Supplement ,Pt takes 1000 mg 1 tablet daily.     sertraline  (ZOLOFT ) 25 MG tablet Take 1 tablet (25 mg total) by mouth daily. 90 tablet 3   tamsulosin (FLOMAX) 0.4 MG CAPS capsule Take 0.4 mg by mouth at bedtime.     Tiotropium Bromide Monohydrate 1.25 MCG/ACT AERS Take by mouth.     triamcinolone cream (KENALOG) 0.1 % Apply 1 Application topically 2 (two) times daily as needed (dry skin).     vitamin B-12 (CYANOCOBALAMIN ) 100 MCG tablet Take 1 tablet (100 mcg total) by mouth daily. 90 tablet 3   No current facility-administered medications on file prior to visit.        ROS:  All others reviewed and negative.  Objective        PE:  BP 128/84 (BP Location: Left Arm, Patient Position: Sitting, Cuff Size: Normal)   Pulse 85   Temp 98.1 F (36.7 C) (Oral)   Ht 5\' 9"  (1.753 m)   Wt 112 lb (50.8 kg)   SpO2 99%   BMI 16.54 kg/m                 Constitutional: Pt appears in NAD in hoverround               HENT: Head: NCAT.                Right Ear: External ear normal.                 Left Ear: External ear normal.                Eyes: . Pupils are equal, round, and reactive to light. Conjunctivae and EOM are normal               Nose: without d/c or deformity               Neck: Neck supple. Gross normal ROM  Cardiovascular:  Normal rate and regular rhythm.                 Pulmonary/Chest: Effort normal and breath sounds without rales or wheezing.                Abd:  Soft, NT, ND, + BS, no organomegaly               Neurological: Pt is alert. At baseline orientation, motor grossly intact               Skin: Skin is warm. No rashes, no other new lesions, LE edema - none               Psychiatric: Pt behavior is normal without agitation   Micro: none  Cardiac tracings I have personally interpreted today:  none  Pertinent Radiological findings (summarize): none   Lab Results  Component Value Date   WBC 5.9 01/14/2024   HGB 12.5 (L) 01/14/2024   HCT 38.4 (L) 01/14/2024   PLT 214.0 01/14/2024   GLUCOSE 114 (H) 01/14/2024   CHOL 162 01/14/2024   TRIG 115.0 01/14/2024   HDL 42.50 01/14/2024   LDLCALC 96 01/14/2024   ALT 8 01/14/2024   AST 10 01/14/2024   NA 139 01/14/2024   K 4.3 01/14/2024   CL 102 01/14/2024   CREATININE 1.17 01/14/2024   BUN 14 01/14/2024   CO2 32 01/14/2024   TSH 0.62 01/14/2024   PSA 2.77 12/12/2020   INR 0.99 10/16/2017   HGBA1C 5.7 01/14/2024   Assessment/Plan:  Roberto RUDY [name] is a 83 y.o. Black or African American [2] male with  has a past medical history of Aneurysm (HCC), Arthritis, Chicken pox, Depression, Hypercholesteremia, Hypertension, Migraine, PTSD (post-traumatic stress disorder) (06/24/2017), Stroke (HCC), Syphilis, Tuberculosis, and Vitamin D  deficiency.  Encounter for well adult exam with abnormal findings Age and sex appropriate education and counseling updated with regular exercise and diet Referrals for preventative services - none needed Immunizations addressed - none needed Smoking counseling  - none needed Evidence for depression or other mood disorder - none significant Most recent labs reviewed. I have personally reviewed and have noted: 1) the patient's medical and social history 2) The patient's current medications and supplements 3) The  patient's height, weight, and BMI have been recorded in the chart   HYPERTENSION, BENIGN ESSENTIAL BP Readings from Last 3 Encounters:  01/14/24 128/84  12/29/23 114/70  06/16/23 120/80   Stable, pt to continue medical treatment losartan  100 mg every day, toprol  xl 12.5 qd   Hyperlipidemia Lab Results  Component Value Date   LDLCALC 96 01/14/2024   Stable, pt to continue current statin lipitor  80 mg every day, zetia  10 mg qd   Hyperglycemia Lab Results  Component Value Date   HGBA1C 5.7 01/14/2024   Stable, pt to continue current medical treatment  - diet, wt control   Vitamin D  deficiency Last vitamin D  Lab Results  Component Value Date   VD25OH 41.25 01/14/2024   Stable, cont oral replacement   B12 deficiency Lab Results  Component Value Date   VITAMINB12 606 01/14/2024   Stable, cont oral replacement - b12 1000 mcg qd   Moderate protein-calorie malnutrition (HCC) With food insecurity - for SDOH referral  Followup: Return in about 6 months (around 07/16/2024).  Rosalia Colonel, MD 01/15/2024 9:12 PM [facility]

## 2024-01-14 NOTE — Progress Notes (Signed)
   Telephone encounter was:  Successful.  Complex Care Management Note Care Guide Note  01/14/2024 Name: Roberto JESSON [name] MRN: [id number] DOB: 1940-10-26  [name] is a 83 y.o. year old male who is a primary care patient of Autry Legions, Alveda Aures, MD . The community resource team was consulted for assistance with Food Insecurity  SDOH screenings and interventions completed:  No        Care guide performed the following interventions: Patient provided with information about care guide support team and interviewed to confirm resource needs.  Follow Up Plan:  Care guide will follow up with patient by phone over the next week  Encounter Outcome:  Patient Visit Completed   [name] [facility]  [facility] Guide, Phone: [phone] Fax: [phone] Website: [email or web address]

## 2024-01-15 ENCOUNTER — Encounter: Payer: Self-pay | Admitting: Internal Medicine

## 2024-01-15 DIAGNOSIS — E440 Moderate protein-calorie malnutrition: Secondary | ICD-10-CM | POA: Insufficient documentation

## 2024-01-15 NOTE — Assessment & Plan Note (Signed)
 BP Readings from Last 3 Encounters:  01/14/24 128/84  12/29/23 114/70  06/16/23 120/80   Stable, pt to continue medical treatment losartan  100 mg every day, toprol  xl 12.5 qd

## 2024-01-15 NOTE — Assessment & Plan Note (Signed)
 Lab Results  Component Value Date   HGBA1C 5.7 01/14/2024   Stable, pt to continue current medical treatment  - diet, wt control

## 2024-01-15 NOTE — Assessment & Plan Note (Signed)
 With food insecurity - for SDOH referral

## 2024-01-15 NOTE — Assessment & Plan Note (Signed)

## 2024-01-15 NOTE — Assessment & Plan Note (Signed)
 Lab Results  Component Value Date   VITAMINB12 606 01/14/2024   Stable, cont oral replacement - b12 1000 mcg qd

## 2024-01-15 NOTE — Assessment & Plan Note (Signed)
 Lab Results  Component Value Date   LDLCALC 96 01/14/2024   Stable, pt to continue current statin lipitor  80 mg every day, zetia  10 mg qd

## 2024-01-15 NOTE — Assessment & Plan Note (Signed)
 Last vitamin D  Lab Results  Component Value Date   VD25OH 41.25 01/14/2024   Stable, cont oral replacement

## 2024-01-21 ENCOUNTER — Ambulatory Visit (HOSPITAL_COMMUNITY)
Admission: RE | Admit: 2024-01-21 | Discharge: 2024-01-21 | Disposition: A | Discharge status: Home / Self Care | Source: Ambulatory Visit | Attending: Physician Assistant | Admitting: Physician Assistant

## 2024-01-21 DIAGNOSIS — R911 Solitary pulmonary nodule: Secondary | ICD-10-CM | POA: Insufficient documentation

## 2024-01-21 LAB — GLUCOSE, CAPILLARY: Glucose-Capillary: 98 mg/dL (ref 70–99)

## 2024-01-21 MED ORDER — FLUDEOXYGLUCOSE F - 18 (FDG) INJECTION
5.4700 | Freq: Once | INTRAVENOUS | Status: AC
Start: 2024-01-21 — End: 2024-01-21
  Administered 2024-01-21: 5.47 via INTRAVENOUS

## 2024-01-28 ENCOUNTER — Telehealth: Payer: Self-pay

## 2024-01-28 NOTE — Progress Notes (Signed)
   Telephone encounter was:  Unsuccessful.  01/28/2024 Name: Roberto BRODERSEN [name] MRN: [id number] DOB: 06-17-1941  Unsuccessful outbound call made today to assist with:  Food Insecurity  Outreach Attempt:  2nd Attempt  Unable to leave a message    [name] [facility]  [facility] Guide, Phone: [phone] Fax: [phone] Website: [email or web address]

## 2024-02-02 ENCOUNTER — Telehealth: Payer: Self-pay

## 2024-02-02 NOTE — Progress Notes (Signed)
   Telephone encounter was:  Unsuccessful.  02/02/2024 Name: Roberto CLAYTOR [name] MRN: [id number] DOB: 1940-12-19  Unsuccessful outbound call made today to assist with:  Food Insecurity  Outreach Attempt:  3rd Attempt.  Referral closed unable to contact patient.  No answer and unable to leave a message    [name] [facility] Guide, Phone: [phone] Fax: [phone] Website: [email or web address]

## 2024-02-04 NOTE — Progress Notes (Signed)
 [facility] Navigator reached out to me to assist with scheduling pt for a consult at [facility]. I reached out to Cassell Cliche via [place] for assistance with getting pt scheduled. Per Nellie Banas, the pt was contacted on 5/20, but couldn't be reached and there wasn't a VM box set up. Per Cain Castillo, pt has a new phone number. I confirmed new number has been updated in pts demographics. Elizabeth notified of updated phone number if LBP needs to reach him. Nellie Banas offers 6/16 at 9:30 with Dara Ear, Chuckie Craven NP. Cain Castillo states that that appointment will work for the pt and that she will notify him.

## 2024-02-05 ENCOUNTER — Telehealth: Payer: Self-pay

## 2024-02-05 NOTE — Telephone Encounter (Signed)
 Copied from CRM [id number]. Topic: Appointments - Appointment Scheduling >> Feb 04, 2024 10:26 AM Corean Deutscher wrote: [facility] Castillo [facility] is calling to schedule an appointment. Patient referred to NP Dara Ear. Unable to schedule appointment, alerted to send CRM to clinical Pool. Referral in system for patient, Cain Castillo would like a call back regarding scheduling appointment as patient is unable to navigate for himself. Cain Castillo can be contacted at [phone]

## 2024-02-08 ENCOUNTER — Encounter: Payer: Self-pay | Admitting: Emergency Medicine

## 2024-02-08 ENCOUNTER — Encounter: Payer: Self-pay | Admitting: Acute Care

## 2024-02-08 ENCOUNTER — Ambulatory Visit (INDEPENDENT_AMBULATORY_CARE_PROVIDER_SITE_OTHER): Admitting: Acute Care

## 2024-02-08 VITALS — BP 117/76 | HR 83 | Ht 69.0 in | Wt 112.0 lb

## 2024-02-08 DIAGNOSIS — N2889 Other specified disorders of kidney and ureter: Secondary | ICD-10-CM

## 2024-02-08 DIAGNOSIS — R911 Solitary pulmonary nodule: Secondary | ICD-10-CM | POA: Insufficient documentation

## 2024-02-08 DIAGNOSIS — F17210 Nicotine dependence, cigarettes, uncomplicated: Secondary | ICD-10-CM

## 2024-02-08 DIAGNOSIS — F17200 Nicotine dependence, unspecified, uncomplicated: Secondary | ICD-10-CM

## 2024-02-08 NOTE — Progress Notes (Signed)
 History of Present Illness Roberto Wallace. is a 83 y.o. male current every day smoker abnormal lung nodule on imaging. He is followed by the [place], who have referred him to be seen. He will be followed by Dr. Baldwin Levee.  Past Medical History:  Diagnosis Date   Aneurysm (HCC)    per pt,in head   Arthritis    Chicken pox    Depression    Hypercholesteremia    Hypertension    Migraine    PTSD (post-traumatic stress disorder) 06/24/2017   Stroke (HCC)    No residual effects   Syphilis    Tuberculosis    Vitamin D  deficiency       02/08/2024 Roberto Sanger [name] is an 83 year old male who presents for evaluation and possible biopsy of a lung nodule. He was referred by the [facility] for evaluation of a lung nodule.  A lung nodule was identified on a PET scan, with prior CT scans performed at both [facility] and the [place]. The PET scan indicated a nodule in the left lung, and there is also a nodule in the kidney. He is here for evaluation to determine if the lung nodule is cancerous. He is concerned about undergoing another CT scan soon. We discussed this and he realizes it is safe and necessary to manage the lung and kidney findings.   He has experienced significant weight loss since 2003, despite having a good appetite and eating well. His weight has fluctuated from 115 pounds in October to 119 pounds, then down to 112 pounds, and currently at 110 pounds according to his home scale.  He is currently on blood pressure medication and has been off aspirin , which he was taking possibly due to a history of blood clots in his legs and neck. He also has a history of high cholesterol and colon surgery related to a colonoscopy.  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 He has a history of smoking since the age of 64 and is currently trying to quit, using an artificial device as a pacifier. He smokes between one  and two cigarettes a day, primarily to manage stress and loneliness.  No current breathing issues, sleep apnea, or diabetes. He has a history of a mold infection in his home, which took two and a half weeks to clear up.   There is a family history of prostate cancer in his brother. His mother dies of dementia.He states his father is still alive at 37 years old.  We have discussed his CY and his PET scan.  He understands that we are concerned that the area in the left lung as well as his kidney could potentially be a cancer.  We discussed that the only way to determine that for sure is to do a biopsy.  [facility] to determine what this is.  We have discussed the risks and benefits of bronchoscopy with biopsies under general anesthesia.  We did a thorough informed consenting process and he has agreed to move forward.  He is in a electronic wheelchair, he does not wear supplemental oxygen, he does not get short of breath conversationally.  Test Results: PET Scan 01/21/2024 No specific abnormal uptake seen in the neck including along lymph node change of the submandibular, posterior triangle or internal jugular region. Near symmetric uptake of  the visualized intracranial compartment.   Incidental CT findings: The parotid glands, submandibular glands unremarkable. Small thyroid  gland. Visualized portions of the paranasal sinuses and mastoid air cells are clear. Scattered vascular calcifications.   CHEST: There is a focal area of abnormal uptake in the left lung with maximum SUV value of 6.9. This corresponds to a juxtapleural nodule in the lateral aspect of the left upper lobe on series 4, image 61 measuring 17 by 10 mm. This is new from study of 2024. Only minimal nodularity on that examination in this location. No additional areas of abnormal lung uptake.   Small focus of asymmetric uptake along the left hilum with maximum SUV value of 3.7,  nonspecific. Minimal uptake as well elsewhere in the left hilum.   Incidental CT findings: Breathing motion. There is some linear opacity along the lung bases likely scar or atelectasis. Emphysematous scattered lung changes identified particular in the upper lung zones. Apical pleural thickening. There are some other areas of nodularity noted such as medially left upper lobe on series 4, image 49 measuring 6 mm but is unchanged from previous and does not show abnormal uptake. Areas in the posterior right upper lobe also seen are stable on image 57. No pleural effusion or pneumothorax. Heart is nonenlarged. No pericardial effusion. The thoracic aorta is normal course and caliber. Scattered calcified plaque. Coronary artery calcifications are seen.  MRI Brain 01/03/2024 No acute intracranial abnormality. 2. Mild chronic small vessel ischemic disease, mildly progressed from 2019. 3. Mild generalized cerebral atrophy.       Latest Ref Rng & Units 01/14/2024   11:04 AM 12/09/2022    8:55 AM 08/14/2021    1:20 PM  CBC  WBC 4.0 - 10.5 K/uL 5.9  6.4  6.5   Hemoglobin 13.0 - 17.0 g/dL 16.1  09.6  04.5   Hematocrit 39.0 - 52.0 % 38.4  42.5  40.5   Platelets 150.0 - 400.0 K/uL 214.0  222.0  231        Latest Ref Rng & Units 01/14/2024   11:04 AM 11/25/2022   10:55 AM 11/11/2022   10:24 AM  BMP  Glucose 70 - 99 mg/dL 409  90  86   BUN 6 - 23 mg/dL 14  10  6    Creatinine 0.40 - 1.50 mg/dL 8.11  9.14  7.82   BUN/Creat Ratio 10 - 24  11  6    Sodium 135 - 145 mEq/L 139  143  143   Potassium 3.5 - 5.1 mEq/L 4.3  4.3  4.4   Chloride 96 - 112 mEq/L 102  102  102   CO2 19 - 32 mEq/L 32  24  24   Calcium  8.4 - 10.5 mg/dL 9.1  9.6  9.3     BNP No results found for: BNP  ProBNP No results found for: PROBNP  PFT    Component Value Date/Time   FEV1PRE 1.94 11/28/2021 1131   FEV1POST 2.23 11/28/2021 1131   FVCPRE 2.96 11/28/2021 1131   FVCPOST 3.09 11/28/2021 1131   DLCOUNC 19.49  11/28/2021 1131   PREFEV1FVCRT 65 11/28/2021 1131   PSTFEV1FVCRT 72 11/28/2021 1131    NM PET Image Initial (PI) Skull Base To Thigh (F-18 FDG) Result Date: 01/22/2024 CLINICAL DATA:  Initial treatment strategy for lung nodule. EXAM: NUCLEAR MEDICINE PET SKULL BASE TO THIGH TECHNIQUE: 5.47 mCi F-18 FDG was injected intravenously. Full-ring PET imaging was performed from the skull base to thigh after the radiotracer. CT data  was obtained and used for attenuation correction and anatomic localization. Fasting blood glucose: 98 mg/dl COMPARISON:  CT scan chest 09/25/2022 and older. Images only. No report FINDINGS: Mediastinal blood pool activity: SUV max 2.4 Liver activity: SUV max 2.5 NECK: No specific abnormal uptake seen in the neck including along lymph node change of the submandibular, posterior triangle or internal jugular region. Near symmetric uptake of the visualized intracranial compartment. Incidental CT findings: The parotid glands, submandibular glands unremarkable. Small thyroid  gland. Visualized portions of the paranasal sinuses and mastoid air cells are clear. Scattered vascular calcifications. CHEST: There is a focal area of abnormal uptake in the left lung with maximum SUV value of 6.9. This corresponds to a juxtapleural nodule in the lateral aspect of the left upper lobe on series 4, image 61 measuring 17 by 10 mm. This is new from study of 2024. Only minimal nodularity on that examination in this location. No additional areas of abnormal lung uptake. Small focus of asymmetric uptake along the left hilum with maximum SUV value of 3.7, nonspecific. Minimal uptake as well elsewhere in the left hilum. Incidental CT findings: Breathing motion. There is some linear opacity along the lung bases likely scar or atelectasis. Emphysematous scattered lung changes identified particular in the upper lung zones. Apical pleural thickening. There are some other areas of nodularity noted such as medially left  upper lobe on series 4, image 49 measuring 6 mm but is unchanged from previous and does not show abnormal uptake. Areas in the posterior right upper lobe also seen are stable on image 57. No pleural effusion or pneumothorax. Heart is nonenlarged. No pericardial effusion. The thoracic aorta is normal course and caliber. Scattered calcified plaque. Coronary artery calcifications are seen. ABDOMEN/PELVIS: There is physiologic distribution radiotracer along the parenchymal organs, bowel and renal collecting systems. Exception is an area of uptake along the extreme inferior aspect of the left kidney which is asymmetric. Underlying mass lesion is possible. This area has a maximum SUV 13.7. Recommend dedicated contrast CT scan of the abdomen pelvis. Incidental CT findings: 4.1 by 3.9 cm in for abdominal aortic aneurysm identified. Stones in the gallbladder. Mild atrophy of the right kidney. Bilateral renal cysts are identified. These have Hounsfield units of less than 10. Enlarged prostate with mass effect along the base of the bladder. Please correlate for BPH in the patient's PSA. No abnormal uptake. Large bowel is normal course and caliber. Scattered colonic stool. Normal appendix is not clearly seen. Stomach and small bowel are nondilated. Left adrenal nodule identified measuring up to 19 mm. Not clearly an adenoma on this examination. However this lesion was seen going back to study of 2017 demonstrating long-term stability. Also no abnormal uptake today. SKELETON: No abnormal uptake along the visualized osseous structures. Incidental CT findings: Diffuse scattered degenerative changes. Old left-sided rib fractures. IMPRESSION: Hypermetabolic 17 mm peripheral left upper lobe lung nodule identified. Not seen on the study of 2024. This worrisome for potential neoplasm the. Hypermetabolic area along the inferior margin of the left kidney somewhat exophytic. Underlying lesion is possible and could be an aggressive  process. Would recommend additional workup with a contrast CT scan of the abdomen and pelvis when appropriate to exclude mass lesion. 4.1 cm in for abdominal aortic aneurysm. Recommend follow-up CT or MR as appropriate in 12 months and referral to or continued care with vascular specialist. (Ref.: J Vasc Surg. 2018; 67:2-77 and J Am Coll Radiol 2013;10(10):789-794.) Enlarged prostate without uptake. Please correlate with the patient's  PSA. Gallstones. Electronically Signed   By: Adrianna Horde M.D.   On: 01/22/2024 14:31     Past medical hx Past Medical History:  Diagnosis Date   Aneurysm (HCC)    per pt,in head   Arthritis    Chicken pox    Depression    Hypercholesteremia    Hypertension    Migraine    PTSD (post-traumatic stress disorder) 06/24/2017   Stroke (HCC)    No residual effects   Syphilis    Tuberculosis    Vitamin D  deficiency      Social History   Tobacco Use   Smoking status: Former    Current packs/day: 0.00    Average packs/day: 0.5 packs/day for 50.0 years (25.0 ttl pk-yrs)    Types: Cigarettes    Start date: 10/30/1971    Quit date: 10/29/2021    Years since quitting: 2.2   Smokeless tobacco: Never  Vaping Use   Vaping status: Never Used  Substance Use Topics   Alcohol use: Not Currently    Alcohol/week: 2.0 - 3.0 standard drinks of alcohol    Types: 2 - 3 Shots of liquor per week   Drug use: No    Roberto Wallace reports that he quit smoking about 2 years ago. His smoking use included cigarettes. He started smoking about 52 years ago. He has a 25 pack-year smoking history. He has never used smokeless tobacco. He reports that he does not currently use alcohol after a past usage of about 2.0 - 3.0 standard drinks of alcohol per week. He reports that he does not use drugs.  Tobacco Cessation: Counseling given: Not Answered Currently smokes 1 cigarette/day He has been counseled to quit completely and provided resources see AVS  Past surgical hx, Family hx,  Social hx all reviewed.  Current Outpatient Medications on File Prior to Visit  Medication Sig   Apoaequorin (PREVAGEN) 10 MG CAPS Take 1 tablet by mouth daily.   aspirin  EC 81 MG tablet Take 1 tablet (81 mg total) by mouth daily. Swallow whole.   atorvastatin  (LIPITOR ) 80 MG tablet Take 1 tablet (80 mg total) by mouth daily.   Cholecalciferol (THERA-D 2000) 50 MCG (2000 UT) TABS 1 tab by mouth once daily   ezetimibe  (ZETIA ) 10 MG tablet Take 1 tablet (10 mg total) by mouth daily.   fexofenadine  (ALLEGRA ) 180 MG tablet 180 mg.   losartan  (COZAAR ) 100 MG tablet Take 1 tablet (100 mg total) by mouth daily.   memantine  (NAMENDA ) 5 MG tablet Take 1 tablet (5 mg at night) for 2 weeks, then increase to 1 tablet (5 mg) twice a day   metoprolol  succinate (TOPROL  XL) 25 MG 24 hr tablet Take 0.5 tablets (12.5 mg total) by mouth at bedtime.   Multiple Vitamins-Minerals (VITA-MIN PO) Take 1 tablet by mouth daily at 6 (six) AM. Beef Root Supplement,Pt takes 1000 mg 1 tablet daily.   Multiple Vitamins-Minerals (VITA-MIN PO) Take 1 tablet by mouth daily at 6 (six) AM. Garlic Supplement ,Pt takes 1000 mg 1 tablet daily.   sertraline  (ZOLOFT ) 25 MG tablet Take 1 tablet (25 mg total) by mouth daily.   tamsulosin (FLOMAX) 0.4 MG CAPS capsule Take 0.4 mg by mouth at bedtime.   Tiotropium Bromide Monohydrate 1.25 MCG/ACT AERS Take by mouth.   triamcinolone cream (KENALOG) 0.1 % Apply 1 Application topically 2 (two) times daily as needed (dry skin).   vitamin B-12 (CYANOCOBALAMIN ) 100 MCG tablet Take 1 tablet (100 mcg total) by mouth  daily.   No current facility-administered medications on file prior to visit.     Allergies  Allergen Reactions   Pollen Extract     Other reaction(s): Sinusitis   Amlodipine Rash   Brinzolamide-Brimonidine Other (See Comments)    Review Of Systems:  Constitutional:   +  weight loss, no night sweats,  Fevers, chills, fatigue, or  lassitude.  HEENT:   No headaches,   Difficulty swallowing,  Tooth/dental problems, or  Sore throat,                No sneezing, itching, ear ache, nasal congestion, post nasal drip,   CV:  No chest pain,  Orthopnea, PND, swelling in lower extremities, anasarca, dizziness, palpitations, syncope.   GI  No heartburn, indigestion, abdominal pain, nausea, vomiting, diarrhea, change in bowel habits, loss of appetite, bloody stools.   Resp: + shortness of breath with exertion none at rest.  No excess mucus, no productive cough,  No non-productive cough,  No coughing up of blood.  No change in color of mucus.  No wheezing.  No chest wall deformity  Skin: no rash or lesions.  GU: no dysuria, change in color of urine, no urgency or frequency.  No flank pain, no hematuria   MS:  + joint pain or swelling.  + decreased range of motion.  No back pain.  Psych:  No change in mood or affect. No depression or anxiety.  No memory loss.   Vital Signs BP 117/76 (BP Location: Left Arm, Patient Position: Sitting, Cuff Size: Normal)   Pulse 83   Ht 5' 9 (1.753 m)   Wt 112 lb (50.8 kg)   SpO2 100%   BMI 16.54 kg/m    Physical Exam:  General- No distress,  A&Ox3, pleasant ENT: No sinus tenderness, TM clear, pale nasal mucosa, no oral exudate,no post nasal drip, no LAN Cardiac: S1, S2, regular rate and rhythm, no murmur Chest: No wheeze/ rales/ dullness; no accessory muscle use, no nasal flaring, no sternal retractions, slightly diminished per bases Abd.: Soft Non-tender, nondistended, bowel sounds positive,Body mass index is 16.54 kg/m.  Ext: No clubbing cyanosis, edema, no obvious deformities Neuro: Physically deconditioned, moving all extremities x 4, alert and oriented x 3, states he does have episodes of forgetfulness. Skin: No rashes, warm and dry, no obvious skin lesions Psych: normal mood and behavior   Assessment/Plan Lung nodule Lung nodule in left lung with PET activity suggests malignancy. Differential includes primary  lung cancer or metastasis from kidney nodule. - Schedule navigational bronchoscopy for biopsy on June 24th. - Order CT scan for bronchoscopy navigation. - Discussed procedure risks: bleeding, infection, pneumothorax, potential chest tube placement. - I have placed an order for a bronchoscopy with biopsies.  We have discussed the procedure in detail.  We have reviewed the risks and benefits of the procedure. These include bleeding, infection, puncture of the lung, and adverse reaction to anesthesia. You have agreed to proceed with biopsy to evaluate the lung nodule of concern. Your procedure will be done by Dr. Racheal Buddle. You will also need to have a CT Chest done before to help with navigation to the nodule. You will receive a letter today with date time and information pertaining to the procedure. You will need someone to drive you to the procedure, stay with you during the procedure, and stay with you after the procedure. You will also need someone to stay with you for 24 hours after anesthesia to ensure you have  cleared and are doing well. You will follow-up with me 1 week after the procedure to review the results and to ensure you are doing well. Call if you need us  prior to the procedure or if you have any questions at all. Please contact office for sooner follow up if symptoms do not improve or worsen or seek emergency care      Kidney nodule Plan  - If lung biopsy is + for renal cancer and therefore metastatic, we will know the kidney lesion is a cancer. - Unintentional weight loss, but good appetite - Consider adding Boost/ Ensure  Smoking Long-term smoker, currently 1-2 cigarettes/day, attempting cessation with artificial cigarette device. Plan You can receive free nicotine  replacement therapy (patches, gum, or mints) by calling 1-800-QUIT NOW. Please call so we can get you on the path to becoming a non-smoker. I know it is hard, but you can do this! Hypnosis for smoking  cessation  [employer] [phone] Acupuncture for smoking cessation  [employer] [phone]    Hypertension Hypertension controlled with medication. - Continue current regimen  Colon polyps History of colon polyps, no current symptoms or recent evaluations. Plan - Had appropriate follow-up with GI for colonoscopies  I spent 45 minutes dedicated to the care of this patient on the date of this encounter to include pre-visit review of records, face-to-face time with the patient discussing conditions above, post visit ordering of testing, clinical documentation with the electronic health record, making appropriate referrals as documented, and communicating necessary information to the patient's healthcare team.    Raejean Bullock, NP 02/08/2024  9:33 AM

## 2024-02-08 NOTE — Patient Instructions (Addendum)
 I have placed an order for a bronchoscopy with biopsies.  We have discussed the procedure in detail.  We have reviewed the risks and benefits of the procedure. These include bleeding, infection, puncture of the lung, and adverse reaction to anesthesia. You have agreed to proceed with biopsy to evaluate the lung nodule of concern. Your procedure will be done by Dr. Racheal Buddle. You will also need to have a CT Chest done before to help with navigation to the nodule. You will receive a letter today with date time and information pertaining to the procedure. You will need someone to drive you to the procedure, stay with you during the procedure, and stay with you after the procedure. You will also need someone to stay with you for 24 hours after anesthesia to ensure you have cleared and are doing well. You will follow-up with me 1 week after the procedure to review the results and to ensure you are doing well. Call if you need us  prior to the procedure or if you have any questions at all. Please contact office for sooner follow up if symptoms do not improve or worsen or seek emergency care Please work on quitting smoking You can receive free nicotine  replacement therapy ( patches, gum or mints) by calling 1-800-QUIT NOW. Please call so we can get you on the path to becoming  a non-smoker. I know it is hard, but you can do this!  Other options for assistance in smoking cessation ( As covered by your insurance benefits)  Hypnosis for smoking cessation  [employer] [phone]  Acupuncture for smoking cessation  [employer] [phone]   .

## 2024-02-08 NOTE — H&P (View-Only) (Signed)
 History of Present Illness Roberto Wallace. is a 83 y.o. male current every day smoker abnormal lung nodule on imaging. He is followed by the [place], who have referred him to be seen. He will be followed by Dr. Baldwin Levee.  Past Medical History:  Diagnosis Date   Aneurysm (HCC)    per pt,in head   Arthritis    Chicken pox    Depression    Hypercholesteremia    Hypertension    Migraine    PTSD (post-traumatic stress disorder) 06/24/2017   Stroke (HCC)    No residual effects   Syphilis    Tuberculosis    Vitamin D  deficiency       02/08/2024 Roberto Sanger [name] is an 83 year old male who presents for evaluation and possible biopsy of a lung nodule. He was referred by the [facility] for evaluation of a lung nodule.  A lung nodule was identified on a PET scan, with prior CT scans performed at both [facility] and the [place]. The PET scan indicated a nodule in the left lung, and there is also a nodule in the kidney. He is here for evaluation to determine if the lung nodule is cancerous. He is concerned about undergoing another CT scan soon. We discussed this and he realizes it is safe and necessary to manage the lung and kidney findings.   He has experienced significant weight loss since 2003, despite having a good appetite and eating well. His weight has fluctuated from 115 pounds in October to 119 pounds, then down to 112 pounds, and currently at 110 pounds according to his home scale.  He is currently on blood pressure medication and has been off aspirin , which he was taking possibly due to a history of blood clots in his legs and neck. He also has a history of high cholesterol and colon surgery related to a colonoscopy.  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 He has a history of smoking since the age of 64 and is currently trying to quit, using an artificial device as a pacifier. He smokes between one  and two cigarettes a day, primarily to manage stress and loneliness.  No current breathing issues, sleep apnea, or diabetes. He has a history of a mold infection in his home, which took two and a half weeks to clear up.   There is a family history of prostate cancer in his brother. His mother dies of dementia.He states his father is still alive at 37 years old.  We have discussed his CY and his PET scan.  He understands that we are concerned that the area in the left lung as well as his kidney could potentially be a cancer.  We discussed that the only way to determine that for sure is to do a biopsy.  [facility] to determine what this is.  We have discussed the risks and benefits of bronchoscopy with biopsies under general anesthesia.  We did a thorough informed consenting process and he has agreed to move forward.  He is in a electronic wheelchair, he does not wear supplemental oxygen, he does not get short of breath conversationally.  Test Results: PET Scan 01/21/2024 No specific abnormal uptake seen in the neck including along lymph node change of the submandibular, posterior triangle or internal jugular region. Near symmetric uptake of  the visualized intracranial compartment.   Incidental CT findings: The parotid glands, submandibular glands unremarkable. Small thyroid  gland. Visualized portions of the paranasal sinuses and mastoid air cells are clear. Scattered vascular calcifications.   CHEST: There is a focal area of abnormal uptake in the left lung with maximum SUV value of 6.9. This corresponds to a juxtapleural nodule in the lateral aspect of the left upper lobe on series 4, image 61 measuring 17 by 10 mm. This is new from study of 2024. Only minimal nodularity on that examination in this location. No additional areas of abnormal lung uptake.   Small focus of asymmetric uptake along the left hilum with maximum SUV value of 3.7,  nonspecific. Minimal uptake as well elsewhere in the left hilum.   Incidental CT findings: Breathing motion. There is some linear opacity along the lung bases likely scar or atelectasis. Emphysematous scattered lung changes identified particular in the upper lung zones. Apical pleural thickening. There are some other areas of nodularity noted such as medially left upper lobe on series 4, image 49 measuring 6 mm but is unchanged from previous and does not show abnormal uptake. Areas in the posterior right upper lobe also seen are stable on image 57. No pleural effusion or pneumothorax. Heart is nonenlarged. No pericardial effusion. The thoracic aorta is normal course and caliber. Scattered calcified plaque. Coronary artery calcifications are seen.  MRI Brain 01/03/2024 No acute intracranial abnormality. 2. Mild chronic small vessel ischemic disease, mildly progressed from 2019. 3. Mild generalized cerebral atrophy.       Latest Ref Rng & Units 01/14/2024   11:04 AM 12/09/2022    8:55 AM 08/14/2021    1:20 PM  CBC  WBC 4.0 - 10.5 K/uL 5.9  6.4  6.5   Hemoglobin 13.0 - 17.0 g/dL 16.1  09.6  04.5   Hematocrit 39.0 - 52.0 % 38.4  42.5  40.5   Platelets 150.0 - 400.0 K/uL 214.0  222.0  231        Latest Ref Rng & Units 01/14/2024   11:04 AM 11/25/2022   10:55 AM 11/11/2022   10:24 AM  BMP  Glucose 70 - 99 mg/dL 409  90  86   BUN 6 - 23 mg/dL 14  10  6    Creatinine 0.40 - 1.50 mg/dL 8.11  9.14  7.82   BUN/Creat Ratio 10 - 24  11  6    Sodium 135 - 145 mEq/L 139  143  143   Potassium 3.5 - 5.1 mEq/L 4.3  4.3  4.4   Chloride 96 - 112 mEq/L 102  102  102   CO2 19 - 32 mEq/L 32  24  24   Calcium  8.4 - 10.5 mg/dL 9.1  9.6  9.3     BNP No results found for: BNP  ProBNP No results found for: PROBNP  PFT    Component Value Date/Time   FEV1PRE 1.94 11/28/2021 1131   FEV1POST 2.23 11/28/2021 1131   FVCPRE 2.96 11/28/2021 1131   FVCPOST 3.09 11/28/2021 1131   DLCOUNC 19.49  11/28/2021 1131   PREFEV1FVCRT 65 11/28/2021 1131   PSTFEV1FVCRT 72 11/28/2021 1131    NM PET Image Initial (PI) Skull Base To Thigh (F-18 FDG) Result Date: 01/22/2024 CLINICAL DATA:  Initial treatment strategy for lung nodule. EXAM: NUCLEAR MEDICINE PET SKULL BASE TO THIGH TECHNIQUE: 5.47 mCi F-18 FDG was injected intravenously. Full-ring PET imaging was performed from the skull base to thigh after the radiotracer. CT data  was obtained and used for attenuation correction and anatomic localization. Fasting blood glucose: 98 mg/dl COMPARISON:  CT scan chest 09/25/2022 and older. Images only. No report FINDINGS: Mediastinal blood pool activity: SUV max 2.4 Liver activity: SUV max 2.5 NECK: No specific abnormal uptake seen in the neck including along lymph node change of the submandibular, posterior triangle or internal jugular region. Near symmetric uptake of the visualized intracranial compartment. Incidental CT findings: The parotid glands, submandibular glands unremarkable. Small thyroid  gland. Visualized portions of the paranasal sinuses and mastoid air cells are clear. Scattered vascular calcifications. CHEST: There is a focal area of abnormal uptake in the left lung with maximum SUV value of 6.9. This corresponds to a juxtapleural nodule in the lateral aspect of the left upper lobe on series 4, image 61 measuring 17 by 10 mm. This is new from study of 2024. Only minimal nodularity on that examination in this location. No additional areas of abnormal lung uptake. Small focus of asymmetric uptake along the left hilum with maximum SUV value of 3.7, nonspecific. Minimal uptake as well elsewhere in the left hilum. Incidental CT findings: Breathing motion. There is some linear opacity along the lung bases likely scar or atelectasis. Emphysematous scattered lung changes identified particular in the upper lung zones. Apical pleural thickening. There are some other areas of nodularity noted such as medially left  upper lobe on series 4, image 49 measuring 6 mm but is unchanged from previous and does not show abnormal uptake. Areas in the posterior right upper lobe also seen are stable on image 57. No pleural effusion or pneumothorax. Heart is nonenlarged. No pericardial effusion. The thoracic aorta is normal course and caliber. Scattered calcified plaque. Coronary artery calcifications are seen. ABDOMEN/PELVIS: There is physiologic distribution radiotracer along the parenchymal organs, bowel and renal collecting systems. Exception is an area of uptake along the extreme inferior aspect of the left kidney which is asymmetric. Underlying mass lesion is possible. This area has a maximum SUV 13.7. Recommend dedicated contrast CT scan of the abdomen pelvis. Incidental CT findings: 4.1 by 3.9 cm in for abdominal aortic aneurysm identified. Stones in the gallbladder. Mild atrophy of the right kidney. Bilateral renal cysts are identified. These have Hounsfield units of less than 10. Enlarged prostate with mass effect along the base of the bladder. Please correlate for BPH in the patient's PSA. No abnormal uptake. Large bowel is normal course and caliber. Scattered colonic stool. Normal appendix is not clearly seen. Stomach and small bowel are nondilated. Left adrenal nodule identified measuring up to 19 mm. Not clearly an adenoma on this examination. However this lesion was seen going back to study of 2017 demonstrating long-term stability. Also no abnormal uptake today. SKELETON: No abnormal uptake along the visualized osseous structures. Incidental CT findings: Diffuse scattered degenerative changes. Old left-sided rib fractures. IMPRESSION: Hypermetabolic 17 mm peripheral left upper lobe lung nodule identified. Not seen on the study of 2024. This worrisome for potential neoplasm the. Hypermetabolic area along the inferior margin of the left kidney somewhat exophytic. Underlying lesion is possible and could be an aggressive  process. Would recommend additional workup with a contrast CT scan of the abdomen and pelvis when appropriate to exclude mass lesion. 4.1 cm in for abdominal aortic aneurysm. Recommend follow-up CT or MR as appropriate in 12 months and referral to or continued care with vascular specialist. (Ref.: J Vasc Surg. 2018; 67:2-77 and J Am Coll Radiol 2013;10(10):789-794.) Enlarged prostate without uptake. Please correlate with the patient's  PSA. Gallstones. Electronically Signed   By: Adrianna Horde M.D.   On: 01/22/2024 14:31     Past medical hx Past Medical History:  Diagnosis Date   Aneurysm (HCC)    per pt,in head   Arthritis    Chicken pox    Depression    Hypercholesteremia    Hypertension    Migraine    PTSD (post-traumatic stress disorder) 06/24/2017   Stroke (HCC)    No residual effects   Syphilis    Tuberculosis    Vitamin D  deficiency      Social History   Tobacco Use   Smoking status: Former    Current packs/day: 0.00    Average packs/day: 0.5 packs/day for 50.0 years (25.0 ttl pk-yrs)    Types: Cigarettes    Start date: 10/30/1971    Quit date: 10/29/2021    Years since quitting: 2.2   Smokeless tobacco: Never  Vaping Use   Vaping status: Never Used  Substance Use Topics   Alcohol use: Not Currently    Alcohol/week: 2.0 - 3.0 standard drinks of alcohol    Types: 2 - 3 Shots of liquor per week   Drug use: No    Mr.Henson reports that he quit smoking about 2 years ago. His smoking use included cigarettes. He started smoking about 52 years ago. He has a 25 pack-year smoking history. He has never used smokeless tobacco. He reports that he does not currently use alcohol after a past usage of about 2.0 - 3.0 standard drinks of alcohol per week. He reports that he does not use drugs.  Tobacco Cessation: Counseling given: Not Answered Currently smokes 1 cigarette/day He has been counseled to quit completely and provided resources see AVS  Past surgical hx, Family hx,  Social hx all reviewed.  Current Outpatient Medications on File Prior to Visit  Medication Sig   Apoaequorin (PREVAGEN) 10 MG CAPS Take 1 tablet by mouth daily.   aspirin  EC 81 MG tablet Take 1 tablet (81 mg total) by mouth daily. Swallow whole.   atorvastatin  (LIPITOR ) 80 MG tablet Take 1 tablet (80 mg total) by mouth daily.   Cholecalciferol (THERA-D 2000) 50 MCG (2000 UT) TABS 1 tab by mouth once daily   ezetimibe  (ZETIA ) 10 MG tablet Take 1 tablet (10 mg total) by mouth daily.   fexofenadine  (ALLEGRA ) 180 MG tablet 180 mg.   losartan  (COZAAR ) 100 MG tablet Take 1 tablet (100 mg total) by mouth daily.   memantine  (NAMENDA ) 5 MG tablet Take 1 tablet (5 mg at night) for 2 weeks, then increase to 1 tablet (5 mg) twice a day   metoprolol  succinate (TOPROL  XL) 25 MG 24 hr tablet Take 0.5 tablets (12.5 mg total) by mouth at bedtime.   Multiple Vitamins-Minerals (VITA-MIN PO) Take 1 tablet by mouth daily at 6 (six) AM. Beef Root Supplement,Pt takes 1000 mg 1 tablet daily.   Multiple Vitamins-Minerals (VITA-MIN PO) Take 1 tablet by mouth daily at 6 (six) AM. Garlic Supplement ,Pt takes 1000 mg 1 tablet daily.   sertraline  (ZOLOFT ) 25 MG tablet Take 1 tablet (25 mg total) by mouth daily.   tamsulosin (FLOMAX) 0.4 MG CAPS capsule Take 0.4 mg by mouth at bedtime.   Tiotropium Bromide Monohydrate 1.25 MCG/ACT AERS Take by mouth.   triamcinolone cream (KENALOG) 0.1 % Apply 1 Application topically 2 (two) times daily as needed (dry skin).   vitamin B-12 (CYANOCOBALAMIN ) 100 MCG tablet Take 1 tablet (100 mcg total) by mouth  daily.   No current facility-administered medications on file prior to visit.     Allergies  Allergen Reactions   Pollen Extract     Other reaction(s): Sinusitis   Amlodipine Rash   Brinzolamide-Brimonidine Other (See Comments)    Review Of Systems:  Constitutional:   +  weight loss, no night sweats,  Fevers, chills, fatigue, or  lassitude.  HEENT:   No headaches,   Difficulty swallowing,  Tooth/dental problems, or  Sore throat,                No sneezing, itching, ear ache, nasal congestion, post nasal drip,   CV:  No chest pain,  Orthopnea, PND, swelling in lower extremities, anasarca, dizziness, palpitations, syncope.   GI  No heartburn, indigestion, abdominal pain, nausea, vomiting, diarrhea, change in bowel habits, loss of appetite, bloody stools.   Resp: + shortness of breath with exertion none at rest.  No excess mucus, no productive cough,  No non-productive cough,  No coughing up of blood.  No change in color of mucus.  No wheezing.  No chest wall deformity  Skin: no rash or lesions.  GU: no dysuria, change in color of urine, no urgency or frequency.  No flank pain, no hematuria   MS:  + joint pain or swelling.  + decreased range of motion.  No back pain.  Psych:  No change in mood or affect. No depression or anxiety.  No memory loss.   Vital Signs BP 117/76 (BP Location: Left Arm, Patient Position: Sitting, Cuff Size: Normal)   Pulse 83   Ht 5' 9 (1.753 m)   Wt 112 lb (50.8 kg)   SpO2 100%   BMI 16.54 kg/m    Physical Exam:  General- No distress,  A&Ox3, pleasant ENT: No sinus tenderness, TM clear, pale nasal mucosa, no oral exudate,no post nasal drip, no LAN Cardiac: S1, S2, regular rate and rhythm, no murmur Chest: No wheeze/ rales/ dullness; no accessory muscle use, no nasal flaring, no sternal retractions, slightly diminished per bases Abd.: Soft Non-tender, nondistended, bowel sounds positive,Body mass index is 16.54 kg/m.  Ext: No clubbing cyanosis, edema, no obvious deformities Neuro: Physically deconditioned, moving all extremities x 4, alert and oriented x 3, states he does have episodes of forgetfulness. Skin: No rashes, warm and dry, no obvious skin lesions Psych: normal mood and behavior   Assessment/Plan Lung nodule Lung nodule in left lung with PET activity suggests malignancy. Differential includes primary  lung cancer or metastasis from kidney nodule. - Schedule navigational bronchoscopy for biopsy on June 24th. - Order CT scan for bronchoscopy navigation. - Discussed procedure risks: bleeding, infection, pneumothorax, potential chest tube placement. - I have placed an order for a bronchoscopy with biopsies.  We have discussed the procedure in detail.  We have reviewed the risks and benefits of the procedure. These include bleeding, infection, puncture of the lung, and adverse reaction to anesthesia. You have agreed to proceed with biopsy to evaluate the lung nodule of concern. Your procedure will be done by Dr. Racheal Buddle. You will also need to have a CT Chest done before to help with navigation to the nodule. You will receive a letter today with date time and information pertaining to the procedure. You will need someone to drive you to the procedure, stay with you during the procedure, and stay with you after the procedure. You will also need someone to stay with you for 24 hours after anesthesia to ensure you have  cleared and are doing well. You will follow-up with me 1 week after the procedure to review the results and to ensure you are doing well. Call if you need us  prior to the procedure or if you have any questions at all. Please contact office for sooner follow up if symptoms do not improve or worsen or seek emergency care      Kidney nodule Plan  - If lung biopsy is + for renal cancer and therefore metastatic, we will know the kidney lesion is a cancer. - Unintentional weight loss, but good appetite - Consider adding Boost/ Ensure  Smoking Long-term smoker, currently 1-2 cigarettes/day, attempting cessation with artificial cigarette device. Plan You can receive free nicotine  replacement therapy (patches, gum, or mints) by calling 1-800-QUIT NOW. Please call so we can get you on the path to becoming a non-smoker. I know it is hard, but you can do this! Hypnosis for smoking  cessation  [employer] [phone] Acupuncture for smoking cessation  [employer] [phone]    Hypertension Hypertension controlled with medication. - Continue current regimen  Colon polyps History of colon polyps, no current symptoms or recent evaluations. Plan - Had appropriate follow-up with GI for colonoscopies  I spent 45 minutes dedicated to the care of this patient on the date of this encounter to include pre-visit review of records, face-to-face time with the patient discussing conditions above, post visit ordering of testing, clinical documentation with the electronic health record, making appropriate referrals as documented, and communicating necessary information to the patient's healthcare team.    Raejean Bullock, NP 02/08/2024  9:33 AM

## 2024-02-09 ENCOUNTER — Ambulatory Visit (HOSPITAL_BASED_OUTPATIENT_CLINIC_OR_DEPARTMENT_OTHER)
Admission: RE | Admit: 2024-02-09 | Discharge: 2024-02-09 | Disposition: A | Discharge status: Home / Self Care | Source: Ambulatory Visit | Attending: Acute Care | Admitting: Acute Care

## 2024-02-09 DIAGNOSIS — R911 Solitary pulmonary nodule: Secondary | ICD-10-CM | POA: Diagnosis present

## 2024-02-15 ENCOUNTER — Other Ambulatory Visit: Payer: Self-pay

## 2024-02-15 ENCOUNTER — Encounter (HOSPITAL_COMMUNITY): Payer: Self-pay | Admitting: Emergency Medicine

## 2024-02-15 NOTE — Progress Notes (Signed)
 Anesthesia Chart Review: Same day workup  83 year old male follows with cardiology for history of HTN, NSVT, prior CVA, PAD, mild nonobstructive CAD by CTA 09/2021.  He has a known large secundum ASD with bidirectional flow which was not amenable to closure by transcatheter techniques.  Last seen in cardiology follow-up by Dr. Darron on 12/29/2023.  Discussed that he has evidence of diffuse peripheral arterial disease, no lifestyle limiting claudication, no evidence of critical limb ischemia.  He uses a motorized wheelchair secondary to severe arthritis.  Current smoker recently referred to pulmonology by the [facility] for evaluation and biopsy of lung nodule.  PET scan 01/21/2024 showed hypermetabolic left upper lobe nodule as well as hypermetabolic area along the inferior margin of the left kidney.  He will need day of surgery labs and evaluation.  EKG 12/29/2023: NSR.  Rate 78.  Coronary CTA 10/22/2021: IMPRESSION: 1. Coronary calcium  score of 127. This was 52 percentile for age and sex matched control.   2. Normal coronary origin with right dominance.   3. CAD-RADS 2. Mild non-obstructive CAD (25-49%). Consider non-atherosclerotic causes of chest pain. Consider preventive therapy and risk factor modification.   4. Large Secundum ASD noted with dilatation of the right atrium and the right ventricle.  TTE 08/15/2021: 1. Large Secundum atrial septal defect (ASD) with bidirectional flow, 1.7  x 1.8 cm.   2. Left ventricular ejection fraction, by estimation, is 60 to 65%. The  left ventricle has normal function. The left ventricle has no regional  wall motion abnormalities.   3. Right ventricular systolic function is normal. The right ventricular  size is mildly enlarged. There is normal pulmonary artery systolic  pressure.   4. No left atrial/left atrial appendage thrombus was detected.   5. The mitral valve is normal in structure. Trivial mitral valve  regurgitation. No evidence of mitral  stenosis.   6. The aortic valve is tricuspid. Aortic valve regurgitation is trivial.  No aortic stenosis is present.   7. The inferior vena cava is normal in size with greater than 50%  respiratory variability, suggesting right atrial pressure of 3 mmHg.   8. Evidence of atrial level shunting detected by color flow Doppler.  Agitated saline contrast bubble study was positive with shunting observed  within 3-6 cardiac cycles suggestive of interatrial shunt. There is a  large secundum atrial septal defect with   bidirectional shunting across the atrial septum.      Lynwood Geofm RIGGERS [facility]/Anesthesiology Phone [phone] 02/15/2024 11:21 AM

## 2024-02-15 NOTE — Anesthesia Preprocedure Evaluation (Signed)
 Anesthesia Evaluation  Patient identified by MRN, date of birth, ID band Patient awake    Reviewed: Allergy & Precautions, NPO status , Patient's Chart, lab work & pertinent test results, reviewed documented beta blocker date and time   History of Anesthesia Complications Negative for: history of anesthetic complications  Airway Mallampati: II       Dental  (+) Edentulous Upper, Edentulous Lower   Pulmonary COPD, Patient abstained from smoking., former smoker   breath sounds clear to auscultation       Cardiovascular hypertension, + Peripheral Vascular Disease   Rhythm:Regular Rate:Normal  Giant asd   IMPRESSIONS     1. Large Secundum atrial septal defect (ASD) with bidirectional flow, 1.7  x 1.8 cm.   2. Left ventricular ejection fraction, by estimation, is 60 to 65%. The  left ventricle has normal function. The left ventricle has no regional  wall motion abnormalities.   3. Right ventricular systolic function is normal. The right ventricular  size is mildly enlarged. There is normal pulmonary artery systolic  pressure.   4. No left atrial/left atrial appendage thrombus was detected.   5. The mitral valve is normal in structure. Trivial mitral valve  regurgitation. No evidence of mitral stenosis.   6. The aortic valve is tricuspid. Aortic valve regurgitation is trivial.  No aortic stenosis is present.   7. The inferior vena cava is normal in size with greater than 50%  respiratory variability, suggesting right atrial pressure of 3 mmHg.   8. Evidence of atrial level shunting detected by color flow Doppler.  Agitated saline contrast bubble study was positive with shunting observed  within 3-6 cardiac cycles suggestive of interatrial shunt. There is a  large secundum atrial septal defect with   bidirectional shunting across the atrial septum.     Neuro/Psych  Headaches PSYCHIATRIC DISORDERS Anxiety Depression    Dementia CVA    GI/Hepatic   Endo/Other    Renal/GU Renal disease     Musculoskeletal   Abdominal   Peds  Hematology   Anesthesia Other Findings   Reproductive/Obstetrics                             Anesthesia Physical Anesthesia Plan  ASA: 3  Anesthesia Plan: General   Post-op Pain Management:    Induction: Intravenous  PONV Risk Score and Plan: 2 and Ondansetron and Dexamethasone  Airway Management Planned: Oral ETT  Additional Equipment:   Intra-op Plan:   Post-operative Plan: Extubation in OR  Informed Consent: I have reviewed the patients History and Physical, chart, labs and discussed the procedure including the risks, benefits and alternatives for the proposed anesthesia with the patient or authorized representative who has indicated his/her understanding and acceptance.     Dental advisory given  Plan Discussed with: CRNA  Anesthesia Plan Comments: (PAT note by Lynwood Hope, PA-C: 83 year old male follows with cardiology for history of HTN, NSVT, prior CVA, PAD, mild nonobstructive CAD by CTA 09/2021.  He has a known large secundum ASD with bidirectional flow which was not amenable to closure by transcatheter techniques.  Last seen in cardiology follow-up by Dr. Darron on 12/29/2023.  Discussed that he has evidence of diffuse peripheral arterial disease, no lifestyle limiting claudication, no evidence of critical limb ischemia.  He uses a motorized wheelchair secondary to severe arthritis.  Current smoker recently referred to pulmonology by the [facility] for evaluation and biopsy of lung nodule.  PET scan  01/21/2024 showed hypermetabolic left upper lobe nodule as well as hypermetabolic area along the inferior margin of the left kidney.  He will need day of surgery labs and evaluation.  EKG 12/29/2023: NSR.  Rate 78.  Coronary CTA 10/22/2021: IMPRESSION: 1. Coronary calcium  score of 127. This was 19 percentile for age and sex matched  control.  2. Normal coronary origin with right dominance.  3. CAD-RADS 2. Mild non-obstructive CAD (25-49%). Consider non-atherosclerotic causes of chest pain. Consider preventive therapy and risk factor modification.  4. Large Secundum ASD noted with dilatation of the right atrium and the right ventricle.  TTE 08/15/2021: 1. Large Secundum atrial septal defect (ASD) with bidirectional flow, 1.7  x 1.8 cm.  2. Left ventricular ejection fraction, by estimation, is 60 to 65%. The  left ventricle has normal function. The left ventricle has no regional  wall motion abnormalities.  3. Right ventricular systolic function is normal. The right ventricular  size is mildly enlarged. There is normal pulmonary artery systolic  pressure.  4. No left atrial/left atrial appendage thrombus was detected.  5. The mitral valve is normal in structure. Trivial mitral valve  regurgitation. No evidence of mitral stenosis.  6. The aortic valve is tricuspid. Aortic valve regurgitation is trivial.  No aortic stenosis is present.  7. The inferior vena cava is normal in size with greater than 50%  respiratory variability, suggesting right atrial pressure of 3 mmHg.  8. Evidence of atrial level shunting detected by color flow Doppler.  Agitated saline contrast bubble study was positive with shunting observed  within 3-6 cardiac cycles suggestive of interatrial shunt. There is a  large secundum atrial septal defect with  bidirectional shunting across the atrial septum.    )        Anesthesia Quick Evaluation

## 2024-02-15 NOTE — Progress Notes (Signed)
 SDW CALL  Patient was given pre-op instructions over the phone. The opportunity was given for the patient to ask questions. No further questions asked. Patient verbalized understanding of instructions given.   PCP - Norleen Lynwood ORN, MD  Cardiologist - Wendel Haws, MD (LOV 12-29-23)  PPM/ICD - denies Device Orders - n/a Rep Notified - n/a  Chest x-ray - Chest CT 02-09-24 (DOS in PACU on 02-16-24) EKG - 12-29-23 Stress Test -  ECHO - 08-15-21 Cardiac Cath -  PFT-11-28-21  Sleep Study -  CPAP -   DM denies Last A1c 5.7 on 01-14-24  Blood Thinner Instructions: Aspirin  Instructions: Do not take per patient, I am not to take any medicines tomorrow'  Medications that can be taken- patient to bring a list of medications with him. Did not want to review  ERAS Protcol - NPO   COVID TEST- n/a   Anesthesia review: yes Hx of HTN, Stroke  Patient denies shortness of breath, fever, cough and chest pain over the phone call   All instructions explained to the patient, with a verbal understanding of the material. Patient agrees to go over the instructions while at home for a better understanding.

## 2024-02-16 ENCOUNTER — Encounter (HOSPITAL_COMMUNITY): Payer: Self-pay | Admitting: Emergency Medicine

## 2024-02-16 ENCOUNTER — Other Ambulatory Visit: Payer: Self-pay

## 2024-02-16 ENCOUNTER — Ambulatory Visit (HOSPITAL_COMMUNITY): Admitting: Physician Assistant

## 2024-02-16 ENCOUNTER — Encounter (HOSPITAL_COMMUNITY)
Admission: RE | Disposition: A | Discharge status: Home / Self Care | Payer: Self-pay | Source: Home / Self Care | Attending: Emergency Medicine

## 2024-02-16 ENCOUNTER — Observation Stay (HOSPITAL_COMMUNITY)
Admission: RE | Admit: 2024-02-16 | Discharge: 2024-02-17 | Disposition: A | Discharge status: Home / Self Care | Attending: Emergency Medicine | Admitting: Emergency Medicine

## 2024-02-16 ENCOUNTER — Ambulatory Visit (HOSPITAL_COMMUNITY)

## 2024-02-16 DIAGNOSIS — F419 Anxiety disorder, unspecified: Secondary | ICD-10-CM | POA: Diagnosis not present

## 2024-02-16 DIAGNOSIS — I130 Hypertensive heart and chronic kidney disease with heart failure and stage 1 through stage 4 chronic kidney disease, or unspecified chronic kidney disease: Secondary | ICD-10-CM | POA: Diagnosis not present

## 2024-02-16 DIAGNOSIS — E559 Vitamin D deficiency, unspecified: Secondary | ICD-10-CM | POA: Diagnosis not present

## 2024-02-16 DIAGNOSIS — E538 Deficiency of other specified B group vitamins: Secondary | ICD-10-CM | POA: Diagnosis present

## 2024-02-16 DIAGNOSIS — F32A Depression, unspecified: Secondary | ICD-10-CM | POA: Insufficient documentation

## 2024-02-16 DIAGNOSIS — R590 Localized enlarged lymph nodes: Secondary | ICD-10-CM | POA: Diagnosis present

## 2024-02-16 DIAGNOSIS — F0390 Unspecified dementia without behavioral disturbance: Secondary | ICD-10-CM | POA: Diagnosis not present

## 2024-02-16 DIAGNOSIS — Z8673 Personal history of transient ischemic attack (TIA), and cerebral infarction without residual deficits: Secondary | ICD-10-CM | POA: Insufficient documentation

## 2024-02-16 DIAGNOSIS — N189 Chronic kidney disease, unspecified: Secondary | ICD-10-CM

## 2024-02-16 DIAGNOSIS — Z7982 Long term (current) use of aspirin: Secondary | ICD-10-CM | POA: Diagnosis not present

## 2024-02-16 DIAGNOSIS — I10 Essential (primary) hypertension: Secondary | ICD-10-CM | POA: Diagnosis not present

## 2024-02-16 DIAGNOSIS — F03A0 Unspecified dementia, mild, without behavioral disturbance, psychotic disturbance, mood disturbance, and anxiety: Secondary | ICD-10-CM | POA: Diagnosis present

## 2024-02-16 DIAGNOSIS — Z87891 Personal history of nicotine dependence: Secondary | ICD-10-CM | POA: Diagnosis not present

## 2024-02-16 DIAGNOSIS — R911 Solitary pulmonary nodule: Secondary | ICD-10-CM | POA: Diagnosis not present

## 2024-02-16 DIAGNOSIS — Z79899 Other long term (current) drug therapy: Secondary | ICD-10-CM | POA: Insufficient documentation

## 2024-02-16 DIAGNOSIS — J449 Chronic obstructive pulmonary disease, unspecified: Secondary | ICD-10-CM | POA: Diagnosis not present

## 2024-02-16 DIAGNOSIS — I509 Heart failure, unspecified: Secondary | ICD-10-CM

## 2024-02-16 DIAGNOSIS — M159 Polyosteoarthritis, unspecified: Secondary | ICD-10-CM

## 2024-02-16 DIAGNOSIS — D519 Vitamin B12 deficiency anemia, unspecified: Secondary | ICD-10-CM | POA: Diagnosis not present

## 2024-02-16 DIAGNOSIS — C3432 Malignant neoplasm of lower lobe, left bronchus or lung: Secondary | ICD-10-CM | POA: Diagnosis not present

## 2024-02-16 DIAGNOSIS — F17200 Nicotine dependence, unspecified, uncomplicated: Secondary | ICD-10-CM | POA: Diagnosis present

## 2024-02-16 DIAGNOSIS — C3412 Malignant neoplasm of upper lobe, left bronchus or lung: Secondary | ICD-10-CM | POA: Diagnosis present

## 2024-02-16 DIAGNOSIS — R918 Other nonspecific abnormal finding of lung field: Secondary | ICD-10-CM | POA: Diagnosis not present

## 2024-02-16 HISTORY — PX: ENDOBRONCHIAL ULTRASOUND: SHX5096

## 2024-02-16 HISTORY — PX: VIDEO BRONCHOSCOPY WITH ENDOBRONCHIAL NAVIGATION: SHX6175

## 2024-02-16 SURGERY — VIDEO BRONCHOSCOPY WITH ENDOBRONCHIAL NAVIGATION
Anesthesia: General | Laterality: Left

## 2024-02-16 MED ORDER — VITA-MIN PO CAPS: ORAL_CAPSULE | Freq: Every day | ORAL | Status: DC

## 2024-02-16 MED ORDER — METOPROLOL SUCCINATE 12.5 MG HALF TABLET
12.5000 mg | ORAL_TABLET | Freq: Every day | ORAL | Status: DC
  Administered 2024-02-16: 12.5 mg via ORAL
  Filled 2024-02-16: qty 1

## 2024-02-16 MED ORDER — APOAEQUORIN 10 MG PO CAPS: 1.0000 | ORAL_CAPSULE | Freq: Every day | ORAL | Status: DC

## 2024-02-16 MED ORDER — EZETIMIBE 10 MG PO TABS: 10.0000 mg | ORAL_TABLET | Freq: Every day | ORAL | Status: DC

## 2024-02-16 MED ORDER — DEXAMETHASONE SODIUM PHOSPHATE 10 MG/ML IJ SOLN
INTRAMUSCULAR | Status: DC | PRN
  Administered 2024-02-16: 10 mg via INTRAVENOUS

## 2024-02-16 MED ORDER — PROPOFOL 10 MG/ML IV BOLUS
INTRAVENOUS | Status: DC | PRN
  Administered 2024-02-16: 70 mg via INTRAVENOUS

## 2024-02-16 MED ORDER — ADULT MULTIVITAMIN W/MINERALS CH
1.0000 | ORAL_TABLET | Freq: Every day | ORAL | Status: DC
  Administered 2024-02-16: 1 via ORAL
  Filled 2024-02-16: qty 1

## 2024-02-16 MED ORDER — LACTATED RINGERS IV SOLN: INTRAVENOUS | Status: DC

## 2024-02-16 MED ORDER — LIDOCAINE 2% (20 MG/ML) 5 ML SYRINGE
INTRAMUSCULAR | Status: DC | PRN
  Administered 2024-02-16: 100 mg via INTRAVENOUS

## 2024-02-16 MED ORDER — ROCURONIUM BROMIDE 10 MG/ML (PF) SYRINGE
PREFILLED_SYRINGE | INTRAVENOUS | Status: DC | PRN
  Administered 2024-02-16: 35 mg via INTRAVENOUS

## 2024-02-16 MED ORDER — SUGAMMADEX SODIUM 200 MG/2ML IV SOLN
INTRAVENOUS | Status: DC | PRN
  Administered 2024-02-16: 200 mg via INTRAVENOUS

## 2024-02-16 MED ORDER — GLYCOPYRROLATE 0.2 MG/ML IJ SOLN
INTRAMUSCULAR | Status: DC | PRN
Start: 2024-02-16 — End: 2024-02-16
  Administered 2024-02-16: .2 mg via INTRAVENOUS

## 2024-02-16 MED ORDER — FENTANYL CITRATE (PF) 100 MCG/2ML IJ SOLN
INTRAMUSCULAR | Status: AC
  Filled 2024-02-16: qty 2

## 2024-02-16 MED ORDER — MEMANTINE HCL 5 MG PO TABS
5.0000 mg | ORAL_TABLET | Freq: Two times a day (BID) | ORAL | Status: DC
  Administered 2024-02-16: 5 mg via ORAL
  Filled 2024-02-16 (×2): qty 1

## 2024-02-16 MED ORDER — PHENYLEPHRINE HCL-NACL 20-0.9 MG/250ML-% IV SOLN
INTRAVENOUS | Status: DC | PRN
  Administered 2024-02-16: 40 ug/min via INTRAVENOUS

## 2024-02-16 MED ORDER — DEXMEDETOMIDINE HCL IN NACL 80 MCG/20ML IV SOLN
INTRAVENOUS | Status: DC | PRN
  Administered 2024-02-16: 8 ug via INTRAVENOUS

## 2024-02-16 MED ORDER — ATORVASTATIN CALCIUM 80 MG PO TABS: 80.0000 mg | ORAL_TABLET | Freq: Every day | ORAL | Status: DC

## 2024-02-16 MED ORDER — PHENYLEPHRINE 80 MCG/ML (10ML) SYRINGE FOR IV PUSH (FOR BLOOD PRESSURE SUPPORT)
PREFILLED_SYRINGE | INTRAVENOUS | Status: DC | PRN
  Administered 2024-02-16 (×3): 160 ug via INTRAVENOUS

## 2024-02-16 MED ORDER — LOSARTAN POTASSIUM 50 MG PO TABS: 100.0000 mg | ORAL_TABLET | Freq: Every day | ORAL | Status: DC

## 2024-02-16 MED ORDER — FENTANYL CITRATE (PF) 100 MCG/2ML IJ SOLN
INTRAMUSCULAR | Status: DC | PRN
  Administered 2024-02-16: 100 ug via INTRAVENOUS

## 2024-02-16 MED ORDER — TAMSULOSIN HCL 0.4 MG PO CAPS
0.4000 mg | ORAL_CAPSULE | Freq: Every day | ORAL | Status: DC
  Administered 2024-02-16: 0.4 mg via ORAL
  Filled 2024-02-16: qty 1

## 2024-02-16 MED ORDER — ONDANSETRON HCL 4 MG/2ML IJ SOLN
INTRAMUSCULAR | Status: DC | PRN
  Administered 2024-02-16: 4 mg via INTRAVENOUS

## 2024-02-16 MED ORDER — CHLORHEXIDINE GLUCONATE 0.12 % MT SOLN
15.0000 mL | Freq: Once | OROMUCOSAL | Status: AC
  Administered 2024-02-16: 15 mL via OROMUCOSAL
  Filled 2024-02-16: qty 15

## 2024-02-16 MED ORDER — HEPARIN SODIUM (PORCINE) 5000 UNIT/ML IJ SOLN
5000.0000 [IU] | Freq: Three times a day (TID) | INTRAMUSCULAR | Status: DC
  Administered 2024-02-16 – 2024-02-17 (×2): 5000 [IU] via SUBCUTANEOUS
  Filled 2024-02-16 (×2): qty 1

## 2024-02-16 MED ORDER — VITAMIN B-12 100 MCG PO TABS: 100.0000 ug | ORAL_TABLET | Freq: Every day | ORAL | Status: DC

## 2024-02-16 MED ORDER — SERTRALINE HCL 25 MG PO TABS
25.0000 mg | ORAL_TABLET | Freq: Every day | ORAL | Status: DC
  Administered 2024-02-16: 25 mg via ORAL
  Filled 2024-02-16 (×2): qty 1

## 2024-02-16 SURGICAL SUPPLY — 37 items
ADAPTER BRONCHOSCOPE OLYMPUS (ADAPTER) ×1 IMPLANT
ADAPTER VALVE BIOPSY EBUS (MISCELLANEOUS) IMPLANT
BAG COUNTER SPONGE SURGICOUNT (BAG) ×1 IMPLANT
BRUSH CYTOL CELLEBRITY 1.5X140 (MISCELLANEOUS) ×1 IMPLANT
BRUSH SUPERTRAX BIOPSY (INSTRUMENTS) IMPLANT
BRUSH SUPERTRAX NDL-TIP CYTO (INSTRUMENTS) ×1 IMPLANT
CANISTER SUCTION 3000ML PPV (SUCTIONS) ×1 IMPLANT
CNTNR URN SCR LID CUP LEK RST (MISCELLANEOUS) ×1 IMPLANT
COVER BACK TABLE 60X90IN (DRAPES) ×1 IMPLANT
FILTER STRAW FLUID ASPIR (MISCELLANEOUS) IMPLANT
FORCEPS BIOP 1.5 SINGLE USE (MISCELLANEOUS) ×1 IMPLANT
FORCEPS BIOP SUPERTRX PREMAR (INSTRUMENTS) ×1 IMPLANT
GAUZE SPONGE 4X4 12PLY STRL (GAUZE/BANDAGES/DRESSINGS) ×1 IMPLANT
GLOVE BIO SURGEON STRL SZ7.5 (GLOVE) ×2 IMPLANT
GOWN STRL REUS W/ TWL LRG LVL3 (GOWN DISPOSABLE) ×2 IMPLANT
KIT CLEAN ENDO COMPLIANCE (KITS) ×1 IMPLANT
KIT LOCATABLE GUIDE (CANNULA) IMPLANT
KIT MARKER FIDUCIAL DELIVERY (KITS) IMPLANT
KIT TURNOVER KIT B (KITS) ×1 IMPLANT
MARKER SKIN DUAL TIP RULER LAB (MISCELLANEOUS) ×1 IMPLANT
NDL SUPERTRX PREMARK BIOPSY (NEEDLE) ×1 IMPLANT
NEEDLE SUPERTRX PREMARK BIOPSY (NEEDLE) ×1 IMPLANT
NS IRRIG 1000ML POUR BTL (IV SOLUTION) ×1 IMPLANT
OIL SILICONE PENTAX (PARTS (SERVICE/REPAIRS)) ×1 IMPLANT
PAD ARMBOARD POSITIONER FOAM (MISCELLANEOUS) ×2 IMPLANT
PATCHES PATIENT (LABEL) ×3 IMPLANT
SYR 20ML ECCENTRIC (SYRINGE) ×1 IMPLANT
SYR 20ML LL LF (SYRINGE) ×1 IMPLANT
SYR 50ML SLIP (SYRINGE) ×1 IMPLANT
TOWEL GREEN STERILE FF (TOWEL DISPOSABLE) ×1 IMPLANT
TRAP SPECIMEN MUCUS 40CC (MISCELLANEOUS) IMPLANT
TUBE CONNECTING 20X1/4 (TUBING) ×1 IMPLANT
UNDERPAD 30X36 HEAVY ABSORB (UNDERPADS AND DIAPERS) ×1 IMPLANT
VALVE BIOPSY SINGLE USE (MISCELLANEOUS) ×1 IMPLANT
VALVE SUCTION BRONCHIO DISP (MISCELLANEOUS) ×1 IMPLANT
WATER STERILE IRR 1000ML POUR (IV SOLUTION) ×1 IMPLANT
fiducial IMPLANT

## 2024-02-16 NOTE — Op Note (Signed)
 Video Bronchoscopy with Endobronchial Ultrasound and Electromagnetic Navigation Procedure Note  Date of Operation: 02/16/2024  Pre-op Diagnosis: Left upper lobe nodule, mediastinal adenopathy  Post-op Diagnosis: Same  Surgeon: Roberto Wallace  Assistants: None  Anesthesia: General endotracheal anesthesia  Operation: Flexible video fiberoptic bronchoscopy with endobronchial ultrasound, robotic assisted navigation and biopsies.  Estimated Blood Loss: Minimal  Complications: None apparent  Indications and History: Roberto Wallace. is a 83 y.o. 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  He was found to have a pulmonary nodule and mediastinal adenopathy on lung cancer screening imaging.  Recommendation was made to achieve a tissue diagnosis using endobronchial ultrasound and robotic assisted navigational bronchoscopy. The risks, benefits, complications, treatment options and expected outcomes were discussed with the patient.  The possibilities of pneumothorax, pneumonia, reaction to medication, pulmonary aspiration, perforation of a viscus, bleeding, failure to diagnose a condition and creating a complication requiring transfusion or operation were discussed with the patient who freely signed the consent.    Description of Procedure: The patient was seen in the Preoperative Area, was examined and was deemed appropriate to proceed.  The patient was taken to [facility] Endoscopy room 3, identified as Roberto MALVA Nelwyn [name] and the procedure verified as Flexible Video Fiberoptic Bronchoscopy with robotic assisted navigation and endobronchial ultrasound.  A Time Out was held and the above information confirmed.   Robotic assisted navigation: Prior to the date of the procedure a high-resolution CT scan of the chest was performed. Utilizing ION software program a virtual tracheobronchial tree was generated to allow the creation of distinct navigation pathways to the patient's  parenchymal abnormalities. After being taken to the operating room general anesthesia was initiated and the patient  was orally intubated. The video fiberoptic bronchoscope was introduced via the endotracheal tube and a general inspection was performed which showed normal right and left lung anatomy. Aspiration of the bilateral mainstems was completed to remove any remaining secretions. Robotic catheter inserted into patient's endotracheal tube.   Target #1 left upper lobe nodule: The distinct navigation pathways prepared prior to this procedure were then utilized to navigate to patient's lesion identified on CT scan. The robotic catheter was secured into place and the vision probe was withdrawn.  Lesion location was approximated using fluoroscopy.  Local registration and targeting was performed using [employer] Cios mobile C-arm three-dimensional imaging. Under fluoroscopic guidance transbronchial brushings, transbronchial needle biopsies, and transbronchial forceps biopsies were performed to be sent for cytology and pathology.  Brush-in-lesion was confirmed using Cios mobile C-arm.  Under fluoroscopic guidance a single fiducial marker was placed adjacent to the nodule.    Endobronchial ultrasound: The robotic scope was then withdrawn and the endobronchial ultrasound was used to identify and characterize the peritracheal, hilar and bronchial lymph nodes. Inspection showed enlargement at station 4L and station 7. Using real-time ultrasound guidance Wang needle biopsies were take from Station 4L and 7 nodes and were sent for cytology.   At the end of the procedure a general airway inspection was performed and there was no evidence of active bleeding. The bronchoscope was removed.  The patient tolerated the procedure well. There was no significant blood loss and there were no obvious complications. A post-procedural chest x-ray is pending.  Samples Target #1: 1. Transbronchial brushings from  left upper lobe nodule 2. Transbronchial Wang needle biopsies from left upper lobe nodule 3. Transbronchial forceps biopsies from left upper lobe nodule   EBUS Samples: 1. Wang needle biopsies from 4L  node 2. Wang needle biopsies from 7 node    Roberto Chris, MD, PhD 02/16/2024, 3:56 PM [facility]

## 2024-02-16 NOTE — Anesthesia Procedure Notes (Signed)
 Procedure Name: Intubation Date/Time: 02/16/2024 2:48 PM  Performed by: Elby Raelene SAUNDERS, CRNAPre-anesthesia Checklist: Patient identified, Emergency Drugs available, Suction available and Patient being monitored Patient Re-evaluated:Patient Re-evaluated prior to induction Oxygen Delivery Method: Circle System Utilized Preoxygenation: Pre-oxygenation with 100% oxygen Induction Type: IV induction Ventilation: Mask ventilation without difficulty Laryngoscope Size: Miller and 3 Grade View: Grade II Tube type: Oral Tube size: 8.5 mm Number of attempts: 1 Airway Equipment and Method: Stylet Placement Confirmation: ETT inserted through vocal cords under direct vision, positive ETCO2 and breath sounds checked- equal and bilateral Secured at: 21 cm Tube secured with: Tape Dental Injury: Teeth and Oropharynx as per pre-operative assessment

## 2024-02-16 NOTE — Progress Notes (Signed)
Patient arrived in the unit

## 2024-02-16 NOTE — Transfer of Care (Signed)
 Immediate Anesthesia Transfer of Care Note  Patient: Roberto MALVA Metro [name]  Procedure(s) Performed: VIDEO BRONCHOSCOPY WITH ENDOBRONCHIAL NAVIGATION (Left) ENDOBRONCHIAL ULTRASOUND (EBUS) (Left)  Patient Location: PACU  Anesthesia Type:General  Level of Consciousness: awake, alert , and oriented  Airway & Oxygen Therapy: Patient Spontanous Breathing  Post-op Assessment: Report given to RN and Post -op Vital signs reviewed and stable  Post vital signs: Reviewed and stable  Last Vitals:  Vitals Value Taken Time  BP 135/98 02/16/24 16:05  Temp    Pulse 131 02/16/24 16:09  Resp 20 02/16/24 16:09  SpO2 100 % 02/16/24 16:09  Vitals shown include unfiled device data.  Last Pain:  Vitals:   02/16/24 1238  TempSrc:   PainSc: 0-No pain         Complications: No notable events documented.

## 2024-02-16 NOTE — Discharge Instructions (Signed)

## 2024-02-16 NOTE — Interval H&P Note (Signed)
 History and Physical Interval Note:  02/16/2024 2:29 PM  Roberto MALVA Metro [name]  has presented today for surgery, with the diagnosis of lung mass.  The various methods of treatment have been discussed with the patient and family. After consideration of risks, benefits and other options for treatment, the patient has consented to  Procedure(s): VIDEO BRONCHOSCOPY WITH ENDOBRONCHIAL NAVIGATION (Left) ENDOBRONCHIAL ULTRASOUND (EBUS) (Left) as a surgical intervention.  The patient's history has been reviewed, patient examined, no change in status, stable for surgery.  I have reviewed the patient's chart and labs.  Questions were answered to the patient's satisfaction.     Lamar GORMAN Chris

## 2024-02-17 DIAGNOSIS — R911 Solitary pulmonary nodule: Secondary | ICD-10-CM | POA: Diagnosis not present

## 2024-02-17 LAB — CYTOLOGY - NON PAP

## 2024-02-17 NOTE — Discharge Summary (Signed)
 Physician Discharge Summary  Patient ID: Roberto MIKES [name] MRN: [id number] DOB/AGE: [birth year] 83 y.o.  Admit date: 02/16/2024 Discharge date: 02/17/2024  Admission Diagnoses:  Discharge Diagnoses:  Principal Problem:   Lung nodule seen on imaging study Active Problems:   TOBACCO USER   Anxiety and depression   HYPERTENSION, BENIGN ESSENTIAL   Mild dementia (HCC)   Vitamin D  deficiency   B12 deficiency   Chronic obstructive pulmonary disease, unspecified (HCC)   Mediastinal adenopathy   Pulmonary nodule 1 cm or greater in diameter   Discharged Condition: good  Hospital Course:  Admitted for observation following Navigational Bronchoscopy and Endobronchial Ultrasound, lung and nodal biopsies, under general anesthesia. Tolerated well. CXR without complication. No problems overnight, and he is ready and stable for discharge back to his assisted living community on 02/17/24.   Consults: None  Significant Diagnostic Studies: bronchoscopy: Cytology on LUL nodule, mediastinal nodes pending at discharge  Treatments: Home meds for his COPD, HTN and other chronic conditions. No new meds added  Discharge Exam: Blood pressure (!) 117/96, pulse 75, temperature 98 F (36.7 C), temperature source Oral, resp. rate 17, height 5' 9 (1.753 m), weight 50.8 kg, SpO2 100%.  Gen: Pleasant, thin, in no distress,  normal affect  ENT: No lesions,  mouth clear,  oropharynx clear, no postnasal drip  Neck: No JVD, no stridor  Lungs: No use of accessory muscles, distant w a few scattered rhonchi. No wheeze.   Cardiovascular: RRR, heart sounds normal, no murmur or gallops, no peripheral edema  Abdomen: soft and NT, no HSM,  BS normal  Musculoskeletal: No deformities, no cyanosis or clubbing  Neuro: alert, awake, non focal  Skin: Warm, no lesions or rashes   Disposition: Discharge disposition: [facility] Care Institution Not Defined       Discharge Instructions     Diet -  low sodium heart healthy   Complete by: As directed    Discharge instructions   Complete by: As directed    Flexible Bronchoscopy, Care After This sheet gives you information about how to care for yourself after your test. Your doctor may also give you more specific instructions. If you have problems or questions, contact your doctor. Follow these instructions at home: Eating and drinking When you are wide awake, your numbness is gone and your cough and gag reflexes have come back, you may: Start eating only soft foods. Slowly drink liquids. Six hours after the test, go back to your normal diet. Driving Do not drive for 24 hours if you were given a medicine to help you relax (sedative). Do not drive or use heavy machinery while taking prescription pain medicine. General instructions Take over-the-counter and prescription medicines only as told by your doctor. Return to your normal activities as told. Ask what activities are safe for you. Do not use any products that have nicotine  or tobacco in them. This includes cigarettes and e-cigarettes. If you need help quitting, ask your doctor. Keep all follow-up visits as told by your doctor. This is important. It is very important if you had a tissue sample (biopsy) taken. Get help right away if: You have shortness of breath that gets worse. You get light-headed. You feel like you are going to pass out (faint). You have chest pain. You cough up: More than a little blood. More blood than before. Summary Do not use cigarettes. Do not use e-cigarettes. Seek care in the Emergency Department right away if you have chest pain or shortness of  breath. Call or MyChart Message our office for any questions or problems at [phone].    This information is not intended to replace advice given to you by your health care provider. Make sure you discuss any questions you have with your health care provider.   Increase activity slowly   Complete by: As  directed       Allergies as of 02/17/2024       Reactions   Amlodipine Rash   Brinzolamide-brimonidine Other (See Comments)   Pollen Extract Other (See Comments)   Other reaction(s): Sinusitis        Medication List     TAKE these medications    aspirin  EC 81 MG tablet Take 1 tablet (81 mg total) by mouth daily. Swallow whole.   atorvastatin  80 MG tablet Commonly known as: LIPITOR  Take 1 tablet (80 mg total) by mouth daily.   ezetimibe  10 MG tablet Commonly known as: ZETIA  Take 1 tablet (10 mg total) by mouth daily.   fexofenadine  180 MG tablet Commonly known as: ALLEGRA  180 mg.   losartan  100 MG tablet Commonly known as: COZAAR  Take 1 tablet (100 mg total) by mouth daily.   memantine  5 MG tablet Commonly known as: NAMENDA  Take 1 tablet (5 mg at night) for 2 weeks, then increase to 1 tablet (5 mg) twice a day   metoprolol  succinate 25 MG 24 hr tablet Commonly known as: Toprol  XL Take 0.5 tablets (12.5 mg total) by mouth at bedtime.   Prevagen 10 MG Caps Generic drug: Apoaequorin Take 1 tablet by mouth daily.   sertraline  25 MG tablet Commonly known as: ZOLOFT  Take 1 tablet (25 mg total) by mouth daily.   tamsulosin 0.4 MG Caps capsule Commonly known as: FLOMAX Take 0.4 mg by mouth at bedtime.   Thera-D 2000 50 MCG (2000 UT) Tabs Generic drug: Cholecalciferol 1 tab by mouth once daily   Tiotropium Bromide Monohydrate 1.25 MCG/ACT Aers Take by mouth.   triamcinolone cream 0.1 % Commonly known as: KENALOG Apply 1 Application topically 2 (two) times daily as needed (dry skin).   VITA-MIN PO Take 1 tablet by mouth daily at 6 (six) AM. Beef Root Supplement,Pt takes 1000 mg 1 tablet daily.   VITA-MIN PO Take 1 tablet by mouth daily at 6 (six) AM. Garlic Supplement ,Pt takes 1000 mg 1 tablet daily.   vitamin B-12 100 MCG tablet Commonly known as: CYANOCOBALAMIN  Take 1 tablet (100 mcg total) by mouth daily.         Signed: Lamar Wallace Chris 02/17/2024, 7:50 AM

## 2024-02-17 NOTE — H&P (Signed)
 NAME:  Roberto Wallace., MRN:  [id number], DOB:  October 05, 1940, LOS: 0 ADMISSION DATE:  02/16/2024, CHIEF COMPLAINT: Pulmonary nodule status post bronchoscopy  History of Present Illness:  83 year old man with history of active tobacco use, occupational asbestos exposure, hypertension, hyperlipidemia, migraines, prior CVA (no residual), history of syphilis and TB in the past. Noted to have peripheral left upper lobe nodule, enlarged mediastinal nodes on lung cancer screening imaging and then subsequent PET scan. He underwent uncomplicated robotic assisted navigational bronchoscopy on 6/24.  Chest x-ray post procedure without complication.  He is admitted for overnight monitoring with plans for discharge to home on 6/25. Post procedure denies any chest pain.  He does have some sore throat, cough.  No dyspnea.  Pertinent  Medical History   Past Medical History:  Diagnosis Date   Aneurysm (HCC)    per pt,in head   Arthritis    Chicken pox    Depression    Hypercholesteremia    Hypertension    Migraine    PTSD (post-traumatic stress disorder) 06/24/2017   Stroke (HCC)    No residual effects   Syphilis    Tuberculosis    Vitamin D  deficiency     Significant Hospital Events: Including procedures, antibiotic start and stop dates in addition to other pertinent events     Interim History / Subjective:  As per HPI No current complaints, ready to eat  Objective    Blood pressure 109/73, pulse 73, temperature 98.3 F (36.8 C), temperature source Oral, resp. rate 16, height 5' 9 (1.753 m), weight 50.8 kg, SpO2 100%.        Intake/Output Summary (Last 24 hours) at 02/17/2024 0708 Last data filed at 02/16/2024 1534 Gross per 24 hour  Intake 200 ml  Output --  Net 200 ml   Filed Weights   02/15/24 1423 02/16/24 1220  Weight: 50.8 kg 50.8 kg    Examination: General: Thin man in no distress HENT: Oropharynx clear, strong voice, no stridor Lungs: Distant, clear with few  scattered rhonchi Cardiovascular: Regular, no murmur Abdomen: Thin, soft, nondistended with positive bowel sounds Extremities: No edema Neuro: Awake, a bit sleepy post procedure.  Answers questions appropriately, follows commands. GU: Deferred  Resolved problem list   Assessment and Plan  Left upper lobe pulmonary nodule Mediastinal adenopathy - Post bronchoscopy 6/24 - Pulmonary hygiene, follow-up for any evidence of complication, chest x-ray 6/24 post procedure reassuring - Await cytology results  COPD - On home Stiolto, can substitute Anoro if he is not discharged in the a.m. - Albuterol  available as needed  Hypertension -Home losartan , metoprolol  XL  Hyperlipidemia, - Zetia  and Lipitor  ordered  History of CVA - Continue home aspirin   Dementia, -continue home Namenda , Prevagen  Depression - Continue home Zoloft   BPH - Continue home tamsulosin   Best Practice (right click and Reselect all SmartList Selections daily)   Diet/type: Regular consistency (see orders) DVT prophylaxis prophylactic heparin   Pressure ulcer(s): N/A GI prophylaxis: N/A Lines: N/A Foley:  N/A Code Status:  full code Last date of multidisciplinary goals of care discussion [pending]  Labs   CBC: No results for input(s): WBC, NEUTROABS, HGB, HCT, MCV, PLT in the last 168 hours.  Basic Metabolic Panel: No results for input(s): NA, K, CL, CO2, GLUCOSE, BUN, CREATININE, CALCIUM , MG, PHOS in the last 168 hours. GFR: CrCl cannot be calculated (Patient's most recent lab result is older than the maximum 21 days allowed.). No results for input(s): PROCALCITON, WBC, LATICACIDVEN in  the last 168 hours.  Liver Function Tests: No results for input(s): AST, ALT, ALKPHOS, BILITOT, PROT, ALBUMIN in the last 168 hours. No results for input(s): LIPASE, AMYLASE in the last 168 hours. No results for input(s): AMMONIA in the last 168  hours.  ABG    Component Value Date/Time   TCO2 28 10/16/2017 1124     Coagulation Profile: No results for input(s): INR, PROTIME in the last 168 hours.  Cardiac Enzymes: No results for input(s): CKTOTAL, CKMB, CKMBINDEX, TROPONINI in the last 168 hours.  HbA1C: Hgb A1c MFr Bld  Date/Time Value Ref Range Status  01/14/2024 11:04 AM 5.7 4.6 - 6.5 % Final    Comment:    Glycemic Control Guidelines for People with Diabetes:Non Diabetic:  <6%Goal of Therapy: <7%Additional Action Suggested:  >8%   06/03/2021 08:49 AM 5.6 4.6 - 6.5 % Final    Comment:    Glycemic Control Guidelines for People with Diabetes:Non Diabetic:  <6%Goal of Therapy: <7%Additional Action Suggested:  >8%     CBG: No results for input(s): GLUCAP in the last 168 hours.  Review of Systems:   As per HPI  Past Medical History:  He,  has a past medical history of Aneurysm (HCC), Arthritis, Chicken pox, Depression, Hypercholesteremia, Hypertension, Migraine, PTSD (post-traumatic stress disorder) (06/24/2017), Stroke (HCC), Syphilis, Tuberculosis, and Vitamin D  deficiency.   Surgical History:   Past Surgical History:  Procedure Laterality Date   BUBBLE STUDY  08/15/2021   Procedure: BUBBLE STUDY;  Surgeon: Jeffrie Oneil BROCKS, MD;  Location: [facility] ENDOSCOPY;  Service: Cardiovascular;;   COLONOSCOPY     HERNIA REPAIR Right    x 2   TEE WITHOUT CARDIOVERSION N/A 08/15/2021   Procedure: TRANSESOPHAGEAL ECHOCARDIOGRAM (TEE);  Surgeon: Jeffrie Oneil BROCKS, MD;  Location: [facility] ENDOSCOPY;  Service: Cardiovascular;  Laterality: N/A;     Social History:   reports that he quit smoking about 2 years ago. His smoking use included cigarettes. He started smoking about 52 years ago. He has a 25 pack-year smoking history. He has never used smokeless tobacco. He reports that he does not currently use alcohol after a past usage of about 2.0 - 3.0 standard drinks of alcohol per week. He reports that he does not use drugs.    Family History:  His family history includes Dementia in his mother; Healthy in his father; Prostate cancer in his brother.   Allergies Allergies  Allergen Reactions   Amlodipine Rash   Brinzolamide-Brimonidine Other (See Comments)   Pollen Extract Other (See Comments)    Other reaction(s): Sinusitis     Home Medications  Prior to Admission medications   Medication Sig Start Date End Date Taking? Authorizing Provider  Apoaequorin (PREVAGEN) 10 MG CAPS Take 1 tablet by mouth daily.   Yes [provider]  aspirin  EC 81 MG tablet Take 1 tablet (81 mg total) by mouth daily. Swallow whole. 05/07/22  Yes Norleen Lynwood ORN, MD  atorvastatin  (LIPITOR ) 80 MG tablet Take 1 tablet (80 mg total) by mouth daily. 06/26/23  Yes Thukkani, Arun K, MD  Cholecalciferol (THERA-D 2000) 50 MCG (2000 UT) TABS 1 tab by mouth once daily 06/03/21  Yes Norleen Lynwood ORN, MD  ezetimibe  (ZETIA ) 10 MG tablet Take 1 tablet (10 mg total) by mouth daily. 09/09/23  Yes Thukkani, Arun K, MD  fexofenadine  (ALLEGRA ) 180 MG tablet 180 mg. 09/10/21  Yes [provider]  losartan  (COZAAR ) 100 MG tablet Take 1 tablet (100 mg total) by mouth daily. 11/24/22  Yes Wyn Jackee VEAR Mickey., NP  memantine  (NAMENDA ) 5 MG tablet Take 1 tablet (5 mg at night) for 2 weeks, then increase to 1 tablet (5 mg) twice a day 12/09/22  Yes Wertman, Sara E, PA-C  metoprolol  succinate (TOPROL  XL) 25 MG 24 hr tablet Take 0.5 tablets (12.5 mg total) by mouth at bedtime. 11/11/22  Yes Wyn Jackee VEAR Mickey., NP  Multiple Vitamins-Minerals (VITA-MIN PO) Take 1 tablet by mouth daily at 6 (six) AM. Beef Root Supplement,Pt takes 1000 mg 1 tablet daily.   Yes [provider]  Multiple Vitamins-Minerals (VITA-MIN PO) Take 1 tablet by mouth daily at 6 (six) AM. Garlic Supplement ,Pt takes 1000 mg 1 tablet daily.   Yes [provider]  sertraline  (ZOLOFT ) 25 MG tablet Take 1 tablet (25 mg total) by mouth daily. 06/03/21  Yes Norleen Lynwood ORN, MD   tamsulosin (FLOMAX) 0.4 MG CAPS capsule Take 0.4 mg by mouth at bedtime. 09/15/22  Yes [provider]  Tiotropium Bromide Monohydrate 1.25 MCG/ACT AERS Take by mouth. 11/18/23  Yes [provider]  triamcinolone cream (KENALOG) 0.1 % Apply 1 Application topically 2 (two) times daily as needed (dry skin).   Yes [provider]  vitamin B-12 (CYANOCOBALAMIN ) 100 MCG tablet Take 1 tablet (100 mcg total) by mouth daily. 12/13/20  Yes Norleen Lynwood ORN, MD     Critical care time: NA      [name], MD, PhD 02/17/2024, 7:08 AM [facility] [phone] or if no answer before 7:00PM call [phone] For any issues after 7:00PM please call eLink [phone]

## 2024-02-17 NOTE — TOC Initial Note (Signed)
 Transition of Care (TOC) - Initial/Assessment Note   Spoke to patient. He is not from ALF. Lives in an apt independent . His ride is on the way. He is dressed and asking for DC papers . NCM secure chatted team.  Patient uses a cane at home  Patient Details  Name: Roberto SENNA [name] MRN: [id number] Date of Birth: 1940-12-13  Transition of Care [facility]) CM/SW Contact:    Stephane Powell Jansky, RN Phone Number: 02/17/2024, 9:35 AM  Clinical Narrative:                   Expected Discharge Plan: Home/Self Care Barriers to Discharge: No Barriers Identified   Patient Goals and CMS Choice Patient states their goals for this hospitalization and ongoing recovery are:: to return to home   Choice offered to / list presented to : NA      Expected Discharge Plan and Services In-house Referral: NA Discharge Planning Services: CM Consult Post Acute Care Choice: NA Living arrangements for the past 2 months: Apartment Expected Discharge Date: 02/17/24               DME Arranged: N/A DME Agency: NA       HH Arranged: NA          Prior Living Arrangements/Services Living arrangements for the past 2 months: Apartment Lives with:: Self Patient language and need for interpreter reviewed:: Yes Do you feel safe going back to the place where you live?: Yes      Need for Family Participation in Patient Care: Yes (Comment) Care giver support system in place?: Yes (comment) Current home services: DME Criminal Activity/Legal Involvement Pertinent to Current Situation/Hospitalization: No - Comment as needed  Activities of Daily Living      Permission Sought/Granted   Permission granted to share information with : No              Emotional Assessment Appearance:: Appears stated age Attitude/Demeanor/Rapport: Engaged Affect (typically observed): Appropriate Orientation: : Oriented to Self, Oriented to Place, Oriented to  Time, Oriented to Situation Alcohol / Substance Use: Not  Applicable Psych Involvement: No (comment)  Admission diagnosis:  Lung nodule seen on imaging study [R91.1] Pulmonary nodule 1 cm or greater in diameter [R91.1] Patient Active Problem List   Diagnosis Date Noted   Mediastinal adenopathy 02/16/2024   Pulmonary nodule 1 cm or greater in diameter 02/16/2024   Lung nodule seen on imaging study 02/08/2024   Moderate protein-calorie malnutrition (HCC) 01/15/2024   Chronic obstructive pulmonary disease, unspecified (HCC) 01/14/2024   Complete loss of teeth due to caries, class I 01/14/2024   Nontraumatic incomplete tear of right rotator cuff 01/14/2024   Other abnormalities of gait and mobility 01/14/2024   Lesion of skin of face 05/10/2022   Peripheral arterial occlusive disease (HCC) 09/23/2021   Major depressive disorder, recurrent episode (HCC) 09/23/2021   Inguinal hernia 09/23/2021   Hypertensive heart and chronic kidney disease with heart failure and stage 1 through stage 4 chronic kidney disease, or unspecified chronic kidney disease (HCC) 09/23/2021   Headache 09/23/2021   Glaucoma 09/23/2021   Cocaine abuse (HCC) 09/23/2021   Claudication (HCC) 09/23/2021   Acquired deformities of toe(s), unspecified, left foot 09/23/2021   Gait disorder 09/23/2021   BPH (benign prostatic hyperplasia) 07/12/2021   NSVT (nonsustained ventricular tachycardia) (HCC) 07/12/2021   Near syncope 06/03/2021   Pedal edema 01/01/2021   Nail disorder 01/01/2021   Homelessness 12/12/2020   Microhematuria 02/01/2020   Allergic  rhinitis 02/01/2020   History of colonic polyps 02/01/2020   Vitamin D  deficiency 03/12/2019   B12 deficiency 03/12/2019   Hyperglycemia 03/10/2019   Cough 03/10/2019   History of exposure to asbestos 03/10/2019   Hyperlipidemia 10/16/2017   Depression 10/16/2017   Stroke-like symptoms 10/16/2017   Mild dementia (HCC) 10/16/2017   Lateral epicondylitis, right elbow 06/26/2017   PTSD (post-traumatic stress disorder) 06/24/2017    Right wrist pain 06/09/2016   Osteoarthritis 02/05/2016   Tuberculosis 02/05/2016   Loss of weight 10/31/2015   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   Neuropathy 10/03/2015   Intermittent memory loss 10/03/2015   Decreased mobility 10/03/2015   Encounter for well adult exam with abnormal findings 04/16/2015   Medicare annual wellness visit, subsequent 04/16/2015   TOBACCO USER 06/14/2010   Anxiety and depression 11/19/2009   HYPERTENSION, BENIGN ESSENTIAL 11/19/2009   Other specified cardiac dysrhythmias(427.89) 11/19/2009   GEN OSTEOARTHROSIS INVOLVING MULTIPLE SITES 11/19/2009   History of cardiovascular disorder 11/19/2009   Esophageal reflux 08/25/1997   Backache 08/26/1975   PCP:  Norleen Lynwood ORN, MD Pharmacy:   [id number] [address] ([place]), [place] - [address] [place] ([place]) [place] [address] Phone: [phone] Fax: [phone]  [facility] - [place], [place] - [address] [place] [address] Phone: [phone] Fax: [phone]     Social Drivers of Health (SDOH) Social History: SDOH Screenings   Food Insecurity: No Food Insecurity (02/16/2024)  Housing: Unknown (02/16/2024)  Transportation Needs: No Transportation Needs (02/16/2024)  Utilities: Not At Risk (02/16/2024)  Alcohol Screen: Low Risk  (08/17/2023)  Depression (PHQ2-9): Low Risk  (01/14/2024)  Financial Resource Strain: Low Risk  (08/17/2023)  Physical Activity: Inactive (08/17/2023)  Social Connections: Socially Isolated (02/16/2024)  Stress: No Stress Concern Present (08/17/2023)  Tobacco Use: Medium Risk (02/16/2024)  Health Literacy: Adequate Health Literacy (08/17/2023)   SDOH Interventions: Food Insecurity Interventions: Intervention Not Indicated Housing Interventions: Intervention Not Indicated Transportation Interventions: Intervention Not  Indicated Utilities Interventions: Intervention Not Indicated Social Connections Interventions: Intervention Not Indicated   Readmission Risk Interventions     No data to display

## 2024-02-17 NOTE — Anesthesia Postprocedure Evaluation (Addendum)
 Anesthesia Post Note  Patient: Roberto NICKLES [name]  Procedure(s) Performed: VIDEO BRONCHOSCOPY WITH ENDOBRONCHIAL NAVIGATION (Left) ENDOBRONCHIAL ULTRASOUND (EBUS) (Left)     Patient location during evaluation: PACU Anesthesia Type: General Level of consciousness: awake and alert Pain management: pain level controlled Vital Signs Assessment: post-procedure vital signs reviewed and stable Respiratory status: spontaneous breathing, nonlabored ventilation, respiratory function stable and patient connected to nasal cannula oxygen Cardiovascular status: blood pressure returned to baseline and stable Postop Assessment: no apparent nausea or vomiting Anesthetic complications: no   No notable events documented.  Last Vitals:  Vitals:   02/17/24 0433 02/17/24 0737  BP: 109/73 (!) 117/96  Pulse: 73 75  Resp:  17  Temp: 36.8 C 36.7 C  SpO2: 100% 100%    Last Pain:  Vitals:   02/17/24 0737  TempSrc: Oral  PainSc:                  Lynwood MARLA Cornea

## 2024-02-17 NOTE — Plan of Care (Signed)
  Problem: Education: Goal: Knowledge of General Education information will improve Description: Including pain rating scale, medication(s)/side effects and non-pharmacologic comfort measures Outcome: Progressing   Problem: Activity: Goal: Ability to tolerate increased activity will improve Outcome: Progressing   Problem: Respiratory: Goal: Ability to maintain a clear airway will improve Outcome: Progressing

## 2024-02-18 ENCOUNTER — Encounter (HOSPITAL_COMMUNITY): Payer: Self-pay | Admitting: Emergency Medicine

## 2024-02-18 LAB — CYTOLOGY - NON PAP

## 2024-02-22 ENCOUNTER — Encounter: Payer: Self-pay | Admitting: Acute Care

## 2024-02-22 ENCOUNTER — Ambulatory Visit (INDEPENDENT_AMBULATORY_CARE_PROVIDER_SITE_OTHER): Admitting: Acute Care

## 2024-02-22 VITALS — BP 128/79 | HR 90 | Ht 69.0 in | Wt 112.0 lb

## 2024-02-22 DIAGNOSIS — C3490 Malignant neoplasm of unspecified part of unspecified bronchus or lung: Secondary | ICD-10-CM

## 2024-02-22 DIAGNOSIS — Z9889 Other specified postprocedural states: Secondary | ICD-10-CM

## 2024-02-22 DIAGNOSIS — Z87891 Personal history of nicotine dependence: Secondary | ICD-10-CM | POA: Diagnosis not present

## 2024-02-22 DIAGNOSIS — C3412 Malignant neoplasm of upper lobe, left bronchus or lung: Secondary | ICD-10-CM | POA: Diagnosis not present

## 2024-02-22 NOTE — Progress Notes (Signed)
 History of Present Illness De Jaworski. is a 83 y.o. male former smoker with a 25-pack-year smoking history.  Patient quit smoking in 2023.  Referred to Dr. Shelah for an abnormal pulmonary nodule for evaluation June 2025 by the [facility].  The abnormal lung cancer screening was what prompted the referral.   02/22/2024 Pt. Presents for  follow up after bronchoscopy with biopsies . He states he has done well after the procedure. No bleeding, fever, discolored secretions, dyspnea , or adverse reaction to anesthesia. He did stay overnight due to not having family to monitor him x 24 hours after the procedure.   We have discussed his results. The biopsy of the left upper lobe nodule was positive for adenocarcinoma.  Lymph nodes that were biopsied were negative for malignancy. 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 He is in a wheelchair, and prefers his treatment in [place] due to transportation issues.I have referred him to both medical oncology and radiation oncology in [place]. There was notation of a  hypermetabolic area along the inferior margin of the left kidney on PET. An underlying lesion is possible and could be an aggressive process. He has a follow up CT Abdomen and Pelvis scheduled 02/25/2024 at the [facility] as follow up. I confirmed this by phone.   Referrals have been made, and patient has no further questions regarding his biopsies or plan for treatment. I have also ordered MR Brain to complete staging. He understands he will get phone calls to schedule his consults ups.    Test Results: Cytology 02/16/2024 FINAL MICROSCOPIC DIAGNOSIS:  A. LUNG, LUL, BRUSHING:  - Adenocarcinoma   B. LUNG, LUL, FINE NEEDLE ASPIRATION AND BIOPSIES:  - Adenocarcinoma    C. LYMPH NODE, 4L, FINE NEEDLE ASPIRATION:  - No malignant cells identified   D. LYMPH NODE, STATION 7, FINE NEEDLE ASPIRATION:  - No malignant cells identified     02/09/2024 Super D CT Chest The previous tracer avid lesion within the lateral, subpleural left upper lobe measures 2.1 cm, previously 1.7 cm. This is concerning for primary bronchogenic carcinoma. 2. Additional, non tracer avid scattered lung nodules are identified which are unchanged in the interval. 3. Emphysema and diffuse bronchial wall thickening. 4. Coronary artery calcifications. 5. Aortic Atherosclerosis (ICD10-I70.0) and Emphysema (ICD10-J43.9).   PET scan 01/21/2024 Hypermetabolic 17 mm peripheral left upper lobe lung nodule identified. Not seen on the study of 2024. This worrisome for potential neoplasm the.   Hypermetabolic area along the inferior margin of the left kidney somewhat exophytic. Underlying lesion is possible and could be an aggressive process. Would recommend additional workup with a contrast CT scan of the abdomen and pelvis when appropriate to exclude mass lesion.   4.1 cm in for abdominal aortic aneurysm. Recommend follow-up CT or     Latest Ref Rng & Units 01/14/2024   11:04 AM 12/09/2022    8:55 AM 08/14/2021    1:20 PM  CBC  WBC 4.0 - 10.5 K/uL 5.9  6.4  6.5   Hemoglobin 13.0 - 17.0 g/dL 87.4  86.0  86.2   Hematocrit 39.0 - 52.0 % 38.4  42.5  40.5   Platelets 150.0 - 400.0 K/uL 214.0  222.0  231        Latest Ref Rng & Units 01/14/2024   11:04 AM 11/25/2022   10:55 AM 11/11/2022   10:24 AM  BMP  Glucose 70 - 99  mg/dL 885  90  86   BUN 6 - 23 mg/dL 14  10  6    Creatinine 0.40 - 1.50 mg/dL 8.82  9.10  9.04   BUN/Creat Ratio 10 - 24  11  6    Sodium 135 - 145 mEq/L 139  143  143   Potassium 3.5 - 5.1 mEq/L 4.3  4.3  4.4   Chloride 96 - 112 mEq/L 102  102  102   CO2 19 - 32 mEq/L 32  24  24   Calcium  8.4 - 10.5 mg/dL 9.1  9.6  9.3     BNP No results found for: BNP  ProBNP No results found for: PROBNP  PFT    Component Value Date/Time   FEV1PRE 1.94 11/28/2021 1131   FEV1POST 2.23 11/28/2021 1131   FVCPRE 2.96 11/28/2021  1131   FVCPOST 3.09 11/28/2021 1131   DLCOUNC 19.49 11/28/2021 1131   PREFEV1FVCRT 65 11/28/2021 1131   PSTFEV1FVCRT 72 11/28/2021 1131    CT SUPER D CHEST WO CONTRAST Result Date: 02/20/2024 CLINICAL DATA:  Follow-up lung nodule. * Tracking Code: BO * EXAM: CT CHEST WITHOUT CONTRAST TECHNIQUE: Multidetector CT imaging of the chest was performed using thin slice collimation for electromagnetic bronchoscopy planning purposes, without intravenous contrast. RADIATION DOSE REDUCTION: This exam was performed according to the departmental dose-optimization program which includes automated exposure control, adjustment of the mA and/or kV according to patient size and/or use of iterative reconstruction technique. COMPARISON:  PET-CT from 01/21/2024 FINDINGS: Cardiovascular: heart size is normal. No pericardial effusion. Aortic atherosclerosis. Coronary artery calcifications. Mediastinum/Nodes: Thyroid  gland, trachea and esophagus appear normal. No enlarged mediastinal lymph nodes. Hilar lymph nodes are suboptimally evaluated due to lack of IV contrast. Lungs/Pleura: There is emphysema with diffuse bronchial wall thickening. Bilateral peripheral and lower lung zone predominant interstitial reticulation is identified. Scattered areas scarring and architectural distortion. Scattered lung nodules are identified. The previous tracer avid lesion within the lateral, subpleural left upper lobe measures 2.1 cm, image 67/5. Previously 1.7 cm. 6 mm nodule in the posterior right upper lobe is unchanged from 09/25/2022 and did not demonstrate any tracer uptake on the recent PET-CT. Additional lung nodules within the right upper lobe and anterior left upper lobe are unchanged in the interval. Upper Abdomen: No acute abnormality. Bilateral kidney cysts noted within the imaged portions of the kidneys. Measure up to 2.6 cm and measure fluid attenuation compatible with benign Bosniak class 1 cyst. No follow-up imaging recommended.  Multiple small less than 1 cm low-attenuation liver lesions are noted which are technically too small to characterize but appears similar to previous imaging. Aortic atherosclerosis that unchanged left adrenal nodule measuring 3.3 by 1.9 cm not tracer avid on recent PET-CT. This is been unchanged since 2017 and most likely represents a benign adenoma. No follow-up imaging recommended. Musculoskeletal: Remote left lateral rib fracture deformities. No acute or suspicious osseous abnormality. IMPRESSION: 1. The previous tracer avid lesion within the lateral, subpleural left upper lobe measures 2.1 cm, previously 1.7 cm. This is concerning for primary bronchogenic carcinoma. 2. Additional, non tracer avid scattered lung nodules are identified which are unchanged in the interval. 3. Emphysema and diffuse bronchial wall thickening. 4. Coronary artery calcifications. 5. Aortic Atherosclerosis (ICD10-I70.0) and Emphysema (ICD10-J43.9). Electronically Signed   By: Waddell Calk M.D.   On: 02/20/2024 07:28   DG Chest Port 1 View Result Date: 02/16/2024 CLINICAL DATA:  Status post bronchoscopy EXAM: PORTABLE CHEST 1 VIEW COMPARISON:  CT of the chest  02/09/2024.  Chest x-ray 07/09/2021. FINDINGS: Patchy focal/nodular opacities in the peripheral left upper lobe appear unchanged. The lungs are otherwise clear. There is no pleural effusion or pneumothorax identified. The cardiomediastinal silhouette is within normal limits. There are multiple healed left-sided rib fractures. IMPRESSION: 1. No pneumothorax identified. 2. Stable patchy focal/nodular opacities in the peripheral left upper lobe. Electronically Signed   By: Greig Pique M.D.   On: 02/16/2024 16:24   DG C-ARM BRONCHOSCOPY Result Date: 02/16/2024 C-ARM BRONCHOSCOPY: Fluoroscopy was utilized by the requesting physician.  No radiographic interpretation.     Past medical hx Past Medical History:  Diagnosis Date   Aneurysm (HCC)    per pt,in head   Arthritis     Chicken pox    Depression    Hypercholesteremia    Hypertension    Migraine    PTSD (post-traumatic stress disorder) 06/24/2017   Stroke (HCC)    No residual effects   Syphilis    Tuberculosis    Vitamin D  deficiency      Social History   Tobacco Use   Smoking status: Former    Current packs/day: 0.00    Average packs/day: 0.5 packs/day for 50.0 years (25.0 ttl pk-yrs)    Types: Cigarettes    Start date: 10/30/1971    Quit date: 10/29/2021    Years since quitting: 2.3   Smokeless tobacco: Never  Vaping Use   Vaping status: Never Used  Substance Use Topics   Alcohol use: Not Currently    Alcohol/week: 2.0 - 3.0 standard drinks of alcohol    Types: 2 - 3 Shots of liquor per week   Drug use: No    Mr.Ostlund reports that he quit smoking about 2 years ago. His smoking use included cigarettes. He started smoking about 52 years ago. He has a 25 pack-year smoking history. He has never used smokeless tobacco. He reports that he does not currently use alcohol after a past usage of about 2.0 - 3.0 standard drinks of alcohol per week. He reports that he does not use drugs.  Tobacco Cessation: Former smoker with a 25 pack year smoking history, quit 2023   Past surgical hx, Family hx, Social hx all reviewed.  Current Outpatient Medications on File Prior to Visit  Medication Sig   Apoaequorin (PREVAGEN) 10 MG CAPS Take 1 tablet by mouth daily.   aspirin  EC 81 MG tablet Take 1 tablet (81 mg total) by mouth daily. Swallow whole.   atorvastatin  (LIPITOR ) 80 MG tablet Take 1 tablet (80 mg total) by mouth daily.   Cholecalciferol (THERA-D 2000) 50 MCG (2000 UT) TABS 1 tab by mouth once daily   ezetimibe  (ZETIA ) 10 MG tablet Take 1 tablet (10 mg total) by mouth daily.   fexofenadine  (ALLEGRA ) 180 MG tablet 180 mg.   losartan  (COZAAR ) 100 MG tablet Take 1 tablet (100 mg total) by mouth daily.   memantine  (NAMENDA ) 5 MG tablet Take 1 tablet (5 mg at night) for 2 weeks, then increase to 1  tablet (5 mg) twice a day   metoprolol  succinate (TOPROL  XL) 25 MG 24 hr tablet Take 0.5 tablets (12.5 mg total) by mouth at bedtime.   Multiple Vitamins-Minerals (VITA-MIN PO) Take 1 tablet by mouth daily at 6 (six) AM. Beef Root Supplement,Pt takes 1000 mg 1 tablet daily.   Multiple Vitamins-Minerals (VITA-MIN PO) Take 1 tablet by mouth daily at 6 (six) AM. Garlic Supplement ,Pt takes 1000 mg 1 tablet daily.   sertraline  (ZOLOFT ) 25  MG tablet Take 1 tablet (25 mg total) by mouth daily.   tamsulosin  (FLOMAX ) 0.4 MG CAPS capsule Take 0.4 mg by mouth at bedtime.   Tiotropium Bromide Monohydrate 1.25 MCG/ACT AERS Take by mouth.   triamcinolone cream (KENALOG) 0.1 % Apply 1 Application topically 2 (two) times daily as needed (dry skin).   vitamin B-12 (CYANOCOBALAMIN ) 100 MCG tablet Take 1 tablet (100 mcg total) by mouth daily.   No current facility-administered medications on file prior to visit.     Allergies  Allergen Reactions   Amlodipine Rash   Brinzolamide-Brimonidine Other (See Comments)   Pollen Extract Other (See Comments)    Other reaction(s): Sinusitis    Review Of Systems:  Constitutional:   No  weight loss, night sweats,  Fevers, chills, fatigue, or  lassitude.  HEENT:   No headaches,  Difficulty swallowing,  Tooth/dental problems, or  Sore throat,                No sneezing, itching, ear ache, nasal congestion, post nasal drip,   CV:  No chest pain,  Orthopnea, PND, swelling in lower extremities, anasarca, dizziness, palpitations, syncope.   GI  No heartburn, indigestion, abdominal pain, nausea, vomiting, diarrhea, change in bowel habits, loss of appetite, bloody stools.   Resp: No shortness of breath with exertion or at rest.  No excess mucus, no productive cough,  No non-productive cough,  No coughing up of blood.  No change in color of mucus.  No wheezing.  No chest wall deformity  Skin: no rash or lesions.  GU: no dysuria, change in color of urine, no urgency or  frequency.  No flank pain, no hematuria   MS:  No joint pain or swelling.  No decreased range of motion.  No back pain.  Psych:  No change in mood or affect. No depression or anxiety.  No memory loss.   Vital Signs BP 128/79 (BP Location: Right Arm, Cuff Size: Normal)   Pulse 90   Ht 5' 9 (1.753 m)   Wt 112 lb (50.8 kg)   SpO2 99%   BMI 16.54 kg/m    Physical Exam:  General- No distress,  A&Ox3, pleasant ENT: No sinus tenderness, TM clear, pale nasal mucosa, no oral exudate,no post nasal drip, no LAN Cardiac: S1, S2, regular rate and rhythm, no murmur Chest: + faint wheeze/ rales/ dullness; no accessory muscle use, no nasal flaring, no sternal retractions Abd.: Soft Non-tender, ND, BS +, Body mass index is 16.54 kg/m.  Ext: No clubbing cyanosis, edema Neuro: Physical deconditioning, MAE x 4, A&O x 3 Skin: No rashes, warm and dry, no obvious deformities Psych: normal mood and behavior   Assessment/Plan Post Bronchoscopy with biopsies New diagnosis adenocarcinoma of the lung Former smoker Plan I am glad you have done well after the biopsy. We have reviewed your biopsy results. The biopsy was positive for adenocarcinoma, which is a non small cell lung  cancer. I will refer you to radiation oncology and medical oncology in [place] to discuss treatment options. I will call to make sure you have follow up of the kidney finding noted on the PET scan with the CT [facility].>> confirmed by phone 02/22/2024. I will also order an MR Brain to complete staging of your lung cancer. Call us  if you need us   They will take great care of you at the cancer center.  Metta Sours with treatment. Continue using your inhalers as you have  been doing.  Please contact office for sooner follow up if symptoms do not improve or worsen or seek emergency care     I spent 37 minutes dedicated to the care of this patient on the date of this encounter to include pre-visit  review of records, face-to-face time with the patient discussing conditions above, post visit ordering of testing, clinical documentation with the electronic health record, making appropriate referrals as documented, and communicating necessary information to the patient's healthcare team.    Lauraine JULIANNA Lites, NP 02/22/2024  11:14 AM

## 2024-02-22 NOTE — Patient Instructions (Addendum)
 It is good to see you today. I am glad you have done well after the biopsy. We have reviewed your biopsy results. The biopsy was positive for adenocarcinoma, which is a non small cell lung  cancer. I will refer you to radiation oncology and medical oncology in [place] to discuss treatment options. I will call to make sure you have follow up of the kidney finding noted on the PET scan with the CT scan you are having 02/25/2024 by the [facility]. I will also order an MR Brain to complete staging of your lung cancer. Call us  if you need us   They will take great care of you at the cancer center.  Metta Sours with treatment. Continue using your inhalers as you have been doing.  Please contact office for sooner follow up if symptoms do not improve or worsen or seek emergency care

## 2024-03-01 ENCOUNTER — Ambulatory Visit (HOSPITAL_COMMUNITY): Admission: RE | Admit: 2024-03-01 | Source: Ambulatory Visit

## 2024-03-01 NOTE — Progress Notes (Signed)
 Appts cancelled after confirming with Andrea Muza, NN at [facility], that the pt is already established with medical oncology at the [facility]. Roberto Wallace states she will reach out to the pt to let him know he won't be seen at [facility].

## 2024-03-02 NOTE — Progress Notes (Signed)
 Thoracic Location of Tumor / Histology: Left Upper Lobe Lung  Patient presented   MRI Brain 03/01/2024:  CT Super D Chest 02/09/2024: The previous tracer avid lesion within the lateral, subpleural left upper lobe measures 2.1 cm, previously 1.7 cm. This is concerning for primary bronchogenic carcinoma.   Biopsies of Left Upper Lobe Lung 02/16/2024    Past/Anticipated interventions by pulmonary, if any:  Lauraine Lites NP 02/22/2024 -We have discussed his results. The biopsy of the left upper lobe nodule was positive for adenocarcinoma.  Lymph nodes that were biopsied were negative for malignancy. -I have referred him to both medical oncology and radiation oncology in [place].    Past/Anticipated interventions by cardiothoracic surgery, if any:   Past/Anticipated interventions by medical oncology, if any:    Tobacco/Marijuana/Snuff/ETOH use: Former Smoker, quit 2023.  Signs/Symptoms Weight changes, if any:  Respiratory complaints, if any:  Hemoptysis, if any:  Pain issues, if any:    SAFETY ISSUES: Prior radiation?  Pacemaker/ICD?   Possible current pregnancy? N/a Is the patient on methotrexate?   Current Complaints / other details:

## 2024-03-03 ENCOUNTER — Other Ambulatory Visit

## 2024-03-03 ENCOUNTER — Ambulatory Visit
Admission: RE | Admit: 2024-03-03 | Discharge: 2024-03-03 | Disposition: A | Discharge status: Home / Self Care | Source: Ambulatory Visit | Attending: Radiation Oncology | Admitting: Radiation Oncology

## 2024-03-03 ENCOUNTER — Ambulatory Visit: Admitting: Physician Assistant

## 2024-03-03 ENCOUNTER — Other Ambulatory Visit: Payer: Self-pay

## 2024-03-03 ENCOUNTER — Encounter: Payer: Self-pay | Admitting: Radiation Oncology

## 2024-03-03 VITALS — Ht 68.9 in | Wt 112.5 lb

## 2024-03-03 DIAGNOSIS — C3412 Malignant neoplasm of upper lobe, left bronchus or lung: Secondary | ICD-10-CM

## 2024-03-03 NOTE — Progress Notes (Signed)
 The proposed treatment discussed in conference is for discussion purpose only and is not a binding recommendation.  The patients have not been physically examined, or presented with their treatment options.  Therefore, final treatment plans cannot be decided.

## 2024-03-03 NOTE — Progress Notes (Signed)
 Radiation Oncology         [phone] ________________________________  Initial Outpatient Consultation - Conducted via telephone at patient request.  I spoke with the patient to conduct this consult visit via telephone. The patient was notified in advance and was offered an in person or telemedicine meeting to allow for face to face communication but instead preferred to proceed with a telephone consult.   Name: Roberto NOVINGER [name]        MRN: [id number]  Date of Service: 03/03/2024 DOB: 1941-05-07  RR:Gnyw, Lynwood ORN, MD  Shelah Lamar RAMAN, MD     REFERRING PHYSICIAN: Shelah Lamar RAMAN, MD   DIAGNOSIS: The encounter diagnosis was Malignant neoplasm of upper lobe of left lung (HCC).   HISTORY OF PRESENT ILLNESS: Roberto Schor. is a 83 y.o. male seen at the request of Dr. Shelah for a new diagnosis of lung cancer.  The patient was being followed through the lung cancer screening clinic at the [facility] and was found to have a nodule in the left upper lobe, this prompted additional workup with a PET scan which was performed in our system on 01/21/2024 and confirmed a nodule in the lateral aspect of the left upper lobe measuring approximately 17 Wallace new from 2024 with an SUV of 6.9.  There was a small focus of asymmetric uptake in the left hilum with an SUV of 3.7 but felt to be nonspecific there was also minimal uptake as well on the left hilum.  In addition to that there was also concern for a area of uptake in the inferior aspect of the left kidney with an SUV of 13.7, it was concerning for a renal carcinoma.  An enlarged prostate was noted as well as a 4.1 cm abdominal aortic aneurysm.  He underwent bronchoscopy with Dr. Shelah on 02/16/2024 which showed adenocarcinoma in the left upper lobe brushing and fine-needle aspirate and biopsy, and the station 7 lymph node was negative for disease as well as the 4L lymph node station.  Given these findings he has been seen to consider stereotactic body  radiation as he is not felt to be a medically fit candidate for surgery.  He is also working with medical oncology at the [facility] and they are aware of his renal finding.  He is seen today to consider stereotactic body radiation to the left upper lobe.    PREVIOUS RADIATION THERAPY: No   PAST MEDICAL HISTORY:  Past Medical History:  Diagnosis Date   Aneurysm (HCC)    per pt,in head   Arthritis    Chicken pox    Depression    Hypercholesteremia    Hypertension    Migraine    PTSD (post-traumatic stress disorder) 06/24/2017   Stroke (HCC)    No residual effects   Syphilis    Tuberculosis    Vitamin D  deficiency        PAST SURGICAL HISTORY: Past Surgical History:  Procedure Laterality Date   BUBBLE STUDY  08/15/2021   Procedure: BUBBLE STUDY;  Surgeon: Jeffrie Oneil BROCKS, MD;  Location: [facility] ENDOSCOPY;  Service: Cardiovascular;;   COLONOSCOPY     ENDOBRONCHIAL ULTRASOUND Left 02/16/2024   Procedure: ENDOBRONCHIAL ULTRASOUND (EBUS);  Surgeon: Shelah Lamar RAMAN, MD;  Location: [facility] ENDOSCOPY;  Service: Pulmonary;  Laterality: Left;   HERNIA REPAIR Right    x 2   TEE WITHOUT CARDIOVERSION N/A 08/15/2021   Procedure: TRANSESOPHAGEAL ECHOCARDIOGRAM (TEE);  Surgeon: Jeffrie Oneil BROCKS, MD;  Location: [facility] ENDOSCOPY;  Service: Cardiovascular;  Laterality: N/A;   VIDEO BRONCHOSCOPY WITH ENDOBRONCHIAL NAVIGATION Left 02/16/2024   Procedure: VIDEO BRONCHOSCOPY WITH ENDOBRONCHIAL NAVIGATION;  Surgeon: Shelah Lamar RAMAN, MD;  Location: [facility] ENDOSCOPY;  Service: Pulmonary;  Laterality: Left;     FAMILY HISTORY:  Family History  Problem Relation Age of Onset   Dementia Mother    Healthy Father    Prostate cancer Brother      SOCIAL HISTORY:  reports that he has been smoking cigarettes. He started smoking about 52 years ago. He has a 26.2 pack-year smoking history. He has never used smokeless tobacco. He reports that he does not currently use alcohol after a past usage of about 2.0 - 3.0 standard drinks of  alcohol per week. He reports that he does not use drugs. The patient is widowed and lives in [place].    ALLERGIES: Amlodipine, Brinzolamide-brimonidine, and Pollen extract   MEDICATIONS:  Current Outpatient Medications  Medication Sig Dispense Refill   Apoaequorin (PREVAGEN) 10 MG CAPS Take 1 tablet by mouth daily.     aspirin  EC 81 MG tablet Take 1 tablet (81 mg total) by mouth daily. Swallow whole. 30 tablet 12   atorvastatin  (LIPITOR ) 80 MG tablet Take 1 tablet (80 mg total) by mouth daily. 90 tablet 3   Cholecalciferol (THERA-D 2000) 50 MCG (2000 UT) TABS 1 tab by mouth once daily 90 tablet 99   ezetimibe  (ZETIA ) 10 MG tablet Take 1 tablet (10 mg total) by mouth daily. 90 tablet 3   fexofenadine  (ALLEGRA ) 180 MG tablet 180 mg.     losartan  (COZAAR ) 100 MG tablet Take 1 tablet (100 mg total) by mouth daily. 90 tablet 3   memantine  (NAMENDA ) 5 MG tablet Take 1 tablet (5 mg at night) for 2 weeks, then increase to 1 tablet (5 mg) twice a day 60 tablet 11   metoprolol  succinate (TOPROL  XL) 25 MG 24 hr tablet Take 0.5 tablets (12.5 mg total) by mouth at bedtime. 30 tablet 1   Multiple Vitamins-Minerals (VITA-MIN PO) Take 1 tablet by mouth daily at 6 (six) AM. Beef Root Supplement,Pt takes 1000 mg 1 tablet daily.     Multiple Vitamins-Minerals (VITA-MIN PO) Take 1 tablet by mouth daily at 6 (six) AM. Garlic Supplement ,Pt takes 1000 mg 1 tablet daily.     sertraline  (ZOLOFT ) 25 MG tablet Take 1 tablet (25 mg total) by mouth daily. 90 tablet 3   tamsulosin  (FLOMAX ) 0.4 MG CAPS capsule Take 0.4 mg by mouth at bedtime.     Tiotropium Bromide Monohydrate 1.25 MCG/ACT AERS Take by mouth.     triamcinolone cream (KENALOG) 0.1 % Apply 1 Application topically 2 (two) times daily as needed (dry skin).     vitamin B-12 (CYANOCOBALAMIN ) 100 MCG tablet Take 1 tablet (100 mcg total) by mouth daily. 90 tablet 3   No current facility-administered medications for this encounter.     REVIEW OF SYSTEMS:  On review of systems, the patient reports that he is doing well. He denies any difficulty with   chest pain, but has occasional shortness of breath when it is warm and humid outside as is currently. He denies any hemoptysis since his bronchoscopy but has noted some productive cough with clear mucous. No unintended weight loss has been noted. No other complaints are verbalized.   PHYSICAL EXAM:  Unable to assess due to encounter type   ECOG = 1  0 - Asymptomatic (Fully active, able to carry on all predisease activities without restriction)  1 - Symptomatic but completely ambulatory (Restricted in physically strenuous activity but ambulatory and able to carry out work of a light or sedentary nature. For example, light housework, office work)  2 - Symptomatic, <50% in bed during the day (Ambulatory and capable of all self care but unable to carry out any work activities. Up and about more than 50% of waking hours)  3 - Symptomatic, >50% in bed, but not bedbound (Capable of only limited self-care, confined to bed or chair 50% or more of waking hours)  4 - Bedbound (Completely disabled. Cannot carry on any self-care. Totally confined to bed or chair)  5 - Death   Roberto Wallace, Roberto Wallace, Roberto Wallace, Roberto al. [phone]). Toxicity and response criteria of the [facility]. Am. DOROTHA Bridges. Oncol. 5 (6): 649-55    LABORATORY DATA:  Lab Results  Component Value Date   WBC 5.9 01/14/2024   HGB 12.5 (L) 01/14/2024   HCT 38.4 (L) 01/14/2024   MCV 86.2 01/14/2024   PLT 214.0 01/14/2024   Lab Results  Component Value Date   NA 139 01/14/2024   K 4.3 01/14/2024   CL 102 01/14/2024   CO2 32 01/14/2024   Lab Results  Component Value Date   ALT 8 01/14/2024   AST 10 01/14/2024   ALKPHOS 81 01/14/2024   BILITOT 0.4 01/14/2024      RADIOGRAPHY: CT SUPER D CHEST WO CONTRAST Result Date: 02/20/2024 CLINICAL DATA:  Follow-up lung nodule. * Tracking Code: BO * EXAM: CT CHEST WITHOUT  CONTRAST TECHNIQUE: Multidetector CT imaging of the chest was performed using thin slice collimation for electromagnetic bronchoscopy planning purposes, without intravenous contrast. RADIATION DOSE REDUCTION: This exam was performed according to the departmental dose-optimization program which includes automated exposure control, adjustment of the mA and/or kV according to patient size and/or use of iterative reconstruction technique. COMPARISON:  PET-CT from 01/21/2024 FINDINGS: Cardiovascular: heart size is normal. No pericardial effusion. Aortic atherosclerosis. Coronary artery calcifications. Mediastinum/Nodes: Thyroid  gland, trachea and esophagus appear normal. No enlarged mediastinal lymph nodes. Hilar lymph nodes are suboptimally evaluated due to lack of IV contrast. Lungs/Pleura: There is emphysema with diffuse bronchial wall thickening. Bilateral peripheral and lower lung zone predominant interstitial reticulation is identified. Scattered areas scarring and architectural distortion. Scattered lung nodules are identified. The previous tracer avid lesion within the lateral, subpleural left upper lobe measures 2.1 cm, image 67/5. Previously 1.7 cm. 6 Wallace nodule in the posterior right upper lobe is unchanged from 09/25/2022 and did not demonstrate any tracer uptake on the recent PET-CT. Additional lung nodules within the right upper lobe and anterior left upper lobe are unchanged in the interval. Upper Abdomen: No acute abnormality. Bilateral kidney cysts noted within the imaged portions of the kidneys. Measure up to 2.6 cm and measure fluid attenuation compatible with benign Bosniak class 1 cyst. No follow-up imaging recommended. Multiple small less than 1 cm low-attenuation liver lesions are noted which are technically too small to characterize but appears similar to previous imaging. Aortic atherosclerosis that unchanged left adrenal nodule measuring 3.3 by 1.9 cm not tracer avid on recent PET-CT. This is  been unchanged since 2017 and most likely represents a benign adenoma. No follow-up imaging recommended. Musculoskeletal: Remote left lateral rib fracture deformities. No acute or suspicious osseous abnormality. IMPRESSION: 1. The previous tracer avid lesion within the lateral, subpleural left upper lobe measures 2.1 cm, previously 1.7 cm. This is concerning for primary bronchogenic carcinoma. 2. Additional, non tracer avid  scattered lung nodules are identified which are unchanged in the interval. 3. Emphysema and diffuse bronchial wall thickening. 4. Coronary artery calcifications. 5. Aortic Atherosclerosis (ICD10-I70.0) and Emphysema (ICD10-J43.9). Electronically Signed   By: Waddell Calk M.D.   On: 02/20/2024 07:28   DG Chest Port 1 View Result Date: 02/16/2024 CLINICAL DATA:  Status post bronchoscopy EXAM: PORTABLE CHEST 1 VIEW COMPARISON:  CT of the chest 02/09/2024.  Chest x-ray 07/09/2021. FINDINGS: Patchy focal/nodular opacities in the peripheral left upper lobe appear unchanged. The lungs are otherwise clear. There is no pleural effusion or pneumothorax identified. The cardiomediastinal silhouette is within normal limits. There are multiple healed left-sided rib fractures. IMPRESSION: 1. No pneumothorax identified. 2. Stable patchy focal/nodular opacities in the peripheral left upper lobe. Electronically Signed   By: Greig Pique M.D.   On: 02/16/2024 16:24   DG C-ARM BRONCHOSCOPY Result Date: 02/16/2024 C-ARM BRONCHOSCOPY: Fluoroscopy was utilized by the requesting physician.  No radiographic interpretation.       IMPRESSION/PLAN: 1. Stage IA2, cT1bN0M0, NSCLC, adenocarcinoma of the LUL. Dr. Dewey discusses the pathology findings and reviews the nature of early stage lung cancer. Dr. Dewey reviews that the standard of care is for surgical resection. However for patients who are not medical candidates to undergo surgery, or who choose to forgo surgery, stereotactic body radiotherapy (SBRT) is  an appropriate alternative.  We discussed the risks, benefits, short, and long term effects of radiotherapy, as well as the curative intent, and the patient is interested in proceeding with SBRT. We discussed the delivery and logistics of radiotherapy and Dr. Smitty recommendations for 3-5 fractions of treatment to the LUL. Written consent  will be obtained tomorrow when the patient comes for simulation. 2. Left Renal Hypermetabolism. [facility]. I've communicated with Greig Ryder, PAC at the [facility] and Alfonso, nurse navigator in oncology as well. The patient was made aware that he would receive a call from the [facility] to schedule his CT scan in the near future.    This encounter was conducted via telephone.  The patient has provided two factor identification and has given verbal consent for this type of encounter and has been advised to only accept a meeting of this type in a secure network environment. The time spent during this encounter was 60 minutes including preparation, discussion, and coordination of the patient's care. The attendants for this meeting include Roberto Cary, Roberto Wallace, Roberto Wallace  and Roberto MALVA Metro [name].  During the encounter,  Roberto Cary, Roberto Wallace, and Roberto Wallace were located at [facility] Radiation Oncology Department.  Roberto MALVA Metro [name] was located at home.   Roberto Wallace, [facility]   **Disclaimer: This note was dictated with voice recognition software. Similar sounding words can inadvertently be transcribed and this note may contain transcription errors which may not have been corrected upon publication of note.**

## 2024-03-04 ENCOUNTER — Ambulatory Visit
Admission: RE | Admit: 2024-03-04 | Discharge: 2024-03-04 | Disposition: A | Discharge status: Home / Self Care | Source: Ambulatory Visit | Attending: Radiation Oncology | Admitting: Radiation Oncology

## 2024-03-04 DIAGNOSIS — C3412 Malignant neoplasm of upper lobe, left bronchus or lung: Secondary | ICD-10-CM | POA: Insufficient documentation

## 2024-03-04 DIAGNOSIS — Z510 Encounter for antineoplastic radiation therapy: Secondary | ICD-10-CM | POA: Insufficient documentation

## 2024-03-07 ENCOUNTER — Telehealth: Payer: Self-pay | Admitting: Radiation Oncology

## 2024-03-07 ENCOUNTER — Ambulatory Visit (INDEPENDENT_AMBULATORY_CARE_PROVIDER_SITE_OTHER): Admitting: Internal Medicine

## 2024-03-07 ENCOUNTER — Encounter: Payer: Self-pay | Admitting: Internal Medicine

## 2024-03-07 ENCOUNTER — Ambulatory Visit: Payer: Self-pay | Admitting: Internal Medicine

## 2024-03-07 VITALS — BP 122/68 | HR 95 | Temp 97.7°F | Ht 68.9 in

## 2024-03-07 DIAGNOSIS — M51369 Other intervertebral disc degeneration, lumbar region without mention of lumbar back pain or lower extremity pain: Secondary | ICD-10-CM | POA: Insufficient documentation

## 2024-03-07 DIAGNOSIS — R350 Frequency of micturition: Secondary | ICD-10-CM | POA: Insufficient documentation

## 2024-03-07 DIAGNOSIS — M79604 Pain in right leg: Secondary | ICD-10-CM | POA: Insufficient documentation

## 2024-03-07 DIAGNOSIS — R739 Hyperglycemia, unspecified: Secondary | ICD-10-CM

## 2024-03-07 DIAGNOSIS — E559 Vitamin D deficiency, unspecified: Secondary | ICD-10-CM

## 2024-03-07 LAB — URINALYSIS, ROUTINE W REFLEX MICROSCOPIC
Bilirubin Urine: NEGATIVE
Hgb urine dipstick: NEGATIVE
Ketones, ur: NEGATIVE
Leukocytes,Ua: NEGATIVE
Nitrite: NEGATIVE
RBC / HPF: NONE SEEN (ref 0–?)
Specific Gravity, Urine: 1.02 (ref 1.000–1.030)
Total Protein, Urine: NEGATIVE
Urine Glucose: NEGATIVE
Urobilinogen, UA: 0.2 (ref 0.0–1.0)
WBC, UA: NONE SEEN (ref 0–?)
pH: 6 (ref 5.0–8.0)

## 2024-03-07 NOTE — Telephone Encounter (Signed)
 Chanel from the [facility] called in on behlf of pt to check if appts were scheduled for tx. I informed Chanel of Yunis's next tx appt on 7/28.

## 2024-03-07 NOTE — Assessment & Plan Note (Addendum)
 Etiology unclear, but supsect msk strain now improved,  to f/u any worsening symptoms or concerns, ok for tylenol  prn

## 2024-03-07 NOTE — Patient Instructions (Signed)
 Please continue all other medications as before, and refills have been done if requested.  Please have the pharmacy call with any other refills you may need.  Please keep your appointments with your specialists as you may have planned - MRI for the brain, and Radiation Therapy soon  Please go to the LAB at the blood drawing area for the tests to be done - just the urine testing today  You will be contacted by phone if any changes need to be made immediately.  Otherwise, you will receive a letter about your results with an explanation, but please check with MyChart first.  Please make an Appointment to return in 6 months, or sooner if needed

## 2024-03-07 NOTE — Assessment & Plan Note (Signed)
 Mild, For ua and cx r/o infection, consider urology for worsening

## 2024-03-07 NOTE — Assessment & Plan Note (Signed)
 Lab Results  Component Value Date   HGBA1C 5.7 01/14/2024   Stable, pt to continue current medical treatment  - diet, wt control

## 2024-03-07 NOTE — Assessment & Plan Note (Signed)
 Last vitamin D  Lab Results  Component Value Date   VD25OH 41.25 01/14/2024   Stable, cont oral replacement

## 2024-03-07 NOTE — Progress Notes (Signed)
 Patient ID: Roberto Wallace., male   DOB: 1941/08/02, 83 y.o.   MRN: [id number]        Chief Complaint: follow up urinary frequency with nocturia, right medial upper leg pain, left HA and recent lung Ca diagnosis, low vit d       HPI:  [name]. is a 83 y.o. male here with recent dx lung ca for XRT soon, also with left HA and willing to do the MRI brain ordered. Denies urinary symptoms such as dysuria, urgency, flank pain, hematuria or n/v, fever, chills with frequency and nocturia for several months.  Pt denies chest pain, increased sob or doe, wheezing, orthopnea, PND, increased LE swelling, palpitations, dizziness or syncope.   Pt denies polydipsia, polyuria, or new focal neuro s/s.   Also with pain yesterday for several hours to right upper leg medial aspect but no redness or swelling or trauma.         Wt Readings from Last 3 Encounters:  03/03/24 112 lb 8 oz (51 kg)  02/22/24 112 lb (50.8 kg)  02/16/24 112 lb (50.8 kg)   BP Readings from Last 3 Encounters:  03/07/24 122/68  02/22/24 128/79  02/17/24 (!) 117/96         Past Medical History:  Diagnosis Date   Aneurysm (HCC)    per pt,in head   Arthritis    Chicken pox    Depression    Hypercholesteremia    Hypertension    Migraine    PTSD (post-traumatic stress disorder) 06/24/2017   Stroke (HCC)    No residual effects   Syphilis    Tuberculosis    Vitamin D  deficiency    Past Surgical History:  Procedure Laterality Date   BUBBLE STUDY  08/15/2021   Procedure: BUBBLE STUDY;  Surgeon: Jeffrie Oneil BROCKS, MD;  Location: [facility] ENDOSCOPY;  Service: Cardiovascular;;   COLONOSCOPY     ENDOBRONCHIAL ULTRASOUND Left 02/16/2024   Procedure: ENDOBRONCHIAL ULTRASOUND (EBUS);  Surgeon: Shelah Lamar RAMAN, MD;  Location: [facility] ENDOSCOPY;  Service: Pulmonary;  Laterality: Left;   HERNIA REPAIR Right    x 2   TEE WITHOUT CARDIOVERSION N/A 08/15/2021   Procedure: TRANSESOPHAGEAL ECHOCARDIOGRAM (TEE);  Surgeon: Jeffrie Oneil BROCKS, MD;   Location: [facility] ENDOSCOPY;  Service: Cardiovascular;  Laterality: N/A;   VIDEO BRONCHOSCOPY WITH ENDOBRONCHIAL NAVIGATION Left 02/16/2024   Procedure: VIDEO BRONCHOSCOPY WITH ENDOBRONCHIAL NAVIGATION;  Surgeon: Shelah Lamar RAMAN, MD;  Location: [facility] ENDOSCOPY;  Service: Pulmonary;  Laterality: Left;    reports that he has been smoking cigarettes. He started smoking about 52 years ago. He has a 26.2 pack-year smoking history. He has never used smokeless tobacco. He reports that he does not currently use alcohol after a past usage of about 2.0 - 3.0 standard drinks of alcohol per week. He reports that he does not use drugs. family history includes Dementia in his mother; Healthy in his father; Prostate cancer in his brother. Allergies  Allergen Reactions   Amlodipine Rash   Brinzolamide-Brimonidine Other (See Comments)   Pollen Extract Other (See Comments)    Other reaction(s): Sinusitis   Current Outpatient Medications on File Prior to Visit  Medication Sig Dispense Refill   Apoaequorin (PREVAGEN) 10 MG CAPS Take 1 tablet by mouth daily.     aspirin  EC 81 MG tablet Take 1 tablet (81 mg total) by mouth daily. Swallow whole. 30 tablet 12   atorvastatin  (LIPITOR ) 80 MG tablet Take 1 tablet (80 mg total) by mouth  daily. 90 tablet 3   Cholecalciferol (THERA-D 2000) 50 MCG (2000 UT) TABS 1 tab by mouth once daily 90 tablet 99   ezetimibe  (ZETIA ) 10 MG tablet Take 1 tablet (10 mg total) by mouth daily. 90 tablet 3   fexofenadine  (ALLEGRA ) 180 MG tablet 180 mg.     losartan  (COZAAR ) 100 MG tablet Take 1 tablet (100 mg total) by mouth daily. 90 tablet 3   memantine  (NAMENDA ) 5 MG tablet Take 1 tablet (5 mg at night) for 2 weeks, then increase to 1 tablet (5 mg) twice a day 60 tablet 11   metoprolol  succinate (TOPROL  XL) 25 MG 24 hr tablet Take 0.5 tablets (12.5 mg total) by mouth at bedtime. 30 tablet 1   Multiple Vitamins-Minerals (VITA-MIN PO) Take 1 tablet by mouth daily at 6 (six) AM. Beef Root  Supplement,Pt takes 1000 mg 1 tablet daily.     Multiple Vitamins-Minerals (VITA-MIN PO) Take 1 tablet by mouth daily at 6 (six) AM. Garlic Supplement ,Pt takes 1000 mg 1 tablet daily.     sertraline  (ZOLOFT ) 25 MG tablet Take 1 tablet (25 mg total) by mouth daily. 90 tablet 3   tamsulosin  (FLOMAX ) 0.4 MG CAPS capsule Take 0.4 mg by mouth at bedtime.     Tiotropium Bromide Monohydrate 1.25 MCG/ACT AERS Take by mouth.     triamcinolone cream (KENALOG) 0.1 % Apply 1 Application topically 2 (two) times daily as needed (dry skin).     vitamin B-12 (CYANOCOBALAMIN ) 100 MCG tablet Take 1 tablet (100 mcg total) by mouth daily. 90 tablet 3   No current facility-administered medications on file prior to visit.        ROS:  All others reviewed and negative.  Objective        PE:  BP 122/68   Pulse 95   Temp 97.7 F (36.5 C)   Ht 5' 8.9 (1.75 m)   SpO2 99%   BMI 16.66 kg/m                 Constitutional: Pt appears in NAD but thin               HENT: Head: NCAT.                Right Ear: External ear normal.                 Left Ear: External ear normal.                Eyes: . Pupils are equal, round, and reactive to light. Conjunctivae and EOM are normal               Nose: without d/c or deformity               Neck: Neck supple. Gross normal ROM               Cardiovascular: Normal rate and regular rhythm.                 Pulmonary/Chest: Effort normal and breath sounds without rales or wheezing.                Abd:  Soft, NT, ND, + BS, no organomegaly               Neurological: Pt is alert. At baseline orientation, motor grossly intact               Skin: Skin is warm. No rashes,  no other new lesions, LE edema - none; right medial thigh without tender or swelling               Psychiatric: Pt behavior is normal without agitation   Micro: none  Cardiac tracings I have personally interpreted today:  none  Pertinent Radiological findings (summarize): none   Lab Results  Component  Value Date   WBC 5.9 01/14/2024   HGB 12.5 (L) 01/14/2024   HCT 38.4 (L) 01/14/2024   PLT 214.0 01/14/2024   GLUCOSE 114 (H) 01/14/2024   CHOL 162 01/14/2024   TRIG 115.0 01/14/2024   HDL 42.50 01/14/2024   LDLCALC 96 01/14/2024   ALT 8 01/14/2024   AST 10 01/14/2024   NA 139 01/14/2024   K 4.3 01/14/2024   CL 102 01/14/2024   CREATININE 1.17 01/14/2024   BUN 14 01/14/2024   CO2 32 01/14/2024   TSH 0.62 01/14/2024   PSA 2.77 12/12/2020   INR 0.99 10/16/2017   HGBA1C 5.7 01/14/2024   Assessment/Plan:  AUDEL COAKLEY [name] is a 83 y.o. Black or African American [2] male with  has a past medical history of Aneurysm (HCC), Arthritis, Chicken pox, Depression, Hypercholesteremia, Hypertension, Migraine, PTSD (post-traumatic stress disorder) (06/24/2017), Stroke (HCC), Syphilis, Tuberculosis, and Vitamin D  deficiency.  Right leg pain Etiology unclear, but supsect msk strain now improved,  to f/u any worsening symptoms or concerns, ok for tylenol  prn  Urinary frequency Mild, For ua and cx r/o infection, consider urology for worsening  Vitamin D  deficiency Last vitamin D  Lab Results  Component Value Date   VD25OH 41.25 01/14/2024   Stable, cont oral replacement   Hyperglycemia Lab Results  Component Value Date   HGBA1C 5.7 01/14/2024   Stable, pt to continue current medical treatment  - diet,wt control  Followup: Return in about 6 months (around 09/07/2024).  Lynwood Rush, MD 03/07/2024 9:17 PM  [facility] Medicine

## 2024-03-08 ENCOUNTER — Other Ambulatory Visit: Payer: Self-pay | Admitting: Nurse Practitioner

## 2024-03-08 LAB — URINE CULTURE: Result:: NO GROWTH

## 2024-03-16 ENCOUNTER — Telehealth: Payer: Self-pay | Admitting: Internal Medicine

## 2024-03-16 DIAGNOSIS — C3412 Malignant neoplasm of upper lobe, left bronchus or lung: Secondary | ICD-10-CM | POA: Diagnosis not present

## 2024-03-16 DIAGNOSIS — Z510 Encounter for antineoplastic radiation therapy: Secondary | ICD-10-CM | POA: Diagnosis not present

## 2024-03-16 NOTE — Telephone Encounter (Signed)
 Patient dropped off document [employer] form, to be filled out by provider. Patient requested to send it back via Mail within 7-days. Document is located in providers tray at front office.Please advise at [facility] [phone]   To be mailed to [address] [place], [place] [address].

## 2024-03-21 ENCOUNTER — Ambulatory Visit
Admission: RE | Admit: 2024-03-21 | Discharge: 2024-03-21 | Disposition: A | Discharge status: Home / Self Care | Source: Ambulatory Visit | Attending: Radiation Oncology | Admitting: Radiation Oncology

## 2024-03-21 ENCOUNTER — Other Ambulatory Visit: Payer: Self-pay

## 2024-03-21 DIAGNOSIS — Z510 Encounter for antineoplastic radiation therapy: Secondary | ICD-10-CM | POA: Diagnosis not present

## 2024-03-21 DIAGNOSIS — C3412 Malignant neoplasm of upper lobe, left bronchus or lung: Secondary | ICD-10-CM | POA: Diagnosis not present

## 2024-03-21 LAB — RAD ONC ARIA SESSION SUMMARY
Course Elapsed Days: 0
Plan Fractions Treated to Date: 1
Plan Prescribed Dose Per Fraction: 12 Gy
Plan Total Fractions Prescribed: 5
Plan Total Prescribed Dose: 60 Gy
Reference Point Dosage Given to Date: 12 Gy
Reference Point Session Dosage Given: 12 Gy
Session Number: 1

## 2024-03-21 NOTE — Telephone Encounter (Signed)
 Form has been placed in our receptacle to be mailed off

## 2024-03-21 NOTE — Telephone Encounter (Unsigned)
 Copied from CRM [id number]. Topic: General - Other >> Mar 21, 2024 11:48 AM Emylou G wrote: Reason for CRM: Patient will come by and sign before mailing off.  ( Sometime today )

## 2024-03-21 NOTE — Telephone Encounter (Signed)
 Attempted to call patient but was not able to leave a voice message. Wanted to inform patient that form had been received and signed by Dr.John. All sections have been filled except the first page which is requiring a signature from the patient. Would like to know if he would still like this mailed off or if he would like to stop by to sign

## 2024-03-22 ENCOUNTER — Ambulatory Visit: Admitting: Radiation Oncology

## 2024-03-23 ENCOUNTER — Ambulatory Visit
Admission: RE | Admit: 2024-03-23 | Discharge: 2024-03-23 | Disposition: A | Discharge status: Home / Self Care | Source: Ambulatory Visit | Attending: Radiation Oncology | Admitting: Radiation Oncology

## 2024-03-23 ENCOUNTER — Other Ambulatory Visit: Payer: Self-pay | Admitting: Physician Assistant

## 2024-03-23 ENCOUNTER — Other Ambulatory Visit: Payer: Self-pay

## 2024-03-23 DIAGNOSIS — Z510 Encounter for antineoplastic radiation therapy: Secondary | ICD-10-CM | POA: Diagnosis not present

## 2024-03-23 DIAGNOSIS — C3412 Malignant neoplasm of upper lobe, left bronchus or lung: Secondary | ICD-10-CM | POA: Diagnosis not present

## 2024-03-23 LAB — RAD ONC ARIA SESSION SUMMARY
Course Elapsed Days: 2
Plan Fractions Treated to Date: 2
Plan Prescribed Dose Per Fraction: 12 Gy
Plan Total Fractions Prescribed: 5
Plan Total Prescribed Dose: 60 Gy
Reference Point Dosage Given to Date: 24 Gy
Reference Point Session Dosage Given: 12 Gy
Session Number: 2

## 2024-03-24 ENCOUNTER — Ambulatory Visit: Admitting: Radiation Oncology

## 2024-03-25 ENCOUNTER — Ambulatory Visit
Admission: RE | Admit: 2024-03-25 | Discharge: 2024-03-25 | Disposition: A | Discharge status: Home / Self Care | Source: Ambulatory Visit | Attending: Radiation Oncology | Admitting: Radiation Oncology

## 2024-03-25 ENCOUNTER — Other Ambulatory Visit: Payer: Self-pay

## 2024-03-25 DIAGNOSIS — C3412 Malignant neoplasm of upper lobe, left bronchus or lung: Secondary | ICD-10-CM | POA: Diagnosis not present

## 2024-03-25 DIAGNOSIS — Z510 Encounter for antineoplastic radiation therapy: Secondary | ICD-10-CM | POA: Diagnosis not present

## 2024-03-25 LAB — RAD ONC ARIA SESSION SUMMARY
Course Elapsed Days: 4
Plan Fractions Treated to Date: 3
Plan Prescribed Dose Per Fraction: 12 Gy
Plan Total Fractions Prescribed: 5
Plan Total Prescribed Dose: 60 Gy
Reference Point Dosage Given to Date: 36 Gy
Reference Point Session Dosage Given: 12 Gy
Session Number: 3

## 2024-03-29 ENCOUNTER — Other Ambulatory Visit: Payer: Self-pay

## 2024-03-29 ENCOUNTER — Ambulatory Visit
Admission: RE | Admit: 2024-03-29 | Discharge: 2024-03-29 | Disposition: A | Discharge status: Home / Self Care | Source: Ambulatory Visit | Attending: Radiation Oncology | Admitting: Radiation Oncology

## 2024-03-29 DIAGNOSIS — C3412 Malignant neoplasm of upper lobe, left bronchus or lung: Secondary | ICD-10-CM | POA: Diagnosis not present

## 2024-03-29 DIAGNOSIS — Z510 Encounter for antineoplastic radiation therapy: Secondary | ICD-10-CM | POA: Diagnosis not present

## 2024-03-29 LAB — RAD ONC ARIA SESSION SUMMARY
Course Elapsed Days: 8
Plan Fractions Treated to Date: 4
Plan Prescribed Dose Per Fraction: 12 Gy
Plan Total Fractions Prescribed: 5
Plan Total Prescribed Dose: 60 Gy
Reference Point Dosage Given to Date: 48 Gy
Reference Point Session Dosage Given: 12 Gy
Session Number: 4

## 2024-03-31 ENCOUNTER — Ambulatory Visit
Admission: RE | Admit: 2024-03-31 | Discharge: 2024-03-31 | Disposition: A | Discharge status: Home / Self Care | Source: Ambulatory Visit | Attending: Radiation Oncology

## 2024-03-31 ENCOUNTER — Ambulatory Visit
Admission: RE | Admit: 2024-03-31 | Discharge: 2024-03-31 | Disposition: A | Discharge status: Home / Self Care | Source: Ambulatory Visit | Attending: Radiation Oncology | Admitting: Radiation Oncology

## 2024-03-31 ENCOUNTER — Other Ambulatory Visit: Payer: Self-pay

## 2024-03-31 DIAGNOSIS — C3412 Malignant neoplasm of upper lobe, left bronchus or lung: Secondary | ICD-10-CM | POA: Diagnosis not present

## 2024-03-31 DIAGNOSIS — Z510 Encounter for antineoplastic radiation therapy: Secondary | ICD-10-CM | POA: Diagnosis not present

## 2024-03-31 LAB — RAD ONC ARIA SESSION SUMMARY
Course Elapsed Days: 10
Plan Fractions Treated to Date: 5
Plan Prescribed Dose Per Fraction: 12 Gy
Plan Total Fractions Prescribed: 5
Plan Total Prescribed Dose: 60 Gy
Reference Point Dosage Given to Date: 60 Gy
Reference Point Session Dosage Given: 12 Gy
Session Number: 5

## 2024-04-01 NOTE — Radiation Completion Notes (Addendum)
  Radiation Oncology         [phone]) [phone] ________________________________  Name: Roberto MALVA Metro [name] MRN: [id number]  Date of Service: 03/31/2024  DOB: November 16, 1940  End of Treatment Note  Diagnosis: Stage IA2, cT1bN0M0, NSCLC, adenocarcinoma of the LUL   Intent: Curative     ==========DELIVERED PLANS==========  First Treatment Date: 2024-03-21 Last Treatment Date: 2024-03-31   Plan Name: Lung_L_SBRT Site: Lung, Left Technique: SBRT/SRT-IMRT Mode: Photon Dose Per Fraction: 12 Gy Prescribed Dose (Delivered / Prescribed): 60 Gy / 60 Gy Prescribed Fxs (Delivered / Prescribed): 5 / 5     ==========ON TREATMENT VISIT DATES========== 2024-03-21, 2024-03-23, 2024-03-25, 2024-03-29, 2024-03-31, 2024-03-31    See weekly On Treatment Notes in Epic for details in the Media tab (listed as Progress notes on the On Treatment Visit Dates listed above). The patient tolerated radiation. He developed fatigue and anticipated skin changes in the treatment field.   The patient will receive a call in about one month from the radiation oncology department. He will continue follow up with Greig Ryder, [facility] in the lung cancer clinic at the [facility] as well.       [name], PAC

## 2024-04-05 ENCOUNTER — Telehealth: Payer: Self-pay

## 2024-04-05 NOTE — Telephone Encounter (Signed)
 The patient came by and dropped off forms for [employer] requesting it be sent back by 04/22/2024. Forms placed in Dr. Nicola box. Patient contact number [phone]

## 2024-04-07 NOTE — Telephone Encounter (Signed)
 Form has been received, filled out to the best of my ability, and handed back to PCP for signature. Will contact patient once finished as I will need his signature before faxing off

## 2024-05-02 ENCOUNTER — Ambulatory Visit
Admission: RE | Admit: 2024-05-02 | Discharge: 2024-05-02 | Disposition: A | Discharge status: Home / Self Care | Source: Ambulatory Visit | Attending: Radiation Oncology | Admitting: Radiation Oncology

## 2024-05-02 ENCOUNTER — Telehealth: Payer: Self-pay | Admitting: *Deleted

## 2024-05-02 DIAGNOSIS — C3412 Malignant neoplasm of upper lobe, left bronchus or lung: Secondary | ICD-10-CM

## 2024-05-02 NOTE — Telephone Encounter (Signed)
 Called patient to inform of CT for 05-17-24- arrival time- 1:30 pm @ [facility], no restrictions to scan, patient to receive results from Donald Husband on 05-30-24 @ 3 pm via telephone, spoke with patient and he is aware of these appts. and the instructions

## 2024-05-03 NOTE — Progress Notes (Signed)
  Radiation Oncology         [phone]) [phone] ________________________________  Name: Roberto MALVA Metro [name] MRN: [id number]  Date of Service: 05/02/2024  DOB: [birth year]  Post Treatment Telephone Note  Diagnosis: Stage IA2, cT1bN0M0, NSCLC, adenocarcinoma of the LUL    The patient was available for call today.   Symptoms of fatigue have improved since completing therapy.  Symptoms of skin changes have improved since completing therapy.  Symptoms of esophagitis have improved since completing therapy.   The patient has scheduled follow up with his radiation oncologist Dr. Dewey and Donald Husband PA for ongoing care, and was encouraged to call if he  develops concerns or questions regarding radiation.  We discussed upcoming CT Chest 05/17/2024 and he was informed we would call him with those results 05/30/2024.

## 2024-05-13 ENCOUNTER — Encounter: Payer: Self-pay | Admitting: Pharmacist

## 2024-05-13 NOTE — Progress Notes (Signed)
 Pharmacy Quality Measure Review  This patient is appearing on a report for being at risk of failing the adherence measure for cholesterol (statin) medications this calendar year.   Medication: Atorvastatin  80 mg Last fill date: 04/08/24 for 90 day supply  Insurance report was not up to date. No action needed at this time.   Darrelyn Drum, PharmD, BCPS, CPP Clinical Pharmacist Practitioner [facility] at [facility] [phone]

## 2024-05-16 ENCOUNTER — Other Ambulatory Visit (HOSPITAL_COMMUNITY)

## 2024-05-17 ENCOUNTER — Ambulatory Visit (HOSPITAL_COMMUNITY)
Admission: RE | Admit: 2024-05-17 | Discharge: 2024-05-17 | Disposition: A | Discharge status: Home / Self Care | Source: Ambulatory Visit | Attending: Radiation Oncology | Admitting: Radiation Oncology

## 2024-05-17 DIAGNOSIS — C3412 Malignant neoplasm of upper lobe, left bronchus or lung: Secondary | ICD-10-CM | POA: Insufficient documentation

## 2024-05-17 MED ORDER — IOHEXOL 300 MG/ML  SOLN
75.0000 mL | Freq: Once | INTRAMUSCULAR | Status: AC | PRN
  Administered 2024-05-17: 75 mL via INTRAVENOUS

## 2024-05-23 ENCOUNTER — Encounter: Payer: Self-pay | Admitting: Internal Medicine

## 2024-05-23 NOTE — Progress Notes (Signed)
 Pharmacy Quality Measure Review  This patient is appearing on a report for being at risk of failing the adherence measure for cholesterol (statin) medications this calendar year.   Medication: Atorvastatin  80mg  Last fill date: 08/15 for 90 day supply  Insurance report was not up to date. No action needed at this time.    Angela Baalmann, PharmD [facility] Pharmacist

## 2024-05-30 ENCOUNTER — Encounter: Payer: Self-pay | Admitting: Emergency Medicine

## 2024-05-30 ENCOUNTER — Ambulatory Visit
Admission: RE | Admit: 2024-05-30 | Discharge: 2024-05-30 | Disposition: A | Discharge status: Home / Self Care | Payer: Self-pay | Source: Ambulatory Visit | Attending: Radiation Oncology | Admitting: Radiation Oncology

## 2024-05-30 DIAGNOSIS — C3412 Malignant neoplasm of upper lobe, left bronchus or lung: Secondary | ICD-10-CM

## 2024-05-30 NOTE — Progress Notes (Signed)
 Follow up call to inform patient of chest CT results. He sees Lauraine Lites, NP at [facility] and has an appointment this Thursday 06/02/24 at the [facility] in [place] with his PCP. Patient states no pain or trouble breathing at this time.  Recent SBRT first baseline scan. Malignant neoplasm of upper lobe of left lung. * Tracking Code: BO *   IMPRESSION: 1. There is a new solid noncalcified 6 x 8 mm nodule in the subpleural left lower lobe, which is highly concerning for metastasis. 2. There is also significant interval increase in the size of mediastinal and left hilar lymph nodes, compatible with worsening metastatic disease. 3. Multiple other nonacute observations, as described above.   Aortic Atherosclerosis (ICD10-I70.0) and Emphysema (ICD10-J43.9).

## 2024-05-31 ENCOUNTER — Telehealth: Payer: Self-pay | Admitting: Emergency Medicine

## 2024-05-31 ENCOUNTER — Other Ambulatory Visit: Payer: Self-pay | Admitting: Radiation Oncology

## 2024-05-31 DIAGNOSIS — C3490 Malignant neoplasm of unspecified part of unspecified bronchus or lung: Secondary | ICD-10-CM

## 2024-05-31 DIAGNOSIS — R590 Localized enlarged lymph nodes: Secondary | ICD-10-CM | POA: Insufficient documentation

## 2024-05-31 DIAGNOSIS — R911 Solitary pulmonary nodule: Secondary | ICD-10-CM | POA: Insufficient documentation

## 2024-05-31 NOTE — Telephone Encounter (Signed)
 Please schedule the following:  Provider performing procedure: Keaten Mashek Diagnosis: Left lower lobe nodule, mediastinal adenopathy Which side if for nodule / mass?  Left Procedure: Robotic assisted navigation, EBUS Has patient been spoken to by Provider and given informed consent?  Yes Anesthesia: General Do you need Fluro?  Yes Duration of procedure: 60 minutes Date: 06/07/2024 Alternate Date:   Time:  Location: [facility] Does patient have OSA?  No DM?  No or Latex allergy?  No Medication Restriction/ Anticoagulate/Antiplatelet: Stop aspirin  2 days prior Pre-op Labs Ordered:determined by Anesthesia Imaging request: CT chest available in PACS  (If, SuperDimension CT Chest, please have STAT courier sent to ENDO)

## 2024-05-31 NOTE — Progress Notes (Signed)
 Radiation Oncology         [phone] ________________________________   Outpatient Follow Up - Conducted via telephone at patient request.  I spoke with the patient to conduct this visit via telephone. The patient was notified in advance and was offered an in person or telemedicine meeting to allow for face to face communication but instead preferred to proceed with a telephone visit.   Name: Roberto COAXUM [name]        MRN: [id number]  Date of Service: 05/30/2024 DOB: May 18, 1941  RR:Gnyw, Lynwood ORN, MD  Shelah Lamar RAMAN, MD     REFERRING PHYSICIAN: Shelah Lamar RAMAN, MD   DIAGNOSIS: The encounter diagnosis was Malignant neoplasm of upper lobe of left lung (HCC).   HISTORY OF PRESENT ILLNESS: Roberto Siwek. is a 83 y.o. male with a recent diagnosis of lung cancer.  The patient was being followed through the lung cancer screening clinic at the [facility] and was found to have a nodule in the left upper lobe, this prompted additional workup with a PET scan which was performed in our system on 01/21/2024 and confirmed a nodule in the lateral aspect of the left upper lobe measuring approximately 17 mm new from 2024 with an SUV of 6.9.  There was a small focus of asymmetric uptake in the left hilum with an SUV of 3.7 but felt to be nonspecific there was also minimal uptake as well on the left hilum.  In addition to that there was also concern for a area of uptake in the inferior aspect of the left kidney with an SUV of 13.7, it was concerning for a renal carcinoma.  An enlarged prostate was noted as well as a 4.1 cm abdominal aortic aneurysm.  He underwent bronchoscopy with Dr. Shelah on 02/16/2024 which showed adenocarcinoma in the left upper lobe brushing and fine-needle aspirate and biopsy, and the station 7 lymph node was negative for disease as well as the 4L lymph node station.  Given these findings he has been seen to consider stereotactic body radiation as he is not felt to be a medically fit  candidate for surgery.  He is also working with medical oncology at the [facility] and they are aware of his renal finding.  He completed SBRT to the LUL which he completed in August 2025 without difficulty.   He presented for post treatment CT on 05/17/24. This showed improvement in the LUL nodule, measuring 13 mm in greatest dimension, and persistent multiple calcified nodules. There was a new nodule in the subpleural LLL measuring 8 mm which was new in comparison to his prior imaging. In addition there was progressive enlargement in the precarinal node measuring 2.2 x 2.6 cm, and in the left hilum measuring 1.6 x 2.6 cm as well as other enlarged nodes. Bilateral renal cysts were noted and a stable left adrenal nodule was noted.  I've spoken with Dr. Shelah and he will offer repeat bronchoscopy with EBUS to try to navigate to the nodes, and try to also sample the new LLL nodule. I've also left a message for the medical oncology navigator at the [facility] to alert his team. He's contacted by phone to discuss this.    PREVIOUS RADIATION THERAPY:   03/21/24-03/31/24 SBRT Treatment Plan Name: Lung_L_SBRT Site: Lung, LUL Technique: SBRT/SRT-IMRT Mode: Photon Dose Per Fraction: 12 Gy Prescribed Dose (Delivered / Prescribed): 60 Gy / 60 Gy Prescribed Fxs (Delivered / Prescribed): 5 / 5  PAST MEDICAL HISTORY:  Past Medical  History:  Diagnosis Date   Aneurysm    per pt,in head   Arthritis    Chicken pox    Depression    Hypercholesteremia    Hypertension    Migraine    PTSD (post-traumatic stress disorder) 06/24/2017   Stroke (HCC)    No residual effects   Syphilis    Tuberculosis    Vitamin D  deficiency        PAST SURGICAL HISTORY: Past Surgical History:  Procedure Laterality Date   BUBBLE STUDY  08/15/2021   Procedure: BUBBLE STUDY;  Surgeon: Jeffrie Oneil BROCKS, MD;  Location: [facility] ENDOSCOPY;  Service: Cardiovascular;;   COLONOSCOPY     ENDOBRONCHIAL ULTRASOUND Left 02/16/2024   Procedure:  ENDOBRONCHIAL ULTRASOUND (EBUS);  Surgeon: Shelah Lamar RAMAN, MD;  Location: [facility] ENDOSCOPY;  Service: Pulmonary;  Laterality: Left;   HERNIA REPAIR Right    x 2   TEE WITHOUT CARDIOVERSION N/A 08/15/2021   Procedure: TRANSESOPHAGEAL ECHOCARDIOGRAM (TEE);  Surgeon: Jeffrie Oneil BROCKS, MD;  Location: [facility] ENDOSCOPY;  Service: Cardiovascular;  Laterality: N/A;   VIDEO BRONCHOSCOPY WITH ENDOBRONCHIAL NAVIGATION Left 02/16/2024   Procedure: VIDEO BRONCHOSCOPY WITH ENDOBRONCHIAL NAVIGATION;  Surgeon: Shelah Lamar RAMAN, MD;  Location: [facility] ENDOSCOPY;  Service: Pulmonary;  Laterality: Left;     FAMILY HISTORY:  Family History  Problem Relation Age of Onset   Dementia Mother    Healthy Father    Prostate cancer Brother      SOCIAL HISTORY:  reports that he has been smoking cigarettes. He started smoking about 52 years ago. He has a 26.3 pack-year smoking history. He has never used smokeless tobacco. He reports that he does not currently use alcohol after a past usage of about 2.0 - 3.0 standard drinks of alcohol per week. He reports that he does not use drugs. The patient is widowed and lives in [place].    ALLERGIES: Amlodipine, Brinzolamide-brimonidine, and Pollen extract   MEDICATIONS:  Current Outpatient Medications  Medication Sig Dispense Refill   Apoaequorin (PREVAGEN) 10 MG CAPS Take 1 tablet by mouth daily.     aspirin  EC 81 MG tablet Take 1 tablet (81 mg total) by mouth daily. Swallow whole. 30 tablet 12   atorvastatin  (LIPITOR ) 80 MG tablet Take 1 tablet (80 mg total) by mouth daily. 90 tablet 3   Cholecalciferol (THERA-D 2000) 50 MCG (2000 UT) TABS 1 tab by mouth once daily 90 tablet 99   ezetimibe  (ZETIA ) 10 MG tablet Take 1 tablet (10 mg total) by mouth daily. 90 tablet 3   fexofenadine  (ALLEGRA ) 180 MG tablet 180 mg.     losartan  (COZAAR ) 100 MG tablet Take 1 tablet by mouth once daily 90 tablet 3   memantine  (NAMENDA ) 5 MG tablet Take 1 tablet (5 mg at night) for 2 weeks, then increase  to 1 tablet (5 mg) twice a day 60 tablet 11   metoprolol  succinate (TOPROL  XL) 25 MG 24 hr tablet Take 0.5 tablets (12.5 mg total) by mouth at bedtime. 30 tablet 1   Multiple Vitamins-Minerals (VITA-MIN PO) Take 1 tablet by mouth daily at 6 (six) AM. Beef Root Supplement,Pt takes 1000 mg 1 tablet daily.     Multiple Vitamins-Minerals (VITA-MIN PO) Take 1 tablet by mouth daily at 6 (six) AM. Garlic Supplement ,Pt takes 1000 mg 1 tablet daily.     sertraline  (ZOLOFT ) 25 MG tablet Take 1 tablet (25 mg total) by mouth daily. 90 tablet 3   tamsulosin  (FLOMAX ) 0.4 MG CAPS capsule Take 0.4  mg by mouth at bedtime.     Tiotropium Bromide Monohydrate 1.25 MCG/ACT AERS Take by mouth.     triamcinolone cream (KENALOG) 0.1 % Apply 1 Application topically 2 (two) times daily as needed (dry skin).     vitamin B-12 (CYANOCOBALAMIN ) 100 MCG tablet Take 1 tablet (100 mcg total) by mouth daily. 90 tablet 3   No current facility-administered medications for this encounter.     REVIEW OF SYSTEMS: On review of systems, the patient reports that he is doing well. He denies any shortness of breath, chest pain, or trouble breathing. No other complaints are verbalized.   PHYSICAL EXAM:  Unable to assess due to encounter type   ECOG =0  0 - Asymptomatic (Fully active, able to carry on all predisease activities without restriction)  1 - Symptomatic but completely ambulatory (Restricted in physically strenuous activity but ambulatory and able to carry out work of a light or sedentary nature. For example, light housework, office work)  2 - Symptomatic, <50% in bed during the day (Ambulatory and capable of all self care but unable to carry out any work activities. Up and about more than 50% of waking hours)  3 - Symptomatic, >50% in bed, but not bedbound (Capable of only limited self-care, confined to bed or chair 50% or more of waking hours)  4 - Bedbound (Completely disabled. Cannot carry on any self-care. Totally  confined to bed or chair)  5 - Death   Raylene MM, Creech RH, Tormey DC, et al. [phone]). Toxicity and response criteria of the [facility]. Am. DOROTHA Bridges. Oncol. 5 (6): 649-55    LABORATORY DATA:  Lab Results  Component Value Date   WBC 5.9 01/14/2024   HGB 12.5 (L) 01/14/2024   HCT 38.4 (L) 01/14/2024   MCV 86.2 01/14/2024   PLT 214.0 01/14/2024   Lab Results  Component Value Date   NA 139 01/14/2024   K 4.3 01/14/2024   CL 102 01/14/2024   CO2 32 01/14/2024   Lab Results  Component Value Date   ALT 8 01/14/2024   AST 10 01/14/2024   ALKPHOS 81 01/14/2024   BILITOT 0.4 01/14/2024      RADIOGRAPHY: CT CHEST W CONTRAST Result Date: 05/18/2024 CLINICAL DATA:  Recent SBRT first baseline scan. Malignant neoplasm of upper lobe of left lung. * Tracking Code: BO * EXAM: CT CHEST WITH CONTRAST TECHNIQUE: Multidetector CT imaging of the chest was performed during intravenous contrast administration. RADIATION DOSE REDUCTION: This exam was performed according to the departmental dose-optimization program which includes automated exposure control, adjustment of the mA and/or kV according to patient size and/or use of iterative reconstruction technique. CONTRAST:  75mL OMNIPAQUE  IOHEXOL  300 MG/ML  SOLN COMPARISON:  CT scan chest from 02/09/2024 and PET-CT scan from 01/21/2024. FINDINGS: Cardiovascular: Normal cardiac size. No pericardial effusion. No aortic aneurysm. There are coronary artery calcifications, in keeping with coronary artery disease. There are also mild peripheral atherosclerotic vascular calcifications of thoracic aorta and its major branches. Mediastinum/Nodes: Visualized thyroid  gland appears grossly unremarkable. No solid / cystic mediastinal masses. The esophagus is nondistended precluding optimal assessment. There are multiple enlarged and heterogeneous mediastinal and left hilar lymph nodes with largest in the precarinal location measuring up to 2.2 x  2.6 cm and largest in the left hilum measuring up to 1.6 x 2.6 cm. These lymph nodes have significantly increased in size since the prior chest CT scan and are compatible with worsening metastatic disease. No right hilar  or bilateral axillary lymphadenopathy by size criteria. Lungs/Pleura: The central tracheo-bronchial tree is patent. Mild upper lobe predominant centrilobular and paraseptal emphysematous changes noted. There are patchy areas of linear, plate-like atelectasis and/or scarring throughout bilateral lungs. No mass or consolidation. No pleural effusion or pneumothorax. Redemonstration of multiple predominantly calcified nodules throughout bilateral lungs (marked with electronic arrow sign on series 7), which are essentially similar to the prior study. There is also stable peripheral/subpleural approximately 9 x 13 mm opacity in the left upper lobe with adjacent fiducial marker (series 7, image 56), favored to represent patient's known primary malignancy. There are also stable focal area of scarring in the right upper lobe (series 7, images 38 and 47), also unchanged. Fever, there is a new solid noncalcified 6 x 8 mm nodule in the subpleural left lower lobe (series 7, image 136), which is new since the prior study and highly concerning for metastasis. Upper Abdomen: Redemonstration of multiple, subcentimeter sized, scattered hypoattenuating foci throughout the liver, which are too small to adequately characterize but appears grossly similar to the prior study. There also stable simple cysts in visualized bilateral kidneys. There is stable heterogeneous at least 1.4 x 2.1 cm left adrenal nodule, similar to the prior study. Remaining visualized upper abdominal viscera within normal limits. Musculoskeletal: The visualized soft tissues of the chest wall are grossly unremarkable. No suspicious osseous lesions. There are mild multilevel degenerative changes in the visualized spine. Old healed  fracture/postsurgical changes noted in the left third through sixth ribs. IMPRESSION: 1. There is a new solid noncalcified 6 x 8 mm nodule in the subpleural left lower lobe, which is highly concerning for metastasis. 2. There is also significant interval increase in the size of mediastinal and left hilar lymph nodes, compatible with worsening metastatic disease. 3. Multiple other nonacute observations, as described above. Aortic Atherosclerosis (ICD10-I70.0) and Emphysema (ICD10-J43.9). Electronically Signed   By: Ree Molt M.D.   On: 05/18/2024 11:02       IMPRESSION/PLAN: 1. Stage IA2, cT1bN0M0, NSCLC, adenocarcinoma of the LUL.  Unfortunately the patient's recent scan is concerning for progressive disease.  I will reach back out to the [facility] about his case, and Dr. Shelah is planning to proceed with bronchoscopy and should be reaching out as well to schedule this.  The patient is aware that he may need additional treatment including chemotherapy and/or more radiation.  We talked about the role of getting another PET scan and an MRI brain.  These will be ordered as well.  We will follow-up with these results when available and next steps after his bronchoscopy. 2. Left Renal Hypermetabolism. [facility] and [facility]. I will reach out about this as well.   This encounter was conducted via telephone.  The patient has provided two factor identification and has given verbal consent for this type of encounter and has been advised to only accept a meeting of this type in a secure network environment. The time spent during this encounter was 60 minutes including preparation, discussion, and coordination of the patient's care. The attendants for this meeting include Sharene Cary, RN, Roberto Wallace  and Roberto MALVA Metro [name].  During the encounter,  Sharene Cary, RN, and Roberto Wallace were located at [facility] Radiation Oncology Department.  Roberto MALVA Metro [name] was located at home.   SABRA Roberto KYM Wallace, [facility]   **Disclaimer: This note was dictated with voice  recognition software. Similar sounding words can inadvertently be transcribed and this note may contain transcription errors which may not have been corrected upon publication of note.**

## 2024-05-31 NOTE — Addendum Note (Signed)
 Encounter addended by: Lanell Donald Stagger, PA-C on: 05/31/2024 9:02 AM  Actions taken: Clinical Note Signed, In Basket message sent

## 2024-06-01 ENCOUNTER — Ambulatory Visit (HOSPITAL_COMMUNITY)
Admission: RE | Admit: 2024-06-01 | Discharge: 2024-06-01 | Disposition: A | Discharge status: Home / Self Care | Source: Ambulatory Visit | Attending: Radiation Oncology | Admitting: Radiation Oncology

## 2024-06-01 ENCOUNTER — Encounter: Payer: Self-pay | Admitting: Emergency Medicine

## 2024-06-01 DIAGNOSIS — C3490 Malignant neoplasm of unspecified part of unspecified bronchus or lung: Secondary | ICD-10-CM | POA: Diagnosis present

## 2024-06-01 LAB — GLUCOSE, CAPILLARY: Glucose-Capillary: 78 mg/dL (ref 70–99)

## 2024-06-01 MED ORDER — FLUDEOXYGLUCOSE F - 18 (FDG) INJECTION
5.2000 | Freq: Once | INTRAVENOUS | Status: AC
Start: 2024-06-01 — End: 2024-06-01
  Administered 2024-06-01: 5.56 via INTRAVENOUS

## 2024-06-01 MED ORDER — GADOBUTROL 1 MMOL/ML IV SOLN
5.0000 mL | Freq: Once | INTRAVENOUS | Status: AC | PRN
  Administered 2024-06-01: 5 mL via INTRAVENOUS

## 2024-06-01 NOTE — Telephone Encounter (Signed)
 I will contact the patient and give info will send to [facility] to get serbia

## 2024-06-02 ENCOUNTER — Telehealth: Payer: Self-pay | Admitting: Radiation Oncology

## 2024-06-02 NOTE — Telephone Encounter (Signed)
 I attempted to call the patient to review his PET and MRI results. He is scheduled for bronchoscopy on 06/13/24 and I've reviewed his case with medical oncology at the [facility]. They prefer he have bronchoscopy to try to evaluate the LLL nodule and mediastinal nodes as opposed to doing a bone biopsy. The would like molecular testing on his cytology to determine recommendations for systemic therapy. I will try to reach back again to hear if he is having any pain from his bony disease in which case we'd offer palliative radiation, or if after Dr. Dewey reviews there may still be a beneficial role in radiotherapy.

## 2024-06-07 ENCOUNTER — Telehealth: Payer: Self-pay | Admitting: *Deleted

## 2024-06-07 ENCOUNTER — Telehealth: Payer: Self-pay | Admitting: Radiation Oncology

## 2024-06-07 NOTE — Telephone Encounter (Signed)
 Spoke to [employer] PA-C advised that we give or send patient's a letter with all the information they need prior to the bronc, however, I would call him and give it to him over the phone and send it to him in the mail.  She was very Adult nurse.  I called and spoke with patient, I reviewed all the information on the bronch letter and advised him I would put it in the mail to him today.  I verified his mailing address and put it in the mail.  He verbalized understanding.

## 2024-06-07 NOTE — Telephone Encounter (Signed)
 I was able to reach the patient today and review his PET, and plan for bronchoscopy on 10/20. I gave him the phone number for Dr. Lanny office to clarify questions about what time he's supposed to go to the hospital. He will proceed with additional guidance from the [facility].

## 2024-06-09 ENCOUNTER — Encounter (HOSPITAL_COMMUNITY): Payer: Self-pay | Admitting: Emergency Medicine

## 2024-06-09 ENCOUNTER — Other Ambulatory Visit: Payer: Self-pay

## 2024-06-09 NOTE — Progress Notes (Signed)
 SDW CALL  Patient was given pre-op instructions over the phone. The opportunity was given for the patient to ask questions. No further questions asked. Patient verbalized understanding of instructions given.  Patient was very frustrated that he was receiving another phone call with questions and to give instructions.  Patient did answer all questions appropriately.  Patient verbalized understanding to stop Aspirin  2 days prior to the procedure.  Patient was also aware that he would need someone to stay with him for 24 hours after the procedure and stated that would not be a problem.     PCP - Dr. Lynwood Rush Cardiologist - Dr. Lurena Red - [facility] - 12/29/23  PPM/ICD - denies Device Orders -  Rep Notified -   Chest x-ray - 02/16/24 EKG - 12/29/23 Stress Test -  ECHO - 08/15/21 Cardiac Cath -   Sleep Study - denies CPAP - n/a  No DM  Last dose of GLP1 agonist-  n/a GLP1 instructions:  n/a  Blood Thinner Instructions: n/a Aspirin  Instructions: instructed to hold Aspirin  for 2 days with last dose on Friday, 10/17  ERAS Protcol - NPO PRE-SURGERY Ensure or G2- n/a  COVID TEST- n/a   Anesthesia review:  yes - sent on 10/16  Patient denies shortness of breath, fever, cough and chest pain over the phone call   All instructions explained to the patient, with a verbal understanding of the material. Patient agrees to go over the instructions while at home for a better understanding.

## 2024-06-10 NOTE — Progress Notes (Signed)
 Anesthesia Chart Review: Same day workup  83 year old male follows with cardiology for history of HTN, NSVT, prior CVA, PAD, mild nonobstructive CAD by CTA 09/2021.  He has a known large secundum ASD with bidirectional flow which was not amenable to closure by transcatheter techniques.  Last seen in cardiology follow-up by Dr. Darron on 12/29/2023.  Discussed that he has evidence of diffuse peripheral arterial disease, no lifestyle limiting claudication, no evidence of critical limb ischemia.  He uses a motorized wheelchair secondary to severe arthritis.   Current smoker recently referred to pulmonology by the [facility] for evaluation and biopsy of lung nodule.  PET scan 01/21/2024 showed hypermetabolic left upper lobe nodule as well as hypermetabolic area along the inferior margin of the left kidney. He underwent bronchoscopy with Dr. 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 He completed SBRT to the L[facility] n August 2025 without difficulty.  He is also working with medical oncology at the [facility]. Recent follow-up scan concerning for progressive disease.  He was referred back to Dr. Shelah for bronchoscopy and biopsy.   CMP 06/02/2024 in Care Everywhere reviewed, WNL, sodium 141, potassium 4.2, creatinine 1.15.  CBC 06/02/2024 in Care Everywhere reviewed, mild anemia with hemoglobin 13.2, otherwise unremarkable, WBC 5.86, PLT 219.  Patient will need day of surgery evaluation.  EKG 12/29/2023: NSR.  Rate 78.   Coronary CTA 10/22/2021: IMPRESSION: 1. Coronary calcium  score of 127. This was 25 percentile for age and sex matched control.   2. Normal coronary origin with right dominance.   3. CAD-RADS 2. Mild non-obstructive CAD (25-49%). Consider non-atherosclerotic causes of chest pain. Consider preventive therapy and risk factor modification.   4. Large Secundum ASD noted with dilatation of the right atrium and the right ventricle.   TTE 08/15/2021: 1. Large Secundum atrial septal  defect (ASD) with bidirectional flow, 1.7  x 1.8 cm.   2. Left ventricular ejection fraction, by estimation, is 60 to 65%. The  left ventricle has normal function. The left ventricle has no regional  wall motion abnormalities.   3. Right ventricular systolic function is normal. The right ventricular  size is mildly enlarged. There is normal pulmonary artery systolic  pressure.   4. No left atrial/left atrial appendage thrombus was detected.   5. The mitral valve is normal in structure. Trivial mitral valve  regurgitation. No evidence of mitral stenosis.   6. The aortic valve is tricuspid. Aortic valve regurgitation is trivial.  No aortic stenosis is present.   7. The inferior vena cava is normal in size with greater than 50%  respiratory variability, suggesting right atrial pressure of 3 mmHg.   8. Evidence of atrial level shunting detected by color flow Doppler.  Agitated saline contrast bubble study was positive with shunting observed  within 3-6 cardiac cycles suggestive of interatrial shunt. There is a  large secundum atrial septal defect with   bidirectional shunting across the atrial septum.      Lynwood Geofm RIGGERS [facility] Stay [facility]/Anesthesiology Phone [phone] 06/10/2024 10:17 AM

## 2024-06-10 NOTE — Anesthesia Preprocedure Evaluation (Signed)
 Anesthesia Evaluation  Patient identified by MRN, date of birth, ID band Patient awake    Reviewed: Allergy & Precautions, NPO status , Patient's Chart, lab work & pertinent test results  Airway Mallampati: I  TM Distance: >3 FB     Dental  (+) Edentulous Upper, Edentulous Lower   Pulmonary COPD,  COPD inhaler, Current Smoker and Patient abstained from smoking. Mediastinal adenopathy Pulmonary nodule which was adenoCa  PET scan 01/21/2024 showed hypermetabolic left upper lobe nodule as well as hypermetabolic area along the inferior margin of the left kidney. He underwent bronchoscopy with Dr. 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 He completed SBRT to the L[facility] n August 2025 without difficulty    breath sounds clear to auscultation + decreased breath sounds      Cardiovascular hypertension, Pt. on medications + CAD, + Peripheral Vascular Disease and +CHF   Rhythm:Regular Rate:Normal + Systolic murmurs Echo 08/15/21  1. Large Secundum atrial septal defect (ASD) with bidirectional flow, 1.7  x 1.8 cm.   2. Left ventricular ejection fraction, by estimation, is 60 to 65%. The  left ventricle has normal function. The left ventricle has no regional  wall motion abnormalities.   3. Right ventricular systolic function is normal. The right ventricular  size is mildly enlarged. There is normal pulmonary artery systolic  pressure.   4. No left atrial/left atrial appendage thrombus was detected.   5. The mitral valve is normal in structure. Trivial mitral valve  regurgitation. No evidence of mitral stenosis.   6. The aortic valve is tricuspid. Aortic valve regurgitation is trivial.  No aortic stenosis is present.   7. The inferior vena cava is normal in size with greater than 50%  respiratory variability, suggesting right atrial pressure of 3 mmHg.   8. Evidence of atrial level shunting detected by color flow  Doppler.  Agitated saline contrast bubble study was positive with shunting observed  within 3-6 cardiac cycles suggestive of interatrial shunt. There is a  large secundum atrial septal defect with   bidirectional shunting across the atrial septum.   EKG 12/29/23 NSR, Normal   Neuro/Psych  Headaches PSYCHIATRIC DISORDERS Anxiety Depression   Dementia CVA, No Residual Symptoms    GI/Hepatic Neg liver ROS,GERD  Medicated,,  Endo/Other  Hyperlipidemia  Renal/GU Renal InsufficiencyRenal disease  negative genitourinary   Musculoskeletal  (+) Arthritis , Osteoarthritis,    Abdominal   Peds  Hematology negative hematology ROS (+)   Anesthesia Other Findings   Reproductive/Obstetrics                              Anesthesia Physical Anesthesia Plan  ASA: 3  Anesthesia Plan: General   Post-op Pain Management: Minimal or no pain anticipated   Induction: Intravenous  PONV Risk Score and Plan: 2 and Treatment may vary due to age or medical condition, TIVA, Ondansetron  and Dexamethasone   Airway Management Planned: Oral ETT  Additional Equipment: None  Intra-op Plan:   Post-operative Plan: Extubation in OR  Informed Consent: I have reviewed the patients History and Physical, chart, labs and discussed the procedure including the risks, benefits and alternatives for the proposed anesthesia with the patient or authorized representative who has indicated his/her understanding and acceptance.     Dental advisory given  Plan Discussed with: CRNA and Anesthesiologist  Anesthesia Plan Comments: (PAT note by Lynwood Hope, PA-C:  83 year old male follows with cardiology for history of  HTN, NSVT, prior CVA, PAD, mild nonobstructive CAD by CTA 09/2021.  He has a known large secundum ASD with bidirectional flow which was not amenable to closure by transcatheter techniques.  Last seen in cardiology follow-up by Dr. Darron on 12/29/2023.  Discussed that he has  evidence of diffuse peripheral arterial disease, no lifestyle limiting claudication, no evidence of critical limb ischemia.  He uses a motorized wheelchair secondary to severe arthritis.   Current smoker recently referred to pulmonology by the [facility] for evaluation and biopsy of lung nodule.  PET scan 01/21/2024 showed hypermetabolic left upper lobe nodule as well as hypermetabolic area along the inferior margin of the left kidney. He underwent bronchoscopy with Dr. 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 He completed SBRT to the [facility] n August 2025 without difficulty.  He is also working with medical oncology at the [facility]. Recent follow-up scan concerning for progressive disease.  He was referred back to Dr. Shelah for bronchoscopy and biopsy.   CMP 06/02/2024 in Care Everywhere reviewed, WNL, sodium 141, potassium 4.2, creatinine 1.15.  CBC 06/02/2024 in Care Everywhere reviewed, mild anemia with hemoglobin 13.2, otherwise unremarkable, WBC 5.86, PLT 219.  Patient will need day of surgery evaluation.  EKG 12/29/2023: NSR.  Rate 78.   Coronary CTA 10/22/2021: IMPRESSION: 1. Coronary calcium  score of 127. This was 48 percentile for age and sex matched control.   2. Normal coronary origin with right dominance.   3. CAD-RADS 2. Mild non-obstructive CAD (25-49%). Consider non-atherosclerotic causes of chest pain. Consider preventive therapy and risk factor modification.   4. Large Secundum ASD noted with dilatation of the right atrium and the right ventricle.   TTE 08/15/2021: 1. Large Secundum atrial septal defect (ASD) with bidirectional flow, 1.7  x 1.8 cm.   2. Left ventricular ejection fraction, by estimation, is 60 to 65%. The  left ventricle has normal function. The left ventricle has no regional  wall motion abnormalities.   3. Right ventricular systolic function is normal. The right ventricular  size is mildly enlarged. There is normal pulmonary artery systolic   pressure.   4. No left atrial/left atrial appendage thrombus was detected.   5. The mitral valve is normal in structure. Trivial mitral valve  regurgitation. No evidence of mitral stenosis.   6. The aortic valve is tricuspid. Aortic valve regurgitation is trivial.  No aortic stenosis is present.   7. The inferior vena cava is normal in size with greater than 50%  respiratory variability, suggesting right atrial pressure of 3 mmHg.   8. Evidence of atrial level shunting detected by color flow Doppler.  Agitated saline contrast bubble study was positive with shunting observed  within 3-6 cardiac cycles suggestive of interatrial shunt. There is a  large secundum atrial septal defect with   bidirectional shunting across the atrial septum.     )         Anesthesia Quick Evaluation

## 2024-06-13 ENCOUNTER — Ambulatory Visit (HOSPITAL_COMMUNITY)

## 2024-06-13 ENCOUNTER — Encounter (HOSPITAL_COMMUNITY)
Admission: RE | Disposition: A | Discharge status: Home / Self Care | Payer: Self-pay | Source: Home / Self Care | Attending: Emergency Medicine

## 2024-06-13 ENCOUNTER — Encounter (HOSPITAL_COMMUNITY): Payer: Self-pay | Admitting: Emergency Medicine

## 2024-06-13 ENCOUNTER — Ambulatory Visit (HOSPITAL_COMMUNITY)
Admission: RE | Admit: 2024-06-13 | Discharge: 2024-06-13 | Disposition: A | Discharge status: Home / Self Care | Attending: Emergency Medicine | Admitting: Emergency Medicine

## 2024-06-13 ENCOUNTER — Ambulatory Visit (HOSPITAL_BASED_OUTPATIENT_CLINIC_OR_DEPARTMENT_OTHER): Admitting: Physician Assistant

## 2024-06-13 ENCOUNTER — Ambulatory Visit (HOSPITAL_COMMUNITY): Admitting: Physician Assistant

## 2024-06-13 DIAGNOSIS — C771 Secondary and unspecified malignant neoplasm of intrathoracic lymph nodes: Secondary | ICD-10-CM | POA: Insufficient documentation

## 2024-06-13 DIAGNOSIS — Z8619 Personal history of other infectious and parasitic diseases: Secondary | ICD-10-CM | POA: Diagnosis not present

## 2024-06-13 DIAGNOSIS — F0390 Unspecified dementia without behavioral disturbance: Secondary | ICD-10-CM | POA: Insufficient documentation

## 2024-06-13 DIAGNOSIS — F17210 Nicotine dependence, cigarettes, uncomplicated: Secondary | ICD-10-CM | POA: Insufficient documentation

## 2024-06-13 DIAGNOSIS — Z923 Personal history of irradiation: Secondary | ICD-10-CM | POA: Insufficient documentation

## 2024-06-13 DIAGNOSIS — R911 Solitary pulmonary nodule: Secondary | ICD-10-CM

## 2024-06-13 DIAGNOSIS — I2510 Atherosclerotic heart disease of native coronary artery without angina pectoris: Secondary | ICD-10-CM | POA: Insufficient documentation

## 2024-06-13 DIAGNOSIS — C3412 Malignant neoplasm of upper lobe, left bronchus or lung: Secondary | ICD-10-CM

## 2024-06-13 DIAGNOSIS — I739 Peripheral vascular disease, unspecified: Secondary | ICD-10-CM | POA: Insufficient documentation

## 2024-06-13 DIAGNOSIS — R590 Localized enlarged lymph nodes: Secondary | ICD-10-CM

## 2024-06-13 DIAGNOSIS — I110 Hypertensive heart disease with heart failure: Secondary | ICD-10-CM | POA: Diagnosis not present

## 2024-06-13 DIAGNOSIS — F32A Depression, unspecified: Secondary | ICD-10-CM | POA: Diagnosis not present

## 2024-06-13 DIAGNOSIS — I10 Essential (primary) hypertension: Secondary | ICD-10-CM

## 2024-06-13 DIAGNOSIS — I509 Heart failure, unspecified: Secondary | ICD-10-CM | POA: Insufficient documentation

## 2024-06-13 DIAGNOSIS — C3432 Malignant neoplasm of lower lobe, left bronchus or lung: Secondary | ICD-10-CM | POA: Diagnosis not present

## 2024-06-13 DIAGNOSIS — Z77090 Contact with and (suspected) exposure to asbestos: Secondary | ICD-10-CM | POA: Diagnosis not present

## 2024-06-13 DIAGNOSIS — M1990 Unspecified osteoarthritis, unspecified site: Secondary | ICD-10-CM | POA: Insufficient documentation

## 2024-06-13 DIAGNOSIS — Q2111 Secundum atrial septal defect: Secondary | ICD-10-CM | POA: Insufficient documentation

## 2024-06-13 DIAGNOSIS — J449 Chronic obstructive pulmonary disease, unspecified: Secondary | ICD-10-CM | POA: Diagnosis not present

## 2024-06-13 DIAGNOSIS — Z8673 Personal history of transient ischemic attack (TIA), and cerebral infarction without residual deficits: Secondary | ICD-10-CM | POA: Insufficient documentation

## 2024-06-13 HISTORY — DX: Atherosclerotic heart disease of native coronary artery without angina pectoris: I25.10

## 2024-06-13 HISTORY — PX: VIDEO BRONCHOSCOPY WITH ENDOBRONCHIAL NAVIGATION: SHX6175

## 2024-06-13 HISTORY — DX: Peripheral vascular disease, unspecified: I73.9

## 2024-06-13 HISTORY — PX: VIDEO BRONCHOSCOPY WITH ENDOBRONCHIAL ULTRASOUND: SHX6177

## 2024-06-13 SURGERY — VIDEO BRONCHOSCOPY WITH ENDOBRONCHIAL NAVIGATION
Anesthesia: General

## 2024-06-13 MED ORDER — CHLORHEXIDINE GLUCONATE 0.12 % MT SOLN
OROMUCOSAL | Status: AC
  Administered 2024-06-13: 15 mL via OROMUCOSAL
  Filled 2024-06-13: qty 15

## 2024-06-13 MED ORDER — ROCURONIUM BROMIDE 10 MG/ML (PF) SYRINGE
PREFILLED_SYRINGE | INTRAVENOUS | Status: DC | PRN
  Administered 2024-06-13: 50 mg via INTRAVENOUS
  Administered 2024-06-13: 10 mg via INTRAVENOUS

## 2024-06-13 MED ORDER — LIDOCAINE 2% (20 MG/ML) 5 ML SYRINGE
INTRAMUSCULAR | Status: DC | PRN
  Administered 2024-06-13: 60 mg via INTRAVENOUS

## 2024-06-13 MED ORDER — PHENYLEPHRINE 80 MCG/ML (10ML) SYRINGE FOR IV PUSH (FOR BLOOD PRESSURE SUPPORT)
PREFILLED_SYRINGE | INTRAVENOUS | Status: DC | PRN
  Administered 2024-06-13 (×2): 160 ug via INTRAVENOUS

## 2024-06-13 MED ORDER — ONDANSETRON HCL 4 MG/2ML IJ SOLN
INTRAMUSCULAR | Status: DC | PRN
  Administered 2024-06-13: 4 mg via INTRAVENOUS

## 2024-06-13 MED ORDER — ESMOLOL HCL 100 MG/10ML IV SOLN
INTRAVENOUS | Status: DC | PRN
  Administered 2024-06-13: 30 mg via INTRAVENOUS
  Administered 2024-06-13: 20 mg via INTRAVENOUS

## 2024-06-13 MED ORDER — DEXAMETHASONE SOD PHOSPHATE PF 10 MG/ML IJ SOLN
INTRAMUSCULAR | Status: DC | PRN
  Administered 2024-06-13: 10 mg via INTRAVENOUS

## 2024-06-13 MED ORDER — PROPOFOL 500 MG/50ML IV EMUL
INTRAVENOUS | Status: DC | PRN
  Administered 2024-06-13: 150 ug/kg/min via INTRAVENOUS

## 2024-06-13 MED ORDER — FENTANYL CITRATE (PF) 100 MCG/2ML IJ SOLN: 25.0000 ug | INTRAMUSCULAR | Status: DC | PRN

## 2024-06-13 MED ORDER — LACTATED RINGERS IV SOLN
INTRAVENOUS | Status: DC
Start: 2024-06-13 — End: 2024-06-13

## 2024-06-13 MED ORDER — GLYCOPYRROLATE 0.2 MG/ML IJ SOLN
INTRAMUSCULAR | Status: DC | PRN
  Administered 2024-06-13: .1 mg via INTRAVENOUS

## 2024-06-13 MED ORDER — OXYCODONE HCL 5 MG PO TABS: 5.0000 mg | ORAL_TABLET | Freq: Once | ORAL | Status: DC | PRN

## 2024-06-13 MED ORDER — SUGAMMADEX SODIUM 200 MG/2ML IV SOLN
INTRAVENOUS | Status: DC | PRN
  Administered 2024-06-13: 100 mg via INTRAVENOUS

## 2024-06-13 MED ORDER — CHLORHEXIDINE GLUCONATE 0.12 % MT SOLN: 15.0000 mL | Freq: Once | OROMUCOSAL | Status: AC

## 2024-06-13 MED ORDER — OXYCODONE HCL 5 MG/5ML PO SOLN: 5.0000 mg | Freq: Once | ORAL | Status: DC | PRN

## 2024-06-13 MED ORDER — PROPOFOL 10 MG/ML IV BOLUS
INTRAVENOUS | Status: DC | PRN
  Administered 2024-06-13: 80 mg via INTRAVENOUS

## 2024-06-13 SURGICAL SUPPLY — 37 items
ADAPTER BRONCHOSCOPE OLYMPUS (ADAPTER) ×2 IMPLANT
ADAPTER VALVE BIOPSY EBUS (MISCELLANEOUS) IMPLANT
BAG COUNTER SPONGE SURGICOUNT (BAG) ×2 IMPLANT
BRUSH CYTOL CELLEBRITY 1.5X140 (MISCELLANEOUS) ×2 IMPLANT
BRUSH SUPERTRAX BIOPSY (INSTRUMENTS) IMPLANT
BRUSH SUPERTRAX NDL-TIP CYTO (INSTRUMENTS) ×2 IMPLANT
CANISTER SUCTION 3000ML PPV (SUCTIONS) ×2 IMPLANT
CNTNR URN SCR LID CUP LEK RST (MISCELLANEOUS) ×2 IMPLANT
COVER BACK TABLE 60X90IN (DRAPES) ×2 IMPLANT
FILTER STRAW FLUID ASPIR (MISCELLANEOUS) IMPLANT
FORCEPS BIOP 1.5 SINGLE USE (MISCELLANEOUS) ×2 IMPLANT
FORCEPS BIOP SUPERTRX PREMAR (INSTRUMENTS) ×2 IMPLANT
GAUZE SPONGE 4X4 12PLY STRL (GAUZE/BANDAGES/DRESSINGS) ×2 IMPLANT
GLOVE BIO SURGEON STRL SZ7.5 (GLOVE) ×4 IMPLANT
GOWN STRL REUS W/ TWL LRG LVL3 (GOWN DISPOSABLE) ×4 IMPLANT
KIT CLEAN ENDO COMPLIANCE (KITS) ×2 IMPLANT
KIT LOCATABLE GUIDE (CANNULA) IMPLANT
KIT MARKER FIDUCIAL DELIVERY (KITS) IMPLANT
KIT TURNOVER KIT B (KITS) ×2 IMPLANT
MARKER SKIN DUAL TIP RULER LAB (MISCELLANEOUS) ×2 IMPLANT
NDL SUPERTRX PREMARK BIOPSY (NEEDLE) ×2 IMPLANT
NEEDLE SUPERTRX PREMARK BIOPSY (NEEDLE) ×2 IMPLANT
OIL SILICONE PENTAX (PARTS (SERVICE/REPAIRS)) ×2 IMPLANT
PAD ARMBOARD POSITIONER FOAM (MISCELLANEOUS) ×4 IMPLANT
PATCHES PATIENT (LABEL) ×6 IMPLANT
SOLN 0.9% NACL POUR BTL 1000ML (IV SOLUTION) ×2 IMPLANT
SOLN STERILE WATER BTL 1000 ML (IV SOLUTION) ×2 IMPLANT
SYR 20ML ECCENTRIC (SYRINGE) ×2 IMPLANT
SYR 20ML LL LF (SYRINGE) ×2 IMPLANT
SYR 50ML SLIP (SYRINGE) ×2 IMPLANT
TOWEL GREEN STERILE FF (TOWEL DISPOSABLE) ×2 IMPLANT
TRAP SPECIMEN MUCUS 40CC (MISCELLANEOUS) IMPLANT
TUBE CONNECTING 20X1/4 (TUBING) ×2 IMPLANT
UNDERPAD 30X36 HEAVY ABSORB (UNDERPADS AND DIAPERS) ×2 IMPLANT
VALVE BIOPSY SINGLE USE (MISCELLANEOUS) ×2 IMPLANT
VALVE SUCTION BRONCHIO DISP (MISCELLANEOUS) ×2 IMPLANT
fiducial marker IMPLANT

## 2024-06-13 NOTE — Transfer of Care (Signed)
 Immediate Anesthesia Transfer of Care Note  Patient: Roberto Wallace.  Procedure(s) Performed: VIDEO BRONCHOSCOPY WITH ENDOBRONCHIAL NAVIGATION (Left) BRONCHOSCOPY, WITH EBUS  Patient Location: PACU  Anesthesia Type:General  Level of Consciousness: drowsy  Airway & Oxygen Therapy: Patient Spontanous Breathing  Post-op Assessment: Report given to RN and Post -op Vital signs reviewed and stable  Post vital signs: Reviewed and stable  Last Vitals:  Vitals Value Taken Time  BP 120/84 06/13/24 09:10  Temp    Pulse 113 06/13/24 09:12  Resp 23 06/13/24 09:12  SpO2 98 % 06/13/24 09:12  Vitals shown include unfiled device data.  Last Pain:  Vitals:   06/13/24 0617  TempSrc:   PainSc: 0-No pain         Complications: No notable events documented.

## 2024-06-13 NOTE — Op Note (Signed)
 Procedure Note  Patient: Roberto DOLECKI [name]  [employer] Cios mobile C-arm was utilized to identify and biopsy left lower lobe pulmonary nodule.  Needle-in-lesion was confirmed using real-time Cios imaging, and images were uploaded to PACS.       [name], MD, PhD 06/13/2024, 9:09 AM [facility] [phone] or if no answer before 7:00PM call [phone] For any issues after 7:00PM please call eLink [phone]

## 2024-06-13 NOTE — Anesthesia Postprocedure Evaluation (Signed)
 Anesthesia Post Note  Patient: Roberto COWANS [name]  Procedure(s) Performed: VIDEO BRONCHOSCOPY WITH ENDOBRONCHIAL NAVIGATION (Left) BRONCHOSCOPY, WITH EBUS     Patient location during evaluation: PACU Anesthesia Type: General Level of consciousness: awake and alert and oriented Pain management: pain level controlled Vital Signs Assessment: post-procedure vital signs reviewed and stable Respiratory status: spontaneous breathing, nonlabored ventilation and respiratory function stable Cardiovascular status: blood pressure returned to baseline and stable Postop Assessment: no apparent nausea or vomiting Anesthetic complications: no   No notable events documented.  Last Vitals:  Vitals:   06/13/24 0930 06/13/24 0940  BP: (!) 125/97 (!) 145/95  Pulse: (!) 105 100  Resp: 20 20  Temp:  (!) 36.4 C  SpO2: 99% 98%    Last Pain:  Vitals:   06/13/24 0940  TempSrc:   PainSc: 0-No pain   Pain Goal:                   [name] A.

## 2024-06-13 NOTE — Op Note (Signed)
 Video Bronchoscopy with Endobronchial Ultrasound and Electromagnetic Navigation Procedure Note  Date of Operation: 06/13/2024  Pre-op Diagnosis: Left lower lobe nodule, mediastinal and hilar adenopathy  Post-op Diagnosis: Same  Surgeon: [name]  Assistants: None  Anesthesia: General endotracheal anesthesia  Operation: Flexible video fiberoptic bronchoscopy with endobronchial ultrasound, robotic assisted navigation and biopsies.  Estimated Blood Loss: Minimal  Complications: None apparent  Indications and History: Roberto Wallace. is a 83 y.o. male with history of tobacco use and newly diagnosed adenocarcinoma of the left upper lobe that was treated with SBRT.  Surveillance imaging has revealed a new left lower lobe nodule and mediastinal and hilar adenopathy.  Recommendation was made to achieve a tissue diagnosis using endobronchial ultrasound and robotic assisted navigational bronchoscopy. The risks, benefits, complications, treatment options and expected outcomes were discussed with the patient.  The possibilities of pneumothorax, pneumonia, reaction to medication, pulmonary aspiration, perforation of a viscus, bleeding, failure to diagnose a condition and creating a complication requiring transfusion or operation were discussed with the patient who freely signed the consent.    Description of Procedure: The patient was seen in the Preoperative Area, was examined and was deemed appropriate to proceed.  The patient was taken to [facility] Endoscopy room 3, identified as Roberto MALVA Nelwyn [name] and the procedure verified as Flexible Video Fiberoptic Bronchoscopy with robotic assisted navigation and endobronchial ultrasound.  A Time Out was held and the above information confirmed.   Robotic assisted navigation: Prior to the date of the procedure a high-resolution CT scan of the chest was performed. Utilizing ION software program a virtual tracheobronchial tree was generated to allow the  creation of distinct navigation pathways to the patient's parenchymal abnormalities. After being taken to the operating room general anesthesia was initiated and the patient  was orally intubated. The video fiberoptic bronchoscope was introduced via the endotracheal tube and a general inspection was performed which showed normal right and left lung anatomy. Aspiration of the bilateral mainstems was completed to remove any remaining secretions. Robotic catheter inserted into patient's endotracheal tube.   Target #1 left lower lobe nodule: The distinct navigation pathways prepared prior to this procedure were then utilized to navigate to patient's lesion identified on CT scan. The robotic catheter was secured into place and the vision probe was withdrawn.  Lesion location was approximated using fluoroscopy.  Local registration and targeting was performed using [employer] Cios mobile C-arm three-dimensional imaging. Under fluoroscopic guidance transbronchial needle brushings, transbronchial needle biopsies, and transbronchial forceps biopsies were performed to be sent for cytology and pathology.  Needle-in-lesion was confirmed using Cios mobile C-arm. Under fluoroscopic guidance a single fiducial marker was placed adjacent to the nodule.    Endobronchial ultrasound: The robotic scope was then withdrawn and the endobronchial ultrasound was used to identify and characterize the peritracheal, hilar and bronchial lymph nodes. Inspection showed enlargement at station 10L, 4L, 7, 4R, 11R. Using real-time ultrasound guidance Wang needle biopsies were take from Station 11R, 4R, 7 nodes and were sent for cytology.   At the end of the procedure a general airway inspection was performed and there was no evidence of active bleeding. The bronchoscope was removed.  The patient tolerated the procedure well. There was no significant blood loss and there were no obvious complications. A post-procedural chest x-ray  is pending.  Samples Target #1: 1. Transbronchial Wang needle biopsies from left lower lobe nodule 2. Transbronchial forceps biopsies from left lower lobe nodule   EBUS Samples: 1. Roberto Wallace  needle biopsies from 11R node 2. Wang needle biopsies from 4R node 3. Wang needle biopsies from 7 node    [name], MD, PhD 06/13/2024, 9:01 AM [facility]

## 2024-06-13 NOTE — Discharge Instructions (Addendum)
 Flexible Bronchoscopy, Care After This sheet gives you information about how to care for yourself after your test. Your doctor may also give you more specific instructions. If you have problems or questions, contact your doctor. Follow these instructions at home: Eating and drinking When you are wide awake, your numbness is gone and your cough and gag reflexes have come back, you may: Start eating only soft foods. Slowly drink liquids. Six hours after the test, go back to your normal diet. Driving Do not drive for 24 hours if you were given a medicine to help you relax (sedative). Do not drive or use heavy machinery while taking prescription pain medicine. General instructions Take over-the-counter and prescription medicines only as told by your doctor. Return to your normal activities as told. Ask what activities are safe for you. Do not use any products that have nicotine  or tobacco in them. This includes cigarettes and e-cigarettes. If you need help quitting, ask your doctor. Keep all follow-up visits as told by your doctor. This is important. It is very important if you had a tissue sample (biopsy) taken. Get help right away if: You have shortness of breath that gets worse. You get light-headed. You feel like you are going to pass out (faint). You have chest pain. You cough up: More than a little blood. More blood than before. Summary Do not use cigarettes. Do not use e-cigarettes. Seek care in the Emergency Department right away if you have chest pain or shortness of breath. Call or MyChart Message our office for any questions or problems at [phone].  Okay to restart your aspirin  on 06/14/2024   This information is not intended to replace advice given to you by your health care provider. Make sure you discuss any questions you have with your health care provider.

## 2024-06-13 NOTE — H&P (Signed)
 Roberto NAHAR [name] is an 83 y.o. male.   Chief Complaint: Left lower lobe nodule, mediastinal and hilar adenopathy HPI: 83 year old gentleman with a history of former tobacco use, asbestos exposure, hypertension, CVA, TB.  He was diagnosed with left upper lobe adenocarcinoma by navigational bronchoscopy in June 2025 and underwent SBRT.  His follow-up imaging has revealed a new left lower lobe pulmonary nodule and enlarged hypermetabolic mediastinal and hilar adenopathy concerning for persistent/recurrent disease.  He presents now for repeat navigational bronchoscopy, endobronchial ultrasound to obtain a tissue diagnosis.  He understands the rationale, risks, benefits of the procedure.  He has been doing well, has a somewhat limited functional capacity but it has been stable.  No barriers identified.  Past Medical History:  Diagnosis Date   Aneurysm    per pt,in head   Arthritis    Chicken pox    Coronary artery disease    Depression    Hypercholesteremia    Hypertension    Migraine    Peripheral vascular disease    PTSD (post-traumatic stress disorder) 06/24/2017   Stroke (HCC)    No residual effects   Syphilis    Tuberculosis    Vitamin D  deficiency     Past Surgical History:  Procedure Laterality Date   BUBBLE STUDY  08/15/2021   Procedure: BUBBLE STUDY;  Surgeon: Jeffrie Oneil BROCKS, MD;  Location: [facility] ENDOSCOPY;  Service: Cardiovascular;;   COLONOSCOPY     ENDOBRONCHIAL ULTRASOUND Left 02/16/2024   Procedure: ENDOBRONCHIAL ULTRASOUND (EBUS);  Surgeon: Shelah Roberto RAMAN, MD;  Location: [facility] ENDOSCOPY;  Service: Pulmonary;  Laterality: Left;   HERNIA REPAIR Right    x 2   TEE WITHOUT CARDIOVERSION N/A 08/15/2021   Procedure: TRANSESOPHAGEAL ECHOCARDIOGRAM (TEE);  Surgeon: Jeffrie Oneil BROCKS, MD;  Location: [facility] ENDOSCOPY;  Service: Cardiovascular;  Laterality: N/A;   VIDEO BRONCHOSCOPY WITH ENDOBRONCHIAL NAVIGATION Left 02/16/2024   Procedure: VIDEO BRONCHOSCOPY WITH ENDOBRONCHIAL NAVIGATION;   Surgeon: Shelah Roberto RAMAN, MD;  Location: [facility] ENDOSCOPY;  Service: Pulmonary;  Laterality: Left;    Family History  Problem Relation Age of Onset   Dementia Mother    Healthy Father    Prostate cancer Brother    Social History:  reports that he has been smoking cigarettes. He started smoking about 52 years ago. He has a 26.3 pack-year smoking history. He has never used smokeless tobacco. He reports that he does not currently use alcohol after a past usage of about 2.0 - 3.0 standard drinks of alcohol per week. He reports that he does not use drugs.  Allergies:  Allergies  Allergen Reactions   Amlodipine Rash   Brinzolamide-Brimonidine Other (See Comments)   Pollen Extract Other (See Comments)    Other reaction(s): Sinusitis    Medications Prior to Admission  Medication Sig Dispense Refill   Apoaequorin (PREVAGEN) 10 MG CAPS Take 1 tablet by mouth daily.     aspirin  EC 81 MG tablet Take 1 tablet (81 mg total) by mouth daily. Swallow whole. 30 tablet 12   atorvastatin  (LIPITOR ) 80 MG tablet Take 1 tablet (80 mg total) by mouth daily. 90 tablet 3   Cholecalciferol (THERA-D 2000) 50 MCG (2000 UT) TABS 1 tab by mouth once daily 90 tablet 99   ezetimibe  (ZETIA ) 10 MG tablet Take 1 tablet (10 mg total) by mouth daily. 90 tablet 3   fexofenadine  (ALLEGRA ) 180 MG tablet 180 mg.     losartan  (COZAAR ) 100 MG tablet Take 1 tablet by mouth once daily 90  tablet 3   memantine  (NAMENDA ) 5 MG tablet Take 1 tablet (5 mg at night) for 2 weeks, then increase to 1 tablet (5 mg) twice a day 60 tablet 11   metoprolol  succinate (TOPROL  XL) 25 MG 24 hr tablet Take 0.5 tablets (12.5 mg total) by mouth at bedtime. 30 tablet 1   Multiple Vitamins-Minerals (VITA-MIN PO) Take 1 tablet by mouth daily at 6 (six) AM. Beef Root Supplement,Pt takes 1000 mg 1 tablet daily.     Multiple Vitamins-Minerals (VITA-MIN PO) Take 1 tablet by mouth daily at 6 (six) AM. Garlic Supplement ,Pt takes 1000 mg 1 tablet daily.      sertraline  (ZOLOFT ) 25 MG tablet Take 1 tablet (25 mg total) by mouth daily. 90 tablet 3   tamsulosin  (FLOMAX ) 0.4 MG CAPS capsule Take 0.4 mg by mouth at bedtime.     Tiotropium Bromide Monohydrate 1.25 MCG/ACT AERS Take by mouth.     vitamin B-12 (CYANOCOBALAMIN ) 100 MCG tablet Take 1 tablet (100 mcg total) by mouth daily. 90 tablet 3   triamcinolone cream (KENALOG) 0.1 % Apply 1 Application topically 2 (two) times daily as needed (dry skin).      No results found for this or any previous visit (from the past 48 hours). No results found.  Review of Systems As per HPI Blood pressure (!) 133/93, pulse 74, temperature 98.6 F (37 C), temperature source Oral, resp. rate 18, height 5' 8 (1.727 m), weight 50.8 kg, SpO2 99%. Physical Exam  Gen: Pleasant, thin gentleman, in no distress,  normal affect  ENT: No lesions,  mouth clear,  oropharynx clear, no postnasal drip  Neck: No JVD, no stridor  Lungs: No use of accessory muscles, distant but no wheezing, no crackles  Cardiovascular: RRR, heart sounds normal, no murmur or gallops, no peripheral edema  Abdomen: soft and NT, no HSM,  BS normal  Musculoskeletal: No deformities, no cyanosis or clubbing  Neuro: alert, awake, non focal  Skin: Warm, no lesions or rash    Assessment/Plan New left lower lobe pulmonary nodule, new mediastinal and hilar adenopathy in a gentleman with a history of left upper lobe adenocarcinoma that has been treated with SBRT.  Discussed the patient's imaging with him today.  Plan will be for navigational bronchoscopy to the left lower lobe nodule and then endobronchial ultrasound to evaluate his adenopathy.  He understands the plan, risks, benefits and elects to proceed.  No barriers identified.  Roberto GORMAN Chris, MD 06/13/2024, 7:19 AM

## 2024-06-13 NOTE — Anesthesia Procedure Notes (Signed)
 Procedure Name: Intubation Date/Time: 06/13/2024 7:39 AM  Performed by: Elby Raelene SAUNDERS, CRNAPre-anesthesia Checklist: Patient identified, Emergency Drugs available, Suction available and Patient being monitored Patient Re-evaluated:Patient Re-evaluated prior to induction Oxygen Delivery Method: Circle System Utilized Preoxygenation: Pre-oxygenation with 100% oxygen Induction Type: IV induction Ventilation: Mask ventilation without difficulty Laryngoscope Size: Miller and 2 Grade View: Grade I Tube type: Oral Tube size: 8.5 mm Number of attempts: 1 Airway Equipment and Method: Stylet Placement Confirmation: ETT inserted through vocal cords under direct vision, positive ETCO2 and breath sounds checked- equal and bilateral Secured at: 21 cm Tube secured with: Tape Dental Injury: Teeth and Oropharynx as per pre-operative assessment

## 2024-06-15 ENCOUNTER — Encounter (HOSPITAL_COMMUNITY): Payer: Self-pay | Admitting: Emergency Medicine

## 2024-06-17 LAB — CYTOLOGY - NON PAP

## 2024-06-20 ENCOUNTER — Encounter: Payer: Self-pay | Admitting: Acute Care

## 2024-06-20 ENCOUNTER — Ambulatory Visit: Admitting: Acute Care

## 2024-06-20 VITALS — BP 134/86 | HR 82 | Temp 98.6°F | Ht 71.0 in | Wt 112.0 lb

## 2024-06-20 DIAGNOSIS — R9389 Abnormal findings on diagnostic imaging of other specified body structures: Secondary | ICD-10-CM

## 2024-06-20 DIAGNOSIS — Z9889 Other specified postprocedural states: Secondary | ICD-10-CM

## 2024-06-20 DIAGNOSIS — C770 Secondary and unspecified malignant neoplasm of lymph nodes of head, face and neck: Secondary | ICD-10-CM | POA: Diagnosis not present

## 2024-06-20 DIAGNOSIS — C3412 Malignant neoplasm of upper lobe, left bronchus or lung: Secondary | ICD-10-CM | POA: Diagnosis not present

## 2024-06-20 DIAGNOSIS — R911 Solitary pulmonary nodule: Secondary | ICD-10-CM

## 2024-06-20 DIAGNOSIS — Z87891 Personal history of nicotine dependence: Secondary | ICD-10-CM

## 2024-06-20 DIAGNOSIS — R942 Abnormal results of pulmonary function studies: Secondary | ICD-10-CM

## 2024-06-20 LAB — CYTOLOGY - NON PAP

## 2024-06-20 NOTE — Progress Notes (Addendum)
 History of Present Illness Roberto Marban. is a 83 y.o. male  former smoker with a 25-pack-year smoking history.  Patient quit smoking in 2025.  Referred to Dr. Shelah by radiation oncology for an abnormal pulmonary nodule for evaluation June 2025 by the [place].  The abnormal lung cancer screening was what prompted the referral.  Synopsis  Roberto Senne. is a 83 y.o. male with history of tobacco use ( 26.3 pack years, quit 06/06/2024)   and newly diagnosed adenocarcinoma of the left upper lobe ( 01/2024) that was treated with SBRT.  Surveillance imaging 04/2024 has revealed a new left lower lobe nodule and mediastinal and hilar adenopathy.  Recommendation was made to achieve a tissue diagnosis using endobronchial ultrasound and robotic assisted navigational bronchoscopy.    06/20/2024 Discussed the use of AI scribe software for clinical note transcription with the patient, who gave verbal consent to proceed.  History of Present Illness Roberto Wallace. is an 83 year old male who presents for follow-up after a recent biopsy and PET scan.  He underwent a CT scan followed by a PET scan on June 01, 2024, which showed areas concerning for metastatic disease. A biopsy was performed on the left lower lobe, and the results showed no malignancy.The cytology of the 11R, 4 R, and 7 node are still pending. These were all hypermetabolic on PET imaging.   He has no pain following the procedure but initially experienced hemoptysis due to  biopsy, which has since self resolved. No fever or worsening shortness of breath is present. No adverse reaction to anesthesia. He uses an albuterol  inhaler occasionally, though he currently has no shortness of breath.  His past medical history includes treatment for colon cancer in 2012 or 2013, which was managed at [facility]. He has a follow-up appointment with his oncologist at the [facility] on June 23, 2024. I am hopeful the cytology results are back at that time.    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      Test Results: Cytology 06/13/2024 A. LUNG, LLL, FINE NEEDLE ASPIRATION  BIOPSY:  - No malignant cells identified  - Scantly cellular   FINAL MICROSCOPIC DIAGNOSIS:  B. LYMPH NODE, 4R, FINE NEEDLE ASPIRATION:  - Positive for malignancy. Adenocarcinoma (see comment).   C. LYMPH NODE, 11R, FINE NEEDLE ASPIRATION:  - No malignant cells identified. Lymphocytes and benign bronchial cells.   D. LYMPH NODE, STATION 7, FINE NEEDLE ASPIRATION:  - Positive for malignancy. Adenocarcinoma (see comment).   06/01/2024 PET New intensely hypermetabolic left hilar adenopathy and enlarged hypermetabolic right lower paratracheal node (2.1 cm, SUV max 16.9), consistent with nodal metastases. 2. New hypermetabolic osseous metastases including L3 vertebral body left femoral neck, right sacral ala (SUV max 14.9), mid thoracic spine, and probable left posterior second rib. 3. Focal intense activity inferior to the lower pole of the left kidney. Consider CT with contrast for further evaluation.  CT Chest 05/17/2024 There is a new solid noncalcified 6 x 8 mm nodule in the subpleural left lower lobe, which is highly concerning for metastasis. 2. There is also significant interval increase in the size of mediastinal and left hilar lymph nodes, compatible with worsening metastatic disease. 3. Multiple other nonacute observations, as described above.   Aortic Atherosclerosis (ICD10-I70.0) and Emphysema (ICD10-J43.9).    Cytology 02/16/2024 FINAL MICROSCOPIC DIAGNOSIS:  A. LUNG, LUL, BRUSHING:  -  Adenocarcinoma   B. LUNG, LUL, FINE NEEDLE ASPIRATION AND BIOPSIES:  - Adenocarcinoma     C. LYMPH NODE, 4L, FINE NEEDLE ASPIRATION:  - No malignant cells identified   D. LYMPH NODE, STATION 7, FINE NEEDLE ASPIRATION:  - No malignant cells  identified      02/09/2024 Super D CT Chest The previous tracer avid lesion within the lateral, subpleural left upper lobe measures 2.1 cm, previously 1.7 cm. This is concerning for primary bronchogenic carcinoma. 2. Additional, non tracer avid scattered lung nodules are identified which are unchanged in the interval. 3. Emphysema and diffuse bronchial wall thickening. 4. Coronary artery calcifications. 5. Aortic Atherosclerosis (ICD10-I70.0) and Emphysema (ICD10-J43.9).     PET scan 01/21/2024 Hypermetabolic 17 mm peripheral left upper lobe lung nodule identified. Not seen on the study of 2024. This worrisome for potential neoplasm the.   Hypermetabolic area along the inferior margin of the left kidney somewhat exophytic. Underlying lesion is possible and could be an aggressive process. Would recommend additional workup with a contrast CT scan of the abdomen and pelvis when appropriate to exclude mass lesion.   4.1 cm in for abdominal aortic aneurysm. Recommend follow-up CT or    Latest Ref Rng & Units 01/14/2024   11:04 AM 12/09/2022    8:55 AM 08/14/2021    1:20 PM  CBC  WBC 4.0 - 10.5 K/uL 5.9  6.4  6.5   Hemoglobin 13.0 - 17.0 g/dL 87.4  86.0  86.2   Hematocrit 39.0 - 52.0 % 38.4  42.5  40.5   Platelets 150.0 - 400.0 K/uL 214.0  222.0  231        Latest Ref Rng & Units 01/14/2024   11:04 AM 11/25/2022   10:55 AM 11/11/2022   10:24 AM  BMP  Glucose 70 - 99 mg/dL 885  90  86   BUN 6 - 23 mg/dL 14  10  6    Creatinine 0.40 - 1.50 mg/dL 8.82  9.10  9.04   BUN/Creat Ratio 10 - 24  11  6    Sodium 135 - 145 mEq/L 139  143  143   Potassium 3.5 - 5.1 mEq/L 4.3  4.3  4.4   Chloride 96 - 112 mEq/L 102  102  102   CO2 19 - 32 mEq/L 32  24  24   Calcium  8.4 - 10.5 mg/dL 9.1  9.6  9.3     BNP No results found for: BNP  ProBNP No results found for: PROBNP  PFT    Component Value Date/Time   FEV1PRE 1.94 11/28/2021 1131   FEV1POST 2.23 11/28/2021 1131   FVCPRE 2.96  11/28/2021 1131   FVCPOST 3.09 11/28/2021 1131   DLCOUNC 19.49 11/28/2021 1131   PREFEV1FVCRT 65 11/28/2021 1131   PSTFEV1FVCRT 72 11/28/2021 1131    DG Chest Port 1 View Result Date: 06/13/2024 EXAM: 1 VIEW XRAY OF THE CHEST 06/13/2024 09:35:12 AM COMPARISON: Comparison to chest radiograph dated 02/16/2024 and PET CT dated 09/14/2023. CLINICAL HISTORY: S/P bronchoscopy with biopsy. FINDINGS: LUNGS AND PLEURA: Mild bibasilar scarring. Fiducial marker again noted in the left upper lobe. Stable scarring in the right upper lung zone. No appreciable focal consolidation. No pulmonary edema. No pleural effusion. No definite pneumothorax. HEART AND MEDIASTINUM: No acute abnormality of the cardiac and mediastinal silhouettes. Aortic atherosclerosis. BONES AND SOFT TISSUES: Remote healed multiple left-sided rib fractures. IMPRESSION: 1. No acute cardiopulmonary findings. 2. Stable scarring in the right upper lung zone and mild bibasilar scarring.  Electronically signed by: Harrietta Sherry MD 06/13/2024 10:43 AM EDT RP Workstation: [id number]   DG C-ARM BRONCHOSCOPY Result Date: 06/13/2024 C-ARM BRONCHOSCOPY: Fluoroscopy was utilized by the requesting physician.  No radiographic interpretation.   NM PET Image Restag (PS) Skull Base To Thigh Result Date: 06/01/2024 EXAM: PET AND CT SKULL BASE TO MID THIGH 06/01/2024 02:36:23 PM TECHNIQUE: RADIOPHARMACEUTICAL: 5.56 mCi F-18 FDG Uptake time 60 minutes. Glucose level 78 mg/dl. PET imaging was acquired from the base of the skull to the mid thighs. Non-contrast enhanced computed tomography was obtained for attenuation correction and anatomic localization. COMPARISON: CT 05/17/2024, PET CT 01/21/2024 CLINICAL HISTORY: Non-small cell lung cancer (NSCLC), staging. ; NEW LLL NODULE LOOKING TO BE BX IN THE COMING WEEKS. FINDINGS: HEAD AND NECK: No metabolically active cervical lymphadenopathy. CHEST: The nodule of concern along the lateral aspect of the left upper lobe at  the SBRT radiation treatment site was only mildly metabolically active with SUV max equal 3.1, a decrease from SUV max equal 6.9. There is new intensely hypermetabolic left hilar adenopathy with SUV max equal 9.5 on imaging 74. New enlarged hypermetabolic right lower paratracheal node measuring 2.1 cm with SUV max equal 16.9. Probable left posterior second rib metastatic lesion on image 52. ABDOMEN AND PELVIS: No metabolically active lymphadenopathy. Along the inferior margin of the lower pole of the left kidney there is a 2 cm focus of intense tracer activity with SUV max equal 12.6. This activity is similar to excreted urine within the left renal pelvis. This potentially could represent a diverticulum of the lower pole calyx. Physiologic activity within the gastrointestinal and genitourinary systems. BONES AND SOFT TISSUE: There is a new focus of intense metabolic activity within the L3 vertebral body with SUV max equal 15.6 on image 130. Subtle sclerotic lesion on CT measures 14 mm. Additionally there is a new skeletal lesion in the left femoral neck on image 180. Lesion within the Right Sacral Ala with SUV max equal 14.9 on image 149. Hypermetabolic lesion in the mid thoracic spine. IMPRESSION: 1. New intensely hypermetabolic left hilar adenopathy and enlarged hypermetabolic right lower paratracheal node (2.1 cm, SUV max 16.9), consistent with nodal metastases. 2. New hypermetabolic osseous metastases including L3 vertebral body left femoral neck, right sacral ala (SUV max 14.9), mid thoracic spine, and probable left posterior second rib. 3. Focal intense activity inferior to the lower pole of the left kidney. Consider CT with contrast for further evaluation. Electronically signed by: Norleen Boxer MD 06/01/2024 03:19 PM EDT RP Workstation: [id number]   MR Brain W Wo Contrast Result Date: 06/01/2024 EXAM: MRI BRAIN WITH AND WITHOUT CONTRAST 06/01/2024 11:47:25 AM TECHNIQUE: Multiplanar multisequence MRI of the  head/brain was performed with and without the administration of 5 mL of gadobutrol (GADAVIST) 1 MMOL/ML injection. COMPARISON: Brain MRI 01/03/2023. CLINICAL HISTORY: 83 year old male. Non-small cell lung cancer (NSCLC), staging. Malignant neoplasm of unspecified part of unspecified bronchus or lung. FINDINGS: BRAIN AND VENTRICLES: No acute intracranial hemorrhage. No mass effect or midline shift. No hydrocephalus. The sella is unremarkable. Normal flow voids. No dural thickening. Following contrast, no abnormal enhancement and the major dural venous sinuses are enhancing and appear to be patent. Nonenhancing focus of abnormal diffusion signal in the left superior cerebellum on series 5 image 17, not apparent on the coronal DWI, but with punctate associated T2 and FLAIR hyperintensity (series 8 image 10). No regional edema or mass effect. No other abnormal diffusion identified. Cerebral volume is stable from last year. Patchy,  scattered, and confluent periventricular white matter T2 and FLAIR hyperintensities are stable and moderate for age. Subtle chronic cortical and subtle malacia are suspected on series 9 image 41. There are occasional chronic microhemorrhages in the brain, including in the left occipital lobe (stable on series 10 image 35). There are small chronic lacunar infarcts bilaterally in the caudate and left thalamus. ORBITS: No acute abnormality. SINUSES: No acute abnormality. BONES AND SOFT TISSUES: Normal bone marrow signal and enhancement. No acute soft tissue abnormality. Chronic cervical spine degeneration. IMPRESSION: 1. Punctate lesion in the left superior cerebellum without enhancement, edema or mass effect is favored to be a subacute small vessel infarct (see #3). Repeat Brain MRI without and with contrast in 2 to 3 months recommended to confirm. 2. Otherwise No metastatic disease or acute intracranial abnormality identified. 3. Chronic small vessel disease which is otherwise stable.  Electronically signed by: Helayne Hurst MD 06/01/2024 12:01 PM EDT RP Workstation: [id number]     Past medical hx Past Medical History:  Diagnosis Date   Aneurysm    per pt,in head   Arthritis    Chicken pox    Coronary artery disease    Depression    Hypercholesteremia    Hypertension    Migraine    Peripheral vascular disease    PTSD (post-traumatic stress disorder) 06/24/2017   Stroke (HCC)    No residual effects   Syphilis    Tuberculosis    Vitamin D  deficiency      Social History   Tobacco Use   Smoking status: Former    Current packs/day: 0.50    Average packs/day: 0.5 packs/day for 52.6 years (26.3 ttl pk-yrs)    Types: Cigarettes    Start date: 10/30/1971   Smokeless tobacco: Never   Tobacco comments:    Quit smoking 2 weeks ago (10/13) Verified 10/27 Marcus HERO   Vaping Use   Vaping status: Never Used  Substance Use Topics   Alcohol use: Not Currently    Alcohol/week: 2.0 - 3.0 standard drinks of alcohol    Types: 2 - 3 Shots of liquor per week   Drug use: No    Mr.Quakenbush reports that he has quit smoking. His smoking use included cigarettes. He started smoking about 52 years ago. He has a 26.3 pack-year smoking history. He has never used smokeless tobacco. He reports that he does not currently use alcohol after a past usage of about 2.0 - 3.0 standard drinks of alcohol per week. He reports that he does not use drugs.  Tobacco Cessation: Counseling given: Not Answered Tobacco comments: Quit smoking 2 weeks ago (10/13) Verified 10/27 Marcus HERO  Recently quit smoker, last cigarette 10/13/.2025  Past surgical hx, Family hx, Social hx all reviewed.  Current Outpatient Medications on File Prior to Visit  Medication Sig   Apoaequorin (PREVAGEN) 10 MG CAPS Take 1 tablet by mouth daily.   aspirin  EC 81 MG tablet Take 1 tablet (81 mg total) by mouth daily. Swallow whole.   atorvastatin  (LIPITOR ) 80 MG tablet Take 1 tablet (80 mg total) by mouth daily.    Cholecalciferol (THERA-D 2000) 50 MCG (2000 UT) TABS 1 tab by mouth once daily   ezetimibe  (ZETIA ) 10 MG tablet Take 1 tablet (10 mg total) by mouth daily.   fexofenadine  (ALLEGRA ) 180 MG tablet 180 mg.   losartan  (COZAAR ) 100 MG tablet Take 1 tablet by mouth once daily   memantine  (NAMENDA ) 5 MG tablet Take 1 tablet (5 mg at night)  for 2 weeks, then increase to 1 tablet (5 mg) twice a day   metoprolol  succinate (TOPROL  XL) 25 MG 24 hr tablet Take 0.5 tablets (12.5 mg total) by mouth at bedtime.   Multiple Vitamins-Minerals (VITA-MIN PO) Take 1 tablet by mouth daily at 6 (six) AM. Beef Root Supplement,Pt takes 1000 mg 1 tablet daily.   Multiple Vitamins-Minerals (VITA-MIN PO) Take 1 tablet by mouth daily at 6 (six) AM. Garlic Supplement ,Pt takes 1000 mg 1 tablet daily.   sertraline  (ZOLOFT ) 25 MG tablet Take 1 tablet (25 mg total) by mouth daily.   tamsulosin  (FLOMAX ) 0.4 MG CAPS capsule Take 0.4 mg by mouth at bedtime.   Tiotropium Bromide 1.25 MCG/ACT AERS Take 2 puffs by mouth once.   Tiotropium Bromide Monohydrate 1.25 MCG/ACT AERS Take by mouth.   triamcinolone cream (KENALOG) 0.1 % Apply 1 Application topically 2 (two) times daily as needed (dry skin).   vitamin B-12 (CYANOCOBALAMIN ) 100 MCG tablet Take 1 tablet (100 mcg total) by mouth daily.   No current facility-administered medications on file prior to visit.     Allergies  Allergen Reactions   Amlodipine Rash   Brinzolamide-Brimonidine Other (See Comments)   Pollen Extract Other (See Comments)    Other reaction(s): Sinusitis    Review Of Systems:  Constitutional:   + weight loss, No night sweats,  Fevers, chills, + fatigue, or  lassitude.  HEENT:   No headaches,  Difficulty swallowing,  Tooth/dental problems, or  Sore throat,                No sneezing, itching, ear ache, nasal congestion, post nasal drip,   CV:  No chest pain,  Orthopnea, PND, swelling in lower extremities, anasarca, dizziness, palpitations, syncope.    GI  No heartburn, indigestion, abdominal pain, nausea, vomiting, diarrhea, change in bowel habits, loss of appetite, bloody stools.   Resp: +shortness of breath with exertion none  at rest.  No excess mucus, no productive cough,  No non-productive cough,  No coughing up of blood.  No change in color of mucus.  No wheezing.  No chest wall deformity  Skin: no rash or lesions.  GU: no dysuria, change in color of urine, no urgency or frequency.  No flank pain, no hematuria   MS:  No joint pain or swelling.  No decreased range of motion.  No back pain.  Psych:  No change in mood or affect. No depression or anxiety.  No memory loss.   Vital Signs BP (!) 131/91   Pulse 82   Temp 98.6 F (37 C)   Ht 5' 11 (1.803 m)   Wt 112 lb (50.8 kg)   SpO2 98%   BMI 15.62 kg/m     Physical Exam GENERAL: No distress, alert and oriented times 3. Frail gentleman in a wheelchair EARS NOSE THROAT: No sinus tenderness, tympanic membranes clear, pale nasal mucosa, no oral exudate, no post nasal drip, no lymphadenopathy. CHEST: Lungs clear to auscultation bilaterally. No wheeze, rales, dullness, no accessory muscle use, no nasal flaring, no sternal retractions. CARDIAC: S1, S2, regular rate and rhythm, no murmur. ABDOMINAL: Soft, non tender. ND, BS present.Body mass index is 15.62 kg/m.  EXTREMITIES: No clubbing, cyanosis, edema, or ankle swelling. No obvious deformities. NEUROLOGICAL: Physically deconditioned,  Alert and oriented x 3, MAE x 4. SKIN: No rashes, warm and dry. No obvious skin lesions. PSYCHIATRIC: Normal mood and behavior.   Assessment/Plan  Assessment and Plan Assessment & Plan Left lower  lobe Pulmonary nodule Previous history of adenocarcinoma of the lung 01/2024. Post radiation treatment Post bronchoscopy with biopsies >> did well post procedure PET scan on June 01, 2024, showed  concerning activity.  Post-procedural hemoptysis resolved. LLL Biopsy negative for malignant  cells Plan - Await pending biopsy results.(11R, 4R, and 7 nodes)>> we will call with results once available - Communicate biopsy results to Mr. Brunton  once available. - Scheduled follow-up with oncologist at the [facility] on June 23, 2024. - Ensure follow-up with oncologist at the [facility] on June 23, 2024 for treatment evaluation if pending tissue sampling is positive.  AVS 06/20/2024 We have reviewed your cytology report.  The left lower lobe nodule was not malignant, which is reassuring. We are still waiting on the results from the lymph nodes. We are calling the cytology department to see if we can get a result. As soon as I know the results , I will let you know. If they are positive for malignant disease, you will need treatment . Follow up with the [facility] oncologist to see if they prefer you have treatment in [place], or there at the [place].  Call us  if you need us . Please contact office for sooner follow up if symptoms do not improve or worsen or seek emergency care     I spent 25 minutes dedicated to the care of this patient on the date of this encounter to include pre-visit review of records, face-to-face time with the patient discussing conditions above, post visit ordering of testing, clinical documentation with the electronic health record, making appropriate referrals as documented, and communicating necessary information to the patient's healthcare team.     Lauraine JULIANNA Lites, NP 06/20/2024  9:05 AM   Addendum 06/20/2024 at 1:12 pm I have called the patient with the final results of the 1 R and 7 lymph nodes. They were positive for adenocarcinoma. He has follow up with oncology 06/23/2024 at [facility]. . I have asked him to call back if he needs anything, or has any questions.He verbalized understanding.

## 2024-06-20 NOTE — Patient Instructions (Signed)
 It is good to see you today. We have reviewed your cytology report.  The left lower lobe nodule was not malignant, which is reassuring. We are still waiting on the results from the lymph nodes. We are calling the cytology department to see if we can get a result. As soon as I know the results , I will let you know. If they are positive for malignant disease, you will need treatment . Follow up with the [facility] oncologist to see if they prefer you have treatment in [place], or there at the [place].  Call us  if you need us . Please contact office for sooner follow up if symptoms do not improve or worsen or seek emergency care

## 2024-07-01 ENCOUNTER — Emergency Department (HOSPITAL_COMMUNITY)

## 2024-07-01 ENCOUNTER — Encounter (HOSPITAL_COMMUNITY): Payer: Self-pay | Admitting: *Deleted

## 2024-07-01 ENCOUNTER — Other Ambulatory Visit: Payer: Self-pay

## 2024-07-01 ENCOUNTER — Inpatient Hospital Stay (HOSPITAL_COMMUNITY)
Admission: EM | Admit: 2024-07-01 | Discharge: 2024-07-04 | DRG: 543 | Disposition: A | Discharge status: Home / Self Care | Attending: Internal Medicine | Admitting: Internal Medicine

## 2024-07-01 DIAGNOSIS — Z888 Allergy status to other drugs, medicaments and biological substances status: Secondary | ICD-10-CM | POA: Diagnosis not present

## 2024-07-01 DIAGNOSIS — N400 Enlarged prostate without lower urinary tract symptoms: Secondary | ICD-10-CM | POA: Diagnosis present

## 2024-07-01 DIAGNOSIS — C642 Malignant neoplasm of left kidney, except renal pelvis: Secondary | ICD-10-CM | POA: Diagnosis present

## 2024-07-01 DIAGNOSIS — I2510 Atherosclerotic heart disease of native coronary artery without angina pectoris: Secondary | ICD-10-CM | POA: Diagnosis present

## 2024-07-01 DIAGNOSIS — C7951 Secondary malignant neoplasm of bone: Secondary | ICD-10-CM | POA: Diagnosis present

## 2024-07-01 DIAGNOSIS — I10 Essential (primary) hypertension: Secondary | ICD-10-CM | POA: Diagnosis present

## 2024-07-01 DIAGNOSIS — Z8042 Family history of malignant neoplasm of prostate: Secondary | ICD-10-CM

## 2024-07-01 DIAGNOSIS — Z820 Family history of epilepsy and other diseases of the nervous system: Secondary | ICD-10-CM

## 2024-07-01 DIAGNOSIS — Z9221 Personal history of antineoplastic chemotherapy: Secondary | ICD-10-CM

## 2024-07-01 DIAGNOSIS — I7143 Infrarenal abdominal aortic aneurysm, without rupture: Secondary | ICD-10-CM | POA: Diagnosis present

## 2024-07-01 DIAGNOSIS — Z79891 Long term (current) use of opiate analgesic: Secondary | ICD-10-CM

## 2024-07-01 DIAGNOSIS — Z923 Personal history of irradiation: Secondary | ICD-10-CM

## 2024-07-01 DIAGNOSIS — E7800 Pure hypercholesterolemia, unspecified: Secondary | ICD-10-CM | POA: Diagnosis present

## 2024-07-01 DIAGNOSIS — I739 Peripheral vascular disease, unspecified: Secondary | ICD-10-CM | POA: Diagnosis present

## 2024-07-01 DIAGNOSIS — C3412 Malignant neoplasm of upper lobe, left bronchus or lung: Secondary | ICD-10-CM | POA: Diagnosis present

## 2024-07-01 DIAGNOSIS — Z8673 Personal history of transient ischemic attack (TIA), and cerebral infarction without residual deficits: Secondary | ICD-10-CM

## 2024-07-01 DIAGNOSIS — F4310 Post-traumatic stress disorder, unspecified: Secondary | ICD-10-CM | POA: Diagnosis present

## 2024-07-01 DIAGNOSIS — M8458XA Pathological fracture in neoplastic disease, other specified site, initial encounter for fracture: Principal | ICD-10-CM | POA: Diagnosis present

## 2024-07-01 DIAGNOSIS — M898X9 Other specified disorders of bone, unspecified site: Secondary | ICD-10-CM | POA: Diagnosis not present

## 2024-07-01 DIAGNOSIS — Z91048 Other nonmedicinal substance allergy status: Secondary | ICD-10-CM

## 2024-07-01 DIAGNOSIS — Z79899 Other long term (current) drug therapy: Secondary | ICD-10-CM

## 2024-07-01 DIAGNOSIS — Z7982 Long term (current) use of aspirin: Secondary | ICD-10-CM | POA: Diagnosis not present

## 2024-07-01 DIAGNOSIS — Z87891 Personal history of nicotine dependence: Secondary | ICD-10-CM

## 2024-07-01 HISTORY — DX: Malignant (primary) neoplasm, unspecified: C80.1

## 2024-07-01 LAB — URINALYSIS, ROUTINE W REFLEX MICROSCOPIC
Bacteria, UA: NONE SEEN
Bilirubin Urine: NEGATIVE
Glucose, UA: NEGATIVE mg/dL
Ketones, ur: NEGATIVE mg/dL
Leukocytes,Ua: NEGATIVE
Nitrite: NEGATIVE
Protein, ur: NEGATIVE mg/dL
Specific Gravity, Urine: 1.008 (ref 1.005–1.030)
pH: 6 (ref 5.0–8.0)

## 2024-07-01 LAB — COMPREHENSIVE METABOLIC PANEL WITH GFR
ALT: 10 U/L (ref 0–44)
AST: 21 U/L (ref 15–41)
Albumin: 3.9 g/dL (ref 3.5–5.0)
Alkaline Phosphatase: 76 U/L (ref 38–126)
Anion gap: 14 (ref 5–15)
BUN: 8 mg/dL (ref 8–23)
CO2: 23 mmol/L (ref 22–32)
Calcium: 9.1 mg/dL (ref 8.9–10.3)
Chloride: 98 mmol/L (ref 98–111)
Creatinine, Ser: 1.19 mg/dL (ref 0.61–1.24)
GFR, Estimated: >60 mL/min (ref >60)
Glucose, Bld: 114 mg/dL — ABNORMAL HIGH (ref 70–99)
Potassium: 4.1 mmol/L (ref 3.5–5.1)
Sodium: 135 mmol/L (ref 135–145)
Total Bilirubin: 0.7 mg/dL (ref 0.0–1.2)
Total Protein: 8 g/dL (ref 6.5–8.1)

## 2024-07-01 LAB — CBC
HCT: 40.9 % (ref 39.0–52.0)
Hemoglobin: 12.9 g/dL — ABNORMAL LOW (ref 13.0–17.0)
MCH: 27.7 pg (ref 26.0–34.0)
MCHC: 31.5 g/dL (ref 30.0–36.0)
MCV: 87.8 fL (ref 80.0–100.0)
Platelets: 208 K/uL (ref 150–400)
RBC: 4.66 MIL/uL (ref 4.22–5.81)
RDW: 12.6 % (ref 11.5–15.5)
WBC: 5.5 K/uL (ref 4.0–10.5)
nRBC: 0 % (ref 0.0–0.2)

## 2024-07-01 LAB — VITAMIN D 25 HYDROXY (VIT D DEFICIENCY, FRACTURES): Vit D, 25-Hydroxy: 53.21 ng/mL (ref 30–100)

## 2024-07-01 LAB — TROPONIN I (HIGH SENSITIVITY): Troponin I (High Sensitivity): 5 ng/L (ref <18)

## 2024-07-01 LAB — LIPASE, BLOOD: Lipase: 22 U/L (ref 11–51)

## 2024-07-01 MED ORDER — EZETIMIBE 10 MG PO TABS
10.0000 mg | ORAL_TABLET | Freq: Every day | ORAL | Status: DC
  Administered 2024-07-02 – 2024-07-04 (×3): 10 mg via ORAL
  Filled 2024-07-01 (×3): qty 1

## 2024-07-01 MED ORDER — MORPHINE SULFATE (PF) 4 MG/ML IV SOLN
4.0000 mg | Freq: Once | INTRAVENOUS | Status: AC
  Administered 2024-07-01: 4 mg via INTRAVENOUS
  Filled 2024-07-01: qty 1

## 2024-07-01 MED ORDER — TAMSULOSIN HCL 0.4 MG PO CAPS
0.4000 mg | ORAL_CAPSULE | Freq: Every day | ORAL | Status: DC
  Administered 2024-07-02 – 2024-07-04 (×3): 0.4 mg via ORAL
  Filled 2024-07-01 (×3): qty 1

## 2024-07-01 MED ORDER — SERTRALINE HCL 25 MG PO TABS
25.0000 mg | ORAL_TABLET | Freq: Every day | ORAL | Status: DC
  Administered 2024-07-02 – 2024-07-04 (×3): 25 mg via ORAL
  Filled 2024-07-01 (×3): qty 1

## 2024-07-01 MED ORDER — LOSARTAN POTASSIUM 50 MG PO TABS
100.0000 mg | ORAL_TABLET | Freq: Every day | ORAL | Status: DC
  Administered 2024-07-02 – 2024-07-04 (×3): 100 mg via ORAL
  Filled 2024-07-01 (×3): qty 2

## 2024-07-01 MED ORDER — IPRATROPIUM-ALBUTEROL 0.5-2.5 (3) MG/3ML IN SOLN
3.0000 mL | Freq: Four times a day (QID) | RESPIRATORY_TRACT | Status: DC | PRN
Start: 2024-07-01 — End: 2024-07-04

## 2024-07-01 MED ORDER — ASPIRIN 81 MG PO TBEC
81.0000 mg | DELAYED_RELEASE_TABLET | Freq: Every day | ORAL | Status: DC
  Administered 2024-07-01 – 2024-07-04 (×4): 81 mg via ORAL
  Filled 2024-07-01 (×4): qty 1

## 2024-07-01 MED ORDER — METHOCARBAMOL 500 MG PO TABS
500.0000 mg | ORAL_TABLET | Freq: Three times a day (TID) | ORAL | Status: DC
  Administered 2024-07-01 – 2024-07-04 (×10): 500 mg via ORAL
  Filled 2024-07-01 (×10): qty 1

## 2024-07-01 MED ORDER — IOHEXOL 350 MG/ML SOLN
75.0000 mL | Freq: Once | INTRAVENOUS | Status: AC | PRN
  Administered 2024-07-01: 75 mL via INTRAVENOUS

## 2024-07-01 MED ORDER — MEMANTINE HCL 10 MG PO TABS
5.0000 mg | ORAL_TABLET | Freq: Two times a day (BID) | ORAL | Status: DC
  Administered 2024-07-02 – 2024-07-04 (×5): 5 mg via ORAL
  Filled 2024-07-01 (×8): qty 0.5

## 2024-07-01 MED ORDER — METOPROLOL SUCCINATE ER 25 MG PO TB24
12.5000 mg | ORAL_TABLET | Freq: Every day | ORAL | Status: DC
  Administered 2024-07-01 – 2024-07-02 (×2): 12.5 mg via ORAL
  Filled 2024-07-01 (×2): qty 1

## 2024-07-01 MED ORDER — ACETAMINOPHEN 500 MG PO TABS
1000.0000 mg | ORAL_TABLET | Freq: Three times a day (TID) | ORAL | Status: DC
  Administered 2024-07-01 – 2024-07-04 (×10): 1000 mg via ORAL
  Filled 2024-07-01 (×10): qty 2

## 2024-07-01 MED ORDER — MORPHINE SULFATE (PF) 2 MG/ML IV SOLN
2.0000 mg | INTRAVENOUS | Status: DC | PRN
  Administered 2024-07-01 – 2024-07-02 (×3): 2 mg via INTRAVENOUS
  Filled 2024-07-01 (×3): qty 1

## 2024-07-01 NOTE — ED Triage Notes (Addendum)
 Pt reports the for about a month he has had right lower back pain, right lower abdominal pain and right upper leg. He says he feels like he has some swelling in his right groin area. He says that he has felt dizzy since waking up, also says he feels like he is breathing more heavy than normal. (Hx of metastatic lung cancer-last chemo end of October )

## 2024-07-01 NOTE — H&P (Addendum)
 History and Physical    Patient: Roberto Wallace FMW:[id number] DOB: 06/15/1941 DOA: 07/01/2024 DOS: the patient was seen and examined on 07/01/2024 PCP: Norleen Lynwood ORN, MD  Patient coming from: Home  Chief Complaint:  Chief Complaint  Patient presents with   Back Pain   Abdominal Pain   HPI: Roberto Wallace. is a 83 y.o. male with medical history significant of CVA, PTSD, HTN, HLD, PVD, and metastatic lung cancer (dx 01/2024; s/p SBRT; new LUL lesion and adenopathy in 05/2024; last chemotherapy at [facility] on 10/27) p/w back pain and BLE weakness and found to have lytic lesions of spine, new renal mass c/f RCC,and enlarged AAA.  The patient presented with back pain that had been ongoing for approximately two months. The pain was located in the lower back. The patient had been diagnosed with lung cancer and was receiving treatment through the [facility]. The most recent treatment occurred a week ago, and it was the patient's first session. The pain severity was reported to be a 6/10 at the time of presentation. The patient was unable to get up and perform daily activities effectively due to the pain; as such, he presented to the ED for evaluation.  In the ED, pt AFVSS. Labs unremarkable. CT abd/pelvis showed infrarenal abdominal aortic aneurysm measuring up to 4.6 cm is enlarged since 2017, 2.4 cm solid exophytic mass at the inferior pole of the left kidney, highly suspicious for renal cell carcinoma (new since 2017), and superior L3 vertebral body lytic lesion with mild endplate compression compatible with mild pathologic fracture, as well as indeterminate mild L4 compression fracture, which may be chronic and benign. EDP consulted medicine and requested admission.   Review of Systems: As mentioned in the history of present illness. All other systems reviewed and are negative. Past Medical History:  Diagnosis Date   Aneurysm    per pt,in head   Arthritis    Cancer (HCC)    Chicken  pox    Coronary artery disease    Depression    Hypercholesteremia    Hypertension    Migraine    Peripheral vascular disease    PTSD (post-traumatic stress disorder) 06/24/2017   Stroke (HCC)    No residual effects   Syphilis    Tuberculosis    Vitamin D  deficiency    Past Surgical History:  Procedure Laterality Date   BUBBLE STUDY  08/15/2021   Procedure: BUBBLE STUDY;  Surgeon: Jeffrie Oneil BROCKS, MD;  Location: [facility] ENDOSCOPY;  Service: Cardiovascular;;   COLONOSCOPY     ENDOBRONCHIAL ULTRASOUND Left 02/16/2024   Procedure: ENDOBRONCHIAL ULTRASOUND (EBUS);  Surgeon: Shelah Lamar RAMAN, MD;  Location: [facility] ENDOSCOPY;  Service: Pulmonary;  Laterality: Left;   HERNIA REPAIR Right    x 2   TEE WITHOUT CARDIOVERSION N/A 08/15/2021   Procedure: TRANSESOPHAGEAL ECHOCARDIOGRAM (TEE);  Surgeon: Jeffrie Oneil BROCKS, MD;  Location: [facility] ENDOSCOPY;  Service: Cardiovascular;  Laterality: N/A;   VIDEO BRONCHOSCOPY WITH ENDOBRONCHIAL NAVIGATION Left 02/16/2024   Procedure: VIDEO BRONCHOSCOPY WITH ENDOBRONCHIAL NAVIGATION;  Surgeon: Shelah Lamar RAMAN, MD;  Location: [facility] ENDOSCOPY;  Service: Pulmonary;  Laterality: Left;   VIDEO BRONCHOSCOPY WITH ENDOBRONCHIAL NAVIGATION Left 06/13/2024   Procedure: VIDEO BRONCHOSCOPY WITH ENDOBRONCHIAL NAVIGATION;  Surgeon: Shelah Lamar RAMAN, MD;  Location: [facility] ENDOSCOPY;  Service: Pulmonary;  Laterality: Left;  Needs EBUS scope also   VIDEO BRONCHOSCOPY WITH ENDOBRONCHIAL ULTRASOUND N/A 06/13/2024   Procedure: BRONCHOSCOPY, WITH EBUS;  Surgeon: Shelah Lamar RAMAN, MD;  Location:  [facility] ENDOSCOPY;  Service: Pulmonary;  Laterality: N/A;   Social History:  reports that he has quit smoking. His smoking use included cigarettes. He started smoking about 52 years ago. He has a 26.3 pack-year smoking history. He has never used smokeless tobacco. He reports that he does not currently use alcohol after a past usage of about 2.0 - 3.0 standard drinks of alcohol per week. He reports that he does not use  drugs.  Allergies  Allergen Reactions   Amlodipine Rash   Brinzolamide-Brimonidine Other (See Comments)    Not allergic per pt.    Pollen Extract Other (See Comments)    Sinusitis    Family History  Problem Relation Age of Onset   Dementia Mother    Healthy Father    Prostate cancer Brother     Prior to Admission medications   Medication Sig Start Date End Date Taking? Authorizing Provider  aspirin  EC 81 MG tablet Take 1 tablet (81 mg total) by mouth daily. Swallow whole. 05/07/22  Yes Norleen Lynwood ORN, MD  atorvastatin  (LIPITOR ) 80 MG tablet Take 1 tablet (80 mg total) by mouth daily. Patient not taking: Reported on 07/01/2024 06/26/23   Thukkani, Arun K, MD  ezetimibe  (ZETIA ) 10 MG tablet Take 1 tablet (10 mg total) by mouth daily. 09/09/23   Thukkani, Arun K, MD  fexofenadine  (ALLEGRA ) 180 MG tablet 180 mg. 09/10/21   [provider]  losartan  (COZAAR ) 100 MG tablet Take 1 tablet by mouth once daily 03/08/24   Dick, Ernest H Jr., NP  memantine  (NAMENDA ) 5 MG tablet Take 1 tablet (5 mg at night) for 2 weeks, then increase to 1 tablet (5 mg) twice a day 12/09/22   Wertman, Sara E, PA-C  metoprolol  succinate (TOPROL  XL) 25 MG 24 hr tablet Take 0.5 tablets (12.5 mg total) by mouth at bedtime. 11/11/22   Wyn Jackee VEAR Mickey., NP  Multiple Vitamins-Minerals (VITA-MIN PO) Take 1 tablet by mouth daily at 6 (six) AM. Beef Root Supplement,Pt takes 1000 mg 1 tablet daily.    [provider]  Multiple Vitamins-Minerals (VITA-MIN PO) Take 1 tablet by mouth daily at 6 (six) AM. Garlic Supplement ,Pt takes 1000 mg 1 tablet daily.    [provider]  sertraline  (ZOLOFT ) 25 MG tablet Take 1 tablet (25 mg total) by mouth daily. 06/03/21   Norleen Lynwood ORN, MD  tamsulosin  (FLOMAX ) 0.4 MG CAPS capsule Take 0.4 mg by mouth at bedtime. 09/15/22   [provider]  Tiotropium Bromide 1.25 MCG/ACT AERS Take 2 puffs by mouth once. 11/18/23   [provider]  Tiotropium Bromide  Monohydrate 1.25 MCG/ACT AERS Take by mouth. 11/18/23   [provider]  triamcinolone cream (KENALOG) 0.1 % Apply 1 Application topically 2 (two) times daily as needed (dry skin).    [provider]  vitamin B-12 (CYANOCOBALAMIN ) 100 MCG tablet Take 1 tablet (100 mcg total) by mouth daily. 12/13/20   Norleen Lynwood ORN, MD    Physical Exam: Vitals:   07/01/24 0458 07/01/24 0819 07/01/24 0831  BP: 128/85  (!) 143/97  Pulse: 96  82  Resp: (!) 24  20  Temp: 97.6 F (36.4 C)  98.2 F (36.8 C)  TempSrc:   Oral  SpO2: 100% 100% 100%   General: Alert, oriented x3, resting comfortably in no acute distress Respiratory: Lungs clear to auscultation bilaterally with normal respiratory effort; no w/r/r Cardiovascular: Regular rate and rhythm w/o m/r/g   Data Reviewed:  Lab Results  Component Value Date   WBC 5.5 07/01/2024   HGB 12.9 (L) 07/01/2024   HCT 40.9 07/01/2024   MCV 87.8 07/01/2024   PLT 208 07/01/2024   Lab Results  Component Value Date   GLUCOSE 114 (H) 07/01/2024   CALCIUM  9.1 07/01/2024   NA 135 07/01/2024   K 4.1 07/01/2024   CO2 23 07/01/2024   CL 98 07/01/2024   BUN 8 07/01/2024   CREATININE 1.19 07/01/2024   Lab Results  Component Value Date   ALT 10 07/01/2024   AST 21 07/01/2024   ALKPHOS 76 07/01/2024   BILITOT 0.7 07/01/2024   Lab Results  Component Value Date   INR 0.99 10/16/2017   INR 1.07 01/09/2015   Radiology: DG Chest Portable 1 View Result Date: 07/01/2024 EXAM: 1 VIEW XRAY OF THE CHEST 07/01/2024 08:11:00 AM COMPARISON: Comparison 06/13/2024. CLINICAL HISTORY: SOB FINDINGS: LUNGS AND PLEURA: Stable bilateral lung scarring. No focal pulmonary opacity. No pulmonary edema. No pleural effusion. No pneumothorax. HEART AND MEDIASTINUM: No acute abnormality of the cardiac and mediastinal silhouettes. BONES AND SOFT TISSUES: Old left rib fractures are noted. IMPRESSION: 1. Stable bilateral lung scarring. 2. Old left rib fractures.  Electronically signed by: Lynwood Seip MD 07/01/2024 08:27 AM EST RP Workstation: [id number]   CT T-SPINE NO CHARGE Result Date: 07/01/2024 EXAM: CT THORACIC SPINE WITHOUT CONTRAST 07/01/2024 07:50:51 AM TECHNIQUE: CT of the thoracic spine was performed without the administration of intravenous contrast. Multiplanar reformatted images are provided for review. Automated exposure control, iterative reconstruction, and/or weight based adjustment of the mA/kV was utilized to reduce the radiation dose to as low as reasonably achievable. COMPARISON: CTA chest today reported separately. CLINICAL HISTORY: 83 year old male with lung cancer. FINDINGS: BONES AND ALIGNMENT: Normal thoracic segmentation. Maintained thoracic kyphosis. Minimal thoracic scoliosis. No spondylolisthesis. T6 lytic vertebral body lesion is up to 15 mm diameter, with evidence of early ventral epidural tumor extension on series 6 image 35. Maintained T6 vertebral body height. Indistinct T9 superior vertebral body lucency (series 2 image 93) with associated mild superior endplate compression fracture suspicious for pathologic fracture. T9 < 20% loss of vertebral body height. No retropulsion. No other discrete thoracic vertebral lesion by CT. SOFT TISSUES: Abnormal mediastinum and other thoracic viscera are reported separately today. IMPRESSION: 1. T6 lytic vertebral body lesion (up to 15 mm) with early ventral epidural tumor extension; maintained T6 vertebral body height. 2. Indistinct T9 superior vertebral body lucency with mild superior endplate compression compatible with pathologic fracture. No retropulsion. 3. Abnormal Chest CTA today reported separately. Electronically signed by: Helayne Hurst MD 07/01/2024 08:21 AM EST RP Workstation: [id number]   CT L-SPINE NO CHARGE Result Date: 07/01/2024 EXAM: CT OF THE LUMBAR SPINE WITHOUT CONTRAST 07/01/2024 07:50:51 AM TECHNIQUE: CT of the lumbar spine was performed without the administration of  intravenous contrast. Multiplanar reformatted images are provided for review. Automated exposure control, iterative reconstruction, and/or weight based adjustment of the mA/kV was utilized to reduce the radiation dose to as low as reasonably achievable. COMPARISON: CT abdomen and pelvis 07/01/2024 07:50:51 AM, reported separately. CLINICAL HISTORY: 83 year old male with lung cancer. FINDINGS: BONES AND ALIGNMENT: Normal lumbar segmentation. L3 superior vertebral body roughly 1.5 cm lucent or lytic lesion with superior endplate compression is suspicious for metastasis with pathologic fracture. L3 12% loss of vertebral body height. No retropulsion. Indeterminate, possibly chronic and benign L4 superior endplate compression fracture. L4 21% loss of vertebral body height. No retropulsion. No other bone metastases. SOFT TISSUES: Abdominal  and pelvic viscera are reported separately today. IMPRESSION: 1. L3 superior vertebral body lytic lesion with superior endplate compression, suspicious for metastasis and pathologic fracture (12% loss of height, No retropulsion) 2. Indeterminate L4 superior endplate compression fracture, possibly chronic and benign (21% loss of height, No retropulsion). Electronically signed by: Helayne Hurst MD 07/01/2024 08:17 AM EST RP Workstation: [id number]   CT ABDOMEN PELVIS W CONTRAST Result Date: 07/01/2024 EXAM: CT ABDOMEN AND PELVIS WITH CONTRAST 07/01/2024 07:50:51 AM TECHNIQUE: CT of the abdomen and pelvis was performed with the administration of intravenous contrast. Multiplanar reformatted images are provided for review. Automated exposure control, iterative reconstruction, and/or weight-based adjustment of the mA/kV was utilized to reduce the radiation dose to as low as reasonably achievable. COMPARISON: CT 12/26/2005. CLINICAL HISTORY: 83 year old male with lung cancer. RLQ abdominal pain. FINDINGS: LOWER CHEST: CTA chest today is reported separately. LIVER: Numerous small and chronic,  circumscribed hypodense areas throughout the liver appear to be stable since 2017, numerous benign hepatic cysts (no follow up imaging recommended). GALLBLADDER AND BILE DUCTS: Diminutive or absent gallbladder. No biliary ductal dilatation. SPLEEN: No acute abnormality. PANCREAS: No acute abnormality. ADRENAL GLANDS: Chronic left adrenal gland nodular enlargement up to 2.3 cm is stable since 2017 consistent with benign adrenal adenoma. No adrenal metastasis identified. KIDNEYS, URETERS AND BLADDER: There is a round partially exophytic solid mass arising from the inferior pole of the left kidney measuring 2.4 cm (coronal image 76) which is new since 2017 and highly suspicious for renal cell carcinoma. Nonobstructed kidneys with numerous benign appearing renal cysts otherwise, symmetric renal contrast excretion. Diminutive bladder. No stones in the kidneys or ureters. No hydronephrosis. No perinephric or periureteral stranding. GI AND BOWEL: Stomach demonstrates no acute abnormality. Nondilated bowel loops with abundant retained stool in the colon. Normal gas containing appendix suspected on coronal image 50. There is no bowel obstruction. PERITONEUM AND RETROPERITONEUM: No ascites. No free air. VASCULATURE: Advanced atherosclerosis throughout the abdomen and pelvis. Infrarenal abdominal aortic aneurysm with concentric mural plaque or thrombus measures up to 4.6 cm maximum diameter (on series 2 image 38) and is progressed since 2017. The major arterial structures in the abdomen and pelvis remain patent. Portal venous system appears to be patent. LYMPH NODES: No lymphadenopathy. REPRODUCTIVE ORGANS: Heterogeneous prostatomegaly. BONES AND SOFT TISSUES: Superior L3 vertebral body lucent/lytic lesion with mild endplate compression compatible with mild pathologic fracture. Indeterminate L4 mild compression fracture, but could be chronic and benign. No focal soft tissue abnormality. IMPRESSION: 1. Infrarenal abdominal  aortic aneurysm measuring up to 4.6 cm is enlarged since 2017. Recommend CT or MRI in 12 months and establish or continue care with a vascular specialist. 2. New since 2017 2.4 cm solid exophytic mass at the inferior pole of the left kidney, highly suspicious for renal cell carcinoma. Renal metastases is less likely. 3. Stable chronic and benign left adrenal adenoma. No other abdominal visceral metastases identified. 4. Superior L3 vertebral body lytic lesion with mild endplate compression compatible with mild pathologic fracture. Indeterminate mild L4 compression fracture, which may be chronic and benign. 5. CTA Chest reported separately. Electronically signed by: Helayne Hurst MD 07/01/2024 08:14 AM EST RP Workstation: [id number]   CT Angio Chest PE W and/or Wo Contrast Result Date: 07/01/2024 EXAM: CTA CHEST 07/01/2024 07:50:51 AM TECHNIQUE: CTA of the chest was performed after the administration of intravenous contrast. Multiplanar reformatted images are provided for review. MIP images are provided for review. Automated exposure control, iterative reconstruction, and/or weight based adjustment  of the mA/kV was utilized to reduce the radiation dose to as low as reasonably achievable. 75 mL of iohexol  (OMNIPAQUE ) 350 MG/ML injection was administered. COMPARISON: Restaging chest CT 05/17/2024. CT abdomen and pelvis today reported separately. CLINICAL HISTORY: 83 year old male. History of lung cancer. Pain and swelling. Pulmonary embolism (PE) suspected, high prob. FINDINGS: PULMONARY ARTERIES: Pulmonary arteries are adequately opacified for evaluation. No acute pulmonary embolus. Main pulmonary artery is normal in caliber. MEDIASTINUM: Bulky and infiltrative appearing mediastinal ex nodal disease, tumor appears progressed at the anterior carinal station (series 4 image 59 now versus series 2 image 67 in september), bulky left hilar nodal metastatic disease appears more stable. Heart size stable and within normal  limits. No pericardial effusion. Aortic atherosclerosis. Little contrast in the distal thoracic aorta. The SVC remains patent. LYMPH NODES: Bulky and infiltrative appearing mediastinal ex nodal disease, tumor appears progressed at the anterior carinal station (series 4 image 59 now versus series 2 image 67 in september), bulky left hilar nodal metastatic disease appears more stable. No axillary lymphadenopathy. LUNGS AND PLEURA: Chronic lung disease with emphysema, scarring, architectural distortion. Major airways remain patent. Lung volumes have not significantly changed. Post biopsy clip in the left upper lobe, adjacent patchy left lung opacity is stable. Abnormal left lower lobe pulmonary nodule in the posterior basal segment is stable measuring 8 mm. No pleural effusion. No new pulmonary opacity. UPPER ABDOMEN: Limited images of the upper abdomen are unremarkable. CT abdomen and pelvis today is reported separately. SOFT TISSUES AND BONES: Chronic left 3rd through 6th rib fractures. T6 vertebral body posterior lytic lesion destroying the posterior bony cortex (sagittal image 72) is new since september. Mild ventral epidural extension of tumor there is not excluded (series 4 image 62). T9 vertebral body lucency and mild superior endplate compression are also new, suspicious for interval pathology compression fracture. IMPRESSION: 1. Progressed metastatic lung cancer since September restaging CT in the form of new or increased thoracic spinal metastases (possible early epidural extension at T6), and increased mediastinal infiltrative tumor. 2. No acute pulmonary embolism. 3. CT abdomen and pelvis reported separately. Electronically signed by: Helayne Hurst MD 07/01/2024 08:00 AM EST RP Workstation: [id number]    Assessment and Plan: 54M h/o CVA, PTSD, HTN, HLD, PVD, and metastatic lung cancer (dx 01/2024; s/p SBRT; new LUL lesion and adenopathy in 05/2024; last chemotherapy at [facility] on 10/27) p/w back pain  and BLE weakness and found to have lytic lesions of spine, new renal mass c/f RCC,and enlarged AAA.  L3 lytic lesion Fragility fracture H/o lung cancer -PT/OT consulted; apprec eval/recs -Oncology consulted; apprec eval/recs -Multimodal pain control with tylenol  1g TID, robaxin 500mg  TID and IV morphine 2mg  q4h prn -F/u 25-OH vitamin D  and treat if low w/ ergochalciferol 50,000 IU weekly for 8-12 weeks; pt will need 25-OH vitamin D  re-testing as outpt to ensure repletion in 8-12 weeks; may consider ergochalciferol 2000 IU daily after testing in 8-12 weeks for maintenance therapy -Consider bisphosphonate or Prolia at d/c and OP PCP f/u for DEXA scan  Renal mass Presumed RCC -Urology consulted and deferred to OP eval; will need OP Urology referral at discharge  AAA Infrarenal abdominal aortic aneurysm measuring up to 4.6 cm is enlarged since 2017. Recommend CT or MRI in 12 months and establish or continue care with a vascular specialist. -OP vascular surgery f/u at discharge  HTN -PTA losartan  100mg  daily and metoprolol  12.5mg  daily  BPH -PTA Flomax  0.4mg  daily   Advance Care Planning:  Code Status: Full Code   Consults: Oncology, Urology  Family Communication: Son  Severity of Illness: The appropriate patient status for this patient is INPATIENT. Inpatient status is judged to be reasonable and necessary in order to provide the required intensity of service to ensure the patient's safety. The patient's presenting symptoms, physical exam findings, and initial radiographic and laboratory data in the context of their chronic comorbidities is felt to place them at high risk for further clinical deterioration. Furthermore, it is not anticipated that the patient will be medically stable for discharge from the hospital within 2 midnights of admission.   * I certify that at the point of admission it is my clinical judgment that the patient will require inpatient hospital care spanning  beyond 2 midnights from the point of admission due to high intensity of service, high risk for further deterioration and high frequency of surveillance required.*   ------- I spent 55 minutes reviewing previous notes, at the bedside counseling/discussing the treatment plan, and performing clinical documentation.  Author: Marsha Ada, MD 07/01/2024 12:57 PM  For on call review [email or web address].

## 2024-07-01 NOTE — Plan of Care (Signed)
  Problem: Clinical Measurements: Goal: Cardiovascular complication will be avoided Outcome: Progressing   Problem: Activity: Goal: Risk for activity intolerance will decrease Outcome: Not Progressing   Problem: Coping: Goal: Level of anxiety will decrease Outcome: Progressing   Problem: Pain Managment: Goal: General experience of comfort will improve and/or be controlled Outcome: Not Progressing

## 2024-07-01 NOTE — ED Notes (Signed)
 Patient transported to CT; No changes

## 2024-07-01 NOTE — ED Provider Notes (Signed)
 [place] [facility] Provider Note   CSN: [id number] Arrival date & time: 07/01/24  0445     Patient presents with: Back Pain and Abdominal Pain   Roberto Wallace. is a 83 y.o. male with past medical history of recently diagnosed metastatic lung cancer, hypertension, inguinal hernia, and COPD presenting to the emergency department for low back pain, abdominal pain, and shortness of breath for about 10 days.  Patient reports he was lifting a wheelchair in and out of the car multiple times the end of October when he began experiencing right sided low back pain.  He reports this pain radiates into his right leg.  He denies any fevers, chills, incontinence of stool or incontinence of urine.  Patient also endorses increased shortness of breath for the past 3 days.  Denies any cough, congestion or increased sputum production.     Back Pain Associated symptoms: abdominal pain   Abdominal Pain      Prior to Admission medications   Medication Sig Start Date End Date Taking? Authorizing Provider  aspirin  EC 81 MG tablet Take 1 tablet (81 mg total) by mouth daily. Swallow whole. 05/07/22  Yes Norleen Lynwood ORN, MD  atorvastatin  (LIPITOR ) 80 MG tablet Take 1 tablet (80 mg total) by mouth daily. Patient not taking: Reported on 07/01/2024 06/26/23   Thukkani, Arun K, MD  ezetimibe  (ZETIA ) 10 MG tablet Take 1 tablet (10 mg total) by mouth daily. 09/09/23   Thukkani, Arun K, MD  fexofenadine  (ALLEGRA ) 180 MG tablet 180 mg. 09/10/21   [provider]  losartan  (COZAAR ) 100 MG tablet Take 1 tablet by mouth once daily 03/08/24   Dick, Ernest H Jr., NP  memantine  (NAMENDA ) 5 MG tablet Take 1 tablet (5 mg at night) for 2 weeks, then increase to 1 tablet (5 mg) twice a day 12/09/22   Wertman, Sara E, PA-C  metoprolol  succinate (TOPROL  XL) 25 MG 24 hr tablet Take 0.5 tablets (12.5 mg total) by mouth at bedtime. 11/11/22   Wyn Jackee VEAR Mickey., NP  Multiple  Vitamins-Minerals (VITA-MIN PO) Take 1 tablet by mouth daily at 6 (six) AM. Beef Root Supplement,Pt takes 1000 mg 1 tablet daily.    [provider]  Multiple Vitamins-Minerals (VITA-MIN PO) Take 1 tablet by mouth daily at 6 (six) AM. Garlic Supplement ,Pt takes 1000 mg 1 tablet daily.    [provider]  sertraline  (ZOLOFT ) 25 MG tablet Take 1 tablet (25 mg total) by mouth daily. 06/03/21   Norleen Lynwood ORN, MD  tamsulosin  (FLOMAX ) 0.4 MG CAPS capsule Take 0.4 mg by mouth at bedtime. 09/15/22   [provider]  Tiotropium Bromide 1.25 MCG/ACT AERS Take 2 puffs by mouth once. 11/18/23   [provider]  Tiotropium Bromide Monohydrate 1.25 MCG/ACT AERS Take by mouth. 11/18/23   [provider]  triamcinolone cream (KENALOG) 0.1 % Apply 1 Application topically 2 (two) times daily as needed (dry skin).    [provider]  vitamin B-12 (CYANOCOBALAMIN ) 100 MCG tablet Take 1 tablet (100 mcg total) by mouth daily. 12/13/20   Norleen Lynwood ORN, MD    Allergies: Amlodipine, Brinzolamide-brimonidine, and Pollen extract    Review of Systems  Gastrointestinal:  Positive for abdominal pain.  Musculoskeletal:  Positive for back pain.    Updated Vital Signs BP (!) 136/93 (BP Location: Left Arm)   Pulse 80   Temp 98.3 F (36.8 C) (Oral)   Resp 18  SpO2 100%   Physical Exam Vitals and nursing note reviewed.  Constitutional:      General: He is not in acute distress.    Appearance: He is well-developed.  HENT:     Head: Normocephalic and atraumatic.  Eyes:     Conjunctiva/sclera: Conjunctivae normal.  Cardiovascular:     Rate and Rhythm: Normal rate and regular rhythm.     Heart sounds: No murmur heard. Pulmonary:     Effort: No respiratory distress.     Breath sounds: Normal breath sounds.     Comments: Tachypnea and increased work of breathing Abdominal:     General: Abdomen is flat.     Palpations: Abdomen is soft.     Tenderness: There is no  abdominal tenderness.     Hernia: No hernia is present.  Musculoskeletal:        General: No swelling.     Cervical back: Neck supple.     Lumbar back: Tenderness and bony tenderness present.  Skin:    General: Skin is warm and dry.     Capillary Refill: Capillary refill takes less than 2 seconds.  Neurological:     Mental Status: He is alert.     (all labs ordered are listed, but only abnormal results are displayed) Labs Reviewed  COMPREHENSIVE METABOLIC PANEL WITH GFR - Abnormal; Notable for the following components:      Result Value   Glucose, Bld 114 (*)    All other components within normal limits  CBC - Abnormal; Notable for the following components:   Hemoglobin 12.9 (*)    All other components within normal limits  URINALYSIS, ROUTINE W REFLEX MICROSCOPIC - Abnormal; Notable for the following components:   Hgb urine dipstick SMALL (*)    All other components within normal limits  LIPASE, BLOOD  VITAMIN D  25 HYDROXY (VIT D DEFICIENCY, FRACTURES)  TROPONIN I (HIGH SENSITIVITY)    EKG: EKG Interpretation Date/Time:  Friday July 01 2024 05:32:07 EST Ventricular Rate:  85 PR Interval:  146 QRS Duration:  86 QT Interval:  356 QTC Calculation: 423 R Axis:   80  Text Interpretation: Normal sinus rhythm Minimal voltage criteria for LVH, may be normal variant ( Sokolow-Lyon ) Borderline ECG When compared with ECG of 29-Dec-2023 08:41, PREVIOUS ECG IS PRESENT Interpretation limited secondary to artifact Confirmed by Elnor Savant ([id number]) on 07/01/2024 8:56:21 AM  Radiology: DG Chest Portable 1 View Result Date: 07/01/2024 EXAM: 1 VIEW XRAY OF THE CHEST 07/01/2024 08:11:00 AM COMPARISON: Comparison 06/13/2024. CLINICAL HISTORY: SOB FINDINGS: LUNGS AND PLEURA: Stable bilateral lung scarring. No focal pulmonary opacity. No pulmonary edema. No pleural effusion. No pneumothorax. HEART AND MEDIASTINUM: No acute abnormality of the cardiac and mediastinal silhouettes. BONES AND  SOFT TISSUES: Old left rib fractures are noted. IMPRESSION: 1. Stable bilateral lung scarring. 2. Old left rib fractures. Electronically signed by: Lynwood Seip MD 07/01/2024 08:27 AM EST RP Workstation: [id number]   CT T-SPINE NO CHARGE Result Date: 07/01/2024 EXAM: CT THORACIC SPINE WITHOUT CONTRAST 07/01/2024 07:50:51 AM TECHNIQUE: CT of the thoracic spine was performed without the administration of intravenous contrast. Multiplanar reformatted images are provided for review. Automated exposure control, iterative reconstruction, and/or weight based adjustment of the mA/kV was utilized to reduce the radiation dose to as low as reasonably achievable. COMPARISON: CTA chest today reported separately. CLINICAL HISTORY: 83 year old male with lung cancer. FINDINGS: BONES AND ALIGNMENT: Normal thoracic segmentation. Maintained thoracic kyphosis. Minimal thoracic scoliosis. No spondylolisthesis. T6 lytic vertebral  body lesion is up to 15 mm diameter, with evidence of early ventral epidural tumor extension on series 6 image 35. Maintained T6 vertebral body height. Indistinct T9 superior vertebral body lucency (series 2 image 93) with associated mild superior endplate compression fracture suspicious for pathologic fracture. T9 < 20% loss of vertebral body height. No retropulsion. No other discrete thoracic vertebral lesion by CT. SOFT TISSUES: Abnormal mediastinum and other thoracic viscera are reported separately today. IMPRESSION: 1. T6 lytic vertebral body lesion (up to 15 mm) with early ventral epidural tumor extension; maintained T6 vertebral body height. 2. Indistinct T9 superior vertebral body lucency with mild superior endplate compression compatible with pathologic fracture. No retropulsion. 3. Abnormal Chest CTA today reported separately. Electronically signed by: Helayne Hurst MD 07/01/2024 08:21 AM EST RP Workstation: [id number]   CT L-SPINE NO CHARGE Result Date: 07/01/2024 EXAM: CT OF THE LUMBAR SPINE  WITHOUT CONTRAST 07/01/2024 07:50:51 AM TECHNIQUE: CT of the lumbar spine was performed without the administration of intravenous contrast. Multiplanar reformatted images are provided for review. Automated exposure control, iterative reconstruction, and/or weight based adjustment of the mA/kV was utilized to reduce the radiation dose to as low as reasonably achievable. COMPARISON: CT abdomen and pelvis 07/01/2024 07:50:51 AM, reported separately. CLINICAL HISTORY: 83 year old male with lung cancer. FINDINGS: BONES AND ALIGNMENT: Normal lumbar segmentation. L3 superior vertebral body roughly 1.5 cm lucent or lytic lesion with superior endplate compression is suspicious for metastasis with pathologic fracture. L3 12% loss of vertebral body height. No retropulsion. Indeterminate, possibly chronic and benign L4 superior endplate compression fracture. L4 21% loss of vertebral body height. No retropulsion. No other bone metastases. SOFT TISSUES: Abdominal and pelvic viscera are reported separately today. IMPRESSION: 1. L3 superior vertebral body lytic lesion with superior endplate compression, suspicious for metastasis and pathologic fracture (12% loss of height, No retropulsion) 2. Indeterminate L4 superior endplate compression fracture, possibly chronic and benign (21% loss of height, No retropulsion). Electronically signed by: Helayne Hurst MD 07/01/2024 08:17 AM EST RP Workstation: [id number]   CT ABDOMEN PELVIS W CONTRAST Result Date: 07/01/2024 EXAM: CT ABDOMEN AND PELVIS WITH CONTRAST 07/01/2024 07:50:51 AM TECHNIQUE: CT of the abdomen and pelvis was performed with the administration of intravenous contrast. Multiplanar reformatted images are provided for review. Automated exposure control, iterative reconstruction, and/or weight-based adjustment of the mA/kV was utilized to reduce the radiation dose to as low as reasonably achievable. COMPARISON: CT 12/26/2005. CLINICAL HISTORY: 83 year old male with lung cancer.  RLQ abdominal pain. FINDINGS: LOWER CHEST: CTA chest today is reported separately. LIVER: Numerous small and chronic, circumscribed hypodense areas throughout the liver appear to be stable since 2017, numerous benign hepatic cysts (no follow up imaging recommended). GALLBLADDER AND BILE DUCTS: Diminutive or absent gallbladder. No biliary ductal dilatation. SPLEEN: No acute abnormality. PANCREAS: No acute abnormality. ADRENAL GLANDS: Chronic left adrenal gland nodular enlargement up to 2.3 cm is stable since 2017 consistent with benign adrenal adenoma. No adrenal metastasis identified. KIDNEYS, URETERS AND BLADDER: There is a round partially exophytic solid mass arising from the inferior pole of the left kidney measuring 2.4 cm (coronal image 76) which is new since 2017 and highly suspicious for renal cell carcinoma. Nonobstructed kidneys with numerous benign appearing renal cysts otherwise, symmetric renal contrast excretion. Diminutive bladder. No stones in the kidneys or ureters. No hydronephrosis. No perinephric or periureteral stranding. GI AND BOWEL: Stomach demonstrates no acute abnormality. Nondilated bowel loops with abundant retained stool in the colon. Normal gas containing appendix suspected on  coronal image 50. There is no bowel obstruction. PERITONEUM AND RETROPERITONEUM: No ascites. No free air. VASCULATURE: Advanced atherosclerosis throughout the abdomen and pelvis. Infrarenal abdominal aortic aneurysm with concentric mural plaque or thrombus measures up to 4.6 cm maximum diameter (on series 2 image 38) and is progressed since 2017. The major arterial structures in the abdomen and pelvis remain patent. Portal venous system appears to be patent. LYMPH NODES: No lymphadenopathy. REPRODUCTIVE ORGANS: Heterogeneous prostatomegaly. BONES AND SOFT TISSUES: Superior L3 vertebral body lucent/lytic lesion with mild endplate compression compatible with mild pathologic fracture. Indeterminate L4 mild compression  fracture, but could be chronic and benign. No focal soft tissue abnormality. IMPRESSION: 1. Infrarenal abdominal aortic aneurysm measuring up to 4.6 cm is enlarged since 2017. Recommend CT or MRI in 12 months and establish or continue care with a vascular specialist. 2. New since 2017 2.4 cm solid exophytic mass at the inferior pole of the left kidney, highly suspicious for renal cell carcinoma. Renal metastases is less likely. 3. Stable chronic and benign left adrenal adenoma. No other abdominal visceral metastases identified. 4. Superior L3 vertebral body lytic lesion with mild endplate compression compatible with mild pathologic fracture. Indeterminate mild L4 compression fracture, which may be chronic and benign. 5. CTA Chest reported separately. Electronically signed by: Helayne Hurst MD 07/01/2024 08:14 AM EST RP Workstation: [id number]   CT Angio Chest PE W and/or Wo Contrast Result Date: 07/01/2024 EXAM: CTA CHEST 07/01/2024 07:50:51 AM TECHNIQUE: CTA of the chest was performed after the administration of intravenous contrast. Multiplanar reformatted images are provided for review. MIP images are provided for review. Automated exposure control, iterative reconstruction, and/or weight based adjustment of the mA/kV was utilized to reduce the radiation dose to as low as reasonably achievable. 75 mL of iohexol  (OMNIPAQUE ) 350 MG/ML injection was administered. COMPARISON: Restaging chest CT 05/17/2024. CT abdomen and pelvis today reported separately. CLINICAL HISTORY: 83 year old male. History of lung cancer. Pain and swelling. Pulmonary embolism (PE) suspected, high prob. FINDINGS: PULMONARY ARTERIES: Pulmonary arteries are adequately opacified for evaluation. No acute pulmonary embolus. Main pulmonary artery is normal in caliber. MEDIASTINUM: Bulky and infiltrative appearing mediastinal ex nodal disease, tumor appears progressed at the anterior carinal station (series 4 image 59 now versus series 2 image 67 in  september), bulky left hilar nodal metastatic disease appears more stable. Heart size stable and within normal limits. No pericardial effusion. Aortic atherosclerosis. Little contrast in the distal thoracic aorta. The SVC remains patent. LYMPH NODES: Bulky and infiltrative appearing mediastinal ex nodal disease, tumor appears progressed at the anterior carinal station (series 4 image 59 now versus series 2 image 67 in september), bulky left hilar nodal metastatic disease appears more stable. No axillary lymphadenopathy. LUNGS AND PLEURA: Chronic lung disease with emphysema, scarring, architectural distortion. Major airways remain patent. Lung volumes have not significantly changed. Post biopsy clip in the left upper lobe, adjacent patchy left lung opacity is stable. Abnormal left lower lobe pulmonary nodule in the posterior basal segment is stable measuring 8 mm. No pleural effusion. No new pulmonary opacity. UPPER ABDOMEN: Limited images of the upper abdomen are unremarkable. CT abdomen and pelvis today is reported separately. SOFT TISSUES AND BONES: Chronic left 3rd through 6th rib fractures. T6 vertebral body posterior lytic lesion destroying the posterior bony cortex (sagittal image 72) is new since september. Mild ventral epidural extension of tumor there is not excluded (series 4 image 62). T9 vertebral body lucency and mild superior endplate compression are also new, suspicious for interval  pathology compression fracture. IMPRESSION: 1. Progressed metastatic lung cancer since September restaging CT in the form of new or increased thoracic spinal metastases (possible early epidural extension at T6), and increased mediastinal infiltrative tumor. 2. No acute pulmonary embolism. 3. CT abdomen and pelvis reported separately. Electronically signed by: Helayne Hurst MD 07/01/2024 08:00 AM EST RP Workstation: [id number]     Procedures   Medications Ordered in the ED  morphine (PF) 2 MG/ML injection 2 mg (2 mg  Intravenous Given 07/01/24 1405)  acetaminophen  (TYLENOL ) tablet 1,000 mg (has no administration in time range)  methocarbamol (ROBAXIN) tablet 500 mg (has no administration in time range)  aspirin  EC tablet 81 mg (81 mg Oral Given 07/01/24 1403)  ezetimibe  (ZETIA ) tablet 10 mg (has no administration in time range)  losartan  (COZAAR ) tablet 100 mg (has no administration in time range)  memantine  (NAMENDA ) tablet 5 mg (has no administration in time range)  metoprolol  succinate (TOPROL -XL) 24 hr tablet 12.5 mg (has no administration in time range)  sertraline  (ZOLOFT ) tablet 25 mg (has no administration in time range)  tamsulosin  (FLOMAX ) capsule 0.4 mg (has no administration in time range)  ipratropium-albuterol  (DUONEB) 0.5-2.5 (3) MG/3ML nebulizer solution 3 mL (has no administration in time range)  morphine (PF) 4 MG/ML injection 4 mg (4 mg Intravenous Given 07/01/24 0732)  iohexol  (OMNIPAQUE ) 350 MG/ML injection 75 mL (75 mLs Intravenous Contrast Given 07/01/24 0751)  morphine (PF) 4 MG/ML injection 4 mg (4 mg Intravenous Given 07/01/24 0940)                                    Medical Decision Making Patient is 83 year old male with recent diagnosis of metastatic lung disease presenting with right sided low back pain.  On evaluation, patient has point tenderness over the L-spine as well as radiation down his right leg.  Patient also endorses shortness of breath in the past 3 days, he has [facility]  Differential diagnosis includes but not limited to metastatic lesion, PE, pneumonia, lumbar radiculopathy, ACS  Patient is high risk for pulmonary embolism in the setting of his current cancer treatment, given his sudden onset shortness of breath CTA chest was ordered to further evaluate for PE  Because of patient's metastatic disease, CT abdomen pelvis were ordered with T and L-spine reformats.  Patient given IV morphine for pain control  EKG with normal  sinus rhythm and no evidence of acute ischemia  Labs reassuring without leukocytosis, anemia, or significant electrolyte abnormality.  No troponin elevation.  The pain severity was reported to be a 6/10 at the time of presentation. The patient was unable to get up and perform daily activities effectively due to the pain; as such, he presented to the ED for evaluation.  CTA chest without evidence of acute pulmonary embolism.  CTA abdomen pelvis with T and L reformats significant for an enlarged solid mass in the left kidney suspicious for renal cell carcinoma, and multiple lytic lesions in the T and L-spine specifically evidence of L3 pathologic compression fracture.  On reevaluation, patient continues to endorse significant pain on palpation.  He was unable to sit from a laying position or bear weight without significant pain.  Because of this, patient is unable to transfer in and out of his wheelchair or perform his ADLs at home.  Patient requires admission for further evaluation and pain control.  Hospitalist was consulted  who agreed to admit this patient.  Patient was admitted in stable condition.  Please see admitting team's note for the remainder of the patient's care.   Amount and/or Complexity of Data Reviewed External Data Reviewed: labs, ECG and notes. Labs: ordered. Radiology: ordered and independent interpretation performed. ECG/medicine tests: ordered and independent interpretation performed.  Risk Prescription drug management. Decision regarding hospitalization.        Final diagnoses:  Malignant neoplasm metastatic to bone [facility])    ED Discharge Orders     None          Sharlet Dowdy, MD 07/01/24 1600    Elnor Savant A, DO 07/08/24 1817

## 2024-07-01 NOTE — Progress Notes (Signed)
 Pt arrived from [facility] via stretcher. Pt stated he is not able to walk but he also stated he uses a walker and a motorize wheelchair. Call button and room phone in reach.

## 2024-07-01 NOTE — ED Triage Notes (Addendum)
 Patient from home BIB [facility] with complaints of back pain since the year 1970. Hx of herniated disc. The pain started to increase 2 weeks ago. Cancer patient (lung cancer) receiving chemotherapy at the [facility]. VSS.

## 2024-07-02 DIAGNOSIS — M898X9 Other specified disorders of bone, unspecified site: Secondary | ICD-10-CM | POA: Diagnosis not present

## 2024-07-02 MED ORDER — ENSURE PLUS HIGH PROTEIN PO LIQD: 237.0000 mL | Freq: Two times a day (BID) | ORAL | Status: DC

## 2024-07-02 MED ORDER — OXYCODONE HCL ER 10 MG PO T12A
10.0000 mg | EXTENDED_RELEASE_TABLET | Freq: Two times a day (BID) | ORAL | Status: DC
  Administered 2024-07-02 – 2024-07-04 (×4): 10 mg via ORAL
  Filled 2024-07-02 (×4): qty 1

## 2024-07-02 MED ORDER — ENSURE PLUS HIGH PROTEIN PO LIQD
237.0000 mL | Freq: Two times a day (BID) | ORAL | Status: DC
  Administered 2024-07-03 – 2024-07-04 (×3): 237 mL via ORAL

## 2024-07-02 MED ORDER — LIDOCAINE 5 % EX PTCH
1.0000 | MEDICATED_PATCH | CUTANEOUS | Status: DC
  Administered 2024-07-02 – 2024-07-03 (×2): 1 via TRANSDERMAL
  Filled 2024-07-02 (×2): qty 1

## 2024-07-02 MED ORDER — ONDANSETRON HCL 4 MG/2ML IJ SOLN: 4.0000 mg | Freq: Three times a day (TID) | INTRAMUSCULAR | Status: DC | PRN

## 2024-07-02 MED ORDER — OXYCODONE HCL 5 MG PO TABS
5.0000 mg | ORAL_TABLET | Freq: Four times a day (QID) | ORAL | Status: DC | PRN
  Administered 2024-07-03: 5 mg via ORAL
  Filled 2024-07-02: qty 1

## 2024-07-02 NOTE — Evaluation (Signed)
 Occupational Therapy Evaluation Patient Details Name: Roberto MAMARIL [name] MRN: [id number] DOB: 1940-11-09 Today's Date: 07/02/2024   History of Present Illness   Roberto Salminen. is a 83 y.o. male who presented to [facility] ED 07/01/24 for worsening back pain and BLE weakness. CT abd/pelvis showed infrarenal abdominal aortic aneurysm measuring up to 4.6 cm is enlarged since 2017, 2.4 cm solid exophytic mass at the inferior pole of the left kidney, highly suspicious for renal cell carcinoma (new since 2017), and superior L3 vertebral body lytic lesion with mild endplate compression compatible with mild pathologic fracture, as well as indeterminate mild L4 compression fracture, which may be chronic and benign. PMHx: arthritis, cancer, CAD, HTN, PTSD, PVD, stroke, TB     Clinical Impressions Roberto Wallace was evaluated s/p the above admission list. He lives alone and is mod I with DME use at baseline. Upon evaluation the pt was limited by impaired cognition, generalized weakness, impulsivity, unsteady balance and decreased activity tolerance. Overall he needed up to [facility] for transfers and mobility with the RW for safety only. Due to the deficits listed below the pt also needs CGA for all OOB/standing ADLs. Pt had complaints of back pain throughout. Back precautions utilized throughout for comfort - no official order. Pt will benefit from continued acute OT services and skilled inpatient follow up therapy, <3 hours/day.  Pt is requesting post-acute rehab due to lack of support at home.      If plan is discharge home, recommend the following:   Assist for transportation;Assistance with cooking/housework;Supervision due to cognitive status     Functional Status Assessment   Patient has had a recent decline in their functional status and demonstrates the ability to make significant improvements in function in a reasonable and predictable amount of time.     Equipment Recommendations   None  recommended by OT      Precautions/Restrictions   Precautions Precautions: Fall;Back (spinal precautions for comfort - no order) Precaution Booklet Issued: No Recall of Precautions/Restrictions: Impaired Restrictions Weight Bearing Restrictions Per Provider Order: No     Mobility Bed Mobility Overal bed mobility: Modified Independent             General bed mobility comments: no assist needed - pt used log roll without cues    Transfers Overall transfer level: Needs assistance Equipment used: Rolling walker (2 wheels) Transfers: Sit to/from Stand Sit to Stand: Contact guard assist           General transfer comment: for safety only      Balance Overall balance assessment: Needs assistance Sitting-balance support: Feet supported Sitting balance-Leahy Scale: Good     Standing balance support: Bilateral upper extremity supported, During functional activity Standing balance-Leahy Scale: Poor Standing balance comment: did not assess balance without UE support on eval               ADL either performed or assessed with clinical judgement   ADL Overall ADL's : Needs assistance/impaired Eating/Feeding: Independent   Grooming: Contact guard assist;Standing   Upper Body Bathing: Set up;Sitting   Lower Body Bathing: Contact guard assist;Sit to/from stand   Upper Body Dressing : Set up;Standing   Lower Body Dressing: Contact guard assist;Sit to/from stand   Toilet Transfer: Contact guard assist;Ambulation;Rolling walker (2 wheels)   Toileting- Clothing Manipulation and Hygiene: Supervision/safety;Sitting/lateral lean       Functional mobility during ADLs: Contact guard assist;Rolling walker (2 wheels) General ADL Comments: impulsive, CGA for safety  Vision Baseline Vision/History: 0 No visual deficits Vision Assessment?: No apparent visual deficits     Perception Perception: Not tested       Praxis Praxis: Not tested       Pertinent  Vitals/Pain Pain Assessment Pain Assessment: Faces Faces Pain Scale: Hurts little more Pain Location: back Pain Descriptors / Indicators: Discomfort, Grimacing, Guarding Pain Intervention(s): Limited activity within patient's tolerance, Monitored during session     Extremity/Trunk Assessment Upper Extremity Assessment Upper Extremity Assessment: Generalized weakness   Lower Extremity Assessment Lower Extremity Assessment: Defer to PT evaluation   Cervical / Trunk Assessment Cervical / Trunk Assessment: Kyphotic   Communication Communication Communication: No apparent difficulties   Cognition Arousal: Alert Behavior During Therapy: Agitated, Impulsive Cognition: History of cognitive impairments             OT - Cognition Comments: self reporting dementia dx. Pt with very limited insight, mild agitation noted, implusive movement and often ignores safety cues                 Following commands: Impaired Following commands impaired: Only follows one step commands consistently, Follows multi-step commands inconsistently     Cueing  General Comments   Cueing Techniques: Verbal cues  VSS on RA           Home Living Family/patient expects to be discharged to:: Private residence (Simultaneous filing. User may not have seen previous data.) Living Arrangements: Alone (Simultaneous filing. User may not have seen previous data.) Available Help at Discharge: Friend(s) (Simultaneous filing. User may not have seen previous data.) Type of Home: House (Simultaneous filing. User may not have seen previous data.) Home Access: Level entry (Simultaneous filing. User may not have seen previous data.)     Home Layout: One level (Simultaneous filing. User may not have seen previous data.)     Bathroom Shower/Tub: Tub/shower unit (Simultaneous filing. User may not have seen previous data.)   Bathroom Toilet: Standard (Simultaneous filing. User may not have seen previous  data.) Bathroom Accessibility: Yes   Home Equipment: Rolling Walker (2 wheels);Cane - single point;Shower seat;BSC/3in1;Electric scooter;Grab bars - toilet;Grab bars - tub/shower (Simultaneous filing. User may not have seen previous data.)          Prior Functioning/Environment Prior Level of Function : Independent/Modified Independent (Simultaneous filing. User may not have seen previous data.)             Mobility Comments: RW or SPC in the home, scooter outside the home. denies falls (Simultaneous filing. User may not have seen previous data.) ADLs Comments: mod I (Simultaneous filing. User may not have seen previous data.)    OT Problem List: Decreased strength;Decreased activity tolerance;Decreased range of motion;Impaired balance (sitting and/or standing);Decreased safety awareness;Decreased knowledge of use of DME or AE;Decreased knowledge of precautions   OT Treatment/Interventions: Self-care/ADL training;Therapeutic exercise;DME and/or AE instruction;Therapeutic activities;Patient/family education;Balance training      OT Goals(Current goals can be found in the care plan section)   Acute Rehab OT Goals Patient Stated Goal: less pain OT Goal Formulation: With patient Time For Goal Achievement: 07/16/24 Potential to Achieve Goals: Good ADL Goals Pt Will Perform Grooming: with modified independence Pt Will Perform Upper Body Dressing: with modified independence Pt Will Perform Lower Body Dressing: with modified independence Pt Will Transfer to Toilet: with modified independence   OT Frequency:  Min 2X/week       AM-PAC OT 6 Clicks Daily Activity     Outcome Measure Help from another person  eating meals?: None Help from another person taking care of personal grooming?: A Little Help from another person toileting, which includes using toliet, bedpan, or urinal?: A Little Help from another person bathing (including washing, rinsing, drying)?: A Little Help from  another person to put on and taking off regular upper body clothing?: A Little Help from another person to put on and taking off regular lower body clothing?: A Little 6 Click Score: 19   End of Session Equipment Utilized During Treatment: Rolling walker (2 wheels) Nurse Communication: Mobility status  Activity Tolerance: Patient tolerated treatment well Patient left: in chair;with call bell/phone within reach;with chair alarm set  OT Visit Diagnosis: Unsteadiness on feet (R26.81);Other abnormalities of gait and mobility (R26.89);Muscle weakness (generalized) (M62.81)                Time: 8694-8677 OT Time Calculation (min): 17 min Charges:  OT General Charges $OT Visit: 1 Visit OT Evaluation $OT Eval Moderate Complexity: 1 Mod  Lucie Kendall, OTR/L Acute Rehabilitation Services Office [phone] Secure Chat Communication Preferred   Lucie JONETTA Kendall 07/02/2024, 2:01 PM

## 2024-07-02 NOTE — Plan of Care (Signed)

## 2024-07-02 NOTE — Progress Notes (Signed)
 PROGRESS NOTE    Roberto Wallace  FMW:[id number] DOB: 1941/06/25 DOA: 07/01/2024 PCP: Norleen Lynwood ORN, MD  Outpatient Specialists:     Brief Narrative:  Patient is an 83 year old male with past medical history significant for coronary artery disease, hyperlipidemia, hypertension, migraine, peripheral vascular disease, stroke, vitamin D  deficiency and metastatic lung cancer (diagnosed in June 2025) status post SBRT.  Patient was admitted with back pain.  Assessment & Plan:   Principal Problem:   Lytic lesion of bone on x-ray   L3 lytic lesion Fragility fracture H/o lung cancer -PT/OT consulted -Oncology consulted; awaiting input. -Multimodal pain control with tylenol  1g TID, robaxin 500mg  TID and IV morphine 2mg  q4h prn -Patient continues to report 8 out of 10 pain. - Start OxyContin. - Start oxycodone as needed. -F/u 25-OH vitamin D  and treat if low w/ ergochalciferol 50,000 IU weekly for 8-12 weeks; pt will need 25-OH vitamin D  re-testing as outpt to ensure repletion in 8-12 weeks; may consider ergochalciferol 2000 IU daily after testing in 8-12 weeks for maintenance therapy -Consider bisphosphonate or Prolia at d/c and OP PCP f/u for DEXA scan   Renal mass Presumed RCC -Urology consulted and deferred to OP eval; will need OP Urology referral at discharge   AAA Infrarenal abdominal aortic aneurysm measuring up to 4.6 cm is enlarged since 2017. Recommend CT or MRI in 12 months and establish or continue care with a vascular specialist. -OP vascular surgery f/u at discharge   HTN -PTA losartan  100mg  daily and metoprolol  12.5mg  daily   BPH -PTA Flomax  0.4mg  daily   DVT prophylaxis:  Code Status: Full code Family Communication:  Disposition Plan: Inpatient   Consultants:  None  Procedures:  CT abdomen and pelvis with contrast revealed:  1. Infrarenal abdominal aortic aneurysm measuring up to 4.6 cm is enlarged since 2017. Recommend CT or MRI in 12 months and  establish or continue care with a vascular specialist. 2. New since 2017 2.4 cm solid exophytic mass at the inferior pole of the left kidney, highly suspicious for renal cell carcinoma. Renal metastases is less likely. 3. Stable chronic and benign left adrenal adenoma. No other abdominal visceral metastases identified. 4. Superior L3 vertebral body lytic lesion with mild endplate compression compatible with mild pathologic fracture. Indeterminate mild L4 compression fracture, which may be chronic and benign.  CT angio chest with and without contrast revealed: 1. Progressed metastatic lung cancer since September restaging CT in the form of new or increased thoracic spinal metastases (possible early epidural extension at T6), and increased mediastinal infiltrative tumor. 2. No acute pulmonary embolism. 3. CT abdomen and pelvis reported separately.   Antimicrobials:  None.   Subjective: Left lower back pain  Objective: Vitals:   07/01/24 2216 07/02/24 0016 07/02/24 0345 07/02/24 0948  BP: 121/82 122/85 119/82 126/89  Pulse: 86 84 80 78  Resp: 19 17 18 16   Temp: 98.2 F (36.8 C) 98.3 F (36.8 C) 98.1 F (36.7 C) 98.1 F (36.7 C)  TempSrc: Oral Oral Oral Oral  SpO2: 100% 99% 100% 100%    Intake/Output Summary (Last 24 hours) at 07/02/2024 1528 Last data filed at 07/01/2024 1828 Gross per 24 hour  Intake 240 ml  Output 250 ml  Net -10 ml   There were no vitals filed for this visit.  Examination:  General exam: Appears calm and comfortable  Respiratory system: Clear to auscultation. Respiratory effort normal. Cardiovascular system: S1 & S2 heard Gastrointestinal system: Abdomen is soft and nontender.  Central nervous system: Alert and oriented. . Extremities: No leg edema  Data Reviewed: I have personally reviewed following labs and imaging studies  CBC: Recent Labs  Lab 07/01/24 0522  WBC 5.5  HGB 12.9*  HCT 40.9  MCV 87.8  PLT 208   Basic Metabolic  Panel: Recent Labs  Lab 07/01/24 0522  NA 135  K 4.1  CL 98  CO2 23  GLUCOSE 114*  BUN 8  CREATININE 1.19  CALCIUM  9.1   GFR: Estimated Creatinine Clearance: 35.6 mL/min (by C-G formula based on SCr of 1.19 mg/dL). Liver Function Tests: Recent Labs  Lab 07/01/24 0522  AST 21  ALT 10  ALKPHOS 76  BILITOT 0.7  PROT 8.0  ALBUMIN 3.9   Recent Labs  Lab 07/01/24 0522  LIPASE 22   No results for input(s): AMMONIA in the last 168 hours. Coagulation Profile: No results for input(s): INR, PROTIME in the last 168 hours. Cardiac Enzymes: No results for input(s): CKTOTAL, CKMB, CKMBINDEX, TROPONINI in the last 168 hours. BNP (last 3 results) No results for input(s): PROBNP in the last 8760 hours. HbA1C: No results for input(s): HGBA1C in the last 72 hours. CBG: No results for input(s): GLUCAP in the last 168 hours. Lipid Profile: No results for input(s): CHOL, HDL, LDLCALC, TRIG, CHOLHDL, LDLDIRECT in the last 72 hours. Thyroid  Function Tests: No results for input(s): TSH, T4TOTAL, FREET4, T3FREE, THYROIDAB in the last 72 hours. Anemia Panel: No results for input(s): VITAMINB12, FOLATE, FERRITIN, TIBC, IRON, RETICCTPCT in the last 72 hours. Urine analysis:    Component Value Date/Time   COLORURINE YELLOW 07/01/2024 0735   APPEARANCEUR CLEAR 07/01/2024 0735   LABSPEC 1.008 07/01/2024 0735   PHURINE 6.0 07/01/2024 0735   GLUCOSEU NEGATIVE 07/01/2024 0735   GLUCOSEU NEGATIVE 03/07/2024 0925   HGBUR SMALL (A) 07/01/2024 0735   BILIRUBINUR NEGATIVE 07/01/2024 0735   KETONESUR NEGATIVE 07/01/2024 0735   PROTEINUR NEGATIVE 07/01/2024 0735   UROBILINOGEN 0.2 03/07/2024 0925   NITRITE NEGATIVE 07/01/2024 0735   LEUKOCYTESUR NEGATIVE 07/01/2024 0735   Sepsis Labs: @LABRCNTIP (procalcitonin:4,lacticidven:4)  )No results found for this or any previous visit (from the past 240 hours).       Radiology Studies: DG  Chest Portable 1 View Result Date: 07/01/2024 EXAM: 1 VIEW XRAY OF THE CHEST 07/01/2024 08:11:00 AM COMPARISON: Comparison 06/13/2024. CLINICAL HISTORY: SOB FINDINGS: LUNGS AND PLEURA: Stable bilateral lung scarring. No focal pulmonary opacity. No pulmonary edema. No pleural effusion. No pneumothorax. HEART AND MEDIASTINUM: No acute abnormality of the cardiac and mediastinal silhouettes. BONES AND SOFT TISSUES: Old left rib fractures are noted. IMPRESSION: 1. Stable bilateral lung scarring. 2. Old left rib fractures. Electronically signed by: Lynwood Seip MD 07/01/2024 08:27 AM EST RP Workstation: [id number]   CT T-SPINE NO CHARGE Result Date: 07/01/2024 EXAM: CT THORACIC SPINE WITHOUT CONTRAST 07/01/2024 07:50:51 AM TECHNIQUE: CT of the thoracic spine was performed without the administration of intravenous contrast. Multiplanar reformatted images are provided for review. Automated exposure control, iterative reconstruction, and/or weight based adjustment of the mA/kV was utilized to reduce the radiation dose to as low as reasonably achievable. COMPARISON: CTA chest today reported separately. CLINICAL HISTORY: 83 year old male with lung cancer. FINDINGS: BONES AND ALIGNMENT: Normal thoracic segmentation. Maintained thoracic kyphosis. Minimal thoracic scoliosis. No spondylolisthesis. T6 lytic vertebral body lesion is up to 15 mm diameter, with evidence of early ventral epidural tumor extension on series 6 image 35. Maintained T6 vertebral body height. Indistinct T9 superior vertebral body lucency (series  2 image 93) with associated mild superior endplate compression fracture suspicious for pathologic fracture. T9 < 20% loss of vertebral body height. No retropulsion. No other discrete thoracic vertebral lesion by CT. SOFT TISSUES: Abnormal mediastinum and other thoracic viscera are reported separately today. IMPRESSION: 1. T6 lytic vertebral body lesion (up to 15 mm) with early ventral epidural tumor extension;  maintained T6 vertebral body height. 2. Indistinct T9 superior vertebral body lucency with mild superior endplate compression compatible with pathologic fracture. No retropulsion. 3. Abnormal Chest CTA today reported separately. Electronically signed by: Helayne Hurst MD 07/01/2024 08:21 AM EST RP Workstation: [id number]   CT L-SPINE NO CHARGE Result Date: 07/01/2024 EXAM: CT OF THE LUMBAR SPINE WITHOUT CONTRAST 07/01/2024 07:50:51 AM TECHNIQUE: CT of the lumbar spine was performed without the administration of intravenous contrast. Multiplanar reformatted images are provided for review. Automated exposure control, iterative reconstruction, and/or weight based adjustment of the mA/kV was utilized to reduce the radiation dose to as low as reasonably achievable. COMPARISON: CT abdomen and pelvis 07/01/2024 07:50:51 AM, reported separately. CLINICAL HISTORY: 83 year old male with lung cancer. FINDINGS: BONES AND ALIGNMENT: Normal lumbar segmentation. L3 superior vertebral body roughly 1.5 cm lucent or lytic lesion with superior endplate compression is suspicious for metastasis with pathologic fracture. L3 12% loss of vertebral body height. No retropulsion. Indeterminate, possibly chronic and benign L4 superior endplate compression fracture. L4 21% loss of vertebral body height. No retropulsion. No other bone metastases. SOFT TISSUES: Abdominal and pelvic viscera are reported separately today. IMPRESSION: 1. L3 superior vertebral body lytic lesion with superior endplate compression, suspicious for metastasis and pathologic fracture (12% loss of height, No retropulsion) 2. Indeterminate L4 superior endplate compression fracture, possibly chronic and benign (21% loss of height, No retropulsion). Electronically signed by: Helayne Hurst MD 07/01/2024 08:17 AM EST RP Workstation: [id number]   CT ABDOMEN PELVIS W CONTRAST Result Date: 07/01/2024 EXAM: CT ABDOMEN AND PELVIS WITH CONTRAST 07/01/2024 07:50:51 AM TECHNIQUE: CT  of the abdomen and pelvis was performed with the administration of intravenous contrast. Multiplanar reformatted images are provided for review. Automated exposure control, iterative reconstruction, and/or weight-based adjustment of the mA/kV was utilized to reduce the radiation dose to as low as reasonably achievable. COMPARISON: CT 12/26/2005. CLINICAL HISTORY: 83 year old male with lung cancer. RLQ abdominal pain. FINDINGS: LOWER CHEST: CTA chest today is reported separately. LIVER: Numerous small and chronic, circumscribed hypodense areas throughout the liver appear to be stable since 2017, numerous benign hepatic cysts (no follow up imaging recommended). GALLBLADDER AND BILE DUCTS: Diminutive or absent gallbladder. No biliary ductal dilatation. SPLEEN: No acute abnormality. PANCREAS: No acute abnormality. ADRENAL GLANDS: Chronic left adrenal gland nodular enlargement up to 2.3 cm is stable since 2017 consistent with benign adrenal adenoma. No adrenal metastasis identified. KIDNEYS, URETERS AND BLADDER: There is a round partially exophytic solid mass arising from the inferior pole of the left kidney measuring 2.4 cm (coronal image 76) which is new since 2017 and highly suspicious for renal cell carcinoma. Nonobstructed kidneys with numerous benign appearing renal cysts otherwise, symmetric renal contrast excretion. Diminutive bladder. No stones in the kidneys or ureters. No hydronephrosis. No perinephric or periureteral stranding. GI AND BOWEL: Stomach demonstrates no acute abnormality. Nondilated bowel loops with abundant retained stool in the colon. Normal gas containing appendix suspected on coronal image 50. There is no bowel obstruction. PERITONEUM AND RETROPERITONEUM: No ascites. No free air. VASCULATURE: Advanced atherosclerosis throughout the abdomen and pelvis. Infrarenal abdominal aortic aneurysm with concentric mural plaque or  thrombus measures up to 4.6 cm maximum diameter (on series 2 image 38) and  is progressed since 2017. The major arterial structures in the abdomen and pelvis remain patent. Portal venous system appears to be patent. LYMPH NODES: No lymphadenopathy. REPRODUCTIVE ORGANS: Heterogeneous prostatomegaly. BONES AND SOFT TISSUES: Superior L3 vertebral body lucent/lytic lesion with mild endplate compression compatible with mild pathologic fracture. Indeterminate L4 mild compression fracture, but could be chronic and benign. No focal soft tissue abnormality. IMPRESSION: 1. Infrarenal abdominal aortic aneurysm measuring up to 4.6 cm is enlarged since 2017. Recommend CT or MRI in 12 months and establish or continue care with a vascular specialist. 2. New since 2017 2.4 cm solid exophytic mass at the inferior pole of the left kidney, highly suspicious for renal cell carcinoma. Renal metastases is less likely. 3. Stable chronic and benign left adrenal adenoma. No other abdominal visceral metastases identified. 4. Superior L3 vertebral body lytic lesion with mild endplate compression compatible with mild pathologic fracture. Indeterminate mild L4 compression fracture, which may be chronic and benign. 5. CTA Chest reported separately. Electronically signed by: Helayne Hurst MD 07/01/2024 08:14 AM EST RP Workstation: [id number]   CT Angio Chest PE W and/or Wo Contrast Result Date: 07/01/2024 EXAM: CTA CHEST 07/01/2024 07:50:51 AM TECHNIQUE: CTA of the chest was performed after the administration of intravenous contrast. Multiplanar reformatted images are provided for review. MIP images are provided for review. Automated exposure control, iterative reconstruction, and/or weight based adjustment of the mA/kV was utilized to reduce the radiation dose to as low as reasonably achievable. 75 mL of iohexol  (OMNIPAQUE ) 350 MG/ML injection was administered. COMPARISON: Restaging chest CT 05/17/2024. CT abdomen and pelvis today reported separately. CLINICAL HISTORY: 83 year old male. History of lung cancer. Pain and  swelling. Pulmonary embolism (PE) suspected, high prob. FINDINGS: PULMONARY ARTERIES: Pulmonary arteries are adequately opacified for evaluation. No acute pulmonary embolus. Main pulmonary artery is normal in caliber. MEDIASTINUM: Bulky and infiltrative appearing mediastinal ex nodal disease, tumor appears progressed at the anterior carinal station (series 4 image 59 now versus series 2 image 67 in september), bulky left hilar nodal metastatic disease appears more stable. Heart size stable and within normal limits. No pericardial effusion. Aortic atherosclerosis. Little contrast in the distal thoracic aorta. The SVC remains patent. LYMPH NODES: Bulky and infiltrative appearing mediastinal ex nodal disease, tumor appears progressed at the anterior carinal station (series 4 image 59 now versus series 2 image 67 in september), bulky left hilar nodal metastatic disease appears more stable. No axillary lymphadenopathy. LUNGS AND PLEURA: Chronic lung disease with emphysema, scarring, architectural distortion. Major airways remain patent. Lung volumes have not significantly changed. Post biopsy clip in the left upper lobe, adjacent patchy left lung opacity is stable. Abnormal left lower lobe pulmonary nodule in the posterior basal segment is stable measuring 8 mm. No pleural effusion. No new pulmonary opacity. UPPER ABDOMEN: Limited images of the upper abdomen are unremarkable. CT abdomen and pelvis today is reported separately. SOFT TISSUES AND BONES: Chronic left 3rd through 6th rib fractures. T6 vertebral body posterior lytic lesion destroying the posterior bony cortex (sagittal image 72) is new since september. Mild ventral epidural extension of tumor there is not excluded (series 4 image 62). T9 vertebral body lucency and mild superior endplate compression are also new, suspicious for interval pathology compression fracture. IMPRESSION: 1. Progressed metastatic lung cancer since September restaging CT in the form of  new or increased thoracic spinal metastases (possible early epidural extension at T6), and increased mediastinal  infiltrative tumor. 2. No acute pulmonary embolism. 3. CT abdomen and pelvis reported separately. Electronically signed by: Helayne Hurst MD 07/01/2024 08:00 AM EST RP Workstation: [id number]        Scheduled Meds:  acetaminophen   1,000 mg Oral TID   aspirin  EC  81 mg Oral Daily   ezetimibe   10 mg Oral Daily   losartan   100 mg Oral Daily   memantine   5 mg Oral BID   methocarbamol  500 mg Oral TID   metoprolol  succinate  12.5 mg Oral QHS   sertraline   25 mg Oral Daily   tamsulosin   0.4 mg Oral Daily   Continuous Infusions:   LOS: 1 day    Time spent: 35 minutes    Leatrice Chapel, MD  [facility] Pager #: [phone] 7PM-7AM contact night coverage as above

## 2024-07-02 NOTE — Evaluation (Signed)
 Physical Therapy Evaluation Patient Details Name: PRESTON GARABEDIAN [name] MRN: [id number] DOB: 08-Jun-1941 Today's Date: 07/02/2024  History of Present Illness  Adolphe Fortunato. is a 83 y.o. male who presented to [facility] ED 07/01/24 for worsening back pain and BLE weakness. CT abd/pelvis showed infrarenal abdominal aortic aneurysm measuring up to 4.6 cm is enlarged since 2017, 2.4 cm solid exophytic mass at the inferior pole of the left kidney, highly suspicious for renal cell carcinoma (new since 2017), and superior L3 vertebral body lytic lesion with mild endplate compression compatible with mild pathologic fracture, as well as indeterminate mild L4 compression fracture, which may be chronic and benign. PMHx: arthritis, cancer, CAD, HTN, PTSD, PVD, stroke, TB.    Clinical Impression  Pt admitted with above diagnosis. PTA, pt was modI for functional mobility using rollator or SPC for household ambulation and electric scooter for community ambulation. He lives alone in a ground floor apartment with a level entry. Pt currently with functional limitations due to the deficits listed below (see PT Problem List). He required supervision for bed mobility, CGA for sit<>stand using RW, and CGA for gait using RW.  Pt is currently limited by impaired cognition, decreased safety awareness, generalized weakness, decreased balance, and decreased activity tolerance. Pt will benefit from acute skilled PT to increase his independence and safety with mobility to allow discharge. Given the lack of support available and high fall risk recommend continued inpatient follow up therapy, <3 hours/day.    If plan is discharge home, recommend the following: A little help with walking and/or transfers;A little help with bathing/dressing/bathroom;Assistance with cooking/housework;Assist for transportation;Help with stairs or ramp for entrance   Can travel by private vehicle   Yes    Equipment Recommendations None recommended  by PT  Recommendations for Other Services       Functional Status Assessment Patient has had a recent decline in their functional status and demonstrates the ability to make significant improvements in function in a reasonable and predictable amount of time.     Precautions / Restrictions Precautions Precautions: Fall Recall of Precautions/Restrictions: Impaired Precaution/Restrictions Comments: No formal back precautions ordered, followed for comfort and safety. Restrictions Weight Bearing Restrictions Per Provider Order: No      Mobility  Bed Mobility Overal bed mobility: Needs Assistance Bed Mobility: Rolling, Sidelying to Sit, Sit to Sidelying Rolling: Supervision Sidelying to sit: Supervision     Sit to sidelying: Supervision General bed mobility comments: Pt performed log roll technique to get out/in bed. He began to pull on RW. Instructed pt to utilize bed rail and move RW. He raised trunk and scooted fwd. Returning to bed pt swung BLE in and repositioned himself.    Transfers Overall transfer level: Needs assistance Equipment used: Rolling walker (2 wheels) Transfers: Sit to/from Stand Sit to Stand: Contact guard assist           General transfer comment: Pt stood from lowest bed height. Cued proper hand placement. He opted to push up with BUE on RW grip. Educated pt to only have one hand up on AD and the other on the bed. Pt resistant to education saying this is the way he has done things for many years and he knows what he is doing. Fair eccentric control. Instructed pt to reach back to bed. He maintained BUE on RW.    Ambulation/Gait Ambulation/Gait assistance: Contact guard assist Gait Distance (Feet): 40 Feet (x2, standing rest break) Assistive device: Rolling walker (2 wheels) Gait Pattern/deviations: Step-through  pattern, Decreased stride length, Trunk flexed Gait velocity: reduced Gait velocity interpretation: <1.8 ft/sec, indicate of risk for recurrent  falls   General Gait Details: Pt ambulated with slow steps. He had a slight fwd lean, but had good proximity to RW. Pt navigated room/hallway well.  Stairs            Wheelchair Mobility     Tilt Bed    Modified Rankin (Stroke Patients Only)       Balance Overall balance assessment: Needs assistance Sitting-balance support: Feet supported Sitting balance-Leahy Scale: Good     Standing balance support: Bilateral upper extremity supported, During functional activity Standing balance-Leahy Scale: Poor Standing balance comment: Pt dependent on RW                             Pertinent Vitals/Pain Pain Assessment Pain Assessment: Faces Faces Pain Scale: Hurts even more Pain Location: Low Back Pain Descriptors / Indicators: Discomfort, Grimacing, Guarding Pain Intervention(s): Monitored during session, Limited activity within patient's tolerance, Repositioned    Home Living Family/patient expects to be discharged to:: Private residence Living Arrangements: Alone Available Help at Discharge: Other (Comment) (Pt denies any support) Type of Home: Apartment Home Access: Level entry       Home Layout: One level Home Equipment: Cane - single point;Shower seat;BSC/3in1;Electric scooter;Grab bars - toilet;Grab bars - tub/shower;Rollator (4 wheels)      Prior Function Prior Level of Function : Independent/Modified Independent             Mobility Comments: Uses rollator or SPC in the home and scooter outside the home. Denies falls ADLs Comments: ModI. Drives. Manages medications.     Extremity/Trunk Assessment   Upper Extremity Assessment Upper Extremity Assessment: Defer to OT evaluation    Lower Extremity Assessment Lower Extremity Assessment: Generalized weakness    Cervical / Trunk Assessment Cervical / Trunk Assessment: Kyphotic  Communication   Communication Communication: No apparent difficulties    Cognition Arousal: Alert Behavior  During Therapy: Impulsive   PT - Cognitive impairments: No family/caregiver present to determine baseline, Awareness, Sequencing, Problem solving, Safety/Judgement                       PT - Cognition Comments: Pt A,Ox4. He is quick to attempt mobility. Decreased insight into current condition. Poor safety awarness. Mod-max cues for improved sequencing. Following commands: Impaired Following commands impaired: Only follows one step commands consistently, Follows multi-step commands inconsistently     Cueing Cueing Techniques: Verbal cues     General Comments General comments (skin integrity, edema, etc.): VSS on RA    Exercises     Assessment/Plan    PT Assessment Patient needs continued PT services  PT Problem List Decreased strength;Decreased activity tolerance;Decreased balance;Decreased mobility;Decreased safety awareness;Decreased cognition;Decreased knowledge of use of DME;Pain       PT Treatment Interventions DME instruction;Gait training;Functional mobility training;Therapeutic activities;Therapeutic exercise;Balance training;Cognitive remediation;Patient/family education    PT Goals (Current goals can be found in the Care Plan section)  Acute Rehab PT Goals Patient Stated Goal: Go to rehab and get better PT Goal Formulation: With patient Time For Goal Achievement: 07/16/24 Potential to Achieve Goals: Fair    Frequency Min 2X/week     Co-evaluation               AM-PAC PT 6 Clicks Mobility  Outcome Measure Help needed turning from your back to your side while  in a flat bed without using bedrails?: A Little Help needed moving from lying on your back to sitting on the side of a flat bed without using bedrails?: A Little Help needed moving to and from a bed to a chair (including a wheelchair)?: A Little Help needed standing up from a chair using your arms (e.g., wheelchair or bedside chair)?: A Little Help needed to walk in hospital room?: A  Little Help needed climbing 3-5 steps with a railing? : A Lot 6 Click Score: 17    End of Session Equipment Utilized During Treatment: Gait belt Activity Tolerance: Patient tolerated treatment well;Patient limited by fatigue Patient left: in bed;with call bell/phone within reach;with bed alarm set Nurse Communication: Mobility status PT Visit Diagnosis: Muscle weakness (generalized) (M62.81);Difficulty in walking, not elsewhere classified (R26.2);Unsteadiness on feet (R26.81)    Time: 1345-1400 PT Time Calculation (min) (ACUTE ONLY): 15 min   Charges:   PT Evaluation $PT Eval Moderate Complexity: 1 Mod   PT General Charges $$ ACUTE PT VISIT: 1 Visit         Randall SAUNDERS, PT, DPT Acute Rehabilitation Services Office: [phone] Secure Chat Preferred  Delon CHRISTELLA Callander 07/02/2024, 2:20 PM

## 2024-07-03 DIAGNOSIS — M898X9 Other specified disorders of bone, unspecified site: Secondary | ICD-10-CM | POA: Diagnosis not present

## 2024-07-03 NOTE — Progress Notes (Signed)
 PROGRESS NOTE    Roberto Wallace  FMW:[id number] DOB: May 13, 1941 DOA: 07/01/2024 PCP: Norleen Lynwood ORN, MD  Outpatient Specialists:     Brief Narrative:  Patient is an 83 year old male with past medical history significant for coronary artery disease, hyperlipidemia, hypertension, migraine, peripheral vascular disease, stroke, vitamin D  deficiency and metastatic lung cancer (diagnosed in June 2025) status post SBRT.  Patient was admitted with back pain.  07/03/2024: Patient seen.  Pain control is improving.  Patient is not keen on being discharged back home today.  Hopefully, patient will be discharged back on tomorrow.  Assessment & Plan:   Principal Problem:   Lytic lesion of bone on x-ray   L3 lytic lesion Fragility fracture H/o lung cancer -PT/OT consulted -Oncology consulted; awaiting input. -Multimodal pain control with tylenol  1g TID, robaxin 500mg  TID and IV morphine 2mg  q4h prn -Patient continues to report 8 out of 10 pain. - Start OxyContin. - Start oxycodone as needed. -F/u 25-OH vitamin D  and treat if low w/ ergochalciferol 50,000 IU weekly for 8-12 weeks; pt will need 25-OH vitamin D  re-testing as outpt to ensure repletion in 8-12 weeks; may consider ergochalciferol 2000 IU daily after testing in 8-12 weeks for maintenance therapy -Consider bisphosphonate or Prolia at d/c and OP PCP f/u for DEXA scan 07/03/2024: Pain is improving.  Likely discharge back on tomorrow.   Renal mass Presumed RCC -Urology consulted and deferred to OP eval; will need OP Urology referral at discharge   AAA Infrarenal abdominal aortic aneurysm measuring up to 4.6 cm is enlarged since 2017. Recommend CT or MRI in 12 months and establish or continue care with a vascular specialist. -OP vascular surgery f/u at discharge   HTN -PTA losartan  100mg  daily and metoprolol  12.5mg  daily   BPH -PTA Flomax  0.4mg  daily   DVT prophylaxis:  Code Status: Full code Family Communication:   Disposition Plan: Inpatient   Consultants:  None  Procedures:  CT abdomen and pelvis with contrast revealed:  1. Infrarenal abdominal aortic aneurysm measuring up to 4.6 cm is enlarged since 2017. Recommend CT or MRI in 12 months and establish or continue care with a vascular specialist. 2. New since 2017 2.4 cm solid exophytic mass at the inferior pole of the left kidney, highly suspicious for renal cell carcinoma. Renal metastases is less likely. 3. Stable chronic and benign left adrenal adenoma. No other abdominal visceral metastases identified. 4. Superior L3 vertebral body lytic lesion with mild endplate compression compatible with mild pathologic fracture. Indeterminate mild L4 compression fracture, which may be chronic and benign.  CT angio chest with and without contrast revealed: 1. Progressed metastatic lung cancer since September restaging CT in the form of new or increased thoracic spinal metastases (possible early epidural extension at T6), and increased mediastinal infiltrative tumor. 2. No acute pulmonary embolism. 3. CT abdomen and pelvis reported separately.   Antimicrobials:  None.   Subjective: Left lower back pain  Objective: Vitals:   07/02/24 1739 07/02/24 2100 07/03/24 0353 07/03/24 1016  BP: 106/80 101/67 108/75 (!) 127/93  Pulse: 91 78 71 83  Resp: 18 16 18 16   Temp: 97.8 F (36.6 C) 98.7 F (37.1 C) 98.6 F (37 C) 98.1 F (36.7 C)  TempSrc: Oral Oral Oral Oral  SpO2: 100% 100% 100% 100%   No intake or output data in the 24 hours ending 07/03/24 1018  There were no vitals filed for this visit.  Examination:  General exam: Appears calm and comfortable  Respiratory  system: Clear to auscultation. Respiratory effort normal. Cardiovascular system: S1 & S2 heard Gastrointestinal system: Abdomen is soft and nontender.  Central nervous system: Alert and oriented. . Extremities: No leg edema  Data Reviewed: I have personally reviewed  following labs and imaging studies  CBC: Recent Labs  Lab 07/01/24 0522  WBC 5.5  HGB 12.9*  HCT 40.9  MCV 87.8  PLT 208   Basic Metabolic Panel: Recent Labs  Lab 07/01/24 0522  NA 135  K 4.1  CL 98  CO2 23  GLUCOSE 114*  BUN 8  CREATININE 1.19  CALCIUM  9.1   GFR: Estimated Creatinine Clearance: 35.6 mL/min (by C-G formula based on SCr of 1.19 mg/dL). Liver Function Tests: Recent Labs  Lab 07/01/24 0522  AST 21  ALT 10  ALKPHOS 76  BILITOT 0.7  PROT 8.0  ALBUMIN 3.9   Recent Labs  Lab 07/01/24 0522  LIPASE 22   No results for input(s): AMMONIA in the last 168 hours. Coagulation Profile: No results for input(s): INR, PROTIME in the last 168 hours. Cardiac Enzymes: No results for input(s): CKTOTAL, CKMB, CKMBINDEX, TROPONINI in the last 168 hours. BNP (last 3 results) No results for input(s): PROBNP in the last 8760 hours. HbA1C: No results for input(s): HGBA1C in the last 72 hours. CBG: No results for input(s): GLUCAP in the last 168 hours. Lipid Profile: No results for input(s): CHOL, HDL, LDLCALC, TRIG, CHOLHDL, LDLDIRECT in the last 72 hours. Thyroid  Function Tests: No results for input(s): TSH, T4TOTAL, FREET4, T3FREE, THYROIDAB in the last 72 hours. Anemia Panel: No results for input(s): VITAMINB12, FOLATE, FERRITIN, TIBC, IRON, RETICCTPCT in the last 72 hours. Urine analysis:    Component Value Date/Time   COLORURINE YELLOW 07/01/2024 0735   APPEARANCEUR CLEAR 07/01/2024 0735   LABSPEC 1.008 07/01/2024 0735   PHURINE 6.0 07/01/2024 0735   GLUCOSEU NEGATIVE 07/01/2024 0735   GLUCOSEU NEGATIVE 03/07/2024 0925   HGBUR SMALL (A) 07/01/2024 0735   BILIRUBINUR NEGATIVE 07/01/2024 0735   KETONESUR NEGATIVE 07/01/2024 0735   PROTEINUR NEGATIVE 07/01/2024 0735   UROBILINOGEN 0.2 03/07/2024 0925   NITRITE NEGATIVE 07/01/2024 0735   LEUKOCYTESUR NEGATIVE 07/01/2024 0735   Sepsis  Labs: @LABRCNTIP (procalcitonin:4,lacticidven:4)  )No results found for this or any previous visit (from the past 240 hours).       Radiology Studies: No results found.       Scheduled Meds:  acetaminophen   1,000 mg Oral TID   aspirin  EC  81 mg Oral Daily   ezetimibe   10 mg Oral Daily   feeding supplement  237 mL Oral BID BM   lidocaine   1 patch Transdermal Q24H   losartan   100 mg Oral Daily   memantine   5 mg Oral BID   methocarbamol  500 mg Oral TID   metoprolol  succinate  12.5 mg Oral QHS   oxyCODONE  10 mg Oral Q12H   sertraline   25 mg Oral Daily   tamsulosin   0.4 mg Oral Daily   Continuous Infusions:   LOS: 2 days    Time spent: 35 minutes    Leatrice Chapel, MD  [facility] Pager #: [phone] 7PM-7AM contact night coverage as above

## 2024-07-03 NOTE — TOC Progression Note (Signed)
 Transition of Care (TOC) - Progression Note    Patient Details  Name: Roberto POCOCK [name] MRN: [id number] Date of Birth: 02/05/1941  [facility] CM/SW Contact  Robynn Eileen Hoose, RN Phone Number: 07/03/2024, 2:57 PM  Clinical Narrative:   Secure message from CSW regarding patient wanting home health. Spoke with patient, patient reports that he just needs transportation assistance when he is discharged. Patient wheelchair is inside his home. Patient with possible discharge tomorrow.    Expected Discharge Plan:  (wants to return home interested in speaking with CM for home needs) Barriers to Discharge: Continued Medical Work up               Expected Discharge Plan and Services In-house Referral: Clinical Social Work     Living arrangements for the past 2 months: Apartment                                       Social Drivers of Health (SDOH) Interventions SDOH Screenings   Food Insecurity: No Food Insecurity (07/01/2024)  Housing: Low Risk  (07/01/2024)  Transportation Needs: No Transportation Needs (07/01/2024)  Utilities: Not At Risk (07/01/2024)  Alcohol Screen: Low Risk  (08/17/2023)  Depression (PHQ2-9): Low Risk  (01/14/2024)  Financial Resource Strain: Low Risk  (08/17/2023)  Physical Activity: Inactive (08/17/2023)  Social Connections: Socially Isolated (07/01/2024)  Stress: No Stress Concern Present (08/17/2023)  Tobacco Use: Medium Risk (06/20/2024)  Health Literacy: Adequate Health Literacy (08/17/2023)    Readmission Risk Interventions     No data to display

## 2024-07-03 NOTE — Plan of Care (Signed)
   Problem: Activity: Goal: Risk for activity intolerance will decrease Outcome: Progressing   Problem: Pain Managment: Goal: General experience of comfort will improve and/or be controlled Outcome: Progressing   Problem: Safety: Goal: Ability to remain free from injury will improve Outcome: Progressing

## 2024-07-03 NOTE — TOC Initial Note (Addendum)
 Transition of Care (TOC) - Initial/Assessment Note    Patient Details  Name: Roberto TRICKEL [name] MRN: [id number] Date of Birth: 10-29-1940  Transition of Care [facility]) CM/SW Contact:    Isaiah Public, LCSWA Phone Number: 07/03/2024, 2:51 PM  Clinical Narrative:                  CSW received consult for possible SNF placement at time of discharge. CSW spoke with patient regarding PT recommendation of SNF placement at time of discharge. Patient reports PTA he comes from home alone Patient expressed understanding of PT recommendation and politely declined SNF placement at time of discharge. CSW informed patient that CM will follow up for any home needs.CSW informed Keshia CM. Patient reports he will need transportation when he is medically ready for dc. All questions answered.No further questions reported at this time. CSW to continue to follow and assist with discharge planning needs.   Expected Discharge Plan:  (wants to return home interested in speaking with CM for home needs) Barriers to Discharge: Continued Medical Work up   Patient Goals and CMS Choice Patient states their goals for this hospitalization and ongoing recovery are:: wants to return home   Choice offered to / list presented to : Patient      Expected Discharge Plan and Services In-house Referral: Clinical Social Work     Living arrangements for the past 2 months: Apartment                                      Prior Living Arrangements/Services Living arrangements for the past 2 months: Apartment Lives with:: Self Patient language and need for interpreter reviewed:: Yes Do you feel safe going back to the place where you live?: Yes            Criminal Activity/Legal Involvement Pertinent to Current Situation/Hospitalization: No - Comment as needed  Activities of Daily Living   ADL Screening (condition at time of admission) Independently performs ADLs?: No Does the patient have a NEW difficulty  with bathing/dressing/toileting/self-feeding that is expected to last >3 days?: Yes (Initiates electronic notice to provider for possible OT consult) Does the patient have a NEW difficulty with getting in/out of bed, walking, or climbing stairs that is expected to last >3 days?: Yes (Initiates electronic notice to provider for possible PT consult) Does the patient have a NEW difficulty with communication that is expected to last >3 days?: No Is the patient deaf or have difficulty hearing?: No Does the patient have difficulty seeing, even when wearing glasses/contacts?: No Does the patient have difficulty concentrating, remembering, or making decisions?: Yes (mild Dementia)  Permission Sought/Granted Permission sought to share information with : Case Manager, Magazine Features Editor, Family Supports                Emotional Assessment   Attitude/Demeanor/Rapport: Gracious Affect (typically observed): Calm Orientation: : Oriented to Self, Oriented to Place, Oriented to  Time, Oriented to Situation Alcohol / Substance Use: Not Applicable Psych Involvement: No (comment)  Admission diagnosis:  Lytic lesion of bone on x-ray [M89.8X9] Patient Active Problem List   Diagnosis Date Noted   Lytic lesion of bone on x-ray 07/01/2024   Pulmonary nodule 05/31/2024   Mediastinal adenopathy 05/31/2024   DDD (degenerative disc disease), lumbar 03/07/2024   Right leg pain 03/07/2024   Urinary frequency 03/07/2024   Malignant neoplasm of upper lobe of left  lung (HCC) 02/16/2024   Moderate protein-calorie malnutrition 01/15/2024   Chronic obstructive pulmonary disease, unspecified (HCC) 01/14/2024   Complete loss of teeth due to caries, class I 01/14/2024   Nontraumatic incomplete tear of right rotator cuff 01/14/2024   Other abnormalities of gait and mobility 01/14/2024   Lesion of skin of face 05/10/2022   Peripheral arterial occlusive disease 09/23/2021   Major depressive disorder,  recurrent episode 09/23/2021   Inguinal hernia 09/23/2021   Hypertensive heart and chronic kidney disease with heart failure and stage 1 through stage 4 chronic kidney disease, or unspecified chronic kidney disease (HCC) 09/23/2021   Headache 09/23/2021   Glaucoma 09/23/2021   Cocaine abuse (HCC) 09/23/2021   Claudication 09/23/2021   Acquired deformities of toe(s), unspecified, left foot 09/23/2021   Gait disorder 09/23/2021   BPH (benign prostatic hyperplasia) 07/12/2021   NSVT (nonsustained ventricular tachycardia) (HCC) 07/12/2021   Near syncope 06/03/2021   Pedal edema 01/01/2021   Nail disorder 01/01/2021   Homelessness 12/12/2020   Microhematuria 02/01/2020   Allergic rhinitis 02/01/2020   History of colonic polyps 02/01/2020   Vitamin D  deficiency 03/12/2019   B12 deficiency 03/12/2019   Hyperglycemia 03/10/2019   Cough 03/10/2019   History of exposure to asbestos 03/10/2019   Hyperlipidemia 10/16/2017   Depression 10/16/2017   Stroke-like symptoms 10/16/2017   Mild dementia (HCC) 10/16/2017   Lateral epicondylitis, right elbow 06/26/2017   PTSD (post-traumatic stress disorder) 06/24/2017   Right wrist pain 06/09/2016   Osteoarthritis 02/05/2016   Tuberculosis 02/05/2016   Loss of weight 10/31/2015   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   Neuropathy 10/03/2015   Intermittent memory loss 10/03/2015   Decreased mobility 10/03/2015   Encounter for well adult exam with abnormal findings 04/16/2015   Medicare annual wellness visit, subsequent 04/16/2015   TOBACCO USER 06/14/2010   Anxiety and depression 11/19/2009   HYPERTENSION, BENIGN ESSENTIAL 11/19/2009   Other specified cardiac dysrhythmias(427.89) 11/19/2009   GEN OSTEOARTHROSIS INVOLVING MULTIPLE SITES 11/19/2009   History of cardiovascular disorder 11/19/2009   Esophageal reflux 08/25/1997   Backache 08/26/1975   PCP:  Norleen Lynwood ORN, MD Pharmacy:   [id number] -  [address] ([place]), [place] - [address] [place] ([place]) [place] [address] Phone: [phone] Fax: [phone]  [facility] - [place], [place] - [address] [place] [address] Phone: [phone] Fax: [phone]  [facility] [address] [place] [address] Phone: [phone] Fax: [phone]     Social Drivers of Health (SDOH) Social History: SDOH Screenings   Food Insecurity: No Food Insecurity (07/01/2024)  Housing: Low Risk  (07/01/2024)  Transportation Needs: No Transportation Needs (07/01/2024)  Utilities: Not At Risk (07/01/2024)  Alcohol Screen: Low Risk  (08/17/2023)  Depression (PHQ2-9): Low Risk  (01/14/2024)  Financial Resource Strain: Low Risk  (08/17/2023)  Physical Activity: Inactive (08/17/2023)  Social Connections: Socially Isolated (07/01/2024)  Stress: No Stress Concern Present (08/17/2023)  Tobacco Use: Medium Risk (06/20/2024)  Health Literacy: Adequate Health Literacy (08/17/2023)   SDOH Interventions:     Readmission Risk Interventions     No data to display

## 2024-07-03 NOTE — Plan of Care (Signed)

## 2024-07-04 ENCOUNTER — Other Ambulatory Visit (HOSPITAL_COMMUNITY): Payer: Self-pay

## 2024-07-04 DIAGNOSIS — M898X9 Other specified disorders of bone, unspecified site: Secondary | ICD-10-CM | POA: Diagnosis not present

## 2024-07-04 MED ORDER — METHOCARBAMOL 500 MG PO TABS
500.0000 mg | ORAL_TABLET | Freq: Three times a day (TID) | ORAL | 1 refills | Status: AC
  Filled 2024-07-04: qty 90, 30d supply, fill #0

## 2024-07-04 MED ORDER — ENSURE PLUS HIGH PROTEIN PO LIQD
237.0000 mL | Freq: Two times a day (BID) | ORAL | 2 refills | Status: AC
  Filled 2024-07-04: qty 14220, 30d supply, fill #0

## 2024-07-04 MED ORDER — ENOXAPARIN SODIUM 40 MG/0.4ML IJ SOSY: 40.0000 mg | PREFILLED_SYRINGE | INTRAMUSCULAR | Status: DC

## 2024-07-04 MED ORDER — OXYCODONE HCL 5 MG PO TABS
5.0000 mg | ORAL_TABLET | Freq: Four times a day (QID) | ORAL | 0 refills | Status: AC | PRN
  Filled 2024-07-04: qty 30, 7d supply, fill #0

## 2024-07-04 MED ORDER — MEMANTINE HCL 5 MG PO TABS
5.0000 mg | ORAL_TABLET | Freq: Two times a day (BID) | ORAL | 1 refills | Status: AC
  Filled 2024-07-04: qty 60, 30d supply, fill #0

## 2024-07-04 MED ORDER — OXYCODONE HCL ER 10 MG PO T12A
10.0000 mg | EXTENDED_RELEASE_TABLET | Freq: Two times a day (BID) | ORAL | 0 refills | Status: DC
  Filled 2024-07-04: qty 10, 5d supply, fill #0

## 2024-07-04 MED ORDER — LIDOCAINE 5 % EX PTCH
1.0000 | MEDICATED_PATCH | CUTANEOUS | 2 refills | Status: AC
  Filled 2024-07-04: qty 30, 30d supply, fill #0

## 2024-07-04 MED ORDER — MORPHINE SULFATE ER 15 MG PO TBCR
15.0000 mg | EXTENDED_RELEASE_TABLET | Freq: Two times a day (BID) | ORAL | 0 refills | Status: AC
  Filled 2024-07-04: qty 60, 30d supply, fill #0

## 2024-07-04 NOTE — Plan of Care (Signed)

## 2024-07-04 NOTE — TOC Progression Note (Signed)
 Transition of Care (TOC) - Progression Note    Patient Details  Name: Roberto CAMPOY [name] MRN: [id number] Date of Birth: 01/17/1941  Transition of Care [facility]) CM/SW Contact  Alyana Kreiter LITTIE Moose, [place] Phone Number: 07/04/2024, 12:22 PM  Clinical Narrative:    PT worked with pt on Saturday and pt Specifically requested SNF following hospital dc. CSW spoke with pt about SNF following hospital discharge- pt stated he wants to go home and does not want to go to a facility. CSW informed RNCM. CSW will continue to follow for needs.  Expected Discharge Plan:  (wants to return home interested in speaking with CM for home needs) Barriers to Discharge: Continued Medical Work up               Expected Discharge Plan and Services In-house Referral: Clinical Social Work     Living arrangements for the past 2 months: Apartment                                       Social Drivers of Health (SDOH) Interventions SDOH Screenings   Food Insecurity: No Food Insecurity (07/01/2024)  Housing: Low Risk  (07/01/2024)  Transportation Needs: No Transportation Needs (07/01/2024)  Utilities: Not At Risk (07/01/2024)  Alcohol Screen: Low Risk  (08/17/2023)  Depression (PHQ2-9): Low Risk  (01/14/2024)  Financial Resource Strain: Low Risk  (08/17/2023)  Physical Activity: Inactive (08/17/2023)  Social Connections: Socially Isolated (07/01/2024)  Stress: No Stress Concern Present (08/17/2023)  Tobacco Use: Medium Risk (06/20/2024)  Health Literacy: Adequate Health Literacy (08/17/2023)    Readmission Risk Interventions     No data to display

## 2024-07-04 NOTE — Progress Notes (Addendum)
 Discharge Nurse Summary: DC order noted per MD. DC RN at bedside with patient. Patient agreeable with discharge plan, transportation coordinated by CM. AVS printed/reviewed. PIV removed, skin intact. No DME needs. No home meds. TOC meds delivered to the patient. CP/Edu resolved. Telemonitor not present on assessment. All belongings accounted for. Med necessity set in separate discharge packet with patient chart. Ready for discharge when transport arrives.  Rosario EMERSON Lund, RN

## 2024-07-04 NOTE — Discharge Summary (Signed)
 Physician Discharge Summary  Patient ID: Roberto CIANO [name] MRN: [id number] DOB/AGE: 81/30/1942 83 y.o.  Admit date: 07/01/2024 Discharge date: 07/04/2024  Admission Diagnoses:  Discharge Diagnoses:  Principal Problem:   Lytic lesion of bone on x-ray Diagnosis/problems: - Infrarenal aortic aneurysm (4 to 6 cm) - Left renal mass, worrisome for possible malignancy. - Stable on chronic benign left adrenal adenoma. - Mild L3 endplate compression, compatible with mild pathological fracture. - Indeterminate mild L4 compression fracture.   Discharged Condition: stable  Hospital Course: Patient is an 83 year old male with past medical history significant for coronary artery disease, hyperlipidemia, hypertension, migraine, peripheral vascular disease, stroke, vitamin D  deficiency and metastatic lung cancer (diagnosed in June 2025) status post SBRT. Patient was admitted with back pain.   L3 lytic lesion Fragility fracture H/o lung cancer -PT/OT consulted -Oncology consulted on admission.   -Multimodal pain control  -Back pain has improved seen currently. -Follow-up with oncology team on the.  Renal mass: Presumed renal cell carcinoma:  -Follow-up with urology team on discharge.     Abdominal aortic aneurysm:  Infrarenal abdominal aortic aneurysm measuring up to 4.6 cm is enlarged since 2017. Recommend CT or MRI in 12 months and establish or continue care with a vascular specialist. -Follow-up with vascular surgery team on discharge.     Hypertension:  -Blood pressure is controlled.   BPH -Flomax  0.4mg  daily    Consults: None  Significant Diagnostic Studies:  CT abdomen and pelvis with contrast revealed: 1. Infrarenal abdominal aortic aneurysm measuring up to 4.6 cm is enlarged since 2017. Recommend CT or MRI in 12 months and establish or continue care with a vascular specialist. 2. New since 2017 2.4 cm solid exophytic mass at the inferior pole of the left kidney,  highly suspicious for renal cell carcinoma. Renal metastases is less likely. 3. Stable chronic and benign left adrenal adenoma. No other abdominal visceral metastases identified. 4. Superior L3 vertebral body lytic lesion with mild endplate compression compatible with mild pathologic fracture. Indeterminate mild L4 compression fracture, which may be chronic and benign.  CTA chest revealed:  1. Progressed metastatic lung cancer since September restaging CT in the form of new or increased thoracic spinal metastases (possible early epidural extension at T6), and increased mediastinal infiltrative tumor. 2. No acute pulmonary embolism.  Chest x-ray revealed: 1. Stable bilateral lung scarring. 2. Old left rib fractures.     Treatments: Adequate pain control.  Discharge Exam: Blood pressure 138/86, pulse 80, temperature 98.3 F (36.8 C), temperature source Oral, resp. rate 18, SpO2 100%.   Disposition:  Discharge disposition: 01-Home or Self Care       Discharge Instructions     Diet - low sodium heart healthy   Complete by: As directed    Increase activity slowly   Complete by: As directed       Allergies as of 07/04/2024       Reactions   Amlodipine Rash   Brinzolamide-brimonidine Other (See Comments)   Not allergic per pt.    Pollen Extract Other (See Comments)   Sinusitis        Medication List     STOP taking these medications    atorvastatin  80 MG tablet Commonly known as: LIPITOR    fexofenadine  180 MG tablet Commonly known as: ALLEGRA    Prevagen 10 MG Caps Generic drug: Apoaequorin   Tiotropium Bromide 1.25 MCG/ACT Aers   VITA-MIN PO   vitamin B-12 500 MCG tablet Commonly known as: CYANOCOBALAMIN   TAKE these medications    aspirin  EC 81 MG tablet Take 1 tablet (81 mg total) by mouth daily. Swallow whole.   ezetimibe  10 MG tablet Commonly known as: ZETIA  Take 1 tablet (10 mg total) by mouth daily.   feeding supplement  Liqd Take 237 mLs by mouth 2 (two) times daily between meals. Start taking on: July 05, 2024   lidocaine  5 % Commonly known as: LIDODERM  Place 1 patch onto the skin daily. Remove & Discard patch within 12 hours or as directed by MD   losartan  100 MG tablet Commonly known as: COZAAR  Take 1 tablet by mouth once daily   memantine  5 MG tablet Commonly known as: NAMENDA  Take 1 tablet (5 mg total) by mouth 2 (two) times daily.   methocarbamol 500 MG tablet Commonly known as: ROBAXIN Take 1 tablet (500 mg total) by mouth 3 (three) times daily.   metoprolol  succinate 25 MG 24 hr tablet Commonly known as: Toprol  XL Take 0.5 tablets (12.5 mg total) by mouth at bedtime.   morphine 15 MG 12 hr tablet Commonly known as: MS CONTIN Take 1 tablet (15 mg total) by mouth every 12 (twelve) hours.   oxyCODONE 5 MG immediate release tablet Commonly known as: Oxy IR/ROXICODONE Take 1 tablet (5 mg total) by mouth every 6 (six) hours as needed for severe pain (pain score 7-10) or breakthrough pain.   sertraline  25 MG tablet Commonly known as: ZOLOFT  Take 1 tablet (25 mg total) by mouth daily.   tamsulosin  0.4 MG Caps capsule Commonly known as: FLOMAX  Take 0.4 mg by mouth at bedtime.        Follow-up Information     Norleen Lynwood ORN, MD Follow up in 1 week(s).   Specialties: Internal Medicine, Radiology Contact information: [address] [place] [address] [phone]         [facility] Follow up in 4 week(s).   Specialty: Vascular Surgery Why: Aortic aneurysm, infrarenal Contact information: [address] [place]  [address] [phone]        [facility] Follow up in 2 week(s).   Why: Possible renal cancer Contact information: [address] [place]  [address] [phone]              Follow-up with the [facility] oncology team.  Time  spent: 35 minutes.   SignedBETHA Leatrice LILLETTE Rosario 07/04/2024, 2:27 PM

## 2024-07-04 NOTE — Plan of Care (Signed)

## 2024-07-04 NOTE — Progress Notes (Signed)
 Mobility Specialist Progress Note:    07/04/24 0951  Mobility  Activity Ambulated with assistance (In hallway)  Level of Assistance Standby assist, set-up cues, supervision of patient - no hands on  Assistive Device Front wheel walker  Distance Ambulated (ft) 100 ft  Activity Response Tolerated well  Mobility Referral Yes  Mobility visit 1 Mobility  Mobility Specialist Start Time (ACUTE ONLY) [id number]  Mobility Specialist Stop Time (ACUTE ONLY) 0949  Mobility Specialist Time Calculation (min) (ACUTE ONLY) 13 min   Received pt in bed and agreeable to mobility. Pt required MinG for safety. Pt c/o back pain, otherwise tolerated well. Returned to room without fault. Pt left in bed with alarm on. Personal belongings and call light within reach. All needs met.  Lavanda Pollack Mobility Specialist  Please contact via [employer] or  Rehab Office [phone]

## 2024-07-04 NOTE — TOC Progression Note (Addendum)
 Transition of Care (TOC) - Progression Note   Per chart patient declining SNF and needing transportation home at discharge. NCM spoke to patient at bedside and discussed recommendation for SNF at discharge . Patient stated he is interested in SNF at discharge as long as [facility] appro[facility] it and he can continue cancer treatments . NCM secure chatted SW and bedside nurse .   Patient lives alone. He did walk 100 feet this am with mobility using a walker   Called Rehab office, no one will see him today since he was seen on the weekend, they will see him tomorrow .   1236 NCM and SW spoke to patient at bedside. Patient wants to go home at discharge , he does NOT want home health. He is requesting ambulance transportation home. He has his home keys. NCM secure chatted team including attending MD.   Patient told nurse he wants prescriptions sent to [facility] and they will deliver. Does not want [facility]   Patient ready for discharge requesting ambulance has keys to get in, confirmed address [facility] called paperwork on chart   Prescriptions were [facility] to [facility]. Called Diann they are in process and will be ready for pick before [facility] arrives. She will chat nurse when ready  Patient Details  Name: Roberto Wallace [name] MRN: [id number] Date of Birth: [birth year]  Transition of Care [facility]) CM/SW Contact  Kerrilynn Derenzo, Powell Jansky, RN Phone Number: 07/04/2024, 10:23 AM  Clinical Narrative:       Expected Discharge Plan:  (wants to return home interested in speaking with CM for home needs) Barriers to Discharge: Continued Medical Work up               Expected Discharge Plan and Services In-house Referral: Clinical Social Work     Living arrangements for the past 2 months: Apartment                                       Social Drivers of Health (SDOH) Interventions SDOH Screenings   Food Insecurity: No Food Insecurity (07/01/2024)  Housing: Low Risk  (07/01/2024)  Transportation  Needs: No Transportation Needs (07/01/2024)  Utilities: Not At Risk (07/01/2024)  Alcohol Screen: Low Risk  (08/17/2023)  Depression (PHQ2-9): Low Risk  (01/14/2024)  Financial Resource Strain: Low Risk  (08/17/2023)  Physical Activity: Inactive (08/17/2023)  Social Connections: Socially Isolated (07/01/2024)  Stress: No Stress Concern Present (08/17/2023)  Tobacco Use: Medium Risk (06/20/2024)  Health Literacy: Adequate Health Literacy (08/17/2023)    Readmission Risk Interventions     No data to display

## 2024-07-05 ENCOUNTER — Telehealth: Payer: Self-pay

## 2024-07-05 NOTE — Transitions of Care (Post Inpatient/ED Visit) (Signed)
 07/05/2024  Name: Roberto WEICK [name] MRN: [id number] DOB: Aug 26, 1940  Today's TOC FU Call Status: Today's TOC FU Call Status:: Successful TOC FU Call Completed TOC FU Call Complete Date: 07/05/24  Patient's Name and Date of Birth confirmed. Name, DOB  Transition Care Management Follow-up Telephone Call Date of Discharge: 07/04/24 Discharge Facility: [facility]) Type of Discharge: Inpatient Admission Primary Inpatient Discharge Diagnosis:: neoplasm of bone How have you been since you were released from the hospital?: Same Any questions or concerns?: No  Items Reviewed: Did you receive and understand the discharge instructions provided?: Yes Medications obtained,verified, and reconciled?: Yes (Medications Reviewed) Any new allergies since your discharge?: No Dietary orders reviewed?: Yes Do you have support at home?: No  Medications Reviewed Today: Medications Reviewed Today     Reviewed by Emmitt Pan, LPN (Licensed Practical Nurse) on 07/05/24 at 1024  Med List Status: <None>   Medication Order Taking? Sig Documenting Provider Last Dose Status Informant  aspirin  EC 81 MG tablet [id number] Yes Take 1 tablet (81 mg total) by mouth daily. Swallow whole. Norleen Lynwood ORN, MD  Active Self, Pharmacy Records  ezetimibe  (ZETIA ) 10 MG tablet [id number] Yes Take 1 tablet (10 mg total) by mouth daily. Thukkani, Arun K, MD  Active Self, Pharmacy Records  feeding supplement (ENSURE PLUS HIGH PROTEIN) LIQD [id number] Yes Take 237 mLs by mouth 2 (two) times daily between meals. Rosario Eland I, MD  Active   lidocaine  (LIDODERM ) 5 % [id number] Yes Place 1 patch onto the skin daily. Remove & Discard patch within 12 hours or as directed by MD Rosario Eland FERNS, MD  Active   losartan  (COZAAR ) 100 MG tablet [id number] Yes Take 1 tablet by mouth once daily Dick, Ernest H Jr., NP  Active Self, Pharmacy Records  memantine  (NAMENDA ) 5 MG tablet [id number] Yes Take 1 tablet (5 mg total) by  mouth 2 (two) times daily. Rosario Eland FERNS, MD  Active   methocarbamol (ROBAXIN) 500 MG tablet [id number] Yes Take 1 tablet (500 mg total) by mouth 3 (three) times daily. Rosario Eland FERNS, MD  Active   metoprolol  succinate (TOPROL  XL) 25 MG 24 hr tablet [id number] Yes Take 0.5 tablets (12.5 mg total) by mouth at bedtime. Wyn Jackee VEAR Mickey., NP  Active Self, Pharmacy Records           Med Note ELSWORTH, JACQUELINE L   Thu Dec 11, 2022  9:41 AM)    morphine (MS CONTIN) 15 MG 12 hr tablet [id number] Yes Take 1 tablet (15 mg total) by mouth every 12 (twelve) hours. Rosario Eland FERNS, MD  Active   oxyCODONE (OXY IR/ROXICODONE) 5 MG immediate release tablet [id number] Yes Take 1 tablet (5 mg total) by mouth every 6 (six) hours as needed for severe pain (pain score 7-10) or breakthrough pain. Rosario Eland I, MD  Active   sertraline  (ZOLOFT ) 25 MG tablet [id number] Yes Take 1 tablet (25 mg total) by mouth daily. Norleen Lynwood ORN, MD  Active Self, Pharmacy Records  tamsulosin  (FLOMAX ) 0.4 MG CAPS capsule [id number] Yes Take 0.4 mg by mouth at bedtime. [provider]  Active Self, Pharmacy Records  Med List Note (Cruthis, Chloe, CPhT 07/01/24 1014): [facility]             Home Care and Equipment/Supplies: Were Home Health Services Ordered?: NA Any new equipment or medical supplies ordered?: NA  Functional Questionnaire: Do you need assistance with bathing/showering or dressing?: No Do you need assistance with meal preparation?:  No Do you need assistance with eating?: No Do you have difficulty maintaining continence: No Do you need assistance with getting out of bed/getting out of a chair/moving?: No Do you have difficulty managing or taking your medications?: No  Follow up appointments reviewed: PCP Follow-up appointment confirmed?: No (going to [place] in [place]) MD Provider Line Number:[phone] Given: No Specialist Hospital Follow-up appointment confirmed?: No Reason Specialist  Follow-Up Not Confirmed: Patient has Specialist Provider Number and will Call for Appointment Do you need transportation to your follow-up appointment?: No Do you understand care options if your condition(s) worsen?: Yes-patient verbalized understanding    SIGNATURE Julian Lemmings, LPN [facility] Nurse Health Advisor Direct Dial [phone]

## 2024-08-02 ENCOUNTER — Other Ambulatory Visit: Payer: Self-pay | Admitting: Internal Medicine

## 2024-08-08 ENCOUNTER — Inpatient Hospital Stay (HOSPITAL_COMMUNITY)
Admission: EM | Admit: 2024-08-08 | Discharge: 2024-08-11 | DRG: 683 | Disposition: A | Discharge status: Home Health | Attending: Internal Medicine | Admitting: Internal Medicine

## 2024-08-08 ENCOUNTER — Emergency Department (HOSPITAL_COMMUNITY)

## 2024-08-08 ENCOUNTER — Encounter (HOSPITAL_COMMUNITY): Payer: Self-pay

## 2024-08-08 ENCOUNTER — Other Ambulatory Visit: Payer: Self-pay

## 2024-08-08 ENCOUNTER — Other Ambulatory Visit (HOSPITAL_COMMUNITY): Payer: Self-pay

## 2024-08-08 DIAGNOSIS — F4310 Post-traumatic stress disorder, unspecified: Secondary | ICD-10-CM | POA: Diagnosis present

## 2024-08-08 DIAGNOSIS — K5641 Fecal impaction: Secondary | ICD-10-CM | POA: Diagnosis not present

## 2024-08-08 DIAGNOSIS — N179 Acute kidney failure, unspecified: Secondary | ICD-10-CM | POA: Diagnosis present

## 2024-08-08 DIAGNOSIS — R1030 Lower abdominal pain, unspecified: Secondary | ICD-10-CM

## 2024-08-08 DIAGNOSIS — Z8619 Personal history of other infectious and parasitic diseases: Secondary | ICD-10-CM

## 2024-08-08 DIAGNOSIS — C3490 Malignant neoplasm of unspecified part of unspecified bronchus or lung: Secondary | ICD-10-CM | POA: Diagnosis not present

## 2024-08-08 DIAGNOSIS — R1084 Generalized abdominal pain: Secondary | ICD-10-CM

## 2024-08-08 DIAGNOSIS — I7143 Infrarenal abdominal aortic aneurysm, without rupture: Secondary | ICD-10-CM | POA: Diagnosis present

## 2024-08-08 DIAGNOSIS — F32A Depression, unspecified: Secondary | ICD-10-CM | POA: Diagnosis present

## 2024-08-08 DIAGNOSIS — Z87891 Personal history of nicotine dependence: Secondary | ICD-10-CM

## 2024-08-08 DIAGNOSIS — C3412 Malignant neoplasm of upper lobe, left bronchus or lung: Secondary | ICD-10-CM | POA: Diagnosis present

## 2024-08-08 DIAGNOSIS — Z5941 Food insecurity: Secondary | ICD-10-CM

## 2024-08-08 DIAGNOSIS — J8410 Pulmonary fibrosis, unspecified: Secondary | ICD-10-CM | POA: Diagnosis present

## 2024-08-08 DIAGNOSIS — K5289 Other specified noninfective gastroenteritis and colitis: Secondary | ICD-10-CM

## 2024-08-08 DIAGNOSIS — Z604 Social exclusion and rejection: Secondary | ICD-10-CM | POA: Diagnosis present

## 2024-08-08 DIAGNOSIS — S32030A Wedge compression fracture of third lumbar vertebra, initial encounter for closed fracture: Secondary | ICD-10-CM | POA: Diagnosis not present

## 2024-08-08 DIAGNOSIS — I10 Essential (primary) hypertension: Secondary | ICD-10-CM

## 2024-08-08 DIAGNOSIS — E7800 Pure hypercholesterolemia, unspecified: Secondary | ICD-10-CM | POA: Diagnosis present

## 2024-08-08 DIAGNOSIS — C7951 Secondary malignant neoplasm of bone: Secondary | ICD-10-CM | POA: Diagnosis not present

## 2024-08-08 DIAGNOSIS — M1990 Unspecified osteoarthritis, unspecified site: Secondary | ICD-10-CM | POA: Diagnosis present

## 2024-08-08 DIAGNOSIS — Z8611 Personal history of tuberculosis: Secondary | ICD-10-CM

## 2024-08-08 DIAGNOSIS — Z8679 Personal history of other diseases of the circulatory system: Secondary | ICD-10-CM | POA: Diagnosis not present

## 2024-08-08 DIAGNOSIS — I2510 Atherosclerotic heart disease of native coronary artery without angina pectoris: Secondary | ICD-10-CM | POA: Diagnosis present

## 2024-08-08 DIAGNOSIS — Z9889 Other specified postprocedural states: Secondary | ICD-10-CM

## 2024-08-08 DIAGNOSIS — Z820 Family history of epilepsy and other diseases of the nervous system: Secondary | ICD-10-CM

## 2024-08-08 DIAGNOSIS — M549 Dorsalgia, unspecified: Secondary | ICD-10-CM | POA: Diagnosis present

## 2024-08-08 DIAGNOSIS — Z79899 Other long term (current) drug therapy: Secondary | ICD-10-CM

## 2024-08-08 DIAGNOSIS — N400 Enlarged prostate without lower urinary tract symptoms: Secondary | ICD-10-CM | POA: Diagnosis present

## 2024-08-08 DIAGNOSIS — Z91048 Other nonmedicinal substance allergy status: Secondary | ICD-10-CM

## 2024-08-08 DIAGNOSIS — D649 Anemia, unspecified: Secondary | ICD-10-CM | POA: Diagnosis present

## 2024-08-08 DIAGNOSIS — R634 Abnormal weight loss: Secondary | ICD-10-CM | POA: Diagnosis present

## 2024-08-08 DIAGNOSIS — I739 Peripheral vascular disease, unspecified: Secondary | ICD-10-CM | POA: Diagnosis present

## 2024-08-08 DIAGNOSIS — G893 Neoplasm related pain (acute) (chronic): Secondary | ICD-10-CM | POA: Diagnosis not present

## 2024-08-08 DIAGNOSIS — Z888 Allergy status to other drugs, medicaments and biological substances status: Secondary | ICD-10-CM

## 2024-08-08 DIAGNOSIS — E876 Hypokalemia: Secondary | ICD-10-CM | POA: Diagnosis present

## 2024-08-08 DIAGNOSIS — I723 Aneurysm of iliac artery: Secondary | ICD-10-CM | POA: Diagnosis present

## 2024-08-08 DIAGNOSIS — Z66 Do not resuscitate: Secondary | ICD-10-CM | POA: Diagnosis present

## 2024-08-08 DIAGNOSIS — Z7982 Long term (current) use of aspirin: Secondary | ICD-10-CM

## 2024-08-08 DIAGNOSIS — E872 Acidosis, unspecified: Secondary | ICD-10-CM | POA: Diagnosis present

## 2024-08-08 DIAGNOSIS — Z9221 Personal history of antineoplastic chemotherapy: Secondary | ICD-10-CM

## 2024-08-08 DIAGNOSIS — T402X5A Adverse effect of other opioids, initial encounter: Secondary | ICD-10-CM | POA: Diagnosis present

## 2024-08-08 DIAGNOSIS — Z8673 Personal history of transient ischemic attack (TIA), and cerebral infarction without residual deficits: Secondary | ICD-10-CM

## 2024-08-08 DIAGNOSIS — M4856XA Collapsed vertebra, not elsewhere classified, lumbar region, initial encounter for fracture: Secondary | ICD-10-CM | POA: Diagnosis present

## 2024-08-08 DIAGNOSIS — G43909 Migraine, unspecified, not intractable, without status migrainosus: Secondary | ICD-10-CM | POA: Diagnosis present

## 2024-08-08 DIAGNOSIS — I959 Hypotension, unspecified: Secondary | ICD-10-CM | POA: Diagnosis present

## 2024-08-08 DIAGNOSIS — J439 Emphysema, unspecified: Secondary | ICD-10-CM | POA: Diagnosis present

## 2024-08-08 DIAGNOSIS — Z681 Body mass index (BMI) 19 or less, adult: Secondary | ICD-10-CM

## 2024-08-08 DIAGNOSIS — Z8042 Family history of malignant neoplasm of prostate: Secondary | ICD-10-CM

## 2024-08-08 LAB — COMPREHENSIVE METABOLIC PANEL WITH GFR
ALT: 22 U/L (ref 0–44)
AST: 33 U/L (ref 15–41)
Albumin: 3.2 g/dL — ABNORMAL LOW (ref 3.5–5.0)
Alkaline Phosphatase: 74 U/L (ref 38–126)
Anion gap: 20 — ABNORMAL HIGH (ref 5–15)
BUN: 35 mg/dL — ABNORMAL HIGH (ref 8–23)
CO2: 20 mmol/L — ABNORMAL LOW (ref 22–32)
Calcium: 9 mg/dL (ref 8.9–10.3)
Chloride: 96 mmol/L — ABNORMAL LOW (ref 98–111)
Creatinine, Ser: 1.76 mg/dL — ABNORMAL HIGH (ref 0.61–1.24)
GFR, Estimated: 39 mL/min — ABNORMAL LOW (ref >60)
Glucose, Bld: 123 mg/dL — ABNORMAL HIGH (ref 70–99)
Potassium: 3.9 mmol/L (ref 3.5–5.1)
Sodium: 136 mmol/L (ref 135–145)
Total Bilirubin: 1.1 mg/dL (ref 0.0–1.2)
Total Protein: 7.5 g/dL (ref 6.5–8.1)

## 2024-08-08 LAB — CBC WITH DIFFERENTIAL/PLATELET
Abs Immature Granulocytes: 0.06 K/uL (ref 0.00–0.07)
Basophils Absolute: 0 K/uL (ref 0.0–0.1)
Basophils Relative: 0 %
Eosinophils Absolute: 0 K/uL (ref 0.0–0.5)
Eosinophils Relative: 0 %
HCT: 35 % — ABNORMAL LOW (ref 39.0–52.0)
Hemoglobin: 11.1 g/dL — ABNORMAL LOW (ref 13.0–17.0)
Immature Granulocytes: 1 %
Lymphocytes Relative: 12 %
Lymphs Abs: 1.1 K/uL (ref 0.7–4.0)
MCH: 26.9 pg (ref 26.0–34.0)
MCHC: 31.7 g/dL (ref 30.0–36.0)
MCV: 85 fL (ref 80.0–100.0)
Monocytes Absolute: 0.5 K/uL (ref 0.1–1.0)
Monocytes Relative: 5 %
Neutro Abs: 7.7 K/uL (ref 1.7–7.7)
Neutrophils Relative %: 82 %
Platelets: 292 K/uL (ref 150–400)
RBC: 4.12 MIL/uL — ABNORMAL LOW (ref 4.22–5.81)
RDW: 12.6 % (ref 11.5–15.5)
WBC: 9.4 K/uL (ref 4.0–10.5)
nRBC: 0 % (ref 0.0–0.2)

## 2024-08-08 LAB — URINALYSIS, ROUTINE W REFLEX MICROSCOPIC
Bilirubin Urine: NEGATIVE
Glucose, UA: NEGATIVE mg/dL
Hgb urine dipstick: NEGATIVE
Ketones, ur: 20 mg/dL — AB
Leukocytes,Ua: NEGATIVE
Nitrite: NEGATIVE
Protein, ur: NEGATIVE mg/dL
Specific Gravity, Urine: 1.03 (ref 1.005–1.030)
pH: 5 (ref 5.0–8.0)

## 2024-08-08 LAB — LIPASE, BLOOD: Lipase: 32 U/L (ref 11–51)

## 2024-08-08 LAB — I-STAT CG4 LACTIC ACID, ED: Lactic Acid, Venous: 5.9 mmol/L (ref 0.5–1.9)

## 2024-08-08 MED ORDER — ONDANSETRON HCL 4 MG/2ML IJ SOLN: 4.0000 mg | Freq: Four times a day (QID) | INTRAMUSCULAR | Status: DC | PRN

## 2024-08-08 MED ORDER — BISACODYL 5 MG PO TBEC: 5.0000 mg | DELAYED_RELEASE_TABLET | Freq: Every day | ORAL | Status: DC | PRN

## 2024-08-08 MED ORDER — FENTANYL CITRATE (PF) 50 MCG/ML IJ SOSY
50.0000 ug | PREFILLED_SYRINGE | Freq: Once | INTRAMUSCULAR | Status: AC
  Administered 2024-08-08: 18:00:00 50 ug via INTRAVENOUS
  Filled 2024-08-08: qty 1

## 2024-08-08 MED ORDER — LIDOCAINE 5 % EX PTCH
1.0000 | MEDICATED_PATCH | CUTANEOUS | Status: DC
  Administered 2024-08-08 – 2024-08-09 (×2): 1 via TRANSDERMAL
  Filled 2024-08-08 (×3): qty 1

## 2024-08-08 MED ORDER — SODIUM CHLORIDE 0.9 % IV BOLUS
1000.0000 mL | Freq: Once | INTRAVENOUS | Status: AC
  Administered 2024-08-08: 18:00:00 1000 mL via INTRAVENOUS

## 2024-08-08 MED ORDER — HEPARIN SODIUM (PORCINE) 5000 UNIT/ML IJ SOLN
5000.0000 [IU] | Freq: Three times a day (TID) | INTRAMUSCULAR | Status: DC
  Administered 2024-08-09 – 2024-08-11 (×6): 5000 [IU] via SUBCUTANEOUS
  Filled 2024-08-08 (×6): qty 1

## 2024-08-08 MED ORDER — METHOCARBAMOL 500 MG PO TABS
500.0000 mg | ORAL_TABLET | Freq: Three times a day (TID) | ORAL | Status: DC
  Administered 2024-08-08 – 2024-08-11 (×8): 500 mg via ORAL
  Filled 2024-08-08 (×8): qty 1

## 2024-08-08 MED ORDER — SENNOSIDES-DOCUSATE SODIUM 8.6-50 MG PO TABS: 1.0000 | ORAL_TABLET | Freq: Every evening | ORAL | Status: DC | PRN

## 2024-08-08 MED ORDER — MINERAL OIL RE ENEM
1.0000 | ENEMA | Freq: Once | RECTAL | Status: AC
  Administered 2024-08-08: 21:00:00 1 via RECTAL
  Filled 2024-08-08: qty 1

## 2024-08-08 MED ORDER — MORPHINE SULFATE (PF) 2 MG/ML IV SOLN
2.0000 mg | Freq: Once | INTRAVENOUS | Status: AC
  Administered 2024-08-08: 16:00:00 2 mg via INTRAVENOUS
  Filled 2024-08-08: qty 1

## 2024-08-08 MED ORDER — POLYETHYLENE GLYCOL 3350 17 G PO PACK
17.0000 g | PACK | Freq: Every day | ORAL | Status: DC
  Administered 2024-08-09 – 2024-08-11 (×3): 17 g via ORAL
  Filled 2024-08-08 (×3): qty 1

## 2024-08-08 MED ORDER — SENNOSIDES-DOCUSATE SODIUM 8.6-50 MG PO TABS
2.0000 | ORAL_TABLET | Freq: Every day | ORAL | Status: DC
  Administered 2024-08-08 – 2024-08-10 (×3): 2 via ORAL
  Filled 2024-08-08 (×3): qty 2

## 2024-08-08 MED ORDER — SODIUM CHLORIDE 0.9 % IV SOLN: INTRAVENOUS | Status: AC

## 2024-08-08 MED ORDER — SODIUM CHLORIDE 0.9 % IV BOLUS
1000.0000 mL | Freq: Once | INTRAVENOUS | Status: AC
  Administered 2024-08-08: 22:00:00 1000 mL via INTRAVENOUS

## 2024-08-08 MED ORDER — OXYCODONE HCL 5 MG PO TABS: 5.0000 mg | ORAL_TABLET | Freq: Four times a day (QID) | ORAL | Status: DC | PRN

## 2024-08-08 MED ORDER — ACETAMINOPHEN 650 MG RE SUPP: 650.0000 mg | Freq: Four times a day (QID) | RECTAL | Status: DC | PRN

## 2024-08-08 MED ORDER — MORPHINE SULFATE ER 15 MG PO TBCR
15.0000 mg | EXTENDED_RELEASE_TABLET | Freq: Two times a day (BID) | ORAL | Status: DC
  Administered 2024-08-08 – 2024-08-11 (×6): 15 mg via ORAL
  Filled 2024-08-08 (×6): qty 1

## 2024-08-08 MED ORDER — HYDROMORPHONE HCL 1 MG/ML IJ SOLN
1.0000 mg | Freq: Once | INTRAMUSCULAR | Status: AC
  Administered 2024-08-08: 19:00:00 1 mg via INTRAVENOUS
  Filled 2024-08-08: qty 1

## 2024-08-08 MED ORDER — ONDANSETRON HCL 4 MG/2ML IJ SOLN
4.0000 mg | Freq: Once | INTRAMUSCULAR | Status: AC
  Administered 2024-08-08: 16:00:00 4 mg via INTRAVENOUS
  Filled 2024-08-08: qty 2

## 2024-08-08 MED ORDER — ENSURE PLUS HIGH PROTEIN PO LIQD
237.0000 mL | Freq: Two times a day (BID) | ORAL | Status: DC
  Administered 2024-08-09 – 2024-08-11 (×5): 237 mL via ORAL
  Filled 2024-08-08 (×7): qty 237

## 2024-08-08 MED ORDER — ONDANSETRON HCL 4 MG PO TABS: 4.0000 mg | ORAL_TABLET | Freq: Four times a day (QID) | ORAL | Status: DC | PRN

## 2024-08-08 MED ORDER — ACETAMINOPHEN 325 MG PO TABS: 650.0000 mg | ORAL_TABLET | Freq: Four times a day (QID) | ORAL | Status: DC | PRN

## 2024-08-08 MED ORDER — METOPROLOL SUCCINATE ER 25 MG PO TB24
12.5000 mg | ORAL_TABLET | Freq: Every day | ORAL | Status: DC
  Administered 2024-08-08 – 2024-08-10 (×3): 12.5 mg via ORAL
  Filled 2024-08-08 (×3): qty 1

## 2024-08-08 MED ORDER — IOHEXOL 350 MG/ML SOLN
75.0000 mL | Freq: Once | INTRAVENOUS | Status: AC | PRN
  Administered 2024-08-08: 19:00:00 75 mL via INTRAVENOUS

## 2024-08-08 MED ORDER — TAMSULOSIN HCL 0.4 MG PO CAPS
0.4000 mg | ORAL_CAPSULE | Freq: Every day | ORAL | Status: DC
  Administered 2024-08-08 – 2024-08-10 (×3): 0.4 mg via ORAL
  Filled 2024-08-08 (×3): qty 1

## 2024-08-08 NOTE — H&P (Signed)
 History and Physical  Jantz Main FMW:[id number] DOB: 1941-01-29 DOA: 08/08/2024  PCP: Norleen Lynwood ORN, MD   Chief Complaint: Constipation, abdominal pain  HPI: Essex Perry. is a 83 y.o. male with medical history significant for CAD, HTN, HLD, migraine, PVD, CVA, AAA, renal lesion, PTSD, vitamin D  deficiency, depression, metastatic lung cancer (diagnosed in June 2025) status post SBR and bone metastases who presented to the ED for evaluation of abdominal pain and constipation.  She reports that over the last 3 months, he has had constipation with last good bowel movement 6 months ago. He has tried some laxatives without significant relief and due to worsening lower abdominal pain today, he presented to the ED for evaluation.  He endorses poor p.o. intake due to the constipation but denies any nausea, vomiting, fevers, chills, dizziness, dysuria or bloody stools.  Reports unintentional weight loss of about 20 pounds in the last few weeks.   ED Course: Initial vitals show patient afebrile, mild tachycardia with HR 90-100s, SBP 110-120s. Initial labs significant for BUNs/creatinine 35/1.76, bicarb 20, anion gap 20, Hgb 11.1, lactic acid 5.9, UA shows mild ketonuria but no signs of infection. CXR shows emphysematous changes and scattered pulmonary fibrosis but no active disease.  CT A/P shows acute central compression fraction of the L3 vertebrae, stable infrarenal abdominal aortic aneurysm but no acute intra-abdominal pathology. CTA chest PE study negative for PE but shows severe emphysema and unchanged pulmonary nodules and metastatic T6 and T9 vertebrae lesions.  Pt received IV Dilaudid , IV morphine , IV fentanyl , IV Zofran , IV NS 1 L bolus.  Manual disimpaction was performed by EDP and patient was given mineral oil enema x 1. TRH was consulted for admission.   Review of Systems: Please see HPI for pertinent positives and negatives. A complete 10 system review of systems are otherwise  negative.  Past Medical History:  Diagnosis Date   Aneurysm    per pt,in head   Arthritis    Cancer (HCC)    Chicken pox    Coronary artery disease    Depression    Hypercholesteremia    Hypertension    Migraine    Peripheral vascular disease    PTSD (post-traumatic stress disorder) 06/24/2017   Stroke (HCC)    No residual effects   Syphilis    Tuberculosis    Vitamin D  deficiency    Past Surgical History:  Procedure Laterality Date   BUBBLE STUDY  08/15/2021   Procedure: BUBBLE STUDY;  Surgeon: Jeffrie Oneil BROCKS, MD;  Location: [facility] ENDOSCOPY;  Service: Cardiovascular;;   COLONOSCOPY     ENDOBRONCHIAL ULTRASOUND Left 02/16/2024   Procedure: ENDOBRONCHIAL ULTRASOUND (EBUS);  Surgeon: Shelah Lamar RAMAN, MD;  Location: [facility] ENDOSCOPY;  Service: Pulmonary;  Laterality: Left;   HERNIA REPAIR Right    x 2   TEE WITHOUT CARDIOVERSION N/A 08/15/2021   Procedure: TRANSESOPHAGEAL ECHOCARDIOGRAM (TEE);  Surgeon: Jeffrie Oneil BROCKS, MD;  Location: [facility] ENDOSCOPY;  Service: Cardiovascular;  Laterality: N/A;   VIDEO BRONCHOSCOPY WITH ENDOBRONCHIAL NAVIGATION Left 02/16/2024   Procedure: VIDEO BRONCHOSCOPY WITH ENDOBRONCHIAL NAVIGATION;  Surgeon: Shelah Lamar RAMAN, MD;  Location: [facility] ENDOSCOPY;  Service: Pulmonary;  Laterality: Left;   VIDEO BRONCHOSCOPY WITH ENDOBRONCHIAL NAVIGATION Left 06/13/2024   Procedure: VIDEO BRONCHOSCOPY WITH ENDOBRONCHIAL NAVIGATION;  Surgeon: Shelah Lamar RAMAN, MD;  Location: [facility] ENDOSCOPY;  Service: Pulmonary;  Laterality: Left;  Needs EBUS scope also   VIDEO BRONCHOSCOPY WITH ENDOBRONCHIAL ULTRASOUND N/A 06/13/2024   Procedure: BRONCHOSCOPY, WITH EBUS;  Surgeon: Shelah Lamar RAMAN, MD;  Location: [facility] ENDOSCOPY;  Service: Pulmonary;  Laterality: N/A;   Social History:  reports that he has quit smoking. His smoking use included cigarettes. He started smoking about 52 years ago. He has a 26.4 pack-year smoking history. He has never used smokeless tobacco. He reports that he does not  currently use alcohol after a past usage of about 2.0 - 3.0 standard drinks of alcohol per week. He reports that he does not use drugs.  Allergies[1]  Family History  Problem Relation Age of Onset   Dementia Mother    Healthy Father    Prostate cancer Brother      Prior to Admission medications  Medication Sig Start Date End Date Taking? Authorizing Provider  aspirin  EC 81 MG tablet Take 1 tablet (81 mg total) by mouth daily. Swallow whole. 05/07/22   Norleen Lynwood ORN, MD  ezetimibe  (ZETIA ) 10 MG tablet Take 1 tablet (10 mg total) by mouth daily. 09/09/23   Thukkani, Arun K, MD  feeding supplement (ENSURE PLUS HIGH PROTEIN) LIQD Take 237 mLs by mouth 2 (two) times daily between meals. 07/05/24 10/03/24  Rosario Eland I, MD  lidocaine  (LIDODERM ) 5 % Place 1 patch onto the skin daily. Remove & Discard patch within 12 hours or as directed by MD 07/04/24   Rosario Eland FERNS, MD  losartan  (COZAAR ) 100 MG tablet Take 1 tablet by mouth once daily 03/08/24   Dick, Ernest H Jr., NP  memantine  (NAMENDA ) 5 MG tablet Take 1 tablet (5 mg total) by mouth 2 (two) times daily. 07/04/24   Rosario Eland FERNS, MD  methocarbamol  (ROBAXIN ) 500 MG tablet Take 1 tablet (500 mg total) by mouth 3 (three) times daily. 07/04/24   Rosario Eland FERNS, MD  metoprolol  succinate (TOPROL  XL) 25 MG 24 hr tablet Take 0.5 tablets (12.5 mg total) by mouth at bedtime. 11/11/22   Wyn Jackee VEAR Mickey., NP  morphine  (MS CONTIN ) 15 MG 12 hr tablet Take 1 tablet (15 mg total) by mouth every 12 (twelve) hours. 07/04/24   Rosario Eland FERNS, MD  oxyCODONE  (OXY IR/ROXICODONE ) 5 MG immediate release tablet Take 1 tablet (5 mg total) by mouth every 6 (six) hours as needed for severe pain (pain score 7-10) or breakthrough pain. 07/04/24   Rosario Eland FERNS, MD  sertraline  (ZOLOFT ) 25 MG tablet Take 1 tablet (25 mg total) by mouth daily. 06/03/21   Norleen Lynwood ORN, MD  tamsulosin  (FLOMAX ) 0.4 MG CAPS capsule Take 0.4 mg by mouth at bedtime.  09/15/22   [provider]    Physical Exam: BP 126/74 (BP Location: Right Arm)   Pulse 88   Temp 98.1 F (36.7 C) (Rectal)   Resp 18   Ht 5' 9 (1.753 m)   Wt 49.9 kg   SpO2 100%   BMI 16.24 kg/m  General: Pleasant, chronically ill, cachectic and frail elderly man laying in bed. No acute distress. HEENT: [place]/AT. Anicteric sclera. Dry mucous membrane. CV: Mild tachycardia. Regular rhythm. No murmurs, rubs, or gallops. No LE edema Pulmonary: Lungs CTAB. Normal effort. No wheezing or rales. Abdominal: Soft, NT/ND. Loss of abdominal muscle and fat. Normal bowel sounds. Extremities: Palpable radial and DP pulses. Normal ROM. Skin: Warm and dry. No obvious rash or lesions. Neuro: A&Ox3. Generalized weakness. Moves all extremities. Normal sensation to light touch. No focal deficit. Psych: Normal mood and affect          Labs on Admission:  Basic Metabolic Panel: Recent  Labs  Lab 08/08/24 1607  NA 136  K 3.9  CL 96*  CO2 20*  GLUCOSE 123*  BUN 35*  CREATININE 1.76*  CALCIUM  9.0   Liver Function Tests: Recent Labs  Lab 08/08/24 1607  AST 33  ALT 22  ALKPHOS 74  BILITOT 1.1  PROT 7.5  ALBUMIN 3.2*   Recent Labs  Lab 08/08/24 1607  LIPASE 32   No results for input(s): AMMONIA in the last 168 hours. CBC: Recent Labs  Lab 08/08/24 1607  WBC 9.4  NEUTROABS 7.7  HGB 11.1*  HCT 35.0*  MCV 85.0  PLT 292   Cardiac Enzymes: No results for input(s): CKTOTAL, CKMB, CKMBINDEX, TROPONINI in the last 168 hours. BNP (last 3 results) No results for input(s): BNP in the last 8760 hours.  ProBNP (last 3 results) No results for input(s): PROBNP in the last 8760 hours.  CBG: No results for input(s): GLUCAP in the last 168 hours.  Radiological Exams on Admission: CT Angio Chest PE W and/or Wo Contrast Result Date: 08/08/2024 EXAM: CTA of the Chest with contrast for PE 08/08/2024 06:48:22 PM TECHNIQUE: CTA of the chest was performed after the  administration of intravenous contrast. Multiplanar reformatted images are provided for review. MIP images are provided for review. Automated exposure control, iterative reconstruction, and/or weight based adjustment of the mA/kV was utilized to reduce the radiation dose to as low as reasonably achievable. COMPARISON: Chest radiograph 08/08/2024 and CT chest 07/01/2024. CLINICAL HISTORY: Lung cancer, dyspnea. FINDINGS: PULMONARY ARTERIES: Good opacification of the central and segmental pulmonary arteries. No focal filling defects. No evidence of significant pulmonary embolus. Main pulmonary artery is normal in caliber. MEDIASTINUM: Normal heart size. No pericardial effusions. Normal caliber thoracic aorta. No aortic dissection. Calcification of the aorta and coronary arteries. Esophagus is decompressed. LYMPH NODES: Mediastinal lymphadenopathy is again demonstrated with pretracheal nodes measuring up to 2.7 cm in diameter. Left hilar lymph nodes measure up to 1.5 cm diameter. Subcarinal lymph nodes measure up to 2.4 cm diameter. Similar appearance to prior study. LUNGS AND PLEURA: Severe emphysematous changes in the lungs with scattered fibrosis. Scattered pulmonary nodules, largest in the left lung base, series 10 image 116, measuring 8 mm diameter. Similar appearance to prior study. No pleural effusion or pneumothorax. UPPER ABDOMEN: See additional report of CT abdomen and pelvis obtained today. SOFT TISSUES AND BONES: Metastatic lesions in the T6 and T9 vertebrae are unchanged. Old rib fractures. No acute soft tissue abnormality. IMPRESSION: 1. No evidence of pulmonary embolism. 2. Severe emphysema with scattered fibrosis. 3. Mediastinal and left hilar lymphadenopathy, corresponding to known metastases, unchanged. 4. Scattered pulmonary nodules, largest 8 mm at the left lung base, possibly metastatic. 5. Metastatic lesions in the T6 and T9 vertebrae, unchanged. Electronically signed by: Elsie Gravely MD  08/08/2024 07:22 PM EST RP Workstation: [id number]   CT ABDOMEN PELVIS W CONTRAST Result Date: 08/08/2024 EXAM: CT ABDOMEN AND PELVIS WITH CONTRAST 08/08/2024 06:48:22 PM TECHNIQUE: CT of the abdomen and pelvis was performed with the administration of intravenous contrast. Multiplanar reformatted images are provided for review. Automated exposure control, iterative reconstruction, and/or weight-based adjustment of the mA/kV was utilized to reduce the radiation dose to as low as reasonably achievable. COMPARISON: Comparison with 07/01/2024. CLINICAL HISTORY: Abdominal pain, acute, nonlocalized. FINDINGS: LOWER CHEST: Emphysematous changes and scarring in the lung bases. LIVER: Numerous tiny subcentimeter low attenuation lesions throughout the liver are similar to the prior study. This suggests biliary hematomas indicating a benign etiology. GALLBLADDER AND BILE  DUCTS: Gallbladder is unremarkable. No biliary ductal dilatation. SPLEEN: No acute abnormality. PANCREAS: No acute abnormality. ADRENAL GLANDS: No acute abnormality. KIDNEYS, URETERS AND BLADDER: Atrophic right kidney with delayed nephrogram suggesting chronic renovascular disease. Bilateral renal cysts measuring up to 3 cm diameter. No change. No imaging follow-up is indicated. No stones in the kidneys or ureters. No hydronephrosis. No perinephric or periureteral stranding. Nodular enlargement of the prostate gland causing an effusion on the bladder base. GI AND BOWEL: Stool-filled colon without abnormal distention or wall thickening. The stomach and small bowel are decompressed. The appendix is not identified. There is no bowel obstruction. PERITONEUM AND RETROPERITONEUM: No ascites. No free air. VASCULATURE: Infrarenal abdominal aortic aneurysm measuring 3.2 x 4.2 cm in diameter and containing mural thrombus and mural calcification. Measurements are similar to the prior study. Partially thrombosed right iliac artery aneurysm measuring 1.6 cm diameter,  unchanged. LYMPH NODES: No lymphadenopathy. REPRODUCTIVE ORGANS: Nodular enlargement of the prostate gland causing an effusion on the bladder base. BONES AND SOFT TISSUES: Acute central compression fracture of the L3 vertebra, new since prior study. No retropulsion of fracture fragments. Posterior elements are not involved. Old compression deformity of L4 is unchanged. No focal soft tissue abnormality. IMPRESSION: 1. Acute central compression fracture of the L3 vertebra, new since prior study; no retropulsion and posterior elements spared. 2. Infrarenal abdominal aortic aneurysm measuring 3.2 x 4.2 cm with mural thrombus and mural calcification, similar to prior; recommend ultrasound follow-up in 3 years. 3. 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 4. Atrophic right kidney with delayed nephrogram suggesting chronic renovascular disease. 5. Bilateral renal cysts up to 3 cm, stable benign cysts with no imaging follow-up recommended. 6. Nodular enlargement of the prostate gland impressing upon the bladder base. Electronically signed by: Elsie Gravely MD 08/08/2024 07:15 PM EST RP Workstation: [id number]   DG Chest Portable 1 View Result Date: 08/08/2024 EXAM: 1 VIEW(S) XRAY OF THE CHEST 08/08/2024 06:31:00 PM COMPARISON: Comparison with 07/01/2024. CLINICAL HISTORY: SOB, lung cancer. FINDINGS: LUNGS AND PLEURA: Emphysematous changes and scattered fibrosis in the lungs. Scarring in the lung apices and bases. No airspace disease or consolidation. No pleural effusion or pneumothorax. HEART AND MEDIASTINUM: Mediastinal contours appear intact. Normal heart size and pulmonary vascularity. Calcification of the aorta. BONES AND SOFT TISSUES: Old left rib fracture deformities. No acute osseous abnormality. IMPRESSION: 1. No acute cardiopulmonary process. 2. Emphysematous changes and scattered pulmonary fibrosis, which can contribute to shortness of breath. Electronically  signed by: Elsie Gravely MD 08/08/2024 07:06 PM EST RP Workstation: [id number]   Assessment/Plan Carlin MALVA Metro [name] is a 83 y.o. male with medical history significant for CAD, HTN, HLD, migraine, PVD, CVA, AAA, BPH, renal lesion, PTSD, vitamin D  deficiency, depression, metastatic lung cancer (diagnosed in June 2025) status post SBR and bone metastases who presented to the ED for evaluation of abdominal pain and constipation and admitted for AKI.  # AKI # AGMA - Creatinine elevated to 1.76, from normal baseline, bicarb 20, AG 20 - Likely prerenal in the setting of poor p.o. intake - Start IV NS 1 L bolus followed by 125 cc/h - Trend renal function and avvoid nephrotoxic meds  # Constipation # Fecal impaction # Lower abdominal pain - Patient with history of cancer related pain on daily and as needed opioids now presenting with months of constipation and worsening lower abdominal pain - Patient found to have severe impaction, received manual disimpaction in the ED followed by mineral oil enema -  Reports significant improvement in abdominal pain after manual disimpaction - Aggressive bowel regimen with scheduled scheduled MiraLAX  and Senokot-S, as needed Dulcolax - Patient will need to be discharged on a bowel regimen  # Metastatic lung cancer # Osseous metastases - Diagnosed in June 2025, completed SBRT and currently undergoing chemotherapy at the [facility], last chemo 3 weeks ago with next chemo on 12/20 - Imaging on admission shows stable scattered pulmonary nodules, largest 8 mm at the left lung base and metastatic lesions in the T6 and T9 vertebrae - Follow-up with [facility] in the outpatient  # Cancer related pain # Chronic back pain - Continue multimodal pain control with lidocaine  patch, scheduled Tylenol , Robaxin , morphine  and as needed oxycodone  - Start bowel regiment with daily MiraLAX  and Senokot-S  # HTN - BP stable with SBP in the 110-120 - Continue Toprol  XL and hold  losartan  in the setting of AKI  # AAA - Repeat imaging shows stable infrarenal abdominal aortic aneurysm measuring 3.2 x 4.2 cm thrombus calcification - Ultrasound recommended for follow-up in 3 years - Follow-up with vascular surgery in the outpatient  # BPH - Continue Flomax    DVT prophylaxis: Heparin     Code Status: Limited: Do not attempt resuscitation (DNR) -DNR-LIMITED -Do Not Intubate/DNI   Consults called: None  Family Communication: No family at bedside  Severity of Illness: The appropriate patient status for this patient is OBSERVATION. Observation status is judged to be reasonable and necessary in order to provide the required intensity of service to ensure the patient's safety. The patient's presenting symptoms, physical exam findings, and initial radiographic and laboratory data in the context of their medical condition is felt to place them at decreased risk for further clinical deterioration. Furthermore, it is anticipated that the patient will be medically stable for discharge from the hospital within 2 midnights of admission.   Level of care: Med-Surg    Lou Claretta HERO, MD 08/08/2024, 9:37 PM [facility] Pager: [phone] Isaiah 41:10   If 7PM-7AM, please contact night-coverage [email or web address] Password [email or web address]    [1]  Allergies Allergen Reactions   Amlodipine Rash   Brinzolamide-Brimonidine Other (See Comments)    Not allergic per pt.    Pollen Extract Other (See Comments)    Sinusitis

## 2024-08-08 NOTE — ED Notes (Signed)
 Patient had a fecal impaction. I assisted Dr jerrol with rectal exam to escape impaction. Patient has also been administered a mineral enema and has since been unable to defecate since the enema.   Patient also offered heparin  and he said he would like to wait until he gets into a room

## 2024-08-08 NOTE — ED Provider Notes (Signed)
 [place] EMERGENCY DEPARTMENT AT [facility] Provider Note   CSN: [id number] Arrival date & time: 08/08/24  1441     Patient presents with: Abdominal Pain   Roberto Wallace. is a 83 y.o. male.    Abdominal Pain Associated symptoms: shortness of breath      83 year old male with medical history significant for lung cancer, abdominal aortic aneurysm, HTN, BPH, renal lesion, PTSD, vitamin D  deficiency, depression, metastatic lung cancer with bone metastases who presented to the emergency department with abdominal pain and constipation.  The patient states that he has not had a bowel movement in months.  He states that he strains to have a bowel movement and only squirts small amounts of liquid out.  He endorses rectal pain and tenesmus without inability to actually push stool out.  Denies any falls or trauma, endorses diffuse pain throughout his lower abdomen.  He denies any nausea or vomiting.  Pain is 10 out of 10 in severity.  He also endorses shortness of breath in the setting of pain.  Denies any chest pain.  Prior to Admission medications  Medication Sig Start Date End Date Taking? Authorizing Provider  aspirin  EC 81 MG tablet Take 1 tablet (81 mg total) by mouth daily. Swallow whole. 05/07/22   Norleen Lynwood ORN, MD  ezetimibe  (ZETIA ) 10 MG tablet Take 1 tablet (10 mg total) by mouth daily. 09/09/23   Thukkani, Arun K, MD  feeding supplement (ENSURE PLUS HIGH PROTEIN) LIQD Take 237 mLs by mouth 2 (two) times daily between meals. 07/05/24 10/03/24  Rosario Eland I, MD  lidocaine  (LIDODERM ) 5 % Place 1 patch onto the skin daily. Remove & Discard patch within 12 hours or as directed by MD 07/04/24   Rosario Eland FERNS, MD  losartan  (COZAAR ) 100 MG tablet Take 1 tablet by mouth once daily 03/08/24   Dick, Ernest H Jr., NP  memantine  (NAMENDA ) 5 MG tablet Take 1 tablet (5 mg total) by mouth 2 (two) times daily. 07/04/24   Rosario Eland FERNS, MD  methocarbamol  (ROBAXIN )  500 MG tablet Take 1 tablet (500 mg total) by mouth 3 (three) times daily. 07/04/24   Rosario Eland FERNS, MD  metoprolol  succinate (TOPROL  XL) 25 MG 24 hr tablet Take 0.5 tablets (12.5 mg total) by mouth at bedtime. 11/11/22   Wyn Jackee VEAR Mickey., NP  morphine  (MS CONTIN ) 15 MG 12 hr tablet Take 1 tablet (15 mg total) by mouth every 12 (twelve) hours. 07/04/24   Rosario Eland FERNS, MD  oxyCODONE  (OXY IR/ROXICODONE ) 5 MG immediate release tablet Take 1 tablet (5 mg total) by mouth every 6 (six) hours as needed for severe pain (pain score 7-10) or breakthrough pain. 07/04/24   Rosario Eland FERNS, MD  sertraline  (ZOLOFT ) 25 MG tablet Take 1 tablet (25 mg total) by mouth daily. 06/03/21   Norleen Lynwood ORN, MD  tamsulosin  (FLOMAX ) 0.4 MG CAPS capsule Take 0.4 mg by mouth at bedtime. 09/15/22   [provider]    Allergies: Amlodipine, Brinzolamide-brimonidine, and Pollen extract    Review of Systems  Respiratory:  Positive for shortness of breath.   Gastrointestinal:  Positive for abdominal pain.  All other systems reviewed and are negative.   Updated Vital Signs BP 126/74 (BP Location: Right Arm)   Pulse 88   Temp 98.1 F (36.7 C) (Rectal)   Resp 18   Ht 5' 9 (1.753 m)   Wt 49.9 kg   SpO2 100%   BMI 16.24  kg/m   Physical Exam Vitals and nursing note reviewed. Exam conducted with a chaperone present.  Constitutional:      General: He is not in acute distress.    Appearance: He is well-developed.  HENT:     Head: Normocephalic and atraumatic.  Eyes:     Conjunctiva/sclera: Conjunctivae normal.  Cardiovascular:     Rate and Rhythm: Normal rate and regular rhythm.     Heart sounds: No murmur heard. Pulmonary:     Effort: Pulmonary effort is normal. No respiratory distress.     Breath sounds: Normal breath sounds.  Abdominal:     General: There is distension.     Palpations: Abdomen is soft.     Tenderness: There is generalized abdominal tenderness. There is guarding.   Genitourinary:    Comments: Extensive fecal impaction noted, see disimpaction note Musculoskeletal:        General: No swelling.     Cervical back: Neck supple.  Skin:    General: Skin is warm and dry.     Capillary Refill: Capillary refill takes less than 2 seconds.  Neurological:     Mental Status: He is alert.  Psychiatric:        Mood and Affect: Mood normal.     (all labs ordered are listed, but only abnormal results are displayed) Labs Reviewed  COMPREHENSIVE METABOLIC PANEL WITH GFR - Abnormal; Notable for the following components:      Result Value   Chloride 96 (*)    CO2 20 (*)    Glucose, Bld 123 (*)    BUN 35 (*)    Creatinine, Ser 1.76 (*)    Albumin 3.2 (*)    GFR, Estimated 39 (*)    Anion gap 20 (*)    All other components within normal limits  CBC WITH DIFFERENTIAL/PLATELET - Abnormal; Notable for the following components:   RBC 4.12 (*)    Hemoglobin 11.1 (*)    HCT 35.0 (*)    All other components within normal limits  URINALYSIS, ROUTINE W REFLEX MICROSCOPIC - Abnormal; Notable for the following components:   Ketones, ur 20 (*)    All other components within normal limits  I-STAT CG4 LACTIC ACID, ED - Abnormal; Notable for the following components:   Lactic Acid, Venous 5.9 (*)    All other components within normal limits  LIPASE, BLOOD  BASIC METABOLIC PANEL WITH GFR  CBC  I-STAT CG4 LACTIC ACID, ED    EKG: None  Radiology: CT Angio Chest PE W and/or Wo Contrast Result Date: 08/08/2024 EXAM: CTA of the Chest with contrast for PE 08/08/2024 06:48:22 PM TECHNIQUE: CTA of the chest was performed after the administration of intravenous contrast. Multiplanar reformatted images are provided for review. MIP images are provided for review. Automated exposure control, iterative reconstruction, and/or weight based adjustment of the mA/kV was utilized to reduce the radiation dose to as low as reasonably achievable. COMPARISON: Chest radiograph 08/08/2024  and CT chest 07/01/2024. CLINICAL HISTORY: Lung cancer, dyspnea. FINDINGS: PULMONARY ARTERIES: Good opacification of the central and segmental pulmonary arteries. No focal filling defects. No evidence of significant pulmonary embolus. Main pulmonary artery is normal in caliber. MEDIASTINUM: Normal heart size. No pericardial effusions. Normal caliber thoracic aorta. No aortic dissection. Calcification of the aorta and coronary arteries. Esophagus is decompressed. LYMPH NODES: Mediastinal lymphadenopathy is again demonstrated with pretracheal nodes measuring up to 2.7 cm in diameter. Left hilar lymph nodes measure up to 1.5 cm diameter. Subcarinal lymph nodes  measure up to 2.4 cm diameter. Similar appearance to prior study. LUNGS AND PLEURA: Severe emphysematous changes in the lungs with scattered fibrosis. Scattered pulmonary nodules, largest in the left lung base, series 10 image 116, measuring 8 mm diameter. Similar appearance to prior study. No pleural effusion or pneumothorax. UPPER ABDOMEN: See additional report of CT abdomen and pelvis obtained today. SOFT TISSUES AND BONES: Metastatic lesions in the T6 and T9 vertebrae are unchanged. Old rib fractures. No acute soft tissue abnormality. IMPRESSION: 1. No evidence of pulmonary embolism. 2. Severe emphysema with scattered fibrosis. 3. Mediastinal and left hilar lymphadenopathy, corresponding to known metastases, unchanged. 4. Scattered pulmonary nodules, largest 8 mm at the left lung base, possibly metastatic. 5. Metastatic lesions in the T6 and T9 vertebrae, unchanged. Electronically signed by: Elsie Gravely MD 08/08/2024 07:22 PM EST RP Workstation: [id number]   CT ABDOMEN PELVIS W CONTRAST Result Date: 08/08/2024 EXAM: CT ABDOMEN AND PELVIS WITH CONTRAST 08/08/2024 06:48:22 PM TECHNIQUE: CT of the abdomen and pelvis was performed with the administration of intravenous contrast. Multiplanar reformatted images are provided for review. Automated exposure  control, iterative reconstruction, and/or weight-based adjustment of the mA/kV was utilized to reduce the radiation dose to as low as reasonably achievable. COMPARISON: Comparison with 07/01/2024. CLINICAL HISTORY: Abdominal pain, acute, nonlocalized. FINDINGS: LOWER CHEST: Emphysematous changes and scarring in the lung bases. LIVER: Numerous tiny subcentimeter low attenuation lesions throughout the liver are similar to the prior study. This suggests biliary hematomas indicating a benign etiology. GALLBLADDER AND BILE DUCTS: Gallbladder is unremarkable. No biliary ductal dilatation. SPLEEN: No acute abnormality. PANCREAS: No acute abnormality. ADRENAL GLANDS: No acute abnormality. KIDNEYS, URETERS AND BLADDER: Atrophic right kidney with delayed nephrogram suggesting chronic renovascular disease. Bilateral renal cysts measuring up to 3 cm diameter. No change. No imaging follow-up is indicated. No stones in the kidneys or ureters. No hydronephrosis. No perinephric or periureteral stranding. Nodular enlargement of the prostate gland causing an effusion on the bladder base. GI AND BOWEL: Stool-filled colon without abnormal distention or wall thickening. The stomach and small bowel are decompressed. The appendix is not identified. There is no bowel obstruction. PERITONEUM AND RETROPERITONEUM: No ascites. No free air. VASCULATURE: Infrarenal abdominal aortic aneurysm measuring 3.2 x 4.2 cm in diameter and containing mural thrombus and mural calcification. Measurements are similar to the prior study. 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 LYMPH NODES: No lymphadenopathy. REPRODUCTIVE ORGANS: Nodular enlargement of the prostate gland causing an effusion on the bladder base. BONES AND SOFT TISSUES: Acute central compression fracture of the L3 vertebra, new since prior study. No retropulsion of fracture fragments. Posterior elements are not involved. Old compression deformity of  L4 is unchanged. No focal soft tissue abnormality. IMPRESSION: 1. Acute central compression fracture of the L3 vertebra, new since prior study; no retropulsion and posterior elements spared. 2. Infrarenal abdominal aortic aneurysm measuring 3.2 x 4.2 cm with mural thrombus and mural calcification, similar to prior; recommend ultrasound follow-up in 3 years. 3. 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 4. Atrophic right kidney with delayed nephrogram suggesting chronic renovascular disease. 5. Bilateral renal cysts up to 3 cm, stable benign cysts with no imaging follow-up recommended. 6. Nodular enlargement of the prostate gland impressing upon the bladder base. Electronically signed by: Elsie Gravely MD 08/08/2024 07:15 PM EST RP Workstation: [id number]   DG Chest Portable 1 View Result Date: 08/08/2024 EXAM: 1 VIEW(S) XRAY OF THE CHEST 08/08/2024 06:31:00 PM COMPARISON: Comparison  with 07/01/2024. CLINICAL HISTORY: SOB, lung cancer. FINDINGS: LUNGS AND PLEURA: Emphysematous changes and scattered fibrosis in the lungs. Scarring in the lung apices and bases. No airspace disease or consolidation. No pleural effusion or pneumothorax. HEART AND MEDIASTINUM: Mediastinal contours appear intact. Normal heart size and pulmonary vascularity. Calcification of the aorta. BONES AND SOFT TISSUES: Old left rib fracture deformities. No acute osseous abnormality. IMPRESSION: 1. No acute cardiopulmonary process. 2. Emphysematous changes and scattered pulmonary fibrosis, which can contribute to shortness of breath. Electronically signed by: Elsie Gravely MD 08/08/2024 07:06 PM EST RP Workstation: [id number]     .Fecal disimpaction  Date/Time: 08/08/2024 8:33 PM  Performed by: Jerrol Agent, MD Authorized by: Jerrol Agent, MD  Consent: Verbal consent obtained Risks and benefits: risks, benefits and alternatives were discussed Consent given by: patient Patient identity  confirmed: verbally with patient Time out: Immediately prior to procedure a time out was called to verify the correct patient, procedure, equipment, support staff and site/side marked as required. Local anesthesia used: no  Anesthesia: Local anesthesia used: no  Sedation: Patient sedated: no  Patient tolerance: patient tolerated the procedure well with no immediate complications Comments: Large fecal impaction subsequently disimpacted bedside with relief of pain      Medications Ordered in the ED  acetaminophen  (TYLENOL ) tablet 650 mg (has no administration in time range)    Or  acetaminophen  (TYLENOL ) suppository 650 mg (has no administration in time range)  bisacodyl  (DULCOLAX) EC tablet 5 mg (has no administration in time range)  ondansetron  (ZOFRAN ) tablet 4 mg (has no administration in time range)    Or  ondansetron  (ZOFRAN ) injection 4 mg (has no administration in time range)  heparin  injection 5,000 Units (0 Units Subcutaneous Hold 08/08/24 2208)  sodium chloride  0.9 % bolus 1,000 mL (1,000 mLs Intravenous Bolus from Bag 08/08/24 2145)    Followed by  0.9 %  sodium chloride  infusion (has no administration in time range)  senna-docusate (Senokot-S) tablet 2 tablet (2 tablets Oral Given 08/08/24 2210)  polyethylene glycol (MIRALAX  / GLYCOLAX ) packet 17 g (has no administration in time range)  morphine  (MS CONTIN ) 12 hr tablet 15 mg (has no administration in time range)  oxyCODONE  (Oxy IR/ROXICODONE ) immediate release tablet 5 mg (has no administration in time range)  metoprolol  succinate (TOPROL -XL) 24 hr tablet 12.5 mg (has no administration in time range)  tamsulosin  (FLOMAX ) capsule 0.4 mg (has no administration in time range)  feeding supplement (ENSURE PLUS HIGH PROTEIN) liquid 237 mL (has no administration in time range)  lidocaine  (LIDODERM ) 5 % 1 patch (has no administration in time range)  methocarbamol  (ROBAXIN ) tablet 500 mg (has no administration in time range)   morphine  (PF) 2 MG/ML injection 2 mg (2 mg Intravenous Given 08/08/24 1605)  ondansetron  (ZOFRAN ) injection 4 mg (4 mg Intravenous Given 08/08/24 1604)  sodium chloride  0.9 % bolus 1,000 mL (0 mLs Intravenous Stopped 08/08/24 2146)  fentaNYL  (SUBLIMAZE ) injection 50 mcg (50 mcg Intravenous Given 08/08/24 1732)  HYDROmorphone  (DILAUDID ) injection 1 mg (1 mg Intravenous Given 08/08/24 1850)  iohexol  (OMNIPAQUE ) 350 MG/ML injection 75 mL (75 mLs Intravenous Contrast Given 08/08/24 1848)  mineral oil enema 1 enema (1 enema Rectal Given 08/08/24 2059)                                    Medical Decision Making Amount and/or Complexity of Data Reviewed Radiology: ordered.  Risk OTC drugs.  Prescription drug management. Decision regarding hospitalization.    83 year old male with medical history significant for lung cancer, abdominal aortic aneurysm, HTN, BPH, renal lesion, PTSD, vitamin D  deficiency, depression, metastatic lung cancer with bone metastases who presented to the emergency department with abdominal pain and constipation.  The patient states that he has not had a bowel movement in months.  He states that he strains to have a bowel movement and only squirts small amounts of liquid out.  He endorses rectal pain and tenesmus without inability to actually push stool out.  Denies any falls or trauma, endorses diffuse pain throughout his lower abdomen.  He denies any nausea or vomiting.  Pain is 10 out of 10 in severity.  He also endorses shortness of breath in the setting of pain.  Denies any chest pain.  On arrival, the patient was afebrile, tachycardic heart rate 109, not tachypneic, hemodynamically stable, saturating on room air.  Patient with diffuse abdominal pain on exam.  Concern for fecal impaction, stercoral colitis, bowel obstruction, ileus.  Additionally considered pneumonia, pneumothorax, PE, large pleural effusion in the setting the patient's malignancy.  CXR: IMPRESSION:   1. No acute cardiopulmonary process.  2. Emphysematous changes and scattered pulmonary fibrosis, which can contribute  to shortness of breath.   CTA PE: IMPRESSION:  1. No evidence of pulmonary embolism.  2. Severe emphysema with scattered fibrosis.  3. Mediastinal and left hilar lymphadenopathy, corresponding to known  metastases, unchanged.  4. Scattered pulmonary nodules, largest 8 mm at the left lung base, possibly  metastatic.  5. Metastatic lesions in the T6 and T9 vertebrae, unchanged.   CT Abd Pel: IMPRESSION:  1. Acute central compression fracture of the L3 vertebra, new since prior  study; no retropulsion and posterior elements spared.  2. Infrarenal abdominal aortic aneurysm measuring 3.2 x 4.2 cm with mural  thrombus and mural calcification, similar to prior; recommend ultrasound  follow-up in 3 years.  3. Partially thrombosed right iliac artery aneurysm measuring 1.6 cm diameter,  unchanged.  4. Atrophic right kidney with delayed nephrogram suggesting chronic  renovascular disease.  5. Bilateral renal cysts up to 3 cm, stable benign cysts with no imaging  follow-up recommended.  6. Nodular enlargement of the prostate gland impressing upon the bladder base.    Patient without evidence of bowel obstruction.  AAA appears to be stable and similar to prior imaging.  Patient had an unchanged thrombosed right iliac artery aneurysm as well.  Labs: CMP with an AKI with a creatinine of 1.76, BUN of 35, from prior baseline of 1.19.  Lipase was normal, CBC without a leukocytosis, mild anemia present.  Initial lactic acid was elevated at 5.9, repeat pending.  Urinalysis negative for UTI.  Given patient finding of fecal impaction, disimpaction was performed bedside with a large amount of stool removed.  Given patient lactic acidosis, concern for developing stercoral colitis.  Patient found to have an AKI and laboratory evaluation.  Given the patient's AKI and fecal impaction with  need for further bowel regimen likely in the setting of home opiate pain medication, hospitalist medicine was consulted for admission, Dr. Lou accepting.     Final diagnoses:  AKI (acute kidney injury)  Generalized abdominal pain  Fecal impaction (HCC)  Closed compression fracture of L3 lumbar vertebra, initial encounter (HCC)  Stercoral colitis    ED Discharge Orders     None          Jerrol Agent, MD 08/08/24 2230

## 2024-08-08 NOTE — ED Provider Triage Note (Signed)
 Emergency Medicine Provider Triage Evaluation Note  Carlin MALVA Nelwyn Chrystal. , a 83 y.o. male  was evaluated in triage.  Pt complains of abdominal pain and loose stools.  The patient reports that he has been having abdominal pain as well as recurrent loose stools over the past few hours.  Reports nausea but denies any vomiting.  Denies any fevers.  He is somewhat of a poor historian.  Review of Systems  Positive: See above Negative: Fever  Physical Exam  BP 116/82   Pulse (!) 109   Temp 97.8 F (36.6 C)   Resp 14   SpO2 92%  Gen:   Awake, no distress   Resp:  Normal effort  MSK:   Moves extremities without difficulty  Other:  Mild diffuse tenderness without guarding or rebound  Medical Decision Making  Medically screening exam initiated at 3:28 PM.  Appropriate orders placed.  [name] was informed that the remainder of the evaluation will be completed by another provider, this initial triage assessment does not replace that evaluation, and the importance of remaining in the ED until their evaluation is complete.     Ula Prentice SAUNDERS, MD 08/08/24 [phone]

## 2024-08-08 NOTE — ED Notes (Signed)
 Patient transported to CT

## 2024-08-08 NOTE — ED Triage Notes (Signed)
 Pt transported from home by [facility] c/o lower abd pain, pt reports this is chronic issue before and during BM's. Normal BM today. Denies n/v/d. Reports pain 10/10, VS WNL by EMS

## 2024-08-09 DIAGNOSIS — E7800 Pure hypercholesterolemia, unspecified: Secondary | ICD-10-CM | POA: Diagnosis present

## 2024-08-09 DIAGNOSIS — F32A Depression, unspecified: Secondary | ICD-10-CM | POA: Diagnosis present

## 2024-08-09 DIAGNOSIS — D649 Anemia, unspecified: Secondary | ICD-10-CM | POA: Diagnosis present

## 2024-08-09 DIAGNOSIS — F4310 Post-traumatic stress disorder, unspecified: Secondary | ICD-10-CM | POA: Diagnosis present

## 2024-08-09 DIAGNOSIS — I7143 Infrarenal abdominal aortic aneurysm, without rupture: Secondary | ICD-10-CM | POA: Diagnosis present

## 2024-08-09 DIAGNOSIS — I723 Aneurysm of iliac artery: Secondary | ICD-10-CM | POA: Diagnosis present

## 2024-08-09 DIAGNOSIS — J8410 Pulmonary fibrosis, unspecified: Secondary | ICD-10-CM | POA: Diagnosis present

## 2024-08-09 DIAGNOSIS — Z66 Do not resuscitate: Secondary | ICD-10-CM | POA: Diagnosis present

## 2024-08-09 DIAGNOSIS — E872 Acidosis, unspecified: Secondary | ICD-10-CM | POA: Diagnosis present

## 2024-08-09 DIAGNOSIS — C7951 Secondary malignant neoplasm of bone: Secondary | ICD-10-CM | POA: Diagnosis present

## 2024-08-09 DIAGNOSIS — J439 Emphysema, unspecified: Secondary | ICD-10-CM | POA: Diagnosis present

## 2024-08-09 DIAGNOSIS — M4856XA Collapsed vertebra, not elsewhere classified, lumbar region, initial encounter for fracture: Secondary | ICD-10-CM | POA: Diagnosis present

## 2024-08-09 DIAGNOSIS — E876 Hypokalemia: Secondary | ICD-10-CM | POA: Diagnosis present

## 2024-08-09 DIAGNOSIS — I2510 Atherosclerotic heart disease of native coronary artery without angina pectoris: Secondary | ICD-10-CM | POA: Diagnosis present

## 2024-08-09 DIAGNOSIS — Z681 Body mass index (BMI) 19 or less, adult: Secondary | ICD-10-CM | POA: Diagnosis not present

## 2024-08-09 DIAGNOSIS — T402X5A Adverse effect of other opioids, initial encounter: Secondary | ICD-10-CM | POA: Diagnosis present

## 2024-08-09 DIAGNOSIS — C3412 Malignant neoplasm of upper lobe, left bronchus or lung: Secondary | ICD-10-CM | POA: Diagnosis present

## 2024-08-09 DIAGNOSIS — R634 Abnormal weight loss: Secondary | ICD-10-CM | POA: Diagnosis present

## 2024-08-09 DIAGNOSIS — G893 Neoplasm related pain (acute) (chronic): Secondary | ICD-10-CM | POA: Diagnosis present

## 2024-08-09 DIAGNOSIS — N179 Acute kidney failure, unspecified: Secondary | ICD-10-CM | POA: Diagnosis present

## 2024-08-09 DIAGNOSIS — I10 Essential (primary) hypertension: Secondary | ICD-10-CM | POA: Diagnosis present

## 2024-08-09 DIAGNOSIS — K5641 Fecal impaction: Secondary | ICD-10-CM | POA: Diagnosis present

## 2024-08-09 DIAGNOSIS — N400 Enlarged prostate without lower urinary tract symptoms: Secondary | ICD-10-CM | POA: Diagnosis present

## 2024-08-09 LAB — BASIC METABOLIC PANEL WITH GFR
Anion gap: 10 (ref 5–15)
BUN: 26 mg/dL — ABNORMAL HIGH (ref 8–23)
CO2: 22 mmol/L (ref 22–32)
Calcium: 7.5 mg/dL — ABNORMAL LOW (ref 8.9–10.3)
Chloride: 105 mmol/L (ref 98–111)
Creatinine, Ser: 1.35 mg/dL — ABNORMAL HIGH (ref 0.61–1.24)
GFR, Estimated: 53 mL/min — ABNORMAL LOW (ref >60)
Glucose, Bld: 81 mg/dL (ref 70–99)
Potassium: 3.2 mmol/L — ABNORMAL LOW (ref 3.5–5.1)
Sodium: 137 mmol/L (ref 135–145)

## 2024-08-09 LAB — I-STAT CG4 LACTIC ACID, ED
Lactic Acid, Venous: 2.2 mmol/L (ref 0.5–1.9)
Lactic Acid, Venous: 0.6 mmol/L (ref 0.5–1.9)

## 2024-08-09 LAB — CBC
HCT: 28.8 % — ABNORMAL LOW (ref 39.0–52.0)
Hemoglobin: 9.1 g/dL — ABNORMAL LOW (ref 13.0–17.0)
MCH: 27.3 pg (ref 26.0–34.0)
MCHC: 31.6 g/dL (ref 30.0–36.0)
MCV: 86.5 fL (ref 80.0–100.0)
Platelets: 210 K/uL (ref 150–400)
RBC: 3.33 MIL/uL — ABNORMAL LOW (ref 4.22–5.81)
RDW: 13 % (ref 11.5–15.5)
WBC: 7.3 K/uL (ref 4.0–10.5)
nRBC: 0 % (ref 0.0–0.2)

## 2024-08-09 LAB — MAGNESIUM: Magnesium: 1.6 mg/dL — ABNORMAL LOW (ref 1.7–2.4)

## 2024-08-09 MED ORDER — SMOG ENEMA: 960.0000 mL | Freq: Once | RECTAL | Status: DC | PRN

## 2024-08-09 MED ORDER — POTASSIUM CHLORIDE CRYS ER 20 MEQ PO TBCR
40.0000 meq | EXTENDED_RELEASE_TABLET | Freq: Once | ORAL | Status: AC
  Administered 2024-08-09: 10:00:00 40 meq via ORAL
  Filled 2024-08-09: qty 2

## 2024-08-09 MED ORDER — MAGNESIUM SULFATE 2 GM/50ML IV SOLN
2.0000 g | Freq: Once | INTRAVENOUS | Status: AC
  Administered 2024-08-09: 16:00:00 2 g via INTRAVENOUS
  Filled 2024-08-09 (×2): qty 50

## 2024-08-09 NOTE — Progress Notes (Signed)
 Home meds takent to pharmacy

## 2024-08-09 NOTE — Evaluation (Signed)
 Occupational Therapy Evaluation Patient Details Name: Roberto HOFMANN [name] MRN: [id number] DOB: 1940-12-17 Today's Date: 08/09/2024   History of Present Illness   83 y.o. male with medical history significant for CAD, HTN, HLD, migraine, PVD, CVA, AAA, BPH, renal lesion, PTSD, vitamin D  deficiency, depression, metastatic lung cancer (diagnosed in June 2025) status post SBR and bone metastases who presented to the ED for evaluation of abdominal pain and constipation and admitted for AKI.     Clinical Impressions Pt admitted with the above concerns. Pt currently with functional limitations due to the deficits listed below (see OT Problem List). Pt reports that he lives alone in an apartment and is independent with all ADL tasks. Pt utilizes a SPC or Rollator in the home for mobility. Pt would not state why he was considering a SNF placement. Overall, pt was provided with SBA for safety/IV pole management. Ideally pt would benefit from assisted living to decrease his workload at home. Right now, pt appears to be functioning too well for me to recommend SNF although would benefit from home health OT services at discharge. Pt will benefit from acute skilled OT to increase their safety and independence with ADL and functional mobility for ADL to facilitate discharge.       If plan is discharge home, recommend the following:   Assistance with cooking/housework     Functional Status Assessment   Patient has had a recent decline in their functional status and demonstrates the ability to make significant improvements in function in a reasonable and predictable amount of time.     Equipment Recommendations   None recommended by OT      Precautions/Restrictions   Precautions Precautions: None Recall of Precautions/Restrictions: Intact Restrictions Weight Bearing Restrictions Per Provider Order: No     Mobility Bed Mobility Overal bed mobility: Modified Independent      Transfers Overall transfer level: Needs assistance Equipment used: Rolling walker (2 wheels) Transfers: Sit to/from Stand, Bed to chair/wheelchair/BSC Sit to Stand: Supervision     Step pivot transfers: Supervision     General transfer comment: Supervision provided for IV pole management.      Balance Overall balance assessment: Mild deficits observed, not formally tested        ADL either performed or assessed with clinical judgement   ADL       Grooming: Wash/dry hands;Independent;Standing   Upper Body Bathing: Supervision/ safety;Sitting   Lower Body Bathing: Supervison/ safety;Sitting/lateral leans   Upper Body Dressing : Supervision/safety;Sitting   Lower Body Dressing: Supervision/safety;Sitting/lateral leans   Toilet Transfer: Supervision/safety;Rolling walker (2 wheels);Regular Toilet;Ambulation;Grab bars   Toileting- Clothing Manipulation and Hygiene: Modified independent;Sitting/lateral lean               Vision Baseline Vision/History: 1 Wears glasses Ability to See in Adequate Light: 0 Adequate Patient Visual Report: No change from baseline Vision Assessment?: Wears glasses for reading     Perception Perception: Not tested       Praxis Praxis: Not tested       Pertinent Vitals/Pain Pain Assessment Pain Assessment: No/denies pain     Extremity/Trunk Assessment Upper Extremity Assessment Upper Extremity Assessment: Right hand dominant;Overall [facility] for tasks assessed   Lower Extremity Assessment Lower Extremity Assessment: Defer to PT evaluation   Cervical / Trunk Assessment Cervical / Trunk Assessment: Normal   Communication Communication Communication: No apparent difficulties   Cognition Arousal: Alert Behavior During Therapy: Flat affect, Anxious Cognition: No family/caregiver present to determine baseline    OT -  Cognition Comments: Not oriented to time of day. Thought it was morning at 4:30PM. There were some general  questions I asked that he did not provide an answer to unless prompted.    Following commands: Intact       Cueing  General Comments   Cueing Techniques: Verbal cues  VSS on RA           Home Living Family/patient expects to be discharged to:: Private residence Living Arrangements: Alone   Type of Home: Apartment Home Access: Level entry     Home Layout: One level     Bathroom Shower/Tub: Chief Strategy Officer: Standard     Home Equipment: Cane - single point;Shower seat;BSC/3in1;Electric scooter;Grab bars - toilet;Grab bars - tub/shower;Rollator (4 wheels)   Additional Comments: DME list taken from previous evaluation as pt only verbalized that he has a Rollator and a cane.      Prior Functioning/Environment Prior Level of Function : Independent/Modified Independent;Driving      Mobility Comments: Uses rollator or SPC in the home and scooter outside the home. Denies falls ADLs Comments: Manages medications. Schedules transportation for [facility] appointments in [place]. States that he does his grocery shopping.    OT Problem List: Decreased cognition;Impaired balance (sitting and/or standing);Decreased strength   OT Treatment/Interventions: Self-care/ADL training;Therapeutic exercise;Therapeutic activities;DME and/or AE instruction;Patient/family education;Balance training      OT Goals(Current goals can be found in the care plan section)   Acute Rehab OT Goals Patient Stated Goal: to go home OT Goal Formulation: With patient Time For Goal Achievement: 08/23/24 Potential to Achieve Goals: Good   OT Frequency:  Min 2X/week       AM-PAC OT 6 Clicks Daily Activity     Outcome Measure Help from another person eating meals?: None Help from another person taking care of personal grooming?: None Help from another person toileting, which includes using toliet, bedpan, or urinal?: None Help from another person bathing (including washing,  rinsing, drying)?: None Help from another person to put on and taking off regular upper body clothing?: None Help from another person to put on and taking off regular lower body clothing?: None 6 Click Score: 24   End of Session Equipment Utilized During Treatment: Rolling walker (2 wheels) Nurse Communication: Mobility status  Activity Tolerance: Patient tolerated treatment well Patient left: in bed;with call bell/phone within reach  OT Visit Diagnosis: Muscle weakness (generalized) (M62.81)                Time: 8384-8356 OT Time Calculation (min): 28 min Charges:  OT General Charges $OT Visit: 1 Visit OT Evaluation $OT Eval Low Complexity: 1 Low OT Treatments $Self Care/Home Management : 8-22 mins  Leita Howell, OTR/L,CBIS  Supplemental OT - [facility] and [facility] Secure Chat Preferred    Hamna Asa, Leita BIRCH 08/09/2024, 5:18 PM

## 2024-08-09 NOTE — Progress Notes (Signed)
 PROGRESS NOTE    Roberto Wallace  FMW:[id number] DOB: 19-Jul-1941 DOA: 08/08/2024 PCP: Norleen Lynwood ORN, MD    Brief Narrative:  Roberto Parlato. is a 83 y.o. male with medical history significant for CAD, HTN, HLD, migraine, PVD, CVA, AAA, BPH, renal lesion, PTSD, vitamin D  deficiency, depression, metastatic lung cancer (diagnosed in June 2025) status post SBR and bone metastases who presented to the ED for evaluation of abdominal pain and constipation and admitted for AKI.    Assessment and Plan: AKI - Creatinine elevated to 1.76, from normal baseline, bicarb 20, AG 20 - Likely prerenal in the setting of poor p.o. intake - Start IV NS 1 L bolus followed by 125 cc/h - Trend renal function and avoid nephrotoxic meds   Hypokalemia -replete  Constipation/Fecal impaction -Lower abdominal pain - Patient with history of cancer related pain on daily and as needed opioids now presenting with months of constipation and worsening lower abdominal pain - Patient found to have severe impaction, received manual disimpaction in the ED followed by mineral oil enema per Dr. Lou - Reports significant improvement in abdominal pain after manual disimpaction - Aggressive bowel regimen with scheduled scheduled MiraLAX  and Senokot-S, as needed Dulcolax - Patient will need to be discharged on a bowel regimen   Metastatic lung cancer/Osseous metastases - Diagnosed in June 2025, completed SBRT and currently undergoing chemotherapy at the [facility], last chemo 3 weeks ago with next chemo on 12/20 - Imaging on admission shows stable scattered pulmonary nodules, largest 8 mm at the left lung base and metastatic lesions in the T6 and T9 vertebrae - [facility] in [facility]   Cancer related pain/ Chronic back pain - Continue multimodal pain control with lidocaine  patch, scheduled Tylenol , Robaxin , morphine  and as needed oxycodone  - Start bowel regimen with daily MiraLAX  and  Senokot-S   HTN -holding home meds due to hypotension   AAA - Repeat imaging shows stable infrarenal abdominal aortic aneurysm measuring 3.2 x 4.2 cm thrombus calcification - Ultrasound recommended for follow-up in 3 years - Follow-up with vascular surgery in the outpatient   BPH - Continue Flomax    PT eval, lives by self   DVT prophylaxis: heparin  injection 5,000 Units Start: 08/08/24 2200    Code Status: Limited: Do not attempt resuscitation (DNR) -DNR-LIMITED -Do Not Intubate/DNI  Family Communication:   Disposition Plan:  Level of care: Med-Surg Status is: Observation     Consultants:  none   Subjective: No SOB, no CP-- eating fine Has been struggling at home for the last   Objective: Vitals:   08/09/24 0219 08/09/24 0522 08/09/24 0618 08/09/24 0851  BP:  98/78  (!) 88/61  Pulse:  (!) 49  85  Resp:  14  20  Temp: 98.6 F (37 C)  99.2 F (37.3 C) 98.4 F (36.9 C)  TempSrc: Oral  Oral Oral  SpO2:  100%  94%  Weight:      Height:       No intake or output data in the 24 hours ending 08/09/24 1038 Filed Weights   08/08/24 1734  Weight: 49.9 kg    Examination:   General: Appearance:    Thin male in no acute distress     Lungs:     respirations unlabored  Heart:    Normal heart rate. Normal rhythm. No murmurs, rubs, or gallops.    MS:   All extremities are intact.    Neurologic:   Awake, alert  Data Reviewed: I have personally reviewed following labs and imaging studies  CBC: Recent Labs  Lab 08/08/24 1607 08/09/24 0500  WBC 9.4 7.3  NEUTROABS 7.7  --   HGB 11.1* 9.1*  HCT 35.0* 28.8*  MCV 85.0 86.5  PLT 292 210   Basic Metabolic Panel: Recent Labs  Lab 08/08/24 1607 08/09/24 0500  NA 136 137  K 3.9 3.2*  CL 96* 105  CO2 20* 22  GLUCOSE 123* 81  BUN 35* 26*  CREATININE 1.76* 1.35*  CALCIUM  9.0 7.5*   GFR: Estimated Creatinine Clearance: 30.8 mL/min (A) (by C-G formula based on SCr of 1.35 mg/dL (H)). Liver  Function Tests: Recent Labs  Lab 08/08/24 1607  AST 33  ALT 22  ALKPHOS 74  BILITOT 1.1  PROT 7.5  ALBUMIN 3.2*   Recent Labs  Lab 08/08/24 1607  LIPASE 32   No results for input(s): AMMONIA in the last 168 hours. Coagulation Profile: No results for input(s): INR, PROTIME in the last 168 hours. Cardiac Enzymes: No results for input(s): CKTOTAL, CKMB, CKMBINDEX, TROPONINI in the last 168 hours. BNP (last 3 results) No results for input(s): PROBNP in the last 8760 hours. HbA1C: No results for input(s): HGBA1C in the last 72 hours. CBG: No results for input(s): GLUCAP in the last 168 hours. Lipid Profile: No results for input(s): CHOL, HDL, LDLCALC, TRIG, CHOLHDL, LDLDIRECT in the last 72 hours. Thyroid  Function Tests: No results for input(s): TSH, T4TOTAL, FREET4, T3FREE, THYROIDAB in the last 72 hours. Anemia Panel: No results for input(s): VITAMINB12, FOLATE, FERRITIN, TIBC, IRON, RETICCTPCT in the last 72 hours. Sepsis Labs: Recent Labs  Lab 08/08/24 1629 08/09/24 0203 08/09/24 0456  LATICACIDVEN 5.9* 2.2* 0.6    No results found for this or any previous visit (from the past 240 hours).       Radiology Studies: CT Angio Chest PE W and/or Wo Contrast Result Date: 08/08/2024 EXAM: CTA of the Chest with contrast for PE 08/08/2024 06:48:22 PM TECHNIQUE: CTA of the chest was performed after the administration of intravenous contrast. Multiplanar reformatted images are provided for review. MIP images are provided for review. Automated exposure control, iterative reconstruction, and/or weight based adjustment of the mA/kV was utilized to reduce the radiation dose to as low as reasonably achievable. COMPARISON: Chest radiograph 08/08/2024 and CT chest 07/01/2024. CLINICAL HISTORY: Lung cancer, dyspnea. FINDINGS: PULMONARY ARTERIES: Good opacification of the central and segmental pulmonary arteries. No focal filling  defects. No evidence of significant pulmonary embolus. Main pulmonary artery is normal in caliber. MEDIASTINUM: Normal heart size. No pericardial effusions. Normal caliber thoracic aorta. No aortic dissection. Calcification of the aorta and coronary arteries. Esophagus is decompressed. LYMPH NODES: Mediastinal lymphadenopathy is again demonstrated with pretracheal nodes measuring up to 2.7 cm in diameter. Left hilar lymph nodes measure up to 1.5 cm diameter. Subcarinal lymph nodes measure up to 2.4 cm diameter. Similar appearance to prior study. LUNGS AND PLEURA: Severe emphysematous changes in the lungs with scattered fibrosis. Scattered pulmonary nodules, largest in the left lung base, series 10 image 116, measuring 8 mm diameter. Similar appearance to prior study. No pleural effusion or pneumothorax. UPPER ABDOMEN: See additional report of CT abdomen and pelvis obtained today. SOFT TISSUES AND BONES: Metastatic lesions in the T6 and T9 vertebrae are unchanged. Old rib fractures. No acute soft tissue abnormality. IMPRESSION: 1. No evidence of pulmonary embolism. 2. Severe emphysema with scattered fibrosis. 3. Mediastinal and left hilar lymphadenopathy, corresponding to known metastases, unchanged. 4. Scattered  pulmonary nodules, largest 8 mm at the left lung base, possibly metastatic. 5. Metastatic lesions in the T6 and T9 vertebrae, unchanged. Electronically signed by: Elsie Gravely MD 08/08/2024 07:22 PM EST RP Workstation: [id number]   CT ABDOMEN PELVIS W CONTRAST Result Date: 08/08/2024 EXAM: CT ABDOMEN AND PELVIS WITH CONTRAST 08/08/2024 06:48:22 PM TECHNIQUE: CT of the abdomen and pelvis was performed with the administration of intravenous contrast. Multiplanar reformatted images are provided for review. Automated exposure control, iterative reconstruction, and/or weight-based adjustment of the mA/kV was utilized to reduce the radiation dose to as low as reasonably achievable. COMPARISON: Comparison  with 07/01/2024. CLINICAL HISTORY: Abdominal pain, acute, nonlocalized. FINDINGS: LOWER CHEST: Emphysematous changes and scarring in the lung bases. LIVER: Numerous tiny subcentimeter low attenuation lesions throughout the liver are similar to the prior study. This suggests biliary hematomas indicating a benign etiology. GALLBLADDER AND BILE DUCTS: Gallbladder is unremarkable. No biliary ductal dilatation. SPLEEN: No acute abnormality. PANCREAS: No acute abnormality. ADRENAL GLANDS: No acute abnormality. KIDNEYS, URETERS AND BLADDER: Atrophic right kidney with delayed nephrogram suggesting chronic renovascular disease. Bilateral renal cysts measuring up to 3 cm diameter. No change. No imaging follow-up is indicated. No stones in the kidneys or ureters. No hydronephrosis. No perinephric or periureteral stranding. Nodular enlargement of the prostate gland causing an effusion on the bladder base. GI AND BOWEL: Stool-filled colon without abnormal distention or wall thickening. The stomach and small bowel are decompressed. The appendix is not identified. There is no bowel obstruction. PERITONEUM AND RETROPERITONEUM: No ascites. No free air. VASCULATURE: Infrarenal abdominal aortic aneurysm measuring 3.2 x 4.2 cm in diameter and containing mural thrombus and mural calcification. Measurements are similar to the prior study. 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 LYMPH NODES: No lymphadenopathy. REPRODUCTIVE ORGANS: Nodular enlargement of the prostate gland causing an effusion on the bladder base. BONES AND SOFT TISSUES: Acute central compression fracture of the L3 vertebra, new since prior study. No retropulsion of fracture fragments. Posterior elements are not involved. Old compression deformity of L4 is unchanged. No focal soft tissue abnormality. IMPRESSION: 1. Acute central compression fracture of the L3 vertebra, new since prior study; no retropulsion and posterior  elements spared. 2. Infrarenal abdominal aortic aneurysm measuring 3.2 x 4.2 cm with mural thrombus and mural calcification, similar to prior; recommend ultrasound follow-up in 3 years. 3. 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 4. Atrophic right kidney with delayed nephrogram suggesting chronic renovascular disease. 5. Bilateral renal cysts up to 3 cm, stable benign cysts with no imaging follow-up recommended. 6. Nodular enlargement of the prostate gland impressing upon the bladder base. Electronically signed by: Elsie Gravely MD 08/08/2024 07:15 PM EST RP Workstation: [id number]   DG Chest Portable 1 View Result Date: 08/08/2024 EXAM: 1 VIEW(S) XRAY OF THE CHEST 08/08/2024 06:31:00 PM COMPARISON: Comparison with 07/01/2024. CLINICAL HISTORY: SOB, lung cancer. FINDINGS: LUNGS AND PLEURA: Emphysematous changes and scattered fibrosis in the lungs. Scarring in the lung apices and bases. No airspace disease or consolidation. No pleural effusion or pneumothorax. HEART AND MEDIASTINUM: Mediastinal contours appear intact. Normal heart size and pulmonary vascularity. Calcification of the aorta. BONES AND SOFT TISSUES: Old left rib fracture deformities. No acute osseous abnormality. IMPRESSION: 1. No acute cardiopulmonary process. 2. Emphysematous changes and scattered pulmonary fibrosis, which can contribute to shortness of breath. Electronically signed by: Elsie Gravely MD 08/08/2024 07:06 PM EST RP Workstation: [id number]        Scheduled Meds:  feeding  supplement  237 mL Oral BID BM   heparin   5,000 Units Subcutaneous Q8H   lidocaine   1 patch Transdermal Q24H   methocarbamol   500 mg Oral TID   metoprolol  succinate  12.5 mg Oral QHS   morphine   15 mg Oral Q12H   polyethylene glycol  17 g Oral Daily   senna-docusate  2 tablet Oral QHS   tamsulosin   0.4 mg Oral QHS   Continuous Infusions:  sodium chloride  125 mL/hr at 08/09/24 0942     LOS: 0 days     Time spent: 45 minutes spent on chart review, discussion with nursing staff, consultants, updating family and interview/physical exam; more than 50% of that time was spent in counseling and/or coordination of care.    Roberto RAYMOND Bowl, DO [facility] Available via Epic secure chat 7am-7pm After these hours, please refer to coverage provider listed on [email or web address] 08/09/2024, 10:38 AM

## 2024-08-09 NOTE — TOC CM/SW Note (Signed)
 Transition of Care (TOC) CM/SW Note    CSW acknowledging consult for placement. Awaiting PT evaluation for patient's needs. CSW to follow.  Almarie Goodie, [place] Clinical Social Worker [phone]

## 2024-08-10 DIAGNOSIS — N179 Acute kidney failure, unspecified: Secondary | ICD-10-CM | POA: Diagnosis not present

## 2024-08-10 LAB — BASIC METABOLIC PANEL WITH GFR
Anion gap: 5 (ref 5–15)
BUN: 20 mg/dL (ref 8–23)
CO2: 26 mmol/L (ref 22–32)
Calcium: 7.8 mg/dL — ABNORMAL LOW (ref 8.9–10.3)
Chloride: 105 mmol/L (ref 98–111)
Creatinine, Ser: 1.03 mg/dL (ref 0.61–1.24)
GFR, Estimated: >60 mL/min (ref >60)
Glucose, Bld: 97 mg/dL (ref 70–99)
Potassium: 4.1 mmol/L (ref 3.5–5.1)
Sodium: 136 mmol/L (ref 135–145)

## 2024-08-10 LAB — CBC
HCT: 28.7 % — ABNORMAL LOW (ref 39.0–52.0)
Hemoglobin: 9.1 g/dL — ABNORMAL LOW (ref 13.0–17.0)
MCH: 26.8 pg (ref 26.0–34.0)
MCHC: 31.7 g/dL (ref 30.0–36.0)
MCV: 84.7 fL (ref 80.0–100.0)
Platelets: 237 K/uL (ref 150–400)
RBC: 3.39 MIL/uL — ABNORMAL LOW (ref 4.22–5.81)
RDW: 13.1 % (ref 11.5–15.5)
WBC: 7.9 K/uL (ref 4.0–10.5)
nRBC: 0 % (ref 0.0–0.2)

## 2024-08-10 MED ORDER — MAGNESIUM GLUCONATE 500 (27 MG) MG PO TABS
500.0000 mg | ORAL_TABLET | Freq: Every day | ORAL | Status: DC
  Administered 2024-08-10 – 2024-08-11 (×2): 500 mg via ORAL
  Filled 2024-08-10 (×2): qty 1

## 2024-08-10 NOTE — TOC Initial Note (Addendum)
 Transition of Care (TOC) - Initial/Assessment Note    Patient Details  Name: Roberto Wallace. MRN: [id number] Date of Birth: July 10, 1941  Transition of Care [facility]) CM/SW Contact:    Andrez JULIANNA George, RN Phone Number: 08/10/2024, 3:09 PM  Clinical Narrative:                 Carlin MALVA Metro Chrystal. is a 83 y.o. male with medical history significant for CAD, HTN, HLD, migraine, PVD, CVA, AAA, renal lesion, PTSD, vitamin D  deficiency, depression, metastatic lung cancer (diagnosed in June 2025) status post SBR and bone metastases who presented to the ED for evaluation of abdominal pain and constipation.   Pt is from home alone. He is active with [facility] for aide services 3 days a week/ 10 hours a week. CM has talked to Ms Victory with DSS and Nat with [facility] [phone] ext [phone]) to see about getting his hours increased. The current aides are through his medicaid. [facility] if [facility] can [facility] those hours.  Pt uses [employer]. He receives meals 5 days a week.  Pt uses [employer] for transportation. He will need a cab home at d/c.  Home health services to be arranged with [facility]. Information on the AVS. CM has sent request for [facility] to the [facility].  MD also in agreement with palliative care at home. CM has sent the referral to [facility] palliative [facility]. Pt without a preference.  Pt states he feels comfortable going home tomorrow with the above arrangements.   IP Care management following.    Expected Discharge Plan: Home w Home Health Services Barriers to Discharge: Continued Medical Work up   Patient Goals and CMS Choice   CMS Medicare.gov Compare Post Acute Care list provided to:: Patient Choice offered to / list presented to : Patient      Expected Discharge Plan and Services   Discharge Planning Services: CM Consult Post Acute Care Choice: Home Health Living arrangements for the past 2 months: Apartment                           HH Arranged:  RN, PT, OT, Nurse's Aide, Social Work [facility] Agency: [facility] Date [facility] Agency Contacted: 08/10/24   Representative spoke with at [facility] Agency: cory  Prior Living Arrangements/Services Living arrangements for the past 2 months: Apartment Lives with:: Self Patient language and need for interpreter reviewed:: Yes Do you feel safe going back to the place where you live?: Yes        Care giver support system in place?: No (comment) Current home services: DME (walker) Criminal Activity/Legal Involvement Pertinent to Current Situation/Hospitalization: No - Comment as needed  Activities of Daily Living   ADL Screening (condition at time of admission) Independently performs ADLs?: No Does the patient have a NEW difficulty with bathing/dressing/toileting/self-feeding that is expected to last >3 days?: Yes (Initiates electronic notice to provider for possible OT consult) Does the patient have a NEW difficulty with getting in/out of bed, walking, or climbing stairs that is expected to last >3 days?: Yes (Initiates electronic notice to provider for possible PT consult) Does the patient have a NEW difficulty with communication that is expected to last >3 days?: No Is the patient deaf or have difficulty hearing?: No Does the patient have difficulty seeing, even when wearing glasses/contacts?: No Does the patient have difficulty concentrating, remembering, or making decisions?: No  Permission Sought/Granted                  Emotional Assessment Appearance:: Appears stated age Attitude/Demeanor/Rapport: Engaged Affect (typically observed): Accepting Orientation: : Oriented to Self, Oriented to Place, Oriented to  Time, Oriented to Situation   Psych Involvement: No (comment)  Admission diagnosis:  Fecal impaction (HCC) [K56.41] Generalized abdominal pain [R10.84] AKI (acute kidney injury) [N17.9] Closed compression fracture of L3 lumbar vertebra, initial encounter (HCC)  [S32.030A] Stercoral colitis [K52.89] Patient Active Problem List   Diagnosis Date Noted   AKI (acute kidney injury) 08/08/2024   Fecal impaction (HCC) 08/08/2024   Lower abdominal pain 08/08/2024   Closed compression fracture of L3 lumbar vertebra, initial encounter (HCC) 08/08/2024   Primary malignant neoplasm of lung metastatic to other site [facility]) 08/08/2024   Lung cancer metastatic to bone (HCC) 08/08/2024   Cancer-related pain 08/08/2024   History of abdominal aortic aneurysm (AAA) 08/08/2024   Lytic lesion of bone on x-ray 07/01/2024   Pulmonary nodule 05/31/2024   Mediastinal adenopathy 05/31/2024   DDD (degenerative disc disease), lumbar 03/07/2024   Right leg pain 03/07/2024   Urinary frequency 03/07/2024   Malignant neoplasm of upper lobe of left lung (HCC) 02/16/2024   Moderate protein-calorie malnutrition 01/15/2024   Chronic obstructive pulmonary disease, unspecified (HCC) 01/14/2024   Complete loss of teeth due to caries, class I 01/14/2024   Nontraumatic incomplete tear of right rotator cuff 01/14/2024   Other abnormalities of gait and mobility 01/14/2024   Lesion of skin of face 05/10/2022   Peripheral arterial occlusive disease 09/23/2021   Major depressive disorder, recurrent episode 09/23/2021   Inguinal hernia 09/23/2021   Hypertensive heart and chronic kidney disease with heart failure and stage 1 through stage 4 chronic kidney disease, or unspecified chronic kidney disease (HCC) 09/23/2021   Headache 09/23/2021   Glaucoma 09/23/2021   Cocaine abuse (HCC) 09/23/2021   Claudication 09/23/2021   Acquired deformities of toe(s), unspecified, left foot 09/23/2021   Gait disorder 09/23/2021   BPH (benign prostatic hyperplasia) 07/12/2021   NSVT (nonsustained ventricular tachycardia) (HCC) 07/12/2021   Near syncope 06/03/2021   Pedal edema 01/01/2021   Nail disorder 01/01/2021   Homelessness 12/12/2020   Microhematuria 02/01/2020   Allergic rhinitis 02/01/2020    History of colonic polyps 02/01/2020   Vitamin D  deficiency 03/12/2019   B12 deficiency 03/12/2019   Hyperglycemia 03/10/2019   Cough 03/10/2019   History of exposure to asbestos 03/10/2019   Hyperlipidemia 10/16/2017   Depression 10/16/2017   Stroke-like symptoms 10/16/2017   Mild dementia (HCC) 10/16/2017   Lateral epicondylitis, right elbow 06/26/2017   PTSD (post-traumatic stress disorder) 06/24/2017   Right wrist pain 06/09/2016   Osteoarthritis 02/05/2016   Tuberculosis 02/05/2016   Loss of weight 10/31/2015   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   Neuropathy 10/03/2015   Intermittent memory loss 10/03/2015   Decreased mobility 10/03/2015   Encounter for well adult exam with abnormal findings 04/16/2015   Medicare annual wellness visit, subsequent 04/16/2015   TOBACCO USER 06/14/2010   Anxiety and depression 11/19/2009   Essential hypertension 11/19/2009   Other specified cardiac dysrhythmias(427.89) 11/19/2009   GEN OSTEOARTHROSIS INVOLVING MULTIPLE SITES 11/19/2009   History of cardiovascular disorder 11/19/2009   Esophageal reflux 08/25/1997   Backache 08/26/1975   PCP:  Norleen Lynwood ORN, MD Pharmacy:   [id number] - [place]), [place] - [address] [place]) [place] [address] Phone:  [phone] Fax: [phone]  [facility] - [place], [place] - [address] [place] [address] Phone: [phone] Fax: [phone]     Social Drivers of Health (SDOH) Social History: SDOH Screenings   Food Insecurity: Food Insecurity Present (08/09/2024)  Housing: High Risk (08/09/2024)  Transportation Needs: Unmet Transportation Needs (08/09/2024)  Utilities: Not At Risk (08/09/2024)  Alcohol Screen: Low Risk (08/17/2023)  Depression (PHQ2-9): Low Risk (01/14/2024)  Financial Resource Strain: Low Risk (08/17/2023)   Physical Activity: Inactive (08/17/2023)  Social Connections: Socially Isolated (08/09/2024)  Stress: No Stress Concern Present (08/17/2023)  Tobacco Use: Medium Risk (08/08/2024)  Health Literacy: Adequate Health Literacy (08/17/2023)   SDOH Interventions:     Readmission Risk Interventions     No data to display

## 2024-08-10 NOTE — Progress Notes (Signed)
 PROGRESS NOTE    Roberto Wallace  FMW:[id number] DOB: 30-Apr-1941 DOA: 08/08/2024 PCP: Norleen Lynwood ORN, MD    Brief Narrative:  Roberto Wallace. is a 83 y.o. male with medical history significant for CAD, HTN, HLD, migraine, PVD, CVA, AAA, BPH, renal lesion, PTSD, vitamin D  deficiency, depression, metastatic lung cancer (diagnosed in June 2025) status post SBR and bone metastases who presented to the ED for evaluation of abdominal pain and constipation and admitted for AKI.    Assessment and Plan: AKI - resolved   Hypokalemia/hypomagenesemia -replete  Constipation/Fecal impaction -Lower abdominal pain - Patient with history of cancer related pain on daily and as needed opioids now presenting with months of constipation and worsening lower abdominal pain - Patient found to have severe impaction, received manual disimpaction in the ED followed by mineral oil enema per Dr. Lou - Reports significant improvement in abdominal pain after manual disimpaction - Aggressive bowel regimen with scheduled scheduled MiraLAX  and Senokot-S, as needed Dulcolax - Patient will need to be discharged on a bowel regimen   Metastatic lung cancer/Osseous metastases - Diagnosed in June 2025, completed SBRT and currently undergoing chemotherapy at the [facility], last chemo 3 weeks ago with next chemo on 12/20 - Imaging on admission shows stable scattered pulmonary nodules, largest 8 mm at the left lung base and metastatic lesions in the T6 and T9 vertebrae - [facility]   Cancer related pain/ Chronic back pain - Continue multimodal pain control with lidocaine  patch, scheduled Tylenol , Robaxin , morphine  and as needed oxycodone  - Start bowel regimen with daily MiraLAX  and Senokot-S   HTN -holding home meds due to hypotension   AAA - Repeat imaging shows stable infrarenal abdominal aortic aneurysm measuring 3.2 x 4.2 cm thrombus calcification - Ultrasound  recommended for follow-up in 3 years - Follow-up with vascular surgery in the outpatient   BPH - Continue Flomax    PT eval, lives by self- home health   DVT prophylaxis: heparin  injection 5,000 Units Start: 08/08/24 2200    Code Status: Limited: Do not attempt resuscitation (DNR) -DNR-LIMITED -Do Not Intubate/DNI  Family Communication:   Disposition Plan:  Level of care: Med-Surg Status is: Observation     Consultants:  none   Subjective: No SOB, no CP-- eating fine Has been struggling at home for the last   Objective: Vitals:   08/09/24 1620 08/09/24 1934 08/09/24 2229 08/10/24 0327  BP: 108/73 106/69 102/69 95/66  Pulse: 98 89 98 84  Resp: 19 18 18 18   Temp: 97.8 F (36.6 C) 98.4 F (36.9 C) 98.1 F (36.7 C) 98.7 F (37.1 C)  TempSrc: Oral Oral Oral Oral  SpO2: 100% 100% 100% 100%  Weight:      Height:        Intake/Output Summary (Last 24 hours) at 08/10/2024 1307 Last data filed at 08/09/2024 1700 Gross per 24 hour  Intake 720 ml  Output --  Net 720 ml   Filed Weights   08/08/24 1734  Weight: 49.9 kg    Examination:   General: Appearance:    Thin male in no acute distress     Lungs:     respirations unlabored  Heart:    Normal heart rate. Normal rhythm. No murmurs, rubs, or gallops.    MS:   All extremities are intact.    Neurologic:   Awake, alert       Data Reviewed: I have personally reviewed following labs and imaging  studies  CBC: Recent Labs  Lab 08/08/24 1607 08/09/24 0500 08/10/24 0457  WBC 9.4 7.3 7.9  NEUTROABS 7.7  --   --   HGB 11.1* 9.1* 9.1*  HCT 35.0* 28.8* 28.7*  MCV 85.0 86.5 84.7  PLT 292 210 237   Basic Metabolic Panel: Recent Labs  Lab 08/08/24 1607 08/09/24 0500 08/10/24 0457  NA 136 137 136  K 3.9 3.2* 4.1  CL 96* 105 105  CO2 20* 22 26  GLUCOSE 123* 81 97  BUN 35* 26* 20  CREATININE 1.76* 1.35* 1.03  CALCIUM  9.0 7.5* 7.8*  MG  --  1.6*  --    GFR: Estimated Creatinine Clearance: 40.4  mL/min (by C-G formula based on SCr of 1.03 mg/dL). Liver Function Tests: Recent Labs  Lab 08/08/24 1607  AST 33  ALT 22  ALKPHOS 74  BILITOT 1.1  PROT 7.5  ALBUMIN 3.2*   Recent Labs  Lab 08/08/24 1607  LIPASE 32   No results for input(s): AMMONIA in the last 168 hours. Coagulation Profile: No results for input(s): INR, PROTIME in the last 168 hours. Cardiac Enzymes: No results for input(s): CKTOTAL, CKMB, CKMBINDEX, TROPONINI in the last 168 hours. BNP (last 3 results) No results for input(s): PROBNP in the last 8760 hours. HbA1C: No results for input(s): HGBA1C in the last 72 hours. CBG: No results for input(s): GLUCAP in the last 168 hours. Lipid Profile: No results for input(s): CHOL, HDL, LDLCALC, TRIG, CHOLHDL, LDLDIRECT in the last 72 hours. Thyroid  Function Tests: No results for input(s): TSH, T4TOTAL, FREET4, T3FREE, THYROIDAB in the last 72 hours. Anemia Panel: No results for input(s): VITAMINB12, FOLATE, FERRITIN, TIBC, IRON, RETICCTPCT in the last 72 hours. Sepsis Labs: Recent Labs  Lab 08/08/24 1629 08/09/24 0203 08/09/24 0456  LATICACIDVEN 5.9* 2.2* 0.6    No results found for this or any previous visit (from the past 240 hours).       Radiology Studies: CT Angio Chest PE W and/or Wo Contrast Result Date: 08/08/2024 EXAM: CTA of the Chest with contrast for PE 08/08/2024 06:48:22 PM TECHNIQUE: CTA of the chest was performed after the administration of intravenous contrast. Multiplanar reformatted images are provided for review. MIP images are provided for review. Automated exposure control, iterative reconstruction, and/or weight based adjustment of the mA/kV was utilized to reduce the radiation dose to as low as reasonably achievable. COMPARISON: Chest radiograph 08/08/2024 and CT chest 07/01/2024. CLINICAL HISTORY: Lung cancer, dyspnea. FINDINGS: PULMONARY ARTERIES: Good opacification of the  central and segmental pulmonary arteries. No focal filling defects. No evidence of significant pulmonary embolus. Main pulmonary artery is normal in caliber. MEDIASTINUM: Normal heart size. No pericardial effusions. Normal caliber thoracic aorta. No aortic dissection. Calcification of the aorta and coronary arteries. Esophagus is decompressed. LYMPH NODES: Mediastinal lymphadenopathy is again demonstrated with pretracheal nodes measuring up to 2.7 cm in diameter. Left hilar lymph nodes measure up to 1.5 cm diameter. Subcarinal lymph nodes measure up to 2.4 cm diameter. Similar appearance to prior study. LUNGS AND PLEURA: Severe emphysematous changes in the lungs with scattered fibrosis. Scattered pulmonary nodules, largest in the left lung base, series 10 image 116, measuring 8 mm diameter. Similar appearance to prior study. No pleural effusion or pneumothorax. UPPER ABDOMEN: See additional report of CT abdomen and pelvis obtained today. SOFT TISSUES AND BONES: Metastatic lesions in the T6 and T9 vertebrae are unchanged. Old rib fractures. No acute soft tissue abnormality. IMPRESSION: 1. No evidence of pulmonary embolism. 2. Severe  emphysema with scattered fibrosis. 3. Mediastinal and left hilar lymphadenopathy, corresponding to known metastases, unchanged. 4. Scattered pulmonary nodules, largest 8 mm at the left lung base, possibly metastatic. 5. Metastatic lesions in the T6 and T9 vertebrae, unchanged. Electronically signed by: Elsie Gravely MD 08/08/2024 07:22 PM EST RP Workstation: [id number]   CT ABDOMEN PELVIS W CONTRAST Result Date: 08/08/2024 EXAM: CT ABDOMEN AND PELVIS WITH CONTRAST 08/08/2024 06:48:22 PM TECHNIQUE: CT of the abdomen and pelvis was performed with the administration of intravenous contrast. Multiplanar reformatted images are provided for review. Automated exposure control, iterative reconstruction, and/or weight-based adjustment of the mA/kV was utilized to reduce the radiation dose to  as low as reasonably achievable. COMPARISON: Comparison with 07/01/2024. CLINICAL HISTORY: Abdominal pain, acute, nonlocalized. FINDINGS: LOWER CHEST: Emphysematous changes and scarring in the lung bases. LIVER: Numerous tiny subcentimeter low attenuation lesions throughout the liver are similar to the prior study. This suggests biliary hematomas indicating a benign etiology. GALLBLADDER AND BILE DUCTS: Gallbladder is unremarkable. No biliary ductal dilatation. SPLEEN: No acute abnormality. PANCREAS: No acute abnormality. ADRENAL GLANDS: No acute abnormality. KIDNEYS, URETERS AND BLADDER: Atrophic right kidney with delayed nephrogram suggesting chronic renovascular disease. Bilateral renal cysts measuring up to 3 cm diameter. No change. No imaging follow-up is indicated. No stones in the kidneys or ureters. No hydronephrosis. No perinephric or periureteral stranding. Nodular enlargement of the prostate gland causing an effusion on the bladder base. GI AND BOWEL: Stool-filled colon without abnormal distention or wall thickening. The stomach and small bowel are decompressed. The appendix is not identified. There is no bowel obstruction. PERITONEUM AND RETROPERITONEUM: No ascites. No free air. VASCULATURE: Infrarenal abdominal aortic aneurysm measuring 3.2 x 4.2 cm in diameter and containing mural thrombus and mural calcification. Measurements are similar to the prior study. 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 LYMPH NODES: No lymphadenopathy. REPRODUCTIVE ORGANS: Nodular enlargement of the prostate gland causing an effusion on the bladder base. BONES AND SOFT TISSUES: Acute central compression fracture of the L3 vertebra, new since prior study. No retropulsion of fracture fragments. Posterior elements are not involved. Old compression deformity of L4 is unchanged. No focal soft tissue abnormality. IMPRESSION: 1. Acute central compression fracture of the L3 vertebra,  new since prior study; no retropulsion and posterior elements spared. 2. Infrarenal abdominal aortic aneurysm measuring 3.2 x 4.2 cm with mural thrombus and mural calcification, similar to prior; recommend ultrasound follow-up in 3 years. 3. 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 4. Atrophic right kidney with delayed nephrogram suggesting chronic renovascular disease. 5. Bilateral renal cysts up to 3 cm, stable benign cysts with no imaging follow-up recommended. 6. Nodular enlargement of the prostate gland impressing upon the bladder base. Electronically signed by: Elsie Gravely MD 08/08/2024 07:15 PM EST RP Workstation: [id number]   DG Chest Portable 1 View Result Date: 08/08/2024 EXAM: 1 VIEW(S) XRAY OF THE CHEST 08/08/2024 06:31:00 PM COMPARISON: Comparison with 07/01/2024. CLINICAL HISTORY: SOB, lung cancer. FINDINGS: LUNGS AND PLEURA: Emphysematous changes and scattered fibrosis in the lungs. Scarring in the lung apices and bases. No airspace disease or consolidation. No pleural effusion or pneumothorax. HEART AND MEDIASTINUM: Mediastinal contours appear intact. Normal heart size and pulmonary vascularity. Calcification of the aorta. BONES AND SOFT TISSUES: Old left rib fracture deformities. No acute osseous abnormality. IMPRESSION: 1. No acute cardiopulmonary process. 2. Emphysematous changes and scattered pulmonary fibrosis, which can contribute to shortness of breath. Electronically signed by: Elsie Gravely MD 08/08/2024  07:06 PM EST RP Workstation: [id number]        Scheduled Meds:  feeding supplement  237 mL Oral BID BM   heparin   5,000 Units Subcutaneous Q8H   lidocaine   1 patch Transdermal Q24H   magnesium  gluconate  500 mg Oral Daily   methocarbamol   500 mg Oral TID   metoprolol  succinate  12.5 mg Oral QHS   morphine   15 mg Oral Q12H   polyethylene glycol  17 g Oral Daily   senna-docusate  2 tablet Oral QHS   tamsulosin   0.4 mg  Oral QHS   Continuous Infusions:     LOS: 1 day    Time spent: 45 minutes spent on chart review, discussion with nursing staff, consultants, updating family and interview/physical exam; more than 50% of that time was spent in counseling and/or coordination of care.    Harlene RAYMOND Bowl, DO [facility] Available via Epic secure chat 7am-7pm After these hours, please refer to coverage provider listed on [email or web address] 08/10/2024, 1:07 PM

## 2024-08-10 NOTE — Evaluation (Signed)
 Physical Therapy Evaluation  Patient Details Name: Roberto WILBOURNE [name] MRN: [id number] DOB: [birth year] Today's Date: 08/10/2024  History of Present Illness  Pt is an 83 y/o male who presented to the ED 08/08/2024 for evaluation of abdominal pain and constipation and admitted for AKI. PMH significant for CAD, HTN, migraine, PVD, CVA, AAA, BPH, renal lesion, PTSD, depression, metastatic lung cancer (diagnosed in June 2025) status post SBR and bone metastases.   Clinical Impression  Pt admitted with above diagnosis. Pt currently with functional limitations due to the deficits listed below (see PT Problem List). At the time of PT eval pt was able to perform transfers and ambulation with gross CGA to supervision for safety and RW for support. Pt appears to be near baseline of function. He reports that he has requested increased hours for his PCA who is currently coming ~6 hours/week. He also reports that he would like to transition to an assisted living facility. Feel ALF would be an appropriate environment given his self-reported dementia, and difficulty with bathing and iADL's such as cooking/cleaning. Recommend HHPT follow up, with increased PCA hours vs ALF. Pt will benefit from acute skilled PT to increase their independence and safety with mobility to allow discharge.           If plan is discharge home, recommend the following: A little help with walking and/or transfers;A little help with bathing/dressing/bathroom;Assistance with cooking/housework;Assist for transportation;Supervision due to cognitive status   Can travel by private vehicle        Equipment Recommendations None recommended by PT  Recommendations for Other Services       Functional Status Assessment Patient has had a recent decline in their functional status and demonstrates the ability to make significant improvements in function in a reasonable and predictable amount of time.     Precautions / Restrictions  Precautions Precautions: None Recall of Precautions/Restrictions: Intact Restrictions Weight Bearing Restrictions Per Provider Order: No      Mobility  Bed Mobility Overal bed mobility: Modified Independent             General bed mobility comments: Pt was able to transition to EOB without assist. Increased time required.    Transfers Overall transfer level: Needs assistance Equipment used: Rolling walker (2 wheels) Transfers: Sit to/from Stand Sit to Stand: Supervision           General transfer comment: Light supervision for safety. Pt powered up to full stand without difficulty and without overt LOB.    Ambulation/Gait Ambulation/Gait assistance: Contact guard assist Gait Distance (Feet): 200 Feet Assistive device: Rolling walker (2 wheels) Gait Pattern/deviations: Step-through pattern, Decreased stride length, Trunk flexed, Narrow base of support Gait velocity: Decreased Gait velocity interpretation: 1.31 - 2.62 ft/sec, indicative of limited community ambulator   General Gait Details: Decreased gait speed but generally steady with RW for support. No overt LOB noted, even with turns.  Stairs            Wheelchair Mobility     Tilt Bed    Modified Rankin (Stroke Patients Only)       Balance Overall balance assessment: Needs assistance Sitting-balance support: Feet supported, No upper extremity supported Sitting balance-Leahy Scale: Fair     Standing balance support: No upper extremity supported, During functional activity Standing balance-Leahy Scale: Fair Standing balance comment: standing statically without UE support.  Pertinent Vitals/Pain Pain Assessment Pain Assessment: No/denies pain    Home Living Family/patient expects to be discharged to:: Private residence Living Arrangements: Alone Available Help at Discharge: Personal care attendant;Available PRN/intermittently (MWF 2 hours per day - 6  hours/week total) Type of Home: Apartment Home Access: Level entry       Home Layout: One level Home Equipment: Cane - single point;Shower seat;BSC/3in1;Electric scooter;Grab bars - toilet;Grab bars - tub/shower (3 wheeled walker with seat)      Prior Function Prior Level of Function : Independent/Modified Independent;Driving             Mobility Comments: Uses rollator or SPC in the home and scooter outside the home. Denies falls ADLs Comments: Manages medications. Schedules transportation for [facility] appointments in [place]. States that he does his grocery shopping but does not cook. [employer] Whe[employer] delivers to him.     Extremity/Trunk Assessment   Upper Extremity Assessment Upper Extremity Assessment: Right hand dominant    Lower Extremity Assessment Lower Extremity Assessment: Generalized weakness    Cervical / Trunk Assessment Cervical / Trunk Assessment: Kyphotic (With forward head posture and rounded shoulders)  Communication   Communication Communication: No apparent difficulties    Cognition Arousal: Alert Behavior During Therapy: Flat affect, Anxious   PT - Cognitive impairments: History of cognitive impairments                       PT - Cognition Comments: Pt reports that he has dementia, and has difficulty remembering what he was trying to say when people interrupt him. Following commands: Intact       Cueing Cueing Techniques: Verbal cues     General Comments      Exercises     Assessment/Plan    PT Assessment Patient needs continued PT services  PT Problem List Decreased strength;Decreased activity tolerance;Decreased balance;Decreased mobility;Decreased cognition;Decreased knowledge of use of DME;Decreased safety awareness       PT Treatment Interventions DME instruction;Gait training;Functional mobility training;Therapeutic activities;Therapeutic exercise;Balance training;Patient/family education    PT Goals (Current goals  can be found in the Care Plan section)  Acute Rehab PT Goals Patient Stated Goal: Be able to go to an assisted living facility PT Goal Formulation: With patient Time For Goal Achievement: 08/24/24 Potential to Achieve Goals: Good    Frequency Min 2X/week     Co-evaluation               AM-PAC PT 6 Clicks Mobility  Outcome Measure Help needed turning from your back to your side while in a flat bed without using bedrails?: A Little Help needed moving from lying on your back to sitting on the side of a flat bed without using bedrails?: A Little Help needed moving to and from a bed to a chair (including a wheelchair)?: A Little Help needed standing up from a chair using your arms (e.g., wheelchair or bedside chair)?: A Little Help needed to walk in hospital room?: A Little Help needed climbing 3-5 steps with a railing? : A Little 6 Click Score: 18    End of Session Equipment Utilized During Treatment: Gait belt Activity Tolerance: Patient tolerated treatment well Patient left: with call bell/phone within reach;Other (comment) (In bathroom having a BM) Nurse Communication: Mobility status PT Visit Diagnosis: Unsteadiness on feet (R26.81);Difficulty in walking, not elsewhere classified (R26.2);Muscle weakness (generalized) (M62.81)    Time: 1115-1130 PT Time Calculation (min) (ACUTE ONLY): 15 min   Charges:   PT Evaluation $  PT Eval Moderate Complexity: 1 Mod   PT General Charges $$ ACUTE PT VISIT: 1 Visit         Leita Sable, PT, DPT Acute Rehabilitation Services Secure Chat Preferred Office: [phone]   Leita JONETTA Sable 08/10/2024, 1:35 PM

## 2024-08-11 ENCOUNTER — Encounter (HOSPITAL_COMMUNITY): Payer: Self-pay

## 2024-08-11 ENCOUNTER — Other Ambulatory Visit (HOSPITAL_COMMUNITY): Payer: Self-pay

## 2024-08-11 DIAGNOSIS — N179 Acute kidney failure, unspecified: Secondary | ICD-10-CM | POA: Diagnosis not present

## 2024-08-11 MED ORDER — SENNOSIDES-DOCUSATE SODIUM 8.6-50 MG PO TABS
2.0000 | ORAL_TABLET | Freq: Every day | ORAL | 0 refills | Status: AC
  Filled 2024-08-11: qty 60, 30d supply, fill #0

## 2024-08-11 MED ORDER — POLYETHYLENE GLYCOL 3350 17 GM/SCOOP PO POWD
17.0000 g | Freq: Every day | ORAL | 0 refills | Status: AC
  Filled 2024-08-11: qty 238, 14d supply, fill #0

## 2024-08-11 MED ORDER — MAGNESIUM GLUCONATE 500 (27 MG) MG PO TABS
500.0000 mg | ORAL_TABLET | Freq: Every day | ORAL | 0 refills | Status: AC
  Filled 2024-08-11: qty 30, 30d supply, fill #0

## 2024-08-11 NOTE — Plan of Care (Signed)
 Pt given D/C instructions with verbal understanding. Rx's were picked up from pharmacy and given to the Pt at D/C. Pt's IV was removed prior to D/C. Home Health and Palliative Care was arranged by TOC prior to D/C. Cab voucher was given to the Pt by TOC. Pt D/C'd home via wheelchair per MD order. Pt is stable @ D/C and has no other needs at this time. Rosina Rakers, RN

## 2024-08-11 NOTE — Progress Notes (Signed)
 Pt's home meds were picked up from the pharmacy and given to the Pt at D/C. Rosina Rakers, RN

## 2024-08-11 NOTE — Discharge Summary (Signed)
 Physician Discharge Summary  Roberto Wallace FMW:[id number] DOB: [birth year] DOA: 08/08/2024  PCP: Norleen Lynwood ORN, MD  Admit date: 08/08/2024 Discharge date: 08/11/2024  Admitted From:  Discharge disposition: Home   Recommendations for Outpatient Follow-Up:   Care manager has increased his help at home Referral to palliative care Needs aggressive bowel regimen Blood pressure meds held Outpatient vascular surgery follow-up   Discharge Diagnosis:   Principal Problem:   AKI (acute kidney injury) Active Problems:   Essential hypertension   Fecal impaction (HCC)   Lower abdominal pain   Closed compression fracture of L3 lumbar vertebra, initial encounter (HCC)   Primary malignant neoplasm of lung metastatic to other site [facility])   Lung cancer metastatic to bone [facility])   Cancer-related pain   History of abdominal aortic aneurysm (AAA)    Discharge Condition: Improved.  Diet recommendation: .  Regular.  Wound care: None.  Code status: Full.   History of Present Illness:   Roberto Wallace. is a 83 y.o. male with medical history significant for CAD, HTN, HLD, migraine, PVD, CVA, AAA, renal lesion, PTSD, vitamin D  deficiency, depression, metastatic lung cancer (diagnosed in June 2025) status post SBR and bone metastases who presented to the ED for evaluation of abdominal pain and constipation.  She reports that over the last 3 months, he has had constipation with last good bowel movement 6 months ago. He has tried some laxatives without significant relief and due to worsening lower abdominal pain today, he presented to the ED for evaluation.  He endorses poor p.o. intake due to the constipation but denies any nausea, vomiting, fevers, chills, dizziness, dysuria or bloody stools.  Reports unintentional weight loss of about 20 pounds in the last few weeks.    ED Course: Initial vitals show patient afebrile, mild tachycardia with HR 90-100s, SBP 110-120s. Initial  labs significant for BUNs/creatinine 35/1.76, bicarb 20, anion gap 20, Hgb 11.1, lactic acid 5.9, UA shows mild ketonuria but no signs of infection. CXR shows emphysematous changes and scattered pulmonary fibrosis but no active disease.  CT A/P shows acute central compression fraction of the L3 vertebrae, stable infrarenal abdominal aortic aneurysm but no acute intra-abdominal pathology. CTA chest PE study negative for PE but shows severe emphysema and unchanged pulmonary nodules and metastatic T6 and T9 vertebrae lesions.  Pt received IV Dilaudid , IV morphine , IV fentanyl , IV Zofran , IV NS 1 L bolus.  Manual disimpaction was performed by EDP and patient was given mineral oil enema x 1. TRH was consulted for admission.    Hospital Course by Problem:   AKI - resolved   Hypokalemia/hypomagenesemia -repleted   Constipation/Fecal impaction -Lower abdominal pain - Patient with history of cancer related pain on daily and as needed opioids now presenting with months of constipation and worsening lower abdominal pain - Patient found to have severe impaction, received manual disimpaction in the ED followed by mineral oil enema per Dr. Lou - Reports significant improvement in abdominal pain after manual disimpaction - Aggressive bowel regimen with scheduled scheduled MiraLAX  and Senokot-S, as needed Dulcolax    Metastatic lung cancer/Osseous metastases - Diagnosed in June 2025, completed SBRT and currently undergoing chemotherapy at the [facility], last chemo 3 weeks ago with next chemo on 12/20 - Imaging on admission shows stable scattered pulmonary nodules, largest 8 mm at the left lung base and metastatic lesions in the T6 and T9 vertebrae - [facility] in [facility]   Cancer  related pain/ Chronic back pain - Continue multimodal pain control with lidocaine  patch, scheduled Tylenol , Robaxin , morphine  and as needed oxycodone  - Start bowel regimen with daily MiraLAX  and Senokot-S    HTN -holding home meds due to hypotension   AAA - Repeat imaging shows stable infrarenal abdominal aortic aneurysm measuring 3.2 x 4.2 cm thrombus calcification - Ultrasound recommended for follow-up in 3 years - Follow-up with vascular surgery in the outpatient   BPH - Continue Flomax       Medical Consultants:   Care management   Discharge Exam:   Vitals:   08/10/24 2305 08/11/24 0454  BP: 106/74 100/75  Pulse: 93 95  Resp: 18 18  Temp: 100 F (37.8 C) 98.9 F (37.2 C)  SpO2: 100% 100%   Vitals:   08/10/24 1705 08/10/24 2015 08/10/24 2305 08/11/24 0454  BP: 106/71 103/69 106/74 100/75  Pulse: 86 96 93 95  Resp: 17 18 18 18   Temp: 98 F (36.7 C) 98.8 F (37.1 C) 100 F (37.8 C) 98.9 F (37.2 C)  TempSrc:  Oral Oral Oral  SpO2: 100% 100% 100% 100%  Weight:      Height:        General exam: Appears calm and comfortable.   The results of significant diagnostics from this hospitalization (including imaging, microbiology, ancillary and laboratory) are listed below for reference.     Procedures and Diagnostic Studies:   CT Angio Chest PE W and/or Wo Contrast Result Date: 08/08/2024 EXAM: CTA of the Chest with contrast for PE 08/08/2024 06:48:22 PM TECHNIQUE: CTA of the chest was performed after the administration of intravenous contrast. Multiplanar reformatted images are provided for review. MIP images are provided for review. Automated exposure control, iterative reconstruction, and/or weight based adjustment of the mA/kV was utilized to reduce the radiation dose to as low as reasonably achievable. COMPARISON: Chest radiograph 08/08/2024 and CT chest 07/01/2024. CLINICAL HISTORY: Lung cancer, dyspnea. FINDINGS: PULMONARY ARTERIES: Good opacification of the central and segmental pulmonary arteries. No focal filling defects. No evidence of significant pulmonary embolus. Main pulmonary artery is normal in caliber. MEDIASTINUM: Normal heart size. No pericardial  effusions. Normal caliber thoracic aorta. No aortic dissection. Calcification of the aorta and coronary arteries. Esophagus is decompressed. LYMPH NODES: Mediastinal lymphadenopathy is again demonstrated with pretracheal nodes measuring up to 2.7 cm in diameter. Left hilar lymph nodes measure up to 1.5 cm diameter. Subcarinal lymph nodes measure up to 2.4 cm diameter. Similar appearance to prior study. LUNGS AND PLEURA: Severe emphysematous changes in the lungs with scattered fibrosis. Scattered pulmonary nodules, largest in the left lung base, series 10 image 116, measuring 8 mm diameter. Similar appearance to prior study. No pleural effusion or pneumothorax. UPPER ABDOMEN: See additional report of CT abdomen and pelvis obtained today. SOFT TISSUES AND BONES: Metastatic lesions in the T6 and T9 vertebrae are unchanged. Old rib fractures. No acute soft tissue abnormality. IMPRESSION: 1. No evidence of pulmonary embolism. 2. Severe emphysema with scattered fibrosis. 3. Mediastinal and left hilar lymphadenopathy, corresponding to known metastases, unchanged. 4. Scattered pulmonary nodules, largest 8 mm at the left lung base, possibly metastatic. 5. Metastatic lesions in the T6 and T9 vertebrae, unchanged. Electronically signed by: Elsie Gravely MD 08/08/2024 07:22 PM EST RP Workstation: [id number]   CT ABDOMEN PELVIS W CONTRAST Result Date: 08/08/2024 EXAM: CT ABDOMEN AND PELVIS WITH CONTRAST 08/08/2024 06:48:22 PM TECHNIQUE: CT of the abdomen and pelvis was performed with the administration of intravenous contrast. Multiplanar reformatted images are provided  for review. Automated exposure control, iterative reconstruction, and/or weight-based adjustment of the mA/kV was utilized to reduce the radiation dose to as low as reasonably achievable. COMPARISON: Comparison with 07/01/2024. CLINICAL HISTORY: Abdominal pain, acute, nonlocalized. FINDINGS: LOWER CHEST: Emphysematous changes and scarring in the lung bases.  LIVER: Numerous tiny subcentimeter low attenuation lesions throughout the liver are similar to the prior study. This suggests biliary hematomas indicating a benign etiology. GALLBLADDER AND BILE DUCTS: Gallbladder is unremarkable. No biliary ductal dilatation. SPLEEN: No acute abnormality. PANCREAS: No acute abnormality. ADRENAL GLANDS: No acute abnormality. KIDNEYS, URETERS AND BLADDER: Atrophic right kidney with delayed nephrogram suggesting chronic renovascular disease. Bilateral renal cysts measuring up to 3 cm diameter. No change. No imaging follow-up is indicated. No stones in the kidneys or ureters. No hydronephrosis. No perinephric or periureteral stranding. Nodular enlargement of the prostate gland causing an effusion on the bladder base. GI AND BOWEL: Stool-filled colon without abnormal distention or wall thickening. The stomach and small bowel are decompressed. The appendix is not identified. There is no bowel obstruction. PERITONEUM AND RETROPERITONEUM: No ascites. No free air. VASCULATURE: Infrarenal abdominal aortic aneurysm measuring 3.2 x 4.2 cm in diameter and containing mural thrombus and mural calcification. Measurements are similar to the prior study. 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 LYMPH NODES: No lymphadenopathy. REPRODUCTIVE ORGANS: Nodular enlargement of the prostate gland causing an effusion on the bladder base. BONES AND SOFT TISSUES: Acute central compression fracture of the L3 vertebra, new since prior study. No retropulsion of fracture fragments. Posterior elements are not involved. Old compression deformity of L4 is unchanged. No focal soft tissue abnormality. IMPRESSION: 1. Acute central compression fracture of the L3 vertebra, new since prior study; no retropulsion and posterior elements spared. 2. Infrarenal abdominal aortic aneurysm measuring 3.2 x 4.2 cm with mural thrombus and mural calcification, similar to prior; recommend  ultrasound follow-up in 3 years. 3. 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 4. Atrophic right kidney with delayed nephrogram suggesting chronic renovascular disease. 5. Bilateral renal cysts up to 3 cm, stable benign cysts with no imaging follow-up recommended. 6. Nodular enlargement of the prostate gland impressing upon the bladder base. Electronically signed by: Elsie Gravely MD 08/08/2024 07:15 PM EST RP Workstation: [id number]   DG Chest Portable 1 View Result Date: 08/08/2024 EXAM: 1 VIEW(S) XRAY OF THE CHEST 08/08/2024 06:31:00 PM COMPARISON: Comparison with 07/01/2024. CLINICAL HISTORY: SOB, lung cancer. FINDINGS: LUNGS AND PLEURA: Emphysematous changes and scattered fibrosis in the lungs. Scarring in the lung apices and bases. No airspace disease or consolidation. No pleural effusion or pneumothorax. HEART AND MEDIASTINUM: Mediastinal contours appear intact. Normal heart size and pulmonary vascularity. Calcification of the aorta. BONES AND SOFT TISSUES: Old left rib fracture deformities. No acute osseous abnormality. IMPRESSION: 1. No acute cardiopulmonary process. 2. Emphysematous changes and scattered pulmonary fibrosis, which can contribute to shortness of breath. Electronically signed by: Elsie Gravely MD 08/08/2024 07:06 PM EST RP Workstation: [id number]     Labs:   Basic Metabolic Panel: Recent Labs  Lab 08/08/24 1607 08/09/24 0500 08/10/24 0457  NA 136 137 136  K 3.9 3.2* 4.1  CL 96* 105 105  CO2 20* 22 26  GLUCOSE 123* 81 97  BUN 35* 26* 20  CREATININE 1.76* 1.35* 1.03  CALCIUM  9.0 7.5* 7.8*  MG  --  1.6*  --    GFR Estimated Creatinine Clearance: 40.4 mL/min (by C-G formula based on SCr of 1.03 mg/dL).  Liver Function Tests: Recent Labs  Lab 08/08/24 1607  AST 33  ALT 22  ALKPHOS 74  BILITOT 1.1  PROT 7.5  ALBUMIN 3.2*   Recent Labs  Lab 08/08/24 1607  LIPASE 32   No results for input(s): AMMONIA in the  last 168 hours. Coagulation profile No results for input(s): INR, PROTIME in the last 168 hours.  CBC: Recent Labs  Lab 08/08/24 1607 08/09/24 0500 08/10/24 0457  WBC 9.4 7.3 7.9  NEUTROABS 7.7  --   --   HGB 11.1* 9.1* 9.1*  HCT 35.0* 28.8* 28.7*  MCV 85.0 86.5 84.7  PLT 292 210 237   Cardiac Enzymes: No results for input(s): CKTOTAL, CKMB, CKMBINDEX, TROPONINI in the last 168 hours. BNP: Invalid input(s): POCBNP CBG: No results for input(s): GLUCAP in the last 168 hours. D-Dimer No results for input(s): DDIMER in the last 72 hours. Hgb A1c No results for input(s): HGBA1C in the last 72 hours. Lipid Profile No results for input(s): CHOL, HDL, LDLCALC, TRIG, CHOLHDL, LDLDIRECT in the last 72 hours. Thyroid  function studies No results for input(s): TSH, T4TOTAL, T3FREE, THYROIDAB in the last 72 hours.  Invalid input(s): FREET3 Anemia work up No results for input(s): VITAMINB12, FOLATE, FERRITIN, TIBC, IRON, RETICCTPCT in the last 72 hours. Microbiology No results found for this or any previous visit (from the past 240 hours).   Discharge Instructions:   Discharge Instructions     Diet general   Complete by: As directed    Discharge instructions   Complete by: As directed    Need aggressive bowel regimen to avoid the constipation caused by pain meds   Increase activity slowly   Complete by: As directed       Allergies as of 08/11/2024       Reactions   Amlodipine Rash   Brinzolamide-brimonidine Other (See Comments)   Not allergic per pt.    Pollen Extract Other (See Comments)   Sinusitis        Medication List     STOP taking these medications    ezetimibe  10 MG tablet Commonly known as: ZETIA    losartan  100 MG tablet Commonly known as: COZAAR    sertraline  25 MG tablet Commonly known as: ZOLOFT        TAKE these medications    feeding supplement Liqd Take 237 mLs by mouth 2 (two)  times daily between meals.   lidocaine  5 % Commonly known as: LIDODERM  Place 1 patch onto the skin daily. Remove & Discard patch within 12 hours or as directed by MD   magnesium  gluconate 500 (27 Mg) MG Tabs tablet Commonly known as: MAGONATE Take 1 tablet (500 mg total) by mouth daily.   memantine  5 MG tablet Commonly known as: NAMENDA  Take 1 tablet (5 mg total) by mouth 2 (two) times daily.   methocarbamol  500 MG tablet Commonly known as: ROBAXIN  Take 1 tablet (500 mg total) by mouth 3 (three) times daily.   morphine  15 MG 12 hr tablet Commonly known as: MS CONTIN  Take 1 tablet (15 mg total) by mouth every 12 (twelve) hours.   oxyCODONE  5 MG immediate release tablet Commonly known as: Oxy IR/ROXICODONE  Take 1 tablet (5 mg total) by mouth every 6 (six) hours as needed for severe pain (pain score 7-10) or breakthrough pain.   polyethylene glycol 17 g packet Commonly known as: MIRALAX  / GLYCOLAX  Take 17 g by mouth daily.   senna-docusate 8.6-50 MG tablet Commonly known as: Senokot-S Take 2 tablets by  mouth at bedtime.   tamsulosin  0.4 MG Caps capsule Commonly known as: FLOMAX  Take 0.4 mg by mouth at bedtime.        Contact information for follow-up providers     [facility] Follow up.   Specialty: PALLIATIVE CARE Why: Palliative care will contact you for the first home visit Contact information: [address] [facility] [place]  [id number] [phone]             Contact information for after-discharge care     Home Medical Care     [facility]) .   Service: Home Health Services Contact information: [address] [place]   [address] [phone]                      Time coordinating discharge: 45 min  Signed:  Harlene RAYMOND Bowl DO  [facility] 08/11/2024, 8:06 AM

## 2024-08-11 NOTE — TOC Transition Note (Signed)
 Transition of Care [facility]) - Discharge Note   Patient Details  Name: Roberto SCHOENFELD [name] MRN: [id number] Date of Birth: 1941/04/04  Transition of Care [facility]) CM/SW Contact:  Andrez JULIANNA George, RN Phone Number: 08/11/2024, 10:21 AM   Clinical Narrative:     Pt is discharging home with home health services through [place] and palliative care through [facility]. Information on the AVS.  Pt will transport home via cab with voucher provided to bedside RN.  Pt has needed DME at home.  CM has talked again with community SW for [facility] and updated on plan.   Final next level of care: Home w Home Health Services Barriers to Discharge: No Barriers Identified   Patient Goals and CMS Choice   CMS Medicare.gov Compare Post Acute Care list provided to:: Patient Choice offered to / list presented to : Patient      Discharge Placement                       Discharge Plan and Services Additional resources added to the After Visit Summary for     Discharge Planning Services: CM Consult Post Acute Care Choice: Home Health                    HH Arranged: RN, PT, OT, Nurse's Aide, Social Work [facility] Agency: [facility] Date [facility] Agency Contacted: 08/10/24   Representative spoke with at [facility] Agency: cory  Social Drivers of Health (SDOH) Interventions SDOH Screenings   Food Insecurity: Food Insecurity Present (08/09/2024)  Housing: High Risk (08/09/2024)  Transportation Needs: Unmet Transportation Needs (08/09/2024)  Utilities: Not At Risk (08/09/2024)  Alcohol Screen: Low Risk (08/17/2023)  Depression (PHQ2-9): Low Risk (01/14/2024)  Financial Resource Strain: Low Risk (08/17/2023)  Physical Activity: Inactive (08/17/2023)  Social Connections: Socially Isolated (08/09/2024)  Stress: No Stress Concern Present (08/17/2023)  Tobacco Use: Medium Risk (08/08/2024)  Health Literacy: Adequate Health Literacy (08/17/2023)     Readmission Risk Interventions     No  data to display

## 2024-08-12 ENCOUNTER — Telehealth: Payer: Self-pay

## 2024-08-12 NOTE — Transitions of Care (Post Inpatient/ED Visit) (Unsigned)
" ° °  08/12/2024  Name: Roberto Wallace [name] MRN: [id number] DOB: 1941/02/09  Today's TOC FU Call Status: Today's TOC FU Call Status:: Unsuccessful Call (1st Attempt) Unsuccessful Call (1st Attempt) Date: 08/12/24  Attempted to reach the patient regarding the most recent Inpatient/ED visit.  Follow Up Plan: Additional outreach attempts will be made to reach the patient to complete the Transitions of Care (Post Inpatient/ED visit) call.   Signature Julian Lemmings, LPN [facility] Nurse Health Advisor Direct Dial [phone]  "

## 2024-08-15 ENCOUNTER — Telehealth: Payer: Self-pay

## 2024-08-15 NOTE — Transitions of Care (Post Inpatient/ED Visit) (Signed)
" ° °  08/15/2024  Name: Roberto SENTELL [name] MRN: [id number] DOB: [birth year]  Today's TOC FU Call Status: Today's TOC FU Call Status:: Unsuccessful Call (1st Attempt) Unsuccessful Call (1st Attempt) Date: 08/15/24  Attempted to reach the patient regarding the most recent Inpatient/ED visit.  Follow Up Plan: Additional outreach attempts will be made to reach the patient to complete the Transitions of Care (Post Inpatient/ED visit) call.   Signature Julian Lemmings, LPN [facility] Nurse Health Advisor Direct Dial [phone]  "

## 2024-08-15 NOTE — Transitions of Care (Post Inpatient/ED Visit) (Signed)
 "  08/15/2024  Name: Roberto STALLBAUMER [name] MRN: [id number] DOB: 22-Mar-1941  Today's TOC FU Call Status: Today's TOC FU Call Status:: Successful TOC FU Call Completed Unsuccessful Call (1st Attempt) Date: 08/15/24 [name] FU Call Complete Date: 08/15/24  Patient's Name and Date of Birth confirmed. Name, DOB  Transition Care Management Follow-up Telephone Call Date of Discharge: 08/11/24 Discharge Facility: [facility]) Type of Discharge: Inpatient Admission Primary Inpatient Discharge Diagnosis:: AKF How have you been since you were released from the hospital?: Better Any questions or concerns?: No  Items Reviewed: Did you receive and understand the discharge instructions provided?: Yes Medications obtained,verified, and reconciled?: Yes (Medications Reviewed) Any new allergies since your discharge?: No Dietary orders reviewed?: Yes Do you have support at home?: No  Medications Reviewed Today: Medications Reviewed Today     Reviewed by Emmitt Pan, LPN (Licensed Practical Nurse) on 08/15/24 at 1504  Med List Status: <None>   Medication Order Taking? Sig Documenting Provider Last Dose Status Informant  feeding supplement (ENSURE PLUS HIGH PROTEIN) LIQD [id number]  Take 237 mLs by mouth 2 (two) times daily between meals.  Patient not taking: Reported on 08/15/2024   [name], MD  Active Self, Pharmacy Records, Other  lidocaine  (LIDODERM ) 5 % [id number]  Place 1 patch onto the skin daily. Remove & Discard patch within 12 hours or as directed by MD  Patient not taking: Reported on 08/15/2024   [name], MD  Active Self, Pharmacy Records, Other  magnesium  gluconate (MAGONATE) 500 (27 Mg) MG TABS tablet [id number] Yes Take 1 tablet (500 mg total) by mouth daily. Vann, Jessica U, DO  Active   memantine  (NAMENDA ) 5 MG tablet [id number] Yes Take 1 tablet (5 mg total) by mouth 2 (two) times daily. [name], MD  Active Self, Pharmacy Records, Other            Med Note LEOBARDO, NICOLE   Tue Aug 09, 2024 12:56 AM) Verified by medication bottle, however it expired 07/24/2021.  methocarbamol  (ROBAXIN ) 500 MG tablet [id number] Yes Take 1 tablet (500 mg total) by mouth 3 (three) times daily. [name], MD  Active Self, Pharmacy Records, Other           Med Note LEOBARDO, NICOLE   Tue Aug 09, 2024 12:55 AM) Verified by medication bottle.   morphine  (MS CONTIN ) 15 MG 12 hr tablet [id number] Yes Take 1 tablet (15 mg total) by mouth every 12 (twelve) hours. [name], MD  Active Self, Pharmacy Records, Other  oxyCODONE  (OXY IR/ROXICODONE ) 5 MG immediate release tablet [id number] Yes Take 1 tablet (5 mg total) by mouth every 6 (six) hours as needed for severe pain (pain score 7-10) or breakthrough pain. [name], MD  Active Self, Pharmacy Records, Other  polyethylene glycol powder (GLYCOLAX /MIRALAX ) 17 GM/SCOOP powder [id number] Yes Take 17 g by mouth daily. Dissolve 1 capful (17g) in 4-8 ounces of liquid and take by mouth daily. Vann, Jessica U, DO  Active   senna-docusate (SENOKOT-S) 8.6-50 MG tablet [id number] Yes Take 2 tablets by mouth at bedtime. Vann, Jessica U, DO  Active   tamsulosin  (FLOMAX ) 0.4 MG CAPS capsule [id number]  Take 0.4 mg by mouth at bedtime.  Patient not taking: Reported on 08/15/2024   [provider]  Active Self, Pharmacy Records, Other  Med List Note Lorne, Sheffield, CPhT 07/01/24 1014): [facility]             Home Care and Equipment/Supplies: Were  Home Health Services Ordered?: NA Any new equipment or medical supplies ordered?: NA  Functional Questionnaire: Do you need assistance with bathing/showering or dressing?: Yes Do you need assistance with meal preparation?: Yes Do you need assistance with eating?: No Do you have difficulty maintaining continence: No Do you need assistance with getting out of bed/getting out of a chair/moving?: No Do you have difficulty managing or taking your medications?:  No  Follow up appointments reviewed: PCP Follow-up appointment confirmed?: Yes Date of PCP follow-up appointment?: 08/23/24 Follow-up Provider: [facility] Follow-up appointment confirmed?: NA Do you need transportation to your follow-up appointment?: No Do you understand care options if your condition(s) worsen?: Yes-patient verbalized understanding    SIGNATURE Julian Lemmings, LPN [facility] Nurse Health Advisor Direct Dial [phone]  "

## 2024-08-23 ENCOUNTER — Encounter: Payer: Self-pay | Admitting: Internal Medicine

## 2024-08-23 ENCOUNTER — Ambulatory Visit: Admitting: Internal Medicine

## 2024-08-23 VITALS — BP 124/76 | HR 95 | Temp 97.6°F | Ht 69.0 in | Wt 101.0 lb

## 2024-08-23 DIAGNOSIS — C3490 Malignant neoplasm of unspecified part of unspecified bronchus or lung: Secondary | ICD-10-CM | POA: Insufficient documentation

## 2024-08-23 DIAGNOSIS — M159 Polyosteoarthritis, unspecified: Secondary | ICD-10-CM

## 2024-08-23 DIAGNOSIS — K5900 Constipation, unspecified: Secondary | ICD-10-CM | POA: Insufficient documentation

## 2024-08-23 DIAGNOSIS — I714 Abdominal aortic aneurysm, without rupture, unspecified: Secondary | ICD-10-CM | POA: Insufficient documentation

## 2024-08-23 DIAGNOSIS — C642 Malignant neoplasm of left kidney, except renal pelvis: Secondary | ICD-10-CM | POA: Insufficient documentation

## 2024-08-23 DIAGNOSIS — Z5982 Transportation insecurity: Secondary | ICD-10-CM | POA: Insufficient documentation

## 2024-08-23 DIAGNOSIS — Z659 Problem related to unspecified psychosocial circumstances: Secondary | ICD-10-CM | POA: Insufficient documentation

## 2024-08-23 MED ORDER — MELOXICAM 15 MG PO TABS: 15.0000 mg | ORAL_TABLET | Freq: Every day | ORAL | 1 refills | Status: AC | PRN

## 2024-08-23 NOTE — Assessment & Plan Note (Signed)
 Much improved, pt to continue bowel regimen daily

## 2024-08-23 NOTE — Assessment & Plan Note (Signed)
 Pt states will fu with oncology at [facility] soon, as his is preference  - Diagnosed in June 2025, completed SBRT and currently undergoing chemotherapy at the [facility], last chemo 3 weeks ago with next chemo on 12/20 - Imaging on admission shows stable scattered pulmonary nodules, largest 8 mm at the left lung base and metastatic lesions in the T6 and T9 vertebrae - [facility]

## 2024-08-23 NOTE — Progress Notes (Signed)
 Patient ID: Roberto MALVA Nelwyn Chrystal., male   DOB: 06-Apr-1941, 83 y.o.   MRN: [id number]        Chief Complaint: follow up post hospn dec 15 - 18 2025 with constipation, dehydration, 20 lb wt loss, hx of met lung CA and AAA, no longer smoking       HPI:  Roberto Wallace. is a 83 y.o. male here overall doing ok, with much improved constipation, and taking po well..  Pt did restart his BP meds after held at hosp d/c though  Has more hours help at home.  Has had significant wt loss,  Pt prefers to f/u Vascular surgury and Oncology with [facility] soon.   Pt continues to have recurring LBP without change in severity, bowel or bladder change, fever, wt loss,  worsening LE pain/numbness/weakness, gait change or falls.  Has known hx of lumbar djd ddd, L3 compression fx and possible malignant mets.    Wt Readings from Last 3 Encounters:  08/23/24 101 lb (45.8 kg)  08/08/24 110 lb (49.9 kg)  06/20/24 112 lb (50.8 kg)   BP Readings from Last 3 Encounters:  08/23/24 124/76  08/11/24 100/75  07/04/24 138/86         Past Medical History:  Diagnosis Date   Aneurysm    per pt,in head   Arthritis    Cancer (HCC)    Chicken pox    Coronary artery disease    Depression    Hypercholesteremia    Hypertension    Migraine    Peripheral vascular disease    PTSD (post-traumatic stress disorder) 06/24/2017   Stroke (HCC)    No residual effects   Syphilis    Tuberculosis    Vitamin D  deficiency    Past Surgical History:  Procedure Laterality Date   BUBBLE STUDY  08/15/2021   Procedure: BUBBLE STUDY;  Surgeon: Jeffrie Oneil BROCKS, MD;  Location: [facility] ENDOSCOPY;  Service: Cardiovascular;;   COLONOSCOPY     ENDOBRONCHIAL ULTRASOUND Left 02/16/2024   Procedure: ENDOBRONCHIAL ULTRASOUND (EBUS);  Surgeon: Shelah Lamar RAMAN, MD;  Location: [facility] ENDOSCOPY;  Service: Pulmonary;  Laterality: Left;   HERNIA REPAIR Right    x 2   TEE WITHOUT CARDIOVERSION N/A 08/15/2021   Procedure: TRANSESOPHAGEAL  ECHOCARDIOGRAM (TEE);  Surgeon: Jeffrie Oneil BROCKS, MD;  Location: [facility] ENDOSCOPY;  Service: Cardiovascular;  Laterality: N/A;   VIDEO BRONCHOSCOPY WITH ENDOBRONCHIAL NAVIGATION Left 02/16/2024   Procedure: VIDEO BRONCHOSCOPY WITH ENDOBRONCHIAL NAVIGATION;  Surgeon: Shelah Lamar RAMAN, MD;  Location: [facility] ENDOSCOPY;  Service: Pulmonary;  Laterality: Left;   VIDEO BRONCHOSCOPY WITH ENDOBRONCHIAL NAVIGATION Left 06/13/2024   Procedure: VIDEO BRONCHOSCOPY WITH ENDOBRONCHIAL NAVIGATION;  Surgeon: Shelah Lamar RAMAN, MD;  Location: [facility] ENDOSCOPY;  Service: Pulmonary;  Laterality: Left;  Needs EBUS scope also   VIDEO BRONCHOSCOPY WITH ENDOBRONCHIAL ULTRASOUND N/A 06/13/2024   Procedure: BRONCHOSCOPY, WITH EBUS;  Surgeon: Shelah Lamar RAMAN, MD;  Location: [facility] ENDOSCOPY;  Service: Pulmonary;  Laterality: N/A;    reports that he has quit smoking. His smoking use included cigarettes. He started smoking about 52 years ago. He has a 26.4 pack-year smoking history. He has never used smokeless tobacco. He reports that he does not currently use alcohol after a past usage of about 2.0 - 3.0 standard drinks of alcohol per week. He reports that he does not use drugs. family history includes Dementia in his mother; Healthy in his father; Prostate cancer in his brother. Allergies[1] Medications Ordered Prior to Encounter[2]  ROS:  All others reviewed and negative.  Objective        PE:  BP 124/76 (BP Location: Left Arm, Patient Position: Sitting, Cuff Size: Normal)   Pulse 95   Temp 97.6 F (36.4 C) (Oral)   Ht 5' 9 (1.753 m)   Wt 101 lb (45.8 kg)   SpO2 99%   BMI 14.92 kg/m                 Constitutional: Pt appears in NAD               HENT: Head: NCAT.                Right Ear: External ear normal.                 Left Ear: External ear normal.                Eyes: . Pupils are equal, round, and reactive to light. Conjunctivae and EOM are normal               Nose: without d/c or deformity               Neck: Neck  supple. Gross normal ROM               Cardiovascular: Normal rate and regular rhythm.                 Pulmonary/Chest: Effort normal and breath sounds without rales or wheezing.                Abd:  Soft, NT, ND, + BS, no organomegaly               Neurological: Pt is alert. At baseline orientation, motor grossly intact               Skin: Skin is warm. No rashes, no other new lesions, LE edema - none               Psychiatric: Pt behavior is normal without agitation   Micro: none  Cardiac tracings I have personally interpreted today:  none  Pertinent Radiological findings (summarize): none   Lab Results  Component Value Date   WBC 7.9 08/10/2024   HGB 9.1 (L) 08/10/2024   HCT 28.7 (L) 08/10/2024   PLT 237 08/10/2024   GLUCOSE 97 08/10/2024   CHOL 162 01/14/2024   TRIG 115.0 01/14/2024   HDL 42.50 01/14/2024   LDLCALC 96 01/14/2024   ALT 22 08/08/2024   AST 33 08/08/2024   NA 136 08/10/2024   K 4.1 08/10/2024   CL 105 08/10/2024   CREATININE 1.03 08/10/2024   BUN 20 08/10/2024   CO2 26 08/10/2024   TSH 0.62 01/14/2024   PSA 2.77 12/12/2020   INR 0.99 10/16/2017   HGBA1C 5.7 01/14/2024   Assessment/Plan:  Roberto WHOBREY [name] is a 83 y.o. Black or African American [2] male with  has a past medical history of Aneurysm, Arthritis, Cancer (HCC), Chicken pox, Coronary artery disease, Depression, Hypercholesteremia, Hypertension, Migraine, Peripheral vascular disease, PTSD (post-traumatic stress disorder) (06/24/2017), Stroke (HCC), Syphilis, Tuberculosis, and Vitamin D  deficiency.  Constipation Much improved, pt to continue bowel regimen daily  GEN OSTEOARTHROSIS INVOLVING MULTIPLE SITES With uncontrolled pain worsen to the lower back as well, pt requests mobic  prn as this has recently helped  AAA (abdominal aortic aneurysm) Pt states will arrange for f/u vascular surgury at the [facility]  Non-small cell lung cancer with metastasis (HCC) Pt states will fu with  oncology at [facility] soon, as his is preference  Followup: Return in about 6 months (around 02/21/2025).  Lynwood Rush, MD 08/23/2024 12:58 PM [facility] Internal Medicine     [1]  Allergies Allergen Reactions   Amlodipine Rash   Brinzolamide-Brimonidine Other (See Comments)    Not allergic per pt.    Pollen Extract Other (See Comments)    Sinusitis  [2]  Current Outpatient Medications on File Prior to Visit  Medication Sig Dispense Refill   feeding supplement (ENSURE PLUS HIGH PROTEIN) LIQD Take 237 mLs by mouth 2 (two) times daily between meals. (Patient not taking: Reported on 08/15/2024) 14220 mL 2   lidocaine  (LIDODERM ) 5 % Place 1 patch onto the skin daily. Remove & Discard patch within 12 hours or as directed by MD (Patient not taking: Reported on 08/15/2024) 30 patch 2   magnesium  gluconate (MAGONATE) 500 (27 Mg) MG TABS tablet Take 1 tablet (500 mg total) by mouth daily. 30 tablet 0   memantine  (NAMENDA ) 5 MG tablet Take 1 tablet (5 mg total) by mouth 2 (two) times daily. 60 tablet 1   methocarbamol  (ROBAXIN ) 500 MG tablet Take 1 tablet (500 mg total) by mouth 3 (three) times daily. 90 tablet 1   morphine  (MS CONTIN ) 15 MG 12 hr tablet Take 1 tablet (15 mg total) by mouth every 12 (twelve) hours. 60 tablet 0   oxyCODONE  (OXY IR/ROXICODONE ) 5 MG immediate release tablet Take 1 tablet (5 mg total) by mouth every 6 (six) hours as needed for severe pain (pain score 7-10) or breakthrough pain. 30 tablet 0   polyethylene glycol powder (GLYCOLAX /MIRALAX ) 17 GM/SCOOP powder Take 17 g by mouth daily. Dissolve 1 capful (17g) in 4-8 ounces of liquid and take by mouth daily. 238 g 0   senna-docusate (SENOKOT-S) 8.6-50 MG tablet Take 2 tablets by mouth at bedtime. 60 tablet 0   tamsulosin  (FLOMAX ) 0.4 MG CAPS capsule Take 0.4 mg by mouth at bedtime. (Patient not taking: Reported on 08/15/2024)     No current facility-administered  medications on file prior to visit.

## 2024-08-23 NOTE — Patient Instructions (Signed)
 Please take all new medication as prescribed- the meloxicam  for pain as needed  Please continue all other medications as before, including the Blood Pressure med you have already restarted  Please have the pharmacy call with any other refills you may need.  Please continue your efforts at being more active, low cholesterol diet  Please keep your appointments with your specialists as you may have planned - Vascular Surgury and Oncology at [facility]  We can hold on lab testing today  Please make an Appointment to return in 6 months, or sooner if needed

## 2024-08-23 NOTE — Assessment & Plan Note (Signed)
 Pt states will arrange for f/u vascular surgury at the [facility]

## 2024-08-23 NOTE — Assessment & Plan Note (Signed)
 With uncontrolled pain worsen to the lower back as well, pt requests mobic  prn as this has recently helped

## 2024-08-26 ENCOUNTER — Telehealth: Payer: Self-pay

## 2024-08-26 NOTE — Telephone Encounter (Signed)
 Copied from CRM [id number]. Topic: Clinical - Home Health Verbal Orders >> Aug 26, 2024 10:25 AM Montie POUR wrote: Caller/Agency: Randine with [facility] Number: [phone] - Secured Voicemail  Service Requested: Verbal order for Palliative Care Frequency: None  Any new concerns about the patient? No

## 2024-08-26 NOTE — Telephone Encounter (Signed)
 Ok for [employer]

## 2024-08-26 NOTE — Telephone Encounter (Signed)
 Called and left voicemail giving verbals.

## 2024-09-08 ENCOUNTER — Telehealth: Payer: Self-pay

## 2024-09-08 NOTE — Telephone Encounter (Signed)
 Copied from CRM #[id number]. Topic: Appointments - Appointment Info/Confirmation >> Sep 08, 2024  1:03 PM Charolett L wrote: Patient called in and requested for a letter to be sent to him as a reminder for the appt on 04/17 for his AWV

## 2024-09-14 ENCOUNTER — Telehealth: Payer: Self-pay | Admitting: Radiation Oncology

## 2024-09-14 ENCOUNTER — Inpatient Hospital Stay
Admission: RE | Admit: 2024-09-14 | Discharge: 2024-09-14 | Disposition: A | Payer: Self-pay | Source: Ambulatory Visit | Attending: Radiation Oncology | Admitting: Radiation Oncology

## 2024-09-14 ENCOUNTER — Other Ambulatory Visit: Payer: Self-pay | Admitting: Radiation Oncology

## 2024-09-14 DIAGNOSIS — C3410 Malignant neoplasm of upper lobe, unspecified bronchus or lung: Secondary | ICD-10-CM

## 2024-09-14 NOTE — Telephone Encounter (Signed)
 1/21 sent via stat fax requested for recent MRI images to be pushed to [facility] from [facility].  Also called [facility], so they aware.  Waiting on images.

## 2024-09-14 NOTE — Progress Notes (Incomplete)
 Thoracic Location of Tumor / Histology: Left lower lobe of lung  Patient presented 6 months ago with symptoms of: dyspnea  Biopsies of LLL of lung (if applicable) revealed:  Cytology 06/13/2024 A. LUNG, LLL, FINE NEEDLE ASPIRATION  BIOPSY:  - No malignant cells identified  - Scantly cellular    FINAL MICROSCOPIC DIAGNOSIS:  B. LYMPH NODE, 4R, FINE NEEDLE ASPIRATION:  - Positive for malignancy. Adenocarcinoma (see comment).   C. LYMPH NODE, 11R, FINE NEEDLE ASPIRATION:  - No malignant cells identified. Lymphocytes and benign bronchial cells.   D. LYMPH NODE, STATION 7, FINE NEEDLE ASPIRATION:  - Positive for malignancy. Adenocarcinoma (see comment).     Past/Anticipated interventions by pulmonary, if any:  02/16/24 and 06/13/24 Video bronchoscopy with endobronchial navigation, fiducial marker placed, lymph node biopsy  Past/Anticipated interventions by cardiothoracic surgery, if any:   Presented 08/15/21 for surgery, with the diagnosis of ABNORMAL ECHO EVALUATION FOR ASD.   Procedure(s): TRANSESOPHAGEAL ECHOCARDIOGRAM (TEE) (N/A) BUBBLE STUDY as a surgical intervention     Past/Anticipated interventions by medical oncology, if any: none   Tobacco/Marijuana/Snuff/ETOH use: yes, former smoker- no drug or alcohol use  Signs/Symptoms Weight changes, if any: {:18581} Respiratory complaints, if any: yes Hemoptysis, if any: {:18581} Pain issues, if any:  {:18581}  SAFETY ISSUES: Prior radiation? Yes, left lung (03/21/24- 03/31/24) Pacemaker/ICD? {:18581}  Possible current pregnancy? No, male Is the patient on methotrexate? no  Current Complaints / other details:  ***

## 2024-09-14 NOTE — Telephone Encounter (Signed)
 1/21 [facility] while patient's was in their office to schedule patient for follow up consult and to setup for transportation.  Already received referral and authorization.  Patient now sch as requested.

## 2024-09-14 NOTE — Telephone Encounter (Signed)
 1/21 Follow up to [facility], spoke to [facility], working on transfer images to PACs in epic.  Waiting on images.

## 2024-09-15 ENCOUNTER — Ambulatory Visit
Admission: RE | Admit: 2024-09-15 | Discharge: 2024-09-15 | Disposition: A | Discharge status: Home / Self Care | Source: Ambulatory Visit | Attending: Radiation Oncology | Admitting: Radiation Oncology

## 2024-09-15 VITALS — BP 147/89 | HR 91 | Temp 97.6°F | Resp 20 | Ht 69.0 in

## 2024-09-15 DIAGNOSIS — G893 Neoplasm related pain (acute) (chronic): Secondary | ICD-10-CM

## 2024-09-15 DIAGNOSIS — C3490 Malignant neoplasm of unspecified part of unspecified bronchus or lung: Secondary | ICD-10-CM

## 2024-09-15 MED ORDER — ACETAMINOPHEN 325 MG PO TABS: 650.0000 mg | ORAL_TABLET | Freq: Four times a day (QID) | ORAL | Status: DC | PRN

## 2024-09-15 MED ORDER — ACETAMINOPHEN 325 MG PO TABS
ORAL_TABLET | ORAL | Status: AC
  Filled 2024-09-15: qty 2

## 2024-09-15 MED ORDER — ACETAMINOPHEN 325 MG PO TABS
650.0000 mg | ORAL_TABLET | Freq: Once | ORAL | Status: AC
  Administered 2024-09-15: 650 mg via ORAL

## 2024-09-15 NOTE — Progress Notes (Signed)
 " Radiation Oncology         [phone] ________________________________   Outpatient Reconsult  Name: Roberto KRONBERG [name]        MRN: [id number]  Date of Service: 09/15/2024 DOB: 20-Feb-1941  RR:Gnyw, Lynwood ORN, MD  Plante, Earnie Cone, MD     REFERRING PHYSICIAN: Kenton Earnie Cone, MD   DIAGNOSIS: The encounter diagnosis was Non-small cell lung cancer with metastasis [facility]).   HISTORY OF PRESENT ILLNESS: Roberto Wallace. is a 84 y.o. male with a recent diagnosis of lung cancer.  The patient was being followed through the lung cancer screening clinic at the [facility] and was found to have a nodule in the left upper lobe, this prompted additional workup with a PET scan which was performed in our system on 01/21/2024 and confirmed a nodule in the lateral aspect of the left upper lobe measuring approximately 17 mm new from 2024 with an SUV of 6.9.  There was a small focus of asymmetric uptake in the left hilum with an SUV of 3.7 but felt to be nonspecific there was also minimal uptake as well on the left hilum.  In addition to that there was also concern for a area of uptake in the inferior aspect of the left kidney with an SUV of 13.7, it was concerning for a renal carcinoma.  An enlarged prostate was noted as well as a 4.1 cm abdominal aortic aneurysm.  He underwent bronchoscopy with Dr. Shelah on 02/16/2024 which showed adenocarcinoma in the left upper lobe brushing and fine-needle aspirate and biopsy, and the station 7 lymph node was negative for disease as well as the 4L lymph node station.  Given these findings he has been seen to consider stereotactic body radiation as he was not felt to be a medically fit candidate for surgery.   He is also working with medical oncology at the [facility] and they are aware of his renal finding.    He presented for post treatment CT on 05/17/24. This showed improvement in the LUL nodule, measuring 13 mm in greatest dimension, and persistent multiple calcified  nodules. There was a new nodule in the subpleural LLL measuring 8 mm which was new in comparison to his prior imaging. In addition there was progressive enlargement in the precarinal node measuring 2.2 x 2.6 cm, and in the left hilum measuring 1.6 x 2.6 cm as well as other enlarged nodes. Bilateral renal cysts were noted and a stable left adrenal nodule was noted. A PET on 06/01/24 showed concerns for the left hilar adenopathy and right lower paratracheal node as well as new osseous disease in the L3 vertebral body, left femoral neck, right sacral ala, mid thoracic spine, and probable left posterior second rib. He then underwent repeat bronchoscopy with EBUS on 06/13/24 and the 4R node showed adenocarcinoma, the 11R node showed no malignancy, and the station 7 node showed adenocarcinoma, though no malignant cells were seen in in the FNA of the LLL nodule though the specimen was scant. His molecular studies showed the tumor was positive for PDL1 expression and [facility] and began systemic atezolizumab in November 2025.   He had an MRI of the thoracic spine on 08/03/24 that showed disease at T6 and T8 with superior endplate compression fracture with less than 10% of loss of vertebral body height and no retropulsion. Probable new subcarinal nodal enlargement was also noted, and he completed 2 cycles of immunotherapy. He's seen to  consider palliative radiation while taking a break from systemic therapy due to performance status. He will have hospice care involved for pain management and ongoing care if he does not resume immunotherapy. He's seen today to discuss palliative radiation due to pain from his bony disease.   PREVIOUS RADIATION THERAPY:   03/21/24-03/31/24 SBRT Treatment Plan Name: Lung_L_SBRT Site: Lung, LUL Technique: SBRT/SRT-IMRT Mode: Photon Dose Per Fraction: 12 Gy Prescribed Dose (Delivered / Prescribed): 60 Gy / 60 Gy Prescribed Fxs (Delivered /  Prescribed): 5 / 5  PAST MEDICAL HISTORY:  Past Medical History:  Diagnosis Date   Aneurysm    per pt,in head   Arthritis    Cancer (HCC)    Chicken pox    Coronary artery disease    Depression    Hypercholesteremia    Hypertension    Migraine    Peripheral vascular disease    PTSD (post-traumatic stress disorder) 06/24/2017   Stroke (HCC)    No residual effects   Syphilis    Tuberculosis    Vitamin D  deficiency        PAST SURGICAL HISTORY: Past Surgical History:  Procedure Laterality Date   BUBBLE STUDY  08/15/2021   Procedure: BUBBLE STUDY;  Surgeon: Jeffrie Oneil BROCKS, MD;  Location: [facility] ENDOSCOPY;  Service: Cardiovascular;;   COLONOSCOPY     ENDOBRONCHIAL ULTRASOUND Left 02/16/2024   Procedure: ENDOBRONCHIAL ULTRASOUND (EBUS);  Surgeon: Shelah Lamar RAMAN, MD;  Location: [facility] ENDOSCOPY;  Service: Pulmonary;  Laterality: Left;   HERNIA REPAIR Right    x 2   TEE WITHOUT CARDIOVERSION N/A 08/15/2021   Procedure: TRANSESOPHAGEAL ECHOCARDIOGRAM (TEE);  Surgeon: Jeffrie Oneil BROCKS, MD;  Location: [facility] ENDOSCOPY;  Service: Cardiovascular;  Laterality: N/A;   VIDEO BRONCHOSCOPY WITH ENDOBRONCHIAL NAVIGATION Left 02/16/2024   Procedure: VIDEO BRONCHOSCOPY WITH ENDOBRONCHIAL NAVIGATION;  Surgeon: Shelah Lamar RAMAN, MD;  Location: [facility] ENDOSCOPY;  Service: Pulmonary;  Laterality: Left;   VIDEO BRONCHOSCOPY WITH ENDOBRONCHIAL NAVIGATION Left 06/13/2024   Procedure: VIDEO BRONCHOSCOPY WITH ENDOBRONCHIAL NAVIGATION;  Surgeon: Shelah Lamar RAMAN, MD;  Location: [facility] ENDOSCOPY;  Service: Pulmonary;  Laterality: Left;  Needs EBUS scope also   VIDEO BRONCHOSCOPY WITH ENDOBRONCHIAL ULTRASOUND N/A 06/13/2024   Procedure: BRONCHOSCOPY, WITH EBUS;  Surgeon: Shelah Lamar RAMAN, MD;  Location: [facility] ENDOSCOPY;  Service: Pulmonary;  Laterality: N/A;     FAMILY HISTORY:  Family History  Problem Relation Age of Onset   Dementia Mother    Healthy Father    Prostate cancer Brother      SOCIAL HISTORY:  reports that he  has quit smoking. His smoking use included cigarettes. He started smoking about 52 years ago. He has a 26.4 pack-year smoking history. He has never used smokeless tobacco. He reports that he does not currently use alcohol after a past usage of about 2.0 - 3.0 standard drinks of alcohol per week. He reports that he does not use drugs. The patient is widowed and lives in [place]. He has adult children who live out of state.    ALLERGIES: Amlodipine, Brinzolamide-brimonidine, and Pollen extract   MEDICATIONS:  Current Outpatient Medications  Medication Sig Dispense Refill   acetaminophen  (TYLENOL ) 500 MG tablet Take 1,000 mg by mouth.     dexamethasone  (DECADRON ) 4 MG tablet Take 4 mg by mouth 2 (two) times daily.     LORazepam (ATIVAN) 0.5 MG tablet Take 0.5 mg by mouth every 4 (four) hours as needed.     feeding supplement (ENSURE PLUS HIGH PROTEIN) LIQD Take 237  mLs by mouth 2 (two) times daily between meals. (Patient not taking: Reported on 08/15/2024) 14220 mL 2   lidocaine  (LIDODERM ) 5 % Place 1 patch onto the skin daily. Remove & Discard patch within 12 hours or as directed by MD (Patient not taking: Reported on 08/15/2024) 30 patch 2   magnesium  gluconate (MAGONATE) 500 (27 Mg) MG TABS tablet Take 1 tablet (500 mg total) by mouth daily. 30 tablet 0   meloxicam  (MOBIC ) 15 MG tablet Take 1 tablet (15 mg total) by mouth daily as needed. 90 tablet 1   memantine  (NAMENDA ) 5 MG tablet Take 1 tablet (5 mg total) by mouth 2 (two) times daily. 60 tablet 1   methocarbamol  (ROBAXIN ) 500 MG tablet Take 1 tablet (500 mg total) by mouth 3 (three) times daily. 90 tablet 1   morphine  (MS CONTIN ) 15 MG 12 hr tablet Take 1 tablet (15 mg total) by mouth every 12 (twelve) hours. 60 tablet 0   oxyCODONE  (OXY IR/ROXICODONE ) 5 MG immediate release tablet Take 1 tablet (5 mg total) by mouth every 6 (six) hours as needed for severe pain (pain score 7-10) or breakthrough pain. 30 tablet 0   polyethylene glycol  powder (GLYCOLAX /MIRALAX ) 17 GM/SCOOP powder Take 17 g by mouth daily. Dissolve 1 capful (17g) in 4-8 ounces of liquid and take by mouth daily. 238 g 0   senna-docusate (SENOKOT-S) 8.6-50 MG tablet Take 2 tablets by mouth at bedtime. 60 tablet 0   tamsulosin  (FLOMAX ) 0.4 MG CAPS capsule Take 0.4 mg by mouth at bedtime. (Patient not taking: Reported on 08/15/2024)     No current facility-administered medications for this encounter.     REVIEW OF SYSTEMS: On review of systems, the patient reports he has been having terrible pain in his low back and mid spine between the shoulder blades for several months but this has progressed and become more constant in the last month. He denies any loss of sensation, control of bowel or bladder habits, or weakness. He has been working with hospice care through the [facility] for the last two weeks and is using Oxycodone , long acting morphine , and tylenol . He requests a dose of tylenol  today since he has not taken any medication since this morning. He reports he is often short of breath when talking on the phone, but findings if he can rest and not have to talk this improves. No other complaints are verbalized.   PHYSICAL EXAM:  Unable to assess due to encounter type   ECOG =0  0 - Asymptomatic (Fully active, able to carry on all predisease activities without restriction)  1 - Symptomatic but completely ambulatory (Restricted in physically strenuous activity but ambulatory and able to carry out work of a light or sedentary nature. For example, light housework, office work)  2 - Symptomatic, <50% in bed during the day (Ambulatory and capable of all self care but unable to carry out any work activities. Up and about more than 50% of waking hours)  3 - Symptomatic, >50% in bed, but not bedbound (Capable of only limited self-care, confined to bed or chair 50% or more of waking hours)  4 - Bedbound (Completely disabled. Cannot carry on any self-care. Totally confined to  bed or chair)  5 - Death   Raylene MM, Creech RH, Tormey DC, et al. [phone]). Toxicity and response criteria of the [facility]. Am. DOROTHA Bridges. Oncol. 5 (6): 649-55    LABORATORY DATA:  Lab Results  Component Value Date  WBC 7.9 08/10/2024   HGB 9.1 (L) 08/10/2024   HCT 28.7 (L) 08/10/2024   MCV 84.7 08/10/2024   PLT 237 08/10/2024   Lab Results  Component Value Date   NA 136 08/10/2024   K 4.1 08/10/2024   CL 105 08/10/2024   CO2 26 08/10/2024   Lab Results  Component Value Date   ALT 22 08/08/2024   AST 33 08/08/2024   ALKPHOS 74 08/08/2024   BILITOT 1.1 08/08/2024      RADIOGRAPHY: No results found.      IMPRESSION/PLAN: 1. Metastatic Progressive Stage IA2, cT1bN0M0, NSCLC, adenocarcinoma of the LUL involving the mediastinum and bones.  Unfortunately the patient's course has continued to progress. He has been receiving palliative immunotherapy, but his pain in his spine has been significant and his systemic therapy is on hold. The plan is to enroll in hospice care if he does not have improvement in performance status in order to resume immunotherapy. Dr. Dewey offers a course of 10 fractions of palliative radiotherapy to the L3, and T6-8 spine, as well as the right sacrum. We discussed the risks, benefits, short, and long term effects of therapy and the patient is interested in proceeding. Written consent is obtained and placed in the chart, a copy was provided to the patient. The patient will be contacted to coordinate treatment planning by our simulation department.  2. Left Renal Hypermetabolism. [facility] aware [facility].   In a visit lasting 45 minutes, greater than 50% of the time was spent face to face discussing the patient's condition, in preparation for the discussion, and coordinating the patient's care.   The above documentation reflects my direct findings during this shared patient visit. Please see the separate note by  Dr. Dewey on this date for the remainder of the patient's plan of care.     [name], [facility]     **Disclaimer: This note was dictated with voice recognition software. Similar sounding words can inadvertently be transcribed and this note may contain transcription errors which may not have been corrected upon publication of note.** "

## 2024-09-21 ENCOUNTER — Ambulatory Visit
Admission: RE | Admit: 2024-09-21 | Discharge: 2024-09-21 | Disposition: A | Discharge status: Home / Self Care | Source: Ambulatory Visit | Attending: Radiation Oncology | Admitting: Radiation Oncology

## 2024-09-21 DIAGNOSIS — Z510 Encounter for antineoplastic radiation therapy: Secondary | ICD-10-CM | POA: Insufficient documentation

## 2024-09-21 DIAGNOSIS — C3412 Malignant neoplasm of upper lobe, left bronchus or lung: Secondary | ICD-10-CM | POA: Diagnosis present

## 2024-09-28 ENCOUNTER — Other Ambulatory Visit: Payer: Self-pay

## 2024-09-28 ENCOUNTER — Ambulatory Visit
Admission: RE | Admit: 2024-09-28 | Discharge: 2024-09-28 | Disposition: A | Source: Ambulatory Visit | Attending: Radiation Oncology | Admitting: Radiation Oncology

## 2024-09-28 LAB — RAD ONC ARIA SESSION SUMMARY
Course Elapsed Days: 0
Plan Fractions Treated to Date: 1
Plan Fractions Treated to Date: 1
Plan Fractions Treated to Date: 1
Plan Prescribed Dose Per Fraction: 3 Gy
Plan Prescribed Dose Per Fraction: 3 Gy
Plan Prescribed Dose Per Fraction: 3 Gy
Plan Total Fractions Prescribed: 10
Plan Total Fractions Prescribed: 10
Plan Total Fractions Prescribed: 10
Plan Total Prescribed Dose: 30 Gy
Plan Total Prescribed Dose: 30 Gy
Plan Total Prescribed Dose: 30 Gy
Reference Point Dosage Given to Date: 3 Gy
Reference Point Dosage Given to Date: 3 Gy
Reference Point Dosage Given to Date: 3 Gy
Reference Point Session Dosage Given: 3 Gy
Reference Point Session Dosage Given: 3 Gy
Reference Point Session Dosage Given: 3 Gy
Session Number: 1

## 2024-09-29 ENCOUNTER — Ambulatory Visit
Admission: RE | Admit: 2024-09-29 | Discharge: 2024-09-29 | Disposition: A | Source: Ambulatory Visit | Attending: Radiation Oncology

## 2024-09-29 ENCOUNTER — Other Ambulatory Visit: Payer: Self-pay

## 2024-09-29 LAB — RAD ONC ARIA SESSION SUMMARY
Course Elapsed Days: 1
Plan Fractions Treated to Date: 2
Plan Fractions Treated to Date: 2
Plan Fractions Treated to Date: 2
Plan Prescribed Dose Per Fraction: 3 Gy
Plan Prescribed Dose Per Fraction: 3 Gy
Plan Prescribed Dose Per Fraction: 3 Gy
Plan Total Fractions Prescribed: 10
Plan Total Fractions Prescribed: 10
Plan Total Fractions Prescribed: 10
Plan Total Prescribed Dose: 30 Gy
Plan Total Prescribed Dose: 30 Gy
Plan Total Prescribed Dose: 30 Gy
Reference Point Dosage Given to Date: 6 Gy
Reference Point Dosage Given to Date: 6 Gy
Reference Point Dosage Given to Date: 6 Gy
Reference Point Session Dosage Given: 3 Gy
Reference Point Session Dosage Given: 3 Gy
Reference Point Session Dosage Given: 3 Gy
Session Number: 2

## 2024-09-30 ENCOUNTER — Ambulatory Visit: Admission: RE | Admit: 2024-09-30 | Source: Ambulatory Visit

## 2024-09-30 ENCOUNTER — Ambulatory Visit: Admission: RE | Admit: 2024-09-30

## 2024-09-30 ENCOUNTER — Other Ambulatory Visit: Payer: Self-pay

## 2024-09-30 LAB — RAD ONC ARIA SESSION SUMMARY
Course Elapsed Days: 2
Plan Fractions Treated to Date: 3
Plan Fractions Treated to Date: 3
Plan Fractions Treated to Date: 3
Plan Prescribed Dose Per Fraction: 3 Gy
Plan Prescribed Dose Per Fraction: 3 Gy
Plan Prescribed Dose Per Fraction: 3 Gy
Plan Total Fractions Prescribed: 10
Plan Total Fractions Prescribed: 10
Plan Total Fractions Prescribed: 10
Plan Total Prescribed Dose: 30 Gy
Plan Total Prescribed Dose: 30 Gy
Plan Total Prescribed Dose: 30 Gy
Reference Point Dosage Given to Date: 9 Gy
Reference Point Dosage Given to Date: 9 Gy
Reference Point Dosage Given to Date: 9 Gy
Reference Point Session Dosage Given: 3 Gy
Reference Point Session Dosage Given: 3 Gy
Reference Point Session Dosage Given: 3 Gy
Session Number: 3

## 2024-10-03 ENCOUNTER — Ambulatory Visit

## 2024-10-04 ENCOUNTER — Ambulatory Visit

## 2024-10-05 ENCOUNTER — Ambulatory Visit

## 2024-10-06 ENCOUNTER — Ambulatory Visit

## 2024-10-07 ENCOUNTER — Ambulatory Visit

## 2024-10-10 ENCOUNTER — Ambulatory Visit

## 2024-10-11 ENCOUNTER — Ambulatory Visit

## 2024-12-09 ENCOUNTER — Ambulatory Visit
